# Patient Record
Sex: Male | Born: 1945 | Race: White | Hispanic: No | Marital: Married | Smoking: Never smoker
Health system: Southern US, Community
[De-identification: ages and names within clinical notes are randomized; demographics above are authoritative.]

## PROBLEM LIST (undated history)

## (undated) ENCOUNTER — Emergency Department (HOSPITAL_COMMUNITY): Admission: EM | Payer: No Typology Code available for payment source | Source: Home / Self Care

## (undated) DIAGNOSIS — M545 Low back pain, unspecified: Secondary | ICD-10-CM

## (undated) DIAGNOSIS — E7800 Pure hypercholesterolemia, unspecified: Secondary | ICD-10-CM

## (undated) DIAGNOSIS — R2689 Other abnormalities of gait and mobility: Secondary | ICD-10-CM

## (undated) DIAGNOSIS — F419 Anxiety disorder, unspecified: Secondary | ICD-10-CM

## (undated) DIAGNOSIS — G20A1 Parkinson's disease without dyskinesia, without mention of fluctuations: Secondary | ICD-10-CM

## (undated) DIAGNOSIS — L0232 Furuncle of buttock: Secondary | ICD-10-CM

## (undated) HISTORY — DX: Anxiety disorder, unspecified: F41.9

## (undated) HISTORY — DX: Low back pain, unspecified: M54.50

## (undated) HISTORY — DX: Other abnormalities of gait and mobility: R26.89

## (undated) HISTORY — DX: Furuncle of buttock: L02.32

---

## 2004-12-05 LAB — CONVERTED CEMR LAB: PSA: 0.99 ng/mL

## 2006-06-30 ENCOUNTER — Ambulatory Visit: Payer: Self-pay | Admitting: Family Medicine

## 2006-06-30 DIAGNOSIS — M545 Low back pain, unspecified: Secondary | ICD-10-CM | POA: Insufficient documentation

## 2006-06-30 DIAGNOSIS — K5730 Diverticulosis of large intestine without perforation or abscess without bleeding: Secondary | ICD-10-CM | POA: Insufficient documentation

## 2006-06-30 DIAGNOSIS — E785 Hyperlipidemia, unspecified: Secondary | ICD-10-CM | POA: Insufficient documentation

## 2006-06-30 DIAGNOSIS — L821 Other seborrheic keratosis: Secondary | ICD-10-CM | POA: Insufficient documentation

## 2006-06-30 LAB — CONVERTED CEMR LAB: Cholesterol, target level: 200 mg/dL

## 2006-09-11 ENCOUNTER — Ambulatory Visit: Payer: Self-pay | Admitting: Family Medicine

## 2006-09-11 ENCOUNTER — Ambulatory Visit (HOSPITAL_COMMUNITY): Admission: RE | Admit: 2006-09-11 | Discharge: 2006-09-11 | Payer: Self-pay | Admitting: Family Medicine

## 2006-09-12 ENCOUNTER — Encounter (INDEPENDENT_AMBULATORY_CARE_PROVIDER_SITE_OTHER): Payer: Self-pay | Admitting: Family Medicine

## 2006-09-12 DIAGNOSIS — D568 Other thalassemias: Secondary | ICD-10-CM | POA: Insufficient documentation

## 2006-09-12 LAB — CONVERTED CEMR LAB
ALT: 22 units/L (ref 0–53)
AST: 12 units/L (ref 0–37)
Alkaline Phosphatase: 77 units/L (ref 39–117)
Basophils Absolute: 0 10*3/uL (ref 0.0–0.1)
Basophils Relative: 1 % (ref 0–1)
Eosinophils Absolute: 0.1 10*3/uL (ref 0.0–0.7)
Eosinophils Relative: 2 % (ref 0–5)
HCT: 34.6 % — ABNORMAL LOW (ref 39.0–52.0)
LDL Cholesterol: 100 mg/dL — ABNORMAL HIGH (ref 0–99)
Lymphocytes Relative: 35 % (ref 12–46)
MCV: 62.8 fL — ABNORMAL LOW (ref 78.0–100.0)
PSA: 0.85 ng/mL (ref 0.10–4.00)
Platelets: 198 10*3/uL (ref 150–400)
RDW: 16 % — ABNORMAL HIGH (ref 11.5–14.0)
Sodium: 141 meq/L (ref 135–145)
Total Bilirubin: 0.9 mg/dL (ref 0.3–1.2)
Total Protein: 7.4 g/dL (ref 6.0–8.3)
VLDL: 19 mg/dL (ref 0–40)

## 2006-09-23 ENCOUNTER — Ambulatory Visit: Payer: Self-pay | Admitting: Family Medicine

## 2006-09-23 DIAGNOSIS — T61781A Other shellfish poisoning, accidental (unintentional), initial encounter: Secondary | ICD-10-CM | POA: Insufficient documentation

## 2006-09-23 DIAGNOSIS — T6191XA Toxic effect of unspecified seafood, accidental (unintentional), initial encounter: Secondary | ICD-10-CM | POA: Insufficient documentation

## 2006-09-29 ENCOUNTER — Telehealth (INDEPENDENT_AMBULATORY_CARE_PROVIDER_SITE_OTHER): Payer: Self-pay | Admitting: Family Medicine

## 2006-09-30 ENCOUNTER — Ambulatory Visit: Payer: Self-pay | Admitting: Family Medicine

## 2007-03-17 ENCOUNTER — Ambulatory Visit: Payer: Self-pay | Admitting: Family Medicine

## 2007-03-18 ENCOUNTER — Encounter (INDEPENDENT_AMBULATORY_CARE_PROVIDER_SITE_OTHER): Payer: Self-pay | Admitting: Family Medicine

## 2007-03-18 ENCOUNTER — Telehealth (INDEPENDENT_AMBULATORY_CARE_PROVIDER_SITE_OTHER): Payer: Self-pay | Admitting: *Deleted

## 2007-03-18 LAB — CONVERTED CEMR LAB
ALT: 19 units/L (ref 0–53)
Alkaline Phosphatase: 81 units/L (ref 39–117)
Creatinine, Ser: 1.03 mg/dL (ref 0.40–1.50)
Glucose, Bld: 91 mg/dL (ref 70–99)
LDL Cholesterol: 91 mg/dL (ref 0–99)
Sodium: 140 meq/L (ref 135–145)
Total Bilirubin: 0.5 mg/dL (ref 0.3–1.2)
Total CHOL/HDL Ratio: 4.7
Total Protein: 7.8 g/dL (ref 6.0–8.3)
Triglycerides: 204 mg/dL — ABNORMAL HIGH (ref <150)
VLDL: 41 mg/dL — ABNORMAL HIGH (ref 0–40)

## 2007-10-12 ENCOUNTER — Ambulatory Visit: Payer: Self-pay | Admitting: Family Medicine

## 2007-10-13 ENCOUNTER — Encounter (INDEPENDENT_AMBULATORY_CARE_PROVIDER_SITE_OTHER): Payer: Self-pay | Admitting: Family Medicine

## 2007-10-13 LAB — CONVERTED CEMR LAB
ALT: 13 units/L (ref 0–53)
AST: 8 units/L (ref 0–37)
Albumin: 4.4 g/dL (ref 3.5–5.2)
Basophils Absolute: 0 10*3/uL (ref 0.0–0.1)
CO2: 21 meq/L (ref 19–32)
Calcium: 9.2 mg/dL (ref 8.4–10.5)
Chloride: 106 meq/L (ref 96–112)
Cholesterol: 191 mg/dL (ref 0–200)
Creatinine, Ser: 1 mg/dL (ref 0.40–1.50)
Hemoglobin: 11.3 g/dL — ABNORMAL LOW (ref 13.0–17.0)
Lymphocytes Relative: 33 % (ref 12–46)
Monocytes Absolute: 0.4 10*3/uL (ref 0.1–1.0)
Neutro Abs: 3.1 10*3/uL (ref 1.7–7.7)
Neutrophils Relative %: 58 % (ref 43–77)
PSA: 1.42 ng/mL (ref 0.10–4.00)
Potassium: 4.5 meq/L (ref 3.5–5.3)
RDW: 15.8 % — ABNORMAL HIGH (ref 11.5–15.5)
Sodium: 140 meq/L (ref 135–145)
TSH: 2.665 microintl units/mL (ref 0.350–4.50)
Total Protein: 7.8 g/dL (ref 6.0–8.3)

## 2007-10-20 ENCOUNTER — Telehealth (INDEPENDENT_AMBULATORY_CARE_PROVIDER_SITE_OTHER): Payer: Self-pay | Admitting: Family Medicine

## 2008-02-01 ENCOUNTER — Ambulatory Visit: Payer: Self-pay | Admitting: Family Medicine

## 2008-02-01 DIAGNOSIS — L0201 Cutaneous abscess of face: Secondary | ICD-10-CM | POA: Insufficient documentation

## 2008-02-01 DIAGNOSIS — L03211 Cellulitis of face: Secondary | ICD-10-CM | POA: Insufficient documentation

## 2008-02-04 ENCOUNTER — Telehealth (INDEPENDENT_AMBULATORY_CARE_PROVIDER_SITE_OTHER): Payer: Self-pay | Admitting: *Deleted

## 2008-10-03 ENCOUNTER — Telehealth (INDEPENDENT_AMBULATORY_CARE_PROVIDER_SITE_OTHER): Payer: Self-pay | Admitting: *Deleted

## 2008-11-14 ENCOUNTER — Telehealth (INDEPENDENT_AMBULATORY_CARE_PROVIDER_SITE_OTHER): Payer: Self-pay | Admitting: Family Medicine

## 2008-11-14 ENCOUNTER — Encounter (INDEPENDENT_AMBULATORY_CARE_PROVIDER_SITE_OTHER): Payer: Self-pay | Admitting: *Deleted

## 2008-11-15 ENCOUNTER — Encounter (INDEPENDENT_AMBULATORY_CARE_PROVIDER_SITE_OTHER): Payer: Self-pay | Admitting: Family Medicine

## 2008-11-17 LAB — CONVERTED CEMR LAB
AST: 9 units/L (ref 0–37)
Albumin: 4.4 g/dL (ref 3.5–5.2)
Alkaline Phosphatase: 64 units/L (ref 39–117)
BUN: 19 mg/dL (ref 6–23)
Basophils Absolute: 0 10*3/uL (ref 0.0–0.1)
Calcium: 9.1 mg/dL (ref 8.4–10.5)
Chloride: 104 meq/L (ref 96–112)
Eosinophils Relative: 1 % (ref 0–5)
Glucose, Bld: 87 mg/dL (ref 70–99)
HCT: 35.2 % — ABNORMAL LOW (ref 39.0–52.0)
HDL: 43 mg/dL (ref >39)
Hemoglobin: 10.8 g/dL — ABNORMAL LOW (ref 13.0–17.0)
LDL Cholesterol: 118 mg/dL — ABNORMAL HIGH (ref 0–99)
Lymphocytes Relative: 37 % (ref 12–46)
Lymphs Abs: 2.2 10*3/uL (ref 0.7–4.0)
Monocytes Absolute: 0.5 10*3/uL (ref 0.1–1.0)
Neutro Abs: 3.2 10*3/uL (ref 1.7–7.7)
Potassium: 4.4 meq/L (ref 3.5–5.3)
RBC: 5.47 M/uL (ref 4.22–5.81)
Sodium: 140 meq/L (ref 135–145)
Total Protein: 7.4 g/dL (ref 6.0–8.3)
WBC: 5.9 10*3/uL (ref 4.0–10.5)

## 2012-02-26 HISTORY — PX: CYST REMOVAL LEG: SHX6280

## 2015-11-30 ENCOUNTER — Ambulatory Visit: Payer: Self-pay | Admitting: Surgery

## 2015-11-30 NOTE — H&P (Signed)
History of Present Illness Tony [name]. Tony Noyce MD; 11/30/2015 12:19 PM) Patient words: lipoma.  The patient is a 70 year old male who presents with a complaint of Mass. Referred by Dr. Berneta Sages for left thigh soft tissue mass Derm - Dr. Fontaine No  This is a 70 year old male who presents with a 20 year history of a small subcutaneous mass in the proximal left thigh. This never really bothered him so he left it alone. However over the last year or so, this has enlarged significantly. It causes some mild discomfort. He remains fairly soft to palpation. The patient however, is concerned about the enlargement of the mass. The patient is now referred to discuss excision of the mass for biopsy. No imaging has been performed.   Other Problems (Ammie Eversole, LPN; 624THL QA348G PM) Back Pain Hemorrhoids  Past Surgical History (Ammie Eversole, LPN; 624THL QA348G PM) Oral Surgery  Diagnostic Studies History (Ammie Eversole, LPN; 624THL QA348G PM) Colonoscopy 1-5 years ago  Allergies (Ammie Eversole, LPN; 624THL 579FGE PM) No Known Drug Allergies 11/29/2015  Medication History (Ammie Eversole, LPN; 624THL 579FGE PM) Atorvastatin Calcium (20MG  Tablet, Oral) Active. Medications Reconciled  Social History (Ammie Eversole, LPN; 624THL QA348G PM) Alcohol use Occasional alcohol use. Caffeine use Carbonated beverages. No drug use Tobacco use Never smoker.  Family History (Aleatha Borer, LPN; 624THL QA348G PM) Arthritis Mother. Bleeding disorder Mother. Depression Father. Diabetes Mellitus Family Members In General. Hypertension Father. Kidney Disease Father. Migraine Headache Family Members In General. Thyroid problems Sister.     Review of Systems (Ammie Eversole LPN; 624THL QA348G PM) General Not Present- Appetite Loss, Chills, Fatigue, Fever, Night Sweats, Weight Gain and Weight Loss. Skin Present- Change in Wart/Mole. Not Present- Dryness, Hives,  Jaundice, New Lesions, Non-Healing Wounds, Rash and Ulcer. HEENT Not Present- Earache, Hearing Loss, Hoarseness, Nose Bleed, Oral Ulcers, Ringing in the Ears, Seasonal Allergies, Sinus Pain, Sore Throat, Visual Disturbances, Wears glasses/contact lenses and Yellow Eyes. Respiratory Not Present- Bloody sputum, Chronic Cough, Difficulty Breathing, Snoring and Wheezing. Breast Not Present- Breast Mass, Breast Pain, Nipple Discharge and Skin Changes. Cardiovascular Not Present- Chest Pain, Difficulty Breathing Lying Down, Leg Cramps, Palpitations, Rapid Heart Rate, Shortness of Breath and Swelling of Extremities. Gastrointestinal Not Present- Abdominal Pain, Bloating, Bloody Stool, Change in Bowel Habits, Chronic diarrhea, Constipation, Difficulty Swallowing, Excessive gas, Gets full quickly at meals, Hemorrhoids, Indigestion, Nausea, Rectal Pain and Vomiting. Male Genitourinary Not Present- Blood in Urine, Change in Urinary Stream, Frequency, Impotence, Nocturia, Painful Urination, Urgency and Urine Leakage. Musculoskeletal Present- Back Pain. Not Present- Joint Pain, Joint Stiffness, Muscle Pain, Muscle Weakness and Swelling of Extremities. Neurological Present- Trouble walking. Not Present- Decreased Memory, Fainting, Headaches, Numbness, Seizures, Tingling, Tremor and Weakness. Psychiatric Not Present- Anxiety, Bipolar, Change in Sleep Pattern, Depression, Fearful and Frequent crying. Endocrine Not Present- Cold Intolerance, Excessive Hunger, Hair Changes, Heat Intolerance, Hot flashes and New Diabetes. Hematology Not Present- Blood Thinners, Easy Bruising, Excessive bleeding, Gland problems, HIV and Persistent Infections.  Vitals (Ammie Eversole LPN; 624THL 579FGE PM) 11/29/2015 2:11 PM Weight: 216.2 lb Height: 71.5in Body Surface Area: 2.19 m Body Mass Index: 29.73 kg/m  Temp.: 98.68F(Oral)  Pulse: 90 (Regular)  BP: 130/72 (Sitting, Left Arm, Standard)      Physical Exam  Rodman Key K. Kalisa Girtman MD; 11/30/2015 12:20 PM)  The physical exam findings are as follows: Note:WDWN in NAD Eyes: Pupils equal, round; sclera anicteric HENT: Oral mucosa moist; good dentition Neck: No masses palpated, no thyromegaly Lungs: CTA bilaterally; normal respiratory effort  CV: Regular rate and rhythm; no murmurs; extremities well-perfused with no edema Abd: +bowel sounds, soft, non-tender, no palpable organomegaly; no palpable hernias Skin: Warm, dry; no sign of jaundice Proximal medial left thigh just below grow - 8 x 4 cm protruding subcutaneous soft tissue mass; bilobed; no skin changes; no drainage; no inflammation; smooth, not mobile Psychiatric - alert and oriented x 4; calm mood and affect    Assessment & Plan Rodman Key K. Quartez Lagos MD; 11/29/2015 2:48 PM)  SOFT TISSUE MASS (M79.9) Impression: Proximal medial left thigh - 4 x 8 cm; subcutaneous  Current Plans Schedule for Surgery - Excision of subcutaneous soft tissue mass - proximal left thigh. The surgical procedure has been discussed with the patient. Potential risks, benefits, alternative treatments, and expected outcomes have been explained. All of the patient's questions at this time have been answered. The likelihood of reaching the patient's treatment goal is good. The patient understand the proposed surgical procedure and wishes to proceed.  Tony [name]. Georgette Dover, MD, [facility] Surgery  General/ Trauma Surgery  11/30/2015 12:21 PM

## 2019-12-14 ENCOUNTER — Emergency Department (HOSPITAL_BASED_OUTPATIENT_CLINIC_OR_DEPARTMENT_OTHER): Payer: Medicare Other

## 2019-12-14 ENCOUNTER — Emergency Department (HOSPITAL_BASED_OUTPATIENT_CLINIC_OR_DEPARTMENT_OTHER)
Admission: EM | Admit: 2019-12-14 | Discharge: 2019-12-14 | Disposition: A | Discharge status: Home / Self Care | Payer: Medicare Other | Attending: Emergency Medicine | Admitting: Emergency Medicine

## 2019-12-14 ENCOUNTER — Encounter (HOSPITAL_BASED_OUTPATIENT_CLINIC_OR_DEPARTMENT_OTHER): Payer: Self-pay | Admitting: *Deleted

## 2019-12-14 ENCOUNTER — Other Ambulatory Visit: Payer: Self-pay

## 2019-12-14 DIAGNOSIS — W19XXXA Unspecified fall, initial encounter: Secondary | ICD-10-CM

## 2019-12-14 DIAGNOSIS — E041 Nontoxic single thyroid nodule: Secondary | ICD-10-CM

## 2019-12-14 DIAGNOSIS — Z23 Encounter for immunization: Secondary | ICD-10-CM | POA: Insufficient documentation

## 2019-12-14 DIAGNOSIS — S0081XA Abrasion of other part of head, initial encounter: Secondary | ICD-10-CM | POA: Insufficient documentation

## 2019-12-14 DIAGNOSIS — S161XXA Strain of muscle, fascia and tendon at neck level, initial encounter: Secondary | ICD-10-CM

## 2019-12-14 DIAGNOSIS — W010XXA Fall on same level from slipping, tripping and stumbling without subsequent striking against object, initial encounter: Secondary | ICD-10-CM | POA: Diagnosis not present

## 2019-12-14 DIAGNOSIS — S0083XA Contusion of other part of head, initial encounter: Secondary | ICD-10-CM | POA: Diagnosis not present

## 2019-12-14 DIAGNOSIS — S0990XA Unspecified injury of head, initial encounter: Secondary | ICD-10-CM

## 2019-12-14 DIAGNOSIS — Y9301 Activity, walking, marching and hiking: Secondary | ICD-10-CM | POA: Insufficient documentation

## 2019-12-14 DIAGNOSIS — T148XXA Other injury of unspecified body region, initial encounter: Secondary | ICD-10-CM

## 2019-12-14 DIAGNOSIS — S0031XA Abrasion of nose, initial encounter: Secondary | ICD-10-CM | POA: Insufficient documentation

## 2019-12-14 HISTORY — DX: Pure hypercholesterolemia, unspecified: E78.00

## 2019-12-14 LAB — BASIC METABOLIC PANEL
Anion gap: 10 (ref 5–15)
BUN: 14 mg/dL (ref 8–23)
CO2: 23 mmol/L (ref 22–32)
Calcium: 9.2 mg/dL (ref 8.9–10.3)
Chloride: 102 mmol/L (ref 98–111)
Creatinine, Ser: 0.99 mg/dL (ref 0.61–1.24)
GFR, Estimated: >60 mL/min (ref >60)
Glucose, Bld: 96 mg/dL (ref 70–99)
Potassium: 4.3 mmol/L (ref 3.5–5.1)
Sodium: 135 mmol/L (ref 135–145)

## 2019-12-14 LAB — CBC WITH DIFFERENTIAL/PLATELET
Abs Immature Granulocytes: 0.02 10*3/uL (ref 0.00–0.07)
Basophils Absolute: 0 10*3/uL (ref 0.0–0.1)
Basophils Relative: 0 %
Eosinophils Absolute: 0 10*3/uL (ref 0.0–0.5)
Eosinophils Relative: 1 %
HCT: 35.9 % — ABNORMAL LOW (ref 39.0–52.0)
Hemoglobin: 11.3 g/dL — ABNORMAL LOW (ref 13.0–17.0)
Immature Granulocytes: 0 %
Lymphocytes Relative: 17 %
Lymphs Abs: 1.3 10*3/uL (ref 0.7–4.0)
MCH: 20.1 pg — ABNORMAL LOW (ref 26.0–34.0)
MCHC: 31.5 g/dL (ref 30.0–36.0)
MCV: 63.9 fL — ABNORMAL LOW (ref 80.0–100.0)
Monocytes Absolute: 0.6 10*3/uL (ref 0.1–1.0)
Monocytes Relative: 8 %
Neutro Abs: 5.5 10*3/uL (ref 1.7–7.7)
Neutrophils Relative %: 74 %
Platelets: 193 10*3/uL (ref 150–400)
RBC: 5.62 MIL/uL (ref 4.22–5.81)
RDW: 15.5 % (ref 11.5–15.5)
WBC: 7.4 10*3/uL (ref 4.0–10.5)
nRBC: 0 % (ref 0.0–0.2)

## 2019-12-14 MED ORDER — TETANUS-DIPHTH-ACELL PERTUSSIS 5-2.5-18.5 LF-MCG/0.5 IM SUSP
0.5000 mL | Freq: Once | INTRAMUSCULAR | Status: AC
  Administered 2019-12-14: 0.5 mL via INTRAMUSCULAR
  Filled 2019-12-14: qty 0.5

## 2019-12-14 NOTE — Discharge Instructions (Signed)
You were seen in the emergency department for evaluation of injuries from a fall.  You had a CAT scan of your head and face along with your cervical spine.  There was an incidental finding of a thyroid nodule.  This will need follow-up with your regular doctor.  Please discuss with your doctor stopping your Lexapro.  It potentially as a cause of your dizziness.  Return to the emergency department with any worsening or concerning symptoms

## 2019-12-14 NOTE — ED Provider Notes (Signed)
[facility] EMERGENCY DEPARTMENT Provider Note   CSN: [id number] Arrival date & time: 12/14/19  1052     History Chief Complaint  Patient presents with  . Fall    Tony [name] is a 74 y.o. male.  He is here for evaluation of injuries from a fall that happened yesterday.  He said since he started generic form of Lexapro he has had dizziness.  He felt dizzy lightheaded while he was walking yesterday stumbled and landed on the ground striking his face on the dark.  There was no loss of consciousness.  EMS evaluated him and did not think he needed to come to the hospital.  He talked to his doctor today and told him about his fall and facial injuries and right lateral neck pain and was recommended that he come to the emergency department to get a CT.  No headache blurry vision double vision numbness weakness.  No chest pain or shortness of breath.  The history is provided by the patient.  Fall This is a new problem. The current episode started yesterday. The problem has not changed since onset.Pertinent negatives include no chest pain, no abdominal pain, no headaches and no shortness of breath. Associated symptoms comments: Right neck pain. The symptoms are aggravated by bending and twisting. Nothing relieves the symptoms. He has tried acetaminophen for the symptoms. The treatment provided mild relief.       Past Medical History:  Diagnosis Date  . High cholesterol     Patient Active Problem List   Diagnosis Date Noted  . CELLULITIS AND ABSCESS OF FACE 02/01/2008  . [place] ALLERGY 09/23/2006  . 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 HYPERLIPIDEMIA 06/30/2006  . DIVERTICULOSIS, COLON 06/30/2006  . 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 BACK PAIN, LUMBAR 06/30/2006    History reviewed. No pertinent surgical history.     No family history on file.  Social History   Tobacco Use  . Smoking status: Never Smoker  . Smokeless tobacco: Current User  Substance Use Topics  .  Alcohol use: Yes  . Drug use: Never    Home Medications Prior to Admission medications   Medication Sig Start Date End Date Taking? Authorizing Provider  atorvastatin (LIPITOR) 10 MG tablet Take 10 mg by mouth daily.   Yes [provider]  escitalopram (LEXAPRO) 10 MG tablet Take 10 mg by mouth daily.   Yes [provider]    Allergies    Patient has no known allergies.  Review of Systems   Review of Systems  Constitutional: Negative for fever.  HENT: Positive for facial swelling. Negative for sore throat.   Eyes: Negative for visual disturbance.  Respiratory: Negative for shortness of breath.   Cardiovascular: Negative for chest pain.  Gastrointestinal: Negative for abdominal pain.  Genitourinary: Negative for dysuria.  Musculoskeletal: Positive for neck pain.  Skin: Positive for wound. Negative for rash.  Neurological: Negative for headaches.    Physical Exam Updated Vital Signs BP 133/84   Pulse 89   Temp 97.7 F (36.5 C) (Oral)   Resp 20   Ht 6' (1.829 m)   Wt 95.3 kg   SpO2 97%   BMI 28.48 kg/m   Physical Exam Vitals and nursing note reviewed.  Constitutional:      Appearance: Normal appearance. He is well-developed.  HENT:     Head: Normocephalic.     Comments: He has some abrasions and tenderness over his nose, left cheek and above his left eye.  No crepitus.  No nose bleed or septal hematoma.  Nose is swollen.  No malocclusion. Eyes:     Conjunctiva/sclera: Conjunctivae normal.  Neck:     Comments: He has no midline cervical spine tenderness.  He does have right paracervical tenderness Cardiovascular:     Rate and Rhythm: Normal rate and regular rhythm.     Heart sounds: No murmur heard.   Pulmonary:     Effort: Pulmonary effort is normal. No respiratory distress.     Breath sounds: Normal breath sounds.  Abdominal:     Palpations: Abdomen is soft.     Tenderness: There is no abdominal tenderness.  Musculoskeletal:         General: No deformity. Normal range of motion.     Cervical back: Neck supple.  Skin:    General: Skin is warm and dry.  Neurological:     General: No focal deficit present.     Mental Status: He is alert.     ED Results / Procedures / Treatments   Labs (all labs ordered are listed, but only abnormal results are displayed) Labs Reviewed  CBC WITH DIFFERENTIAL/PLATELET - Abnormal; Notable for the following components:      Result Value   Hemoglobin 11.3 (*)    HCT 35.9 (*)    MCV 63.9 (*)    MCH 20.1 (*)    All other components within normal limits  BASIC METABOLIC PANEL    EKG EKG Interpretation  Date/Time:  Tuesday December 14 2019 11:12:23 EDT Ventricular Rate:  72 PR Interval:  164 QRS Duration: 88 QT Interval:  400 QTC Calculation: 438 R Axis:   15 Text Interpretation: Normal sinus rhythm Normal ECG No old tracing to compare Confirmed by Aletta Edouard [phone]) on 12/14/2019 11:23:50 AM   Radiology CT Head Wo Contrast  Result Date: 12/14/2019 CLINICAL DATA:  Facial injury after fall yesterday.  Dizziness. EXAM: CT HEAD WITHOUT CONTRAST CT MAXILLOFACIAL WITHOUT CONTRAST CT CERVICAL SPINE WITHOUT CONTRAST TECHNIQUE: Multidetector CT imaging of the head, cervical spine, and maxillofacial structures were performed using the standard protocol without intravenous contrast. Multiplanar CT image reconstructions of the cervical spine and maxillofacial structures were also generated. COMPARISON:  None. FINDINGS: CT HEAD FINDINGS Brain: No evidence of acute infarction, hemorrhage, hydrocephalus, extra-axial collection or mass lesion/mass effect. Vascular: No hyperdense vessel or unexpected calcification. Skull: Normal. Negative for fracture or focal lesion. Other: None. CT MAXILLOFACIAL FINDINGS Osseous: No fracture or mandibular dislocation. No destructive process. Orbits: Negative. No traumatic or inflammatory finding. Sinuses: Minimal mucosal thickening is noted in the right  maxillary sinus. Remaining paranasal sinuses are unremarkable. Soft tissues: Negative. CT CERVICAL SPINE FINDINGS Alignment: Normal. Skull base and vertebrae: No acute fracture. No primary bone lesion or focal pathologic process. Soft tissues and spinal canal: No prevertebral fluid or swelling. No visible canal hematoma. 1.6 cm right thyroid nodule is noted. Disc levels: Moderate degenerative disc disease is noted at C5-6 and C6-7 with anterior osteophyte formation. Upper chest: Negative. Other: None. IMPRESSION: 1. Normal head CT. 2. No abnormality seen in maxillofacial region. 3. Moderate multilevel degenerative disc disease. No acute abnormality seen in the cervical spine. 4. 1.6 cm right thyroid nodule is noted. Recommend thyroid US. (Ref: J Am Coll Radiol. 2015 Feb;12(2): 143-50). Electronically Signed   By: Marijo Conception M.D.   On: 12/14/2019 12:23   CT Cervical Spine Wo Contrast  Result Date: 12/14/2019 CLINICAL DATA:  Facial injury after fall yesterday.  Dizziness. EXAM: CT HEAD WITHOUT CONTRAST CT  MAXILLOFACIAL WITHOUT CONTRAST CT CERVICAL SPINE WITHOUT CONTRAST TECHNIQUE: Multidetector CT imaging of the head, cervical spine, and maxillofacial structures were performed using the standard protocol without intravenous contrast. Multiplanar CT image reconstructions of the cervical spine and maxillofacial structures were also generated. COMPARISON:  None. FINDINGS: CT HEAD FINDINGS Brain: No evidence of acute infarction, hemorrhage, hydrocephalus, extra-axial collection or mass lesion/mass effect. Vascular: No hyperdense vessel or unexpected calcification. Skull: Normal. Negative for fracture or focal lesion. Other: None. CT MAXILLOFACIAL FINDINGS Osseous: No fracture or mandibular dislocation. No destructive process. Orbits: Negative. No traumatic or inflammatory finding. Sinuses: Minimal mucosal thickening is noted in the right maxillary sinus. Remaining paranasal sinuses are unremarkable. Soft  tissues: Negative. CT CERVICAL SPINE FINDINGS Alignment: Normal. Skull base and vertebrae: No acute fracture. No primary bone lesion or focal pathologic process. Soft tissues and spinal canal: No prevertebral fluid or swelling. No visible canal hematoma. 1.6 cm right thyroid nodule is noted. Disc levels: Moderate degenerative disc disease is noted at C5-6 and C6-7 with anterior osteophyte formation. Upper chest: Negative. Other: None. IMPRESSION: 1. Normal head CT. 2. No abnormality seen in maxillofacial region. 3. Moderate multilevel degenerative disc disease. No acute abnormality seen in the cervical spine. 4. 1.6 cm right thyroid nodule is noted. Recommend thyroid US. (Ref: J Am Coll Radiol. 2015 Feb;12(2): 143-50). Electronically Signed   By: Marijo Conception M.D.   On: 12/14/2019 12:23   CT Maxillofacial WO CM  Result Date: 12/14/2019 CLINICAL DATA:  Facial injury after fall yesterday.  Dizziness. EXAM: CT HEAD WITHOUT CONTRAST CT MAXILLOFACIAL WITHOUT CONTRAST CT CERVICAL SPINE WITHOUT CONTRAST TECHNIQUE: Multidetector CT imaging of the head, cervical spine, and maxillofacial structures were performed using the standard protocol without intravenous contrast. Multiplanar CT image reconstructions of the cervical spine and maxillofacial structures were also generated. COMPARISON:  None. FINDINGS: CT HEAD FINDINGS Brain: No evidence of acute infarction, hemorrhage, hydrocephalus, extra-axial collection or mass lesion/mass effect. Vascular: No hyperdense vessel or unexpected calcification. Skull: Normal. Negative for fracture or focal lesion. Other: None. CT MAXILLOFACIAL FINDINGS Osseous: No fracture or mandibular dislocation. No destructive process. Orbits: Negative. No traumatic or inflammatory finding. Sinuses: Minimal mucosal thickening is noted in the right maxillary sinus. Remaining paranasal sinuses are unremarkable. Soft tissues: Negative. CT CERVICAL SPINE FINDINGS Alignment: Normal. Skull base and  vertebrae: No acute fracture. No primary bone lesion or focal pathologic process. Soft tissues and spinal canal: No prevertebral fluid or swelling. No visible canal hematoma. 1.6 cm right thyroid nodule is noted. Disc levels: Moderate degenerative disc disease is noted at C5-6 and C6-7 with anterior osteophyte formation. Upper chest: Negative. Other: None. IMPRESSION: 1. Normal head CT. 2. No abnormality seen in maxillofacial region. 3. Moderate multilevel degenerative disc disease. No acute abnormality seen in the cervical spine. 4. 1.6 cm right thyroid nodule is noted. Recommend thyroid US. (Ref: J Am Coll Radiol. 2015 Feb;12(2): 143-50). Electronically Signed   By: Marijo Conception M.D.   On: 12/14/2019 12:23    Procedures Procedures (including critical care time)  Medications Ordered in ED Medications  Tdap (BOOSTRIX) injection 0.5 mL (has no administration in time range)    ED Course  I have reviewed the triage vital signs and the nursing notes.  Pertinent labs & imaging results that were available during my care of the patient were reviewed by me and considered in my medical decision making (see chart for details).  Clinical Course as of Dec 13 1745  Tue Dec 14, 2019  1230  IMPRESSION: 1. Normal head CT. 2. No abnormality seen in maxillofacial region. 3. Moderate multilevel degenerative disc disease. No acute abnormality seen in the cervical spine. 4. 1.6 cm right thyroid nodule is noted. Recommend thyroid US. (Ref: J Am Coll Radiol. 2015 Feb;12(2): 143-50).     [MB]  9563 Reviewed results with patient.  He is comfortable with plan for outpatient follow-up with his PCP regarding thyroid nodule.   [MB]    Clinical Course User Index [MB] Hayden Rasmussen, MD   MDM Rules/Calculators/A&P                         This patient complains of dizziness and fall, facial injuries, neck pain; this involves an extensive number of treatment Options and is a complaint that carries with it  a high risk of complications and Morbidity. The differential includes head bleed, skull fracture, cervical fracture, facial fractures, arrhythmia, anemia, metabolic derangement  I ordered, reviewed and interpreted labs, which included CBC with normal Lisa count, hemoglobin low but stable, chemistries normal electrolytes and renal function I ordered medication tetanus update I ordered imaging studies which included CT head max face and cervical spine and I independently    visualized and interpreted imaging which showed no acute fractures.  Radiology comments upon a incidental thyroid nodule which I reviewed with patient Additional history obtained from patient significant other Previous records obtained and reviewed in epic, no recent visits  After the interventions stated above, I reevaluated the patient and found patient to be currently asymptomatic.  Reviewed results of his work-up with him.  Recommended he contact his primary care doctor for follow-up on the thyroid nodule and discussion of stopping his Lexapro.  He is comfortable plan for discharge and return instructions discussed.   Final Clinical Impression(s) / ED Diagnoses Final diagnoses:  Fall, initial encounter  Injury of head, initial encounter  Contusion of face, initial encounter  Abrasion  Thyroid nodule  Strain of neck muscle, initial encounter    Rx / DC Orders ED Discharge Orders    None       Hayden Rasmussen, MD 12/14/19 1750

## 2019-12-14 NOTE — ED Triage Notes (Signed)
He was started on Lexapro 10 mg 30 days ago. He has been dizzy during his daily walks since started the medication. Yesterday during his walk he stumbled and fell landing on his face. No LOC.  Abrasion to the bridge of his nose. He is ambulatory.

## 2020-01-13 ENCOUNTER — Other Ambulatory Visit: Payer: Self-pay | Admitting: Internal Medicine

## 2020-01-13 DIAGNOSIS — E041 Nontoxic single thyroid nodule: Secondary | ICD-10-CM

## 2020-01-25 ENCOUNTER — Ambulatory Visit
Admission: RE | Admit: 2020-01-25 | Discharge: 2020-01-25 | Disposition: A | Discharge status: Home / Self Care | Payer: Medicare Other | Source: Ambulatory Visit | Attending: Internal Medicine | Admitting: Internal Medicine

## 2020-01-25 DIAGNOSIS — E041 Nontoxic single thyroid nodule: Secondary | ICD-10-CM

## 2020-03-30 ENCOUNTER — Other Ambulatory Visit: Payer: Self-pay

## 2020-03-30 ENCOUNTER — Encounter: Payer: Self-pay | Admitting: Neurology

## 2020-03-30 ENCOUNTER — Ambulatory Visit: Payer: Medicare Other | Admitting: Neurology

## 2020-03-30 ENCOUNTER — Telehealth: Payer: Self-pay | Admitting: Neurology

## 2020-03-30 VITALS — BP 127/84 | HR 85 | Ht 72.0 in | Wt 215.0 lb

## 2020-03-30 DIAGNOSIS — G20 Parkinson's disease: Secondary | ICD-10-CM | POA: Insufficient documentation

## 2020-03-30 DIAGNOSIS — R269 Unspecified abnormalities of gait and mobility: Secondary | ICD-10-CM | POA: Insufficient documentation

## 2020-03-30 DIAGNOSIS — G20C Parkinsonism, unspecified: Secondary | ICD-10-CM

## 2020-03-30 DIAGNOSIS — R413 Other amnesia: Secondary | ICD-10-CM | POA: Diagnosis not present

## 2020-03-30 MED ORDER — ALPRAZOLAM 1 MG PO TABS: ORAL_TABLET | ORAL | 0 refills | Status: DC

## 2020-03-30 MED ORDER — CARBIDOPA-LEVODOPA 25-100 MG PO TABS: 1.0000 | ORAL_TABLET | Freq: Three times a day (TID) | ORAL | 6 refills | Status: DC

## 2020-03-30 NOTE — Telephone Encounter (Signed)
[employer] medicare order sent to GI. No auth they will reach out to the patient to schedule.  

## 2020-03-30 NOTE — Progress Notes (Signed)
Chief Complaint  Patient presents with  . New Patient (Initial Visit)    Referred for evaluation of movement issues. ED visit from fall on 11/2018. He is here with his wife, Archie Patten. Reports having issues with his dexterity, anxiety, depth perception, imbalance. He is having a hard time with buttoning his shirt or driving for long distances. He only drives close to home now. He also has difficulty sleeping.    HISTORICAL  Tony [name] is a 75 year old male, seen in request by his primary care physician Dr. Dagmar Hait, Steva Ready for evaluation of memory loss, unsteady gait, initial evaluation was on March 30, 2020 with his wife,  I reviewed and summarized the referring note.  Past medical history Hyperlipidemia  He has been very active all his life, played basketball until age 89, since he moved to a different house in 2020, he become less active, he doesn't have to do his yard work anymore, had a gradual onset anxiety, especially noticeable since October 2021, he has difficulty sleeping, taking melatonin, also noticed difficulty driving, could not park properly, he was treated with Lexapro for a long time, dosage was increased to 5 to 10 mg, he complains of intolerable dizziness, has to back down to 5 mg again  He is still walking his neighborhood regularly, 1 to 2 miles each day, but he noticed mild difficulty walking, tripped easily, lost dexterity, was taken to emergency room October 19, he was doing his routine walk, without any clear triggers, he fell face down, he denied loss of consciousness, he does complain dizziness with sudden positional change, such as getting up from prolonged sitting position  Laboratory evaluation October 2021, hemoglobin of 11.9, normal BMP  I personally reviewed the head without contrast mild generalized atrophy, ventriculomegaly seems to be out of proportion to the generalized atrophy, CT of cervical spine, degenerative changes, no acute fracture  Wife also  noticed he has gradual onset of memory loss, Today's MoCA examination is only 20 out of 30, he has apparent visuospatial difficulties  REVIEW OF SYSTEMS: Full 14 system review of systems performed and notable only for as above All other review of systems were negative.  ALLERGIES: Allergies  Allergen Reactions  . Shellfish Allergy Hives and Swelling    HOME MEDICATIONS: Current Outpatient Medications  Medication Sig Dispense Refill  . atorvastatin (LIPITOR) 10 MG tablet Take 10 mg by mouth daily.    . Melatonin 10 MG TABS Take 1 tablet by mouth at bedtime.    . Multiple Vitamin (MULTI-VITAMIN DAILY PO) Take 1 tablet by mouth daily.     No current facility-administered medications for this visit.    PAST MEDICAL HISTORY: Past Medical History:  Diagnosis Date  . Anxiety   . High cholesterol   . Imbalance   . Low back pain     PAST SURGICAL HISTORY: Past Surgical History:  Procedure Laterality Date  . CYST REMOVAL LEG  2014    FAMILY HISTORY: Family History  Problem Relation Age of Onset  . Stroke Mother   . Heart disease Father   . Heart attack Paternal Grandfather     SOCIAL HISTORY: Social History   Socioeconomic History  . Marital status: Married    Spouse name: Not on file  . Number of children: 5  . Years of education: college  . Highest education level: Bachelor's degree (e.g., BA, AB, BS)  Occupational History  . Occupation: Retired  Tobacco Use  . Smoking status: Never Smoker  . Smokeless tobacco:  Current User  Substance and Sexual Activity  . Alcohol use: Yes    Comment: one beer weekly  . Drug use: Never  . Sexual activity: Not on file  Other Topics Concern  . Not on file  Social History Narrative   Lives at home with his wife.   Right-handed.   Caffeine use: recently discontinued daily use of [facility].   Social Determinants of Health   Financial Resource Strain: Not on file  Food Insecurity: Not on file  Transportation Needs: Not  on file  Physical Activity: Not on file  Stress: Not on file  Social Connections: Not on file  Intimate Partner Violence: Not on file     PHYSICAL EXAM   Vitals:   03/30/20 0748  BP: 127/84  Pulse: 85  Weight: 215 lb (97.5 kg)  Height: 6' (1.829 m)   Not recorded     Body mass index is 29.16 kg/m.  PHYSICAL EXAMNIATION:  Gen: NAD, conversant, well nourised, well groomed                     Cardiovascular: Regular rate rhythm, no peripheral edema, warm, nontender. Eyes: Conjunctivae clear without exudates or hemorrhage Neck: Supple, no carotid bruits. Pulmonary: Clear to auscultation bilaterally   NEUROLOGICAL EXAM:  MENTAL STATUS: Speech:    Speech is normal; fluent and spontaneous with normal comprehension.  Mildly decreased facial expression Cognition:     Montreal Cognitive Assessment  03/30/2020  Visuospatial/ Executive (0/5) 0  Naming (0/3) 3  Attention: Read list of digits (0/2) 2  Attention: Read list of letters (0/1) 1  Attention: Serial 7 subtraction starting at 100 (0/3) 2  Language: Repeat phrase (0/2) 1  Language : Fluency (0/1) 1  Abstraction (0/2) 2  Delayed Recall (0/5) 2  Orientation (0/6) 6  Total 20    CRANIAL NERVES: CN II: Visual fields are full to confrontation. Pupils are round equal and briskly reactive to light. CN III, IV, VI: Mildly decreased downward vertical movement, no ptosis. CN V: Facial sensation is intact to light touch CN VII: Face is symmetric with normal eye closure  CN VIII: Hearing is normal to causal conversation. CN IX, X: Phonation is normal. CN XI: Head turning and shoulder shrug are intact  MOTOR: Normal strengths, left more than right bradykinesia, rigidity, no resting tremor  REFLEXES: Reflexes are 2+ and symmetric at the biceps, triceps, knees, and ankles. Plantar responses are flexor.  SENSORY: Intact to light touch, pinprick and vibratory sensation are intact in fingers and  toes.  COORDINATION: There is no trunk or limb dysmetria noted.  GAIT/STANCE: Mildly decreased bilateral arm swing, left side is more obvious, moderate stride, smooth turning, mild retropulsion stability   DIAGNOSTIC DATA (LABS, IMAGING, TESTING) - I reviewed patient records, labs, notes, testing and imaging myself where available.   ASSESSMENT AND PLAN  Christhoper Busbee is a 75 y.o. male   Parkinsonism with memory loss,  Idiopathic Parkinson's disease with memory loss, versus other parkinsonism, such as Lewy body dementia  CT scan showed ventriculomegaly, out of proportion to generalized atrophy, proceed with MRI of the brain  Empirically treat with Sinemet 25/100 mg 3 times daily  Laboratory evaluation including B12  Return to clinic in 3 months  Marcial Pacas, M.D. Ph.D.  [facility] [address] [place], [place] [address] Ph: [phone] Fax: [phone]  CC:  Prince Solian, [place],  [place] [address]

## 2020-03-31 ENCOUNTER — Encounter: Payer: Self-pay | Admitting: Neurology

## 2020-03-31 LAB — RPR: RPR Ser Ql: NONREACTIVE

## 2020-03-31 LAB — VITAMIN B12: Vitamin B-12: 633 pg/mL (ref 232–1245)

## 2020-04-09 ENCOUNTER — Other Ambulatory Visit: Payer: Self-pay

## 2020-04-09 ENCOUNTER — Ambulatory Visit
Admission: RE | Admit: 2020-04-09 | Discharge: 2020-04-09 | Disposition: A | Discharge status: Home / Self Care | Payer: Medicare Other | Source: Ambulatory Visit | Attending: Neurology | Admitting: Neurology

## 2020-04-09 DIAGNOSIS — R269 Unspecified abnormalities of gait and mobility: Secondary | ICD-10-CM

## 2020-04-10 ENCOUNTER — Telehealth: Payer: Self-pay | Admitting: Neurology

## 2020-04-10 NOTE — Telephone Encounter (Signed)
  IMPRESSION: This MRI of the brain without contrast shows the following: 1.   Mild generalized cortical atrophy, probably normal for age.  Additionally there is mild cerebellar atrophy. 2.   Scattered T2/FLAIR hyperintense foci in the hemispheres consistent with mild chronic microvascular ischemic change. 3.   The right maxillary sinus is hypoplastic and shows mucoperiosteal thickening consistent with chronic sinusitis 4.   No acute findings.   Please call patient, MRI of the brain showed generalized atrophy, mild supratentorial and small vessel disease, there was no acute abnormalities.

## 2020-04-10 NOTE — Telephone Encounter (Signed)
Called and spoke with pt about results per Dr. Krista Blue note. He verbalized understanding. Reminded him that next appt is 07/06/20 at 10:30am.

## 2020-04-20 NOTE — Telephone Encounter (Signed)
Pt is asking for a call from Dr Rhea Belton RN to discuss his case.  Pt states he has not heard anything re: results to MRI other than mychart updates, please call.

## 2020-04-20 NOTE — Telephone Encounter (Signed)
He had MRI already, I also started him on Sinemet 25/100 mg 3 times daily  May give him an early follow-up visit to evaluate the response to treatment, review MRI, and have his questions answered  Encouraged him to continue titrating up carbidopa, if he has GI, side effect of dizziness, may start from half tablets 3 times a day, gradually titrating up to 1 tablet 3 times a day, might be easier on him if he take after each meal.

## 2020-04-20 NOTE — Telephone Encounter (Signed)
Called pt back. Went over MRI results that I spoke with him about on the phone 04/10/20. He verbalized understanding. He was wondering if Dr. Krista Blue has definitively diagnosed him with Parkinson's Disease? He is taking sinemet as prescribed and feels it has helped some with dizziness. Does not want to wait until May to know if he has PD or not. He is anxious about this.   He also asked for DMV parking placard application to be filled out. Advised we will complete this and call him once its ready to be picked up.

## 2020-04-20 NOTE — Telephone Encounter (Signed)
I spoke to the patient. We moved his follow up to Tuesday, March 1st. He is okay to wait and discuss his care plan and prognosis at that time with Dr. Krista Blue. He is taking the Sinemet 25/100mg , one tablet TID. He is not having any further issues with dizziness. He feels his movement is also some better since starting the medication.

## 2020-04-24 NOTE — Telephone Encounter (Signed)
He is aware of he can have both his wife and daughter at his appt.

## 2020-04-24 NOTE — Telephone Encounter (Signed)
Pt. is asking if his wife & daughter can come back in his appt. tomorrow. Please advise.

## 2020-04-25 ENCOUNTER — Encounter: Payer: Self-pay | Admitting: Neurology

## 2020-04-25 ENCOUNTER — Ambulatory Visit: Payer: Medicare Other | Admitting: Neurology

## 2020-04-25 VITALS — BP 132/74 | HR 80 | Ht 72.0 in | Wt 212.5 lb

## 2020-04-25 DIAGNOSIS — F419 Anxiety disorder, unspecified: Secondary | ICD-10-CM

## 2020-04-25 DIAGNOSIS — G20C Parkinsonism, unspecified: Secondary | ICD-10-CM

## 2020-04-25 DIAGNOSIS — G20 Parkinson's disease: Secondary | ICD-10-CM | POA: Diagnosis not present

## 2020-04-25 DIAGNOSIS — R413 Other amnesia: Secondary | ICD-10-CM

## 2020-04-25 MED ORDER — RASAGILINE MESYLATE 1 MG PO TABS: 1.0000 mg | ORAL_TABLET | Freq: Every day | ORAL | 11 refills | Status: DC

## 2020-04-25 MED ORDER — CITALOPRAM HYDROBROMIDE 10 MG PO TABS: 10.0000 mg | ORAL_TABLET | Freq: Every day | ORAL | 6 refills | Status: DC

## 2020-04-25 MED ORDER — CARBIDOPA-LEVODOPA 25-100 MG PO TABS
1.0000 | ORAL_TABLET | Freq: Three times a day (TID) | ORAL | 4 refills | Status: DC
Start: 2020-04-25 — End: 2021-07-04

## 2020-04-25 NOTE — Progress Notes (Signed)
Chief Complaint  Patient presents with  . Follow-up     Idiopathic Parkinson's disease with memory loss. Recent MMSE 20/30. He is here with his wife, Archie Patten and daughter. They would like to review his MRI brain and discuss his diagnosis.     HISTORICAL  Tony [name] is a 75 year old male, seen in request by his primary care physician Dr. Dagmar Hait, Steva Ready for evaluation of memory loss, unsteady gait, initial evaluation was on March 30, 2020 with his wife,  I reviewed and summarized the referring note.  Past medical history Hyperlipidemia  He has been very active all his life, played basketball until age 61, since he moved to a different house in 2020, he become less active, he doesn't have to do his yard work anymore, had a gradual onset anxiety, especially noticeable since October 2021, he has difficulty sleeping, taking melatonin, also noticed difficulty driving, could not park properly, he was treated with Lexapro for a long time, dosage was increased to 5 to 10 mg, he complains of intolerable dizziness, has to back down to 5 mg again   He is still walking his neighborhood regularly, 1 to 2 miles each day, but he noticed mild difficulty walking, tripped easily, lost dexterity, was taken to emergency room October 19, he was doing his routine walk, without any clear triggers, he fell face down, he denied loss of consciousness, he does complain dizziness with sudden positional change, such as getting up from prolonged sitting position  Laboratory evaluation October 2021, hemoglobin of 11.9, normal BMP  I personally reviewed the head without contrast mild generalized atrophy, ventriculomegaly seems to be out of proportion to the generalized atrophy, CT of cervical spine, degenerative changes, no acute fracture  Wife also noticed he has gradual onset of memory loss, Today's MoCA examination is only 20 out of 30, he has apparent visuospatial difficulties  UPDATE April 25 2020: He is  accompanied by his daughter and wife at today's clinical visit, tolerating Sinemet 25/100 mg, he is taking it at 7 AM, 12, 6 PM, with meal, tolerating medication well, did reported at least moderate improvement, he can walk better, no significant side effect  Personally reviewed MRI of the brain without contrast, moderate atrophy, mild supratentorial and small vessel disease, ventriculomegaly,  Laboratory evaluation February 2022, normal B12, RPR, BMP, CBC showed mildly decreased hemoglobin 11.3, is at his baseline, 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  ALLERGIES: Allergies  Allergen Reactions  . Shellfish Allergy Hives and Swelling    HOME MEDICATIONS: Current Outpatient Medications  Medication Sig Dispense Refill  . atorvastatin (LIPITOR) 10 MG tablet Take 10 mg by mouth daily.    . carbidopa-levodopa (SINEMET IR) 25-100 MG tablet Take 1 tablet by mouth 3 (three) times daily. 90 tablet 6  . Multiple Vitamin (MULTI-VITAMIN DAILY PO) Take 1 tablet by mouth daily.     No current facility-administered medications for this visit.    PAST MEDICAL HISTORY: Past Medical History:  Diagnosis Date  . Anxiety   . High cholesterol   . Imbalance   . Low back pain     PAST SURGICAL HISTORY: Past Surgical History:  Procedure Laterality Date  . CYST REMOVAL LEG  2014    FAMILY HISTORY: Family History  Problem Relation Age of Onset  .  Stroke Mother   . Heart disease Father   . Heart attack Paternal Grandfather     SOCIAL HISTORY: Social History   Socioeconomic History  . Marital status: Married    Spouse name: Not on file  . Number of children: 5  . Years of education: college  . Highest education level: Bachelor's degree (e.g., BA, AB, BS)  Occupational  History  . Occupation: Retired  Tobacco Use  . Smoking status: Never Smoker  . Smokeless tobacco: Current User  Substance and Sexual Activity  . Alcohol use: Yes    Comment: one beer weekly  . Drug use: Never  . Sexual activity: Not on file  Other Topics Concern  . Not on file  Social History Narrative   Lives at home with his wife.   Right-handed.   Caffeine use: recently discontinued daily use of [facility].   Social Determinants of Health   Financial Resource Strain: Not on file  Food Insecurity: Not on file  Transportation Needs: Not on file  Physical Activity: Not on file  Stress: Not on file  Social Connections: Not on file  Intimate Partner Violence: Not on file     PHYSICAL EXAM   Vitals:   04/25/20 1348  Weight: 212 lb 8 oz (96.4 kg)  Height: 6' (1.829 m)   Not recorded     Body mass index is 28.82 kg/m.  PHYSICAL EXAMNIATION:  Gen: NAD, conversant, well nourised, well groomed                     Cardiovascular: Regular rate rhythm, no peripheral edema, warm, nontender. Eyes: Conjunctivae clear without exudates or hemorrhage Neck: Supple, no carotid bruits. Pulmonary: Clear to auscultation bilaterally   NEUROLOGICAL EXAM:  MENTAL STATUS: Speech:    Speech is normal; fluent and spontaneous with normal comprehension.  Mildly decreased facial expression  Cognition:     Montreal Cognitive Assessment  03/30/2020  Visuospatial/ Executive (0/5) 0  Naming (0/3) 3  Attention: Read list of digits (0/2) 2  Attention: Read list of letters (0/1) 1  Attention: Serial 7 subtraction starting at 100 (0/3) 2  Language: Repeat phrase (0/2) 1  Language : Fluency (0/1) 1  Abstraction (0/2) 2  Delayed Recall (0/5) 2  Orientation (0/6) 6  Total 20    CRANIAL NERVES: CN II: Visual fields are full to confrontation. Pupils are round equal and briskly reactive to light. CN III, IV, VI: Mildly decreased downward vertical movement, no ptosis. CN V: Facial  sensation is intact to light touch CN VII: Face is symmetric with normal eye closure  CN VIII: Hearing is normal to causal conversation. CN IX, X: Phonation is normal. CN XI: Head turning and shoulder shrug are intact  MOTOR: Normal strengths, left more than right bradykinesia, rigidity, no resting tremor  REFLEXES: Reflexes are 2+ and symmetric at the biceps, triceps, knees, and ankles. Plantar responses are flexor.  SENSORY: Intact to light touch, pinprick and vibratory sensation are intact in fingers and toes.  COORDINATION: There is no trunk or limb dysmetria noted.  GAIT/STANCE: Can get up from seated position arm crossed, moderate stride, smooth turning, mildly decreased swing on the left side   DIAGNOSTIC DATA (LABS, IMAGING, TESTING) - I reviewed patient records, labs, notes, testing and imaging myself where available.   ASSESSMENT AND PLAN  Tony [name] is a 75 y.o. male   Parkinsonism with memory loss,  Idiopathic Parkinson's disease with memory loss, versus other Parkinson-like disease  MRI of the brain showed generalized atrophy, ventriculomegaly in the setting of significant brain atrophy, no clear evidence of normal pressure hydrocephalus mild supratentorium small vessel disease  He responded well to Sinemet 25/100 mg 3 times daily  Laboratory evaluation showed no treatable etiology  Continue moderate exercise  Will consider Azilect 1 mg daily at next visit in 4 months with Sarah anxiety anxiety Anxiety  Add on low-dose Celexa titrating to 10 mg daily  Marcial Pacas, M.D. Ph.D.  [facility] [address] [place], [place] [address] Ph: [phone] Fax: [phone]  CC:  Prince Solian, [place],  [place] [address]

## 2020-04-26 ENCOUNTER — Telehealth: Payer: Self-pay | Admitting: Neurology

## 2020-04-26 NOTE — Telephone Encounter (Signed)
Merry Proud from [employer] called & stated there is an interaction between citalopram (CELEXA) 10 MG tablet & rasagiline.   Best contact: [phone]

## 2020-04-26 NOTE — Telephone Encounter (Signed)
Dr. Krista Blue canceled the [place].. She sent prescriptions in for Celexa and Sinemet for the patient. He should only be taking these medications from her at this time.   I have returned the call to [facility] who will make sure the Azilect is voided completely off the patient's profile to avoid any confusion.

## 2020-06-15 ENCOUNTER — Telehealth: Payer: Self-pay

## 2020-06-15 NOTE — Telephone Encounter (Signed)
Pt called and left a vm at 1121 am asking for a call to confirm when his next appointment is. Pt reported in the message he is having difficulty getting on to mychart and cannot confirm.   Pt's best CB # [phone].

## 2020-06-15 NOTE — Telephone Encounter (Signed)
I called the patient and provided him with his next appt information. He will be seeing Butler Denmark, NP on 08/31/20 at 9:45am. He will arrive early for check-in.   He would also like help logging into his mychart acct. I provided him with the mychart help desk number [phone]).

## 2020-07-06 ENCOUNTER — Ambulatory Visit: Payer: Medicare Other | Admitting: Neurology

## 2020-08-22 ENCOUNTER — Telehealth: Payer: Self-pay | Admitting: Neurology

## 2020-08-22 NOTE — Telephone Encounter (Signed)
OV on 7/7 cancelled due to Judson Roch out-LVM & sent mychart message informing patient.

## 2020-08-24 ENCOUNTER — Other Ambulatory Visit: Payer: Self-pay

## 2020-08-24 ENCOUNTER — Encounter: Payer: Self-pay | Admitting: Neurology

## 2020-08-24 ENCOUNTER — Ambulatory Visit: Payer: Medicare Other | Admitting: Neurology

## 2020-08-24 VITALS — BP 125/82 | HR 84 | Ht 72.0 in | Wt 206.0 lb

## 2020-08-24 DIAGNOSIS — R269 Unspecified abnormalities of gait and mobility: Secondary | ICD-10-CM | POA: Diagnosis not present

## 2020-08-24 DIAGNOSIS — G20 Parkinson's disease: Secondary | ICD-10-CM | POA: Diagnosis not present

## 2020-08-24 DIAGNOSIS — R413 Other amnesia: Secondary | ICD-10-CM

## 2020-08-24 NOTE — Patient Instructions (Signed)
Order DAT scan for further evaluation of PD Continue Sinemet at current dosing Continue with your daily walking  See you back in 4 months

## 2020-08-24 NOTE — Progress Notes (Signed)
Chief Complaint  Patient presents with   Follow-up    Rm 15, alone, states he is better but c/o of dizziness in the morning, difficulty parking car, balance is off, difficult to button clothes, and speech problems      HISTORICAL  Tony [name] is a 75 year old male, seen in request by his primary care physician Dr. Dagmar Hait, Steva Ready for evaluation of memory loss, unsteady gait, initial evaluation was on March 30, 2020 with his wife,  I reviewed and summarized the referring note.  Past medical history Hyperlipidemia  He has been very active all his life, played basketball until age 23, since he moved to a different house in 2020, he become less active, he doesn't have to do his yard work anymore, had a gradual onset anxiety, especially noticeable since October 2021, he has difficulty sleeping, taking melatonin, also noticed difficulty driving, could not park properly, he was treated with Lexapro for a long time, dosage was increased to 5 to 10 mg, he complains of intolerable dizziness, has to back down to 5 mg again   He is still walking his neighborhood regularly, 1 to 2 miles each day, but he noticed mild difficulty walking, tripped easily, lost dexterity, was taken to emergency room October 19, he was doing his routine walk, without any clear triggers, he fell face down, he denied loss of consciousness, he does complain dizziness with sudden positional change, such as getting up from prolonged sitting position  Laboratory evaluation October 2021, hemoglobin of 11.9, normal BMP  I personally reviewed the head without contrast mild generalized atrophy, ventriculomegaly seems to be out of proportion to the generalized atrophy, CT of cervical spine, degenerative changes, no acute fracture  Wife also noticed he has gradual onset of memory loss, Today's MoCA examination is only 20 out of 30, he has apparent visuospatial difficulties  UPDATE April 25 2020: He is accompanied by his daughter and  wife at today's clinical visit, tolerating Sinemet 25/100 mg, he is taking it at 7 AM, 12, 6 PM, with meal, tolerating medication well, did reported at least moderate improvement, he can walk better, no significant side effect  Personally reviewed MRI of the brain without contrast, moderate atrophy, mild supratentorial and small vessel disease, ventriculomegaly,  Laboratory evaluation February 2022, normal B12, RPR, BMP, CBC showed mildly decreased hemoglobin 11.3, is at his baseline, 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 Really wants more testing for PD. Started Sinemet 25-100 mg 3 times daily, since then medication, helps sleep better, move quicker, do tasks around the house.  Feels stumble over words, can't find the right word. Anxiety is better with Celexa. Still trouble parking the car, but none driving. Feels his balance is off, no falls, still walks 1.5 miles a day. Takes longer to get dressed. Still dizziness first thing in morning, gets better, this since the fall, improving overtime.   REVIEW OF SYSTEMS: Full 14 system review of systems performed and notable only for as above  See HPI  ALLERGIES: Allergies  Allergen Reactions   Shellfish Allergy Hives and Swelling    HOME MEDICATIONS: Current Outpatient Medications  Medication Sig Dispense Refill   atorvastatin (LIPITOR) 10 MG tablet Take 10 mg by mouth daily.     carbidopa-levodopa (SINEMET IR) 25-100 MG tablet Take 1 tablet by mouth 3 (three) times daily. 270 tablet 4  citalopram (CELEXA) 10 MG tablet Take 1 tablet (10 mg total) by mouth daily. 30 tablet 6   Melatonin 10 MG TABS Take 10 mg by mouth at bedtime.     Multiple Vitamin (MULTI-VITAMIN DAILY PO) Take 1 tablet by mouth daily.     No current facility-administered medications for this visit.    PAST MEDICAL  HISTORY: Past Medical History:  Diagnosis Date   Anxiety    High cholesterol    Imbalance    Low back pain     PAST SURGICAL HISTORY: Past Surgical History:  Procedure Laterality Date   CYST REMOVAL LEG  2014    FAMILY HISTORY: Family History  Problem Relation Age of Onset   Stroke Mother    Heart disease Father    Heart attack Paternal Grandfather     SOCIAL HISTORY: Social History   Socioeconomic History   Marital status: Married    Spouse name: Not on file   Number of children: 5   Years of education: college   Highest education level: Bachelor's degree (e.g., BA, AB, BS)  Occupational History   Occupation: Retired  Tobacco Use   Smoking status: Never   Smokeless tobacco: Current  Substance and Sexual Activity   Alcohol use: Yes    Comment: one beer weekly   Drug use: Never   Sexual activity: Not on file  Other Topics Concern   Not on file  Social History Narrative   Lives at home with his wife.   Right-handed.   Caffeine use: recently discontinued daily use of [facility].   Social Determinants of Health   Financial Resource Strain: Not on file  Food Insecurity: Not on file  Transportation Needs: Not on file  Physical Activity: Not on file  Stress: Not on file  Social Connections: Not on file  Intimate Partner Violence: Not on file   PHYSICAL EXAM   Vitals:   08/24/20 1049  BP: 125/82  Pulse: 84  Weight: 206 lb (93.4 kg)  Height: 6' (1.829 m)    Not recorded    Body mass index is 27.94 kg/m. Physical Exam MMSE - Mini Mental State Exam 08/24/2020  Orientation to time 5  Orientation to Place 5  Registration 3  Attention/ Calculation 1  Recall 2  Language- name 2 objects 2  Language- repeat 1  Language- follow 3 step command 3  Language- read & follow direction 1  Write a sentence 1  Copy design 0  Total score 24    General: The patient is alert and cooperative at the time of the examination.  Skin: No significant peripheral  edema is noted.  Neurologic Exam  Mental status: The patient is alert and oriented x 3 at the time of the examination. The patient has apparent normal recent and remote memory, with an apparently normal attention span and concentration ability. Mild masking of face is seen.   Cranial nerves: Facial symmetry is present. Speech is normal, no aphasia or dysarthria is noted. Extraocular movements are full. Visual fields are full.  Motor: The patient has good strength in all 4 extremities. Mild-moderate bradykinesia to left upper and lower extremities with finger, toe taps. No resting tremor was noted. Handwriting sample is slow, he notes small writing.   Sensory examination: Soft touch sensation is symmetric on the face, arms, and legs.  Coordination: The patient has good finger-nose-finger and heel-to-shin bilaterally, but does not use the left hand as well   Gait and station: Able to stand  from seated position without pushoff, gait has good strides, pace, no difficulty initiating gait, decreased arm swing on the left, good turns  Reflexes: Deep tendon reflexes are symmetric and normal.  DIAGNOSTIC DATA (LABS, IMAGING, TESTING) - I reviewed patient records, labs, notes, testing and imaging myself where available.   ASSESSMENT AND PLAN  Maleke Feria is a 75 y.o. male   Parkinsonism with memory loss  -Order Datscan for further evaluation of symptoms (Idiopathic Parkinson's disease with memory loss, versus other Parkinson-like disease), helpful for clinical trajectory, symptoms present post-fall in October 2021 mostly but were developing before, does have PD signs on exam: masking of face, bradykinesia left upper/lower, decreased left arm swing with gait -Continue Sinemet 25/100 mg 3 times daily (benefit of faster movement with tasks, walking) -Hold off on Azilect until DAT scan  -MRI of the brain showed generalized atrophy, ventriculomegaly in the setting of significant brain atrophy, no  clear evidence of normal pressure hydrocephalus mild supratentorium small vessel disease -MMSE today was 24/30 today, visual spatial issues reported, as well with MMSE drawing sample, will need to follow driving -Follow-up in 4 months or sooner if needed with Dr. Krista Blue    2. Anxiety -Much better with Celexa 10 mg daily   I spent 35 minutes of face-to-face and non-face-to-face time with patient.  This included previsit chart review, lab review, study review, order entry, discussing medications, symptoms, DAT scan, management and follow-up.  Evangeline Dakin, DNP  [facility] [address] [place], [place] [address] [phone]

## 2020-08-29 ENCOUNTER — Telehealth: Payer: Self-pay | Admitting: Neurology

## 2020-08-29 NOTE — Telephone Encounter (Signed)
NP placed orders for DaTscan. I received signed patient consent. I have filled out all necessary DaTscan ppw for insurance authorization. I will give to NP to sign.

## 2020-08-30 NOTE — Telephone Encounter (Signed)
Faxed all signed ppw to [facility]. They will review & determine if a PA is needed.

## 2020-08-31 ENCOUNTER — Ambulatory Visit: Payer: Medicare Other | Admitting: Neurology

## 2020-08-31 NOTE — Telephone Encounter (Signed)
Received approval from [facility] [employer]. PA #[id number] (08/31/20- 10/15/20). Sent to [facility] to schedule.

## 2020-09-05 ENCOUNTER — Telehealth: Payer: Self-pay | Admitting: Neurology

## 2020-09-05 NOTE — Telephone Encounter (Signed)
Pt requesting a call back in regards to a very important x-ray he has coming up.

## 2020-09-05 NOTE — Telephone Encounter (Signed)
Pt states his Ins approved his x-ray, he would like to have this scheduled. Pt states he is unavailable on July 25-August 1st.

## 2020-09-06 NOTE — Telephone Encounter (Signed)
I returned patient's call. Advised that he will need to call over to [facility] to schedule the DaTscan. Provided him with the phone number, [phone].

## 2020-09-14 ENCOUNTER — Encounter (HOSPITAL_COMMUNITY)
Admission: RE | Admit: 2020-09-14 | Discharge: 2020-09-14 | Disposition: A | Discharge status: Home / Self Care | Payer: Medicare Other | Source: Ambulatory Visit | Attending: Neurology | Admitting: Neurology

## 2020-09-14 ENCOUNTER — Other Ambulatory Visit: Payer: Self-pay

## 2020-09-14 DIAGNOSIS — G20 Parkinson's disease: Secondary | ICD-10-CM | POA: Diagnosis not present

## 2020-09-14 MED ORDER — POTASSIUM IODIDE (ANTIDOTE) 130 MG PO TABS
ORAL_TABLET | ORAL | Status: AC
  Filled 2020-09-14: qty 1

## 2020-09-14 MED ORDER — POTASSIUM IODIDE (ANTIDOTE) 130 MG PO TABS: 130.0000 mg | ORAL_TABLET | Freq: Once | ORAL | Status: DC

## 2020-09-14 MED ORDER — IOFLUPANE I 123 185 MBQ/2.5ML IV SOLN
4.9000 | Freq: Once | INTRAVENOUS | Status: AC | PRN
  Administered 2020-09-14: 4.9 via INTRAVENOUS
  Filled 2020-09-14: qty 5

## 2020-09-18 ENCOUNTER — Telehealth: Payer: Self-pay | Admitting: *Deleted

## 2020-09-18 NOTE — Telephone Encounter (Signed)
-----   Message from Suzzanne Cloud, NP sent at 09/18/2020  6:02 AM EDT ----- Please call, Dat Scan findings are consistent with parkinson's syndrome pathology. This fits the clinical picture, more so got the scan to help plan for the future, he remains very high functioning. I saw in August 24, 2020, seeing Dr. Krista Blue in 4 months, okay to push follow-up slightly to review plan with Dr.Yan , but also okay to wait for 4 months, depends how his comfort is, if specific questions for him, let me know. Thanks. He should continue on Sinemet, important to continue exercise.   IMPRESSION: Bilateral decreased radiotracer activity in the putamen and asymmetric decreased radiotracer activity in the head of the RIGHT caudate nucleus. 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  Of note, DaTSCAN is not diagnostic of Parkinsonian syndromes, which remains a clinical diagnosis. DaTscan is an adjuvant test to aid in the clinical diagnosis of Parkinsonian syndromes

## 2020-09-18 NOTE — Telephone Encounter (Signed)
I spoke to the patient. He verbalized understanding of the findings. He is okay to keep his pending appt on 12/25/20. He will continue sinemet and exercise.

## 2020-11-07 ENCOUNTER — Telehealth: Payer: Self-pay | Admitting: Neurology

## 2020-11-07 NOTE — Telephone Encounter (Signed)
Pt called requesting to speak to the Provider or RN regarding possible exposure in the [place] War that may have caused his Parkinson's Disease. Please advise.

## 2020-11-07 NOTE — Telephone Encounter (Signed)
Pt returned call. Please call back.

## 2020-11-07 NOTE — Telephone Encounter (Signed)
I called the patient. He has an appt with the [place], on 11/08/20, for further evaluation of Agent Orange exposure. He needs to take his records with him. He is going to come by our office a 8am that morning to sign a release form.

## 2020-11-07 NOTE — Telephone Encounter (Signed)
Left message for a return call

## 2020-11-07 NOTE — Telephone Encounter (Signed)
Tried to reach patient again. Left another message requesting a return call.

## 2020-11-07 NOTE — Telephone Encounter (Signed)
Pt medical records ready @ front desk

## 2020-12-25 ENCOUNTER — Other Ambulatory Visit: Payer: Self-pay

## 2020-12-25 ENCOUNTER — Encounter: Payer: Self-pay | Admitting: Neurology

## 2020-12-25 ENCOUNTER — Ambulatory Visit: Payer: Medicare Other | Admitting: Neurology

## 2020-12-25 VITALS — BP 124/77 | HR 86 | Ht 71.0 in | Wt 211.0 lb

## 2020-12-25 DIAGNOSIS — R4189 Other symptoms and signs involving cognitive functions and awareness: Secondary | ICD-10-CM | POA: Diagnosis not present

## 2020-12-25 DIAGNOSIS — F419 Anxiety disorder, unspecified: Secondary | ICD-10-CM

## 2020-12-25 DIAGNOSIS — G20 Parkinson's disease: Secondary | ICD-10-CM | POA: Diagnosis not present

## 2020-12-25 MED ORDER — DONEPEZIL HCL 10 MG PO TABS
10.0000 mg | ORAL_TABLET | Freq: Every day | ORAL | 11 refills | Status: DC
Start: 2020-12-25 — End: 2021-07-04

## 2020-12-25 MED ORDER — CITALOPRAM HYDROBROMIDE 10 MG PO TABS: 20.0000 mg | ORAL_TABLET | Freq: Every day | ORAL | 11 refills | Status: DC

## 2020-12-25 NOTE — Progress Notes (Signed)
Chief Complaint  Patient presents with   Follow-up    Rm 21, with wife, PD fu, states he is getting worse and feels he is slowing down, difficulty getting dress    ASSESSMENT AND PLAN Tony [name] is a 75 y.o. male   Parkinsonism with cognitive impairment  DaTSCAN in July 2022 showed bilateral decreased radioactive tracing in the putamen and asymmetric decreased radiotracer activity in the head of the right caudate nucleus,  Improvement with Sinemet 25/100 mg, he can move faster walk better,  Add on Aricept 10 mg daily  Anxiety Encourage compliance with citalopram 10 mg daily    DIAGNOSTIC DATA (LABS, IMAGING, TESTING) - I reviewed patient records, labs, notes, testing and imaging myself where available.   HISTORICAL  Tony [name] is a 75 year old male, seen in request by his primary care physician Dr. Dagmar Hait, Steva Ready for evaluation of memory loss, unsteady gait, initial evaluation was on March 30, 2020 with his wife,  I reviewed and summarized the referring note.  Past medical history Hyperlipidemia  He has been very active all his life, played basketball until age 39, since he moved to a different house in 2020, he become less active, he doesn't have to do his yard work anymore, had a gradual onset anxiety, especially noticeable since October 2021, he has difficulty sleeping, taking melatonin, also noticed difficulty driving, could not park properly, he was treated with Lexapro for a long time, dosage was increased to 5 to 10 mg, he complains of intolerable dizziness, has to back down to 5 mg again   He is still walking his neighborhood regularly, 1 to 2 miles each day, but he noticed mild difficulty walking, tripped easily, lost dexterity, was taken to emergency room October 19, he was doing his routine walk, without any clear triggers, he fell face down, he denied loss of consciousness, he does complain dizziness with sudden positional change, such as getting up from  prolonged sitting position  Laboratory evaluation October 2021, hemoglobin of 11.9, normal BMP MRI of brain without contrast in Feb 2022 mild generalized atrophy, ventriculomegaly seems to be out of proportion to the generalized atrophy, CT of cervical spine, degenerative changes, no acute fracture  MoCA examination is only 20 out of 30, he has apparent visuospatial difficulties  With his memory loss, parkinsonian features, he was started on Sinemet 25/100 mg since initial visit in February 2022, reported moderate improvement,   Laboratory evaluation February 2022, normal B12, RPR, BMP, CBC showed mildly decreased hemoglobin 11.3, is at his baseline, 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  UPDATE December 25, 2020: He is accompanied by his wife at today's visit,   DaTSCAN in July 2022, bilateral decreased radiotracer activity in the putamen and asymmetric decreased radiotracer activity in the head of right caudate nucleus, this pattern of loss dopamine transporter activity is suggestive of parkinsonian syndrome pathology,  His walking has improved taking Sinemet 25/100 mg 3 times a day, he walks 1/2 mile each day, sometimes uses walking stick, sleeps well most of the time, wake up few times in the middle of the night using bathroom, moves a lot, talking during his dream,  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 Physical Exam   Montreal Cognitive Assessment  12/25/2020 03/30/2020  Visuospatial/ Executive (0/5) 3 0  Naming (0/3) 3 3  Attention: Read list of digits  (0/2) 2 2  Attention: Read list of letters (0/1) 0 1  Attention: Serial 7 subtraction starting at 100 (0/3) 3 2  Language: Repeat phrase (0/2) 2 1  Language : Fluency (0/1) 1 1  Abstraction (0/2) 2 2  Delayed Recall (0/5) 1 2  Orientation (0/6) 6 6  Total 23 20    General: The patient is alert and cooperative at the time of the examination.  Skin: No significant peripheral edema is noted.  Neurologic Exam  Mental status: The patient is alert and oriented x 3 at the time of the examination. The patient has apparent normal recent and remote memory, with an apparently normal attention span and concentration ability. Mild masking of face is seen.   Cranial nerves: Facial symmetry is present. Speech is normal, no aphasia or dysarthria is noted. Extraocular movements are full. Visual fields are full.  Motor: The patient has good strength in all 4 extremities. Mild-moderate bradykinesia to left upper and lower extremities with finger, toe taps. No resting tremor was noted. Handwriting sample is slow, he has small hand writing.   Sensory examination: Soft touch sensation is symmetric on the face, arms, and legs.  Coordination: No limb dysmetria, no truncal ataxia  Gait and station: Able to stand from seated position without pushoff, gait has good strides, pace, no difficulty initiating gait, decreased arm swing on the left, good turns  Reflexes: Deep tendon reflexes are symmetric and normal.  REVIEW OF SYSTEMS: Full 14 system review of systems performed and notable only for as above  See HPI  ALLERGIES: Allergies  Allergen Reactions   Shellfish Allergy Hives and Swelling    HOME MEDICATIONS: Current Outpatient Medications  Medication Sig Dispense Refill   atorvastatin (LIPITOR) 10 MG tablet Take 10 mg by mouth daily.     carbidopa-levodopa (SINEMET IR) 25-100 MG tablet Take 1 tablet by mouth 3 (three) times daily. 270 tablet 4   citalopram (CELEXA) 10 MG tablet Take 1 tablet (10  mg total) by mouth daily. 30 tablet 6   Melatonin 10 MG TABS Take 10 mg by mouth at bedtime.     Multiple Vitamin (MULTI-VITAMIN DAILY PO) Take 1 tablet by mouth daily.     No current facility-administered medications for this visit.    PAST MEDICAL HISTORY: Past Medical History:  Diagnosis Date   Anxiety    High cholesterol    Imbalance    Low back pain     PAST SURGICAL HISTORY: Past Surgical History:  Procedure Laterality Date   CYST REMOVAL LEG  2014    FAMILY HISTORY: Family History  Problem Relation Age of Onset   Stroke Mother    Heart disease Father    Heart attack Paternal Grandfather     SOCIAL HISTORY: Social History   Socioeconomic History   Marital status: Married    Spouse name: Not on file   Number of children: 5   Years of education: college   Highest education level: Bachelor's degree (e.g., BA, AB, BS)  Occupational History   Occupation: Retired  Tobacco Use   Smoking status: Never   Smokeless tobacco: Current  Substance  and Sexual Activity   Alcohol use: Yes    Comment: one beer weekly   Drug use: Never   Sexual activity: Not on file  Other Topics Concern   Not on file  Social History Narrative   Lives at home with his wife.   Right-handed.   Caffeine use: recently discontinued daily use of [facility].   Social Determinants of Health   Financial Resource Strain: Not on file  Food Insecurity: Not on file  Transportation Needs: Not on file  Physical Activity: Not on file  Stress: Not on file  Social Connections: Not on file  Intimate Partner Violence: Not on file   Total time spent reviewing the chart, obtaining history, examined patient, ordering tests, documentation, consultations and family, care coordination was 75 minutes  Marcial Pacas, M.D. Ph.D.  [facility] [place], [place] [address] Phone: [phone] Fax:      [phone]

## 2021-01-24 ENCOUNTER — Telehealth: Payer: Self-pay | Admitting: Neurology

## 2021-01-24 NOTE — Telephone Encounter (Signed)
I spoke to the patient who reports going out for his daily walk this past Saturday, 01/13/21. Says it was rainy and cold. He fell on the sidewalk while outside. Says he should not have went out in that type of weather. Also, he has limited vision due to cataracts (surgery pending). EMS came to evaluate him. No injuries and did not require treatment.

## 2021-01-24 NOTE — Telephone Encounter (Signed)
Pt called to report a bad fall he had this past Saturday. Pt states EMS came and determined he did not need to go to ED.  Please call pt to discuss.

## 2021-05-02 DIAGNOSIS — D171 Benign lipomatous neoplasm of skin and subcutaneous tissue of trunk: Secondary | ICD-10-CM | POA: Diagnosis not present

## 2021-05-02 DIAGNOSIS — C44529 Squamous cell carcinoma of skin of other part of trunk: Secondary | ICD-10-CM | POA: Diagnosis not present

## 2021-05-02 DIAGNOSIS — D485 Neoplasm of uncertain behavior of skin: Secondary | ICD-10-CM | POA: Diagnosis not present

## 2021-05-02 DIAGNOSIS — L821 Other seborrheic keratosis: Secondary | ICD-10-CM | POA: Diagnosis not present

## 2021-05-02 DIAGNOSIS — L820 Inflamed seborrheic keratosis: Secondary | ICD-10-CM | POA: Diagnosis not present

## 2021-05-02 DIAGNOSIS — L700 Acne vulgaris: Secondary | ICD-10-CM | POA: Diagnosis not present

## 2021-05-02 DIAGNOSIS — Z85828 Personal history of other malignant neoplasm of skin: Secondary | ICD-10-CM | POA: Diagnosis not present

## 2021-05-16 DIAGNOSIS — C44529 Squamous cell carcinoma of skin of other part of trunk: Secondary | ICD-10-CM | POA: Diagnosis not present

## 2021-06-27 DIAGNOSIS — Z85828 Personal history of other malignant neoplasm of skin: Secondary | ICD-10-CM | POA: Diagnosis not present

## 2021-06-27 DIAGNOSIS — L905 Scar conditions and fibrosis of skin: Secondary | ICD-10-CM | POA: Diagnosis not present

## 2021-06-27 DIAGNOSIS — L820 Inflamed seborrheic keratosis: Secondary | ICD-10-CM | POA: Diagnosis not present

## 2021-06-27 DIAGNOSIS — D485 Neoplasm of uncertain behavior of skin: Secondary | ICD-10-CM | POA: Diagnosis not present

## 2021-06-27 DIAGNOSIS — L821 Other seborrheic keratosis: Secondary | ICD-10-CM | POA: Diagnosis not present

## 2021-06-27 DIAGNOSIS — L700 Acne vulgaris: Secondary | ICD-10-CM | POA: Diagnosis not present

## 2021-07-03 DIAGNOSIS — J309 Allergic rhinitis, unspecified: Secondary | ICD-10-CM | POA: Diagnosis not present

## 2021-07-03 DIAGNOSIS — E785 Hyperlipidemia, unspecified: Secondary | ICD-10-CM | POA: Diagnosis not present

## 2021-07-03 DIAGNOSIS — G20 Parkinson's disease: Secondary | ICD-10-CM | POA: Diagnosis not present

## 2021-07-03 DIAGNOSIS — E041 Nontoxic single thyroid nodule: Secondary | ICD-10-CM | POA: Diagnosis not present

## 2021-07-03 DIAGNOSIS — G4700 Insomnia, unspecified: Secondary | ICD-10-CM | POA: Diagnosis not present

## 2021-07-03 DIAGNOSIS — K5730 Diverticulosis of large intestine without perforation or abscess without bleeding: Secondary | ICD-10-CM | POA: Diagnosis not present

## 2021-07-03 DIAGNOSIS — G629 Polyneuropathy, unspecified: Secondary | ICD-10-CM | POA: Diagnosis not present

## 2021-07-03 DIAGNOSIS — L989 Disorder of the skin and subcutaneous tissue, unspecified: Secondary | ICD-10-CM | POA: Diagnosis not present

## 2021-07-03 DIAGNOSIS — D568 Other thalassemias: Secondary | ICD-10-CM | POA: Diagnosis not present

## 2021-07-03 DIAGNOSIS — M48061 Spinal stenosis, lumbar region without neurogenic claudication: Secondary | ICD-10-CM | POA: Diagnosis not present

## 2021-07-03 NOTE — Progress Notes (Signed)
? ? ?Patient: Tony [name] ?Date of Birth: March 30, 1945 ? ?Reason for Visit: Follow up for PD ?History from: Patient, daughter  ?Primary Neurologist: Dr. Krista Blue  ? ?ASSESSMENT AND PLAN ?77 y.o. year old male  ? ?Parkinsonism with cognitive impairment  ?-Evidence of bradykinesia and rigidity on exam, Keep Sinemet 25/100 1 tablet 3 times daily, but try taking 30-60 minutes before or after food to see if any change in movement, if bradykinesia still issue, call us,we could try dose increase  ?-Continue exercise, activity, make sure to drink plenty of water ?-Move Aricept to AM, due to dreams ?-Starting Trazodone from PCP to help with sleep ?-Memory is declining, MOCA was 16/30 today ?-DaTSCAN in July 2022 showed bilateral decreased radioactive tracing in the putamen and asymmetric decreased radiotracer activity in the head of the right caudate nucleus ?-Return in 4 months or sooner if needed ? ?Anxiety  ?-Continue Celexa 20 mg daily  ? ?HISTORY  ?Tony [name] is a 76 year old male, seen in request by his primary care physician Dr. Dagmar Hait, Steva Ready for evaluation of memory loss, unsteady gait, initial evaluation was on March 30, 2020 with his wife, ?  ?I reviewed and summarized the referring note.  Past medical history ?Hyperlipidemia ?  ?He has been very active all his life, played basketball until age 55, since he moved to a different house in 2020, he become less active, he doesn't have to do his yard work anymore, had a gradual onset anxiety, especially noticeable since October 2021, he has difficulty sleeping, taking melatonin, also noticed difficulty driving, could not park properly, he was treated with Lexapro for a long time, dosage was increased to 5 to 10 mg, he complains of intolerable dizziness, has to back down to 5 mg again  ?  ?He is still walking his neighborhood regularly, 1 to 2 miles each day, but he noticed mild difficulty walking, tripped easily, lost dexterity, was taken to emergency room October  19, he was doing his routine walk, without any clear triggers, he fell face down, he denied loss of consciousness, he does complain dizziness with sudden positional change, such as getting up from prolonged sitting position ?  ?Laboratory evaluation October 2021, hemoglobin of 11.9, normal BMP ?MRI of brain without contrast in Feb 2022 mild generalized atrophy, ventriculomegaly seems to be out of proportion to the generalized atrophy, CT of cervical spine, degenerative changes, no acute fracture ?  ?MoCA examination is only 20 out of 30, he has apparent visuospatial difficulties ?  ?With his memory loss, parkinsonian features, he was started on Sinemet 25/100 mg since initial visit in February 2022, reported moderate improvement,  ?  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  ?UPDATE December 25, 2020: ?He is accompanied by his wife at today's visit,  ?  ?DaTSCAN in July 2022, bilateral decreased radiotracer activity in the putamen and asymmetric decreased radiotracer activity in the head of right caudate nucleus, this pattern of loss dopamine transporter activity is suggestive of parkinsonian syndrome pathology, ?  ?His walking has improved taking Sinemet 25/100 mg 3 times a day, he walks 1/2 mile each day, sometimes uses walking stick, sleeps well most of the time, wake up few times in the middle of the night using bathroom, moves a lot, talking during his dream, ? 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 ?Update Jul 04, 2021 SS: Here with daughter, rebecca. Had a fall in November had walked 1 mile a day on the way home, got dizzy, fell, couldn't get up, EMS came, has been  working with [place], doing cognitive exercises, hand exercises, chair exercises, stationary bike twice weekly. Takes Melatonin 10 mg at night, goes to sleep, but makes up during the night, he can't go back to sleep, during the day he feels drowsy. PCP started trazodone started 50 mg yesterday, stopped melatonin. Feels unsteady, doesn't like to use the cane. Got benefit from PT/OT, stopped 3 weeks ago. Still drives some. Feels memory has regressed, he has 4 daughters, 1 son. Feels slow often, but no more related to Sinemet. Takes him 30 minutes to take a shower. Taking a bus tour to [place].  ? ?REVIEW OF SYSTEMS: Out of a complete 14 system review of symptoms, the patient complains only of the following symptoms, and all other reviewed systems are negative. ? ?See HPI ? ?ALLERGIES: ?Allergies  ?Allergen Reactions  ? Shellfish Allergy Hives and Swelling  ? ? ?HOME MEDICATIONS: ?Outpatient Medications Prior to Visit  ?Medication Sig Dispense Refill  ? atorvastatin (LIPITOR) 10 MG tablet Take 10 mg by mouth daily.    ? Melatonin 10 MG TABS Take 10 mg by mouth at bedtime.    ? Multiple Vitamin (MULTI-VITAMIN DAILY PO) Take 1 tablet by mouth daily.    ? carbidopa-levodopa (SINEMET IR) 25-100 MG tablet Take 1 tablet by mouth 3 (three) times daily. 270 tablet 4  ? citalopram (CELEXA) 10 MG tablet Take 2 tablets (20 mg total) by mouth daily. 60 tablet 11  ? donepezil (ARICEPT) 10 MG tablet Take 1 tablet (10 mg total) by mouth at bedtime. 30 tablet 11  ? ?No facility-administered medications prior to visit.  ? ? ?PAST MEDICAL HISTORY: ?Past Medical History:  ?Diagnosis Date  ? Anxiety   ? High cholesterol   ? Imbalance   ? Low back pain   ? ? ?PAST SURGICAL HISTORY: ?Past Surgical History:  ?Procedure Laterality Date  ? CYST REMOVAL LEG  2014  ? ? ?FAMILY HISTORY: ?Family History  ?Problem Relation Age of Onset  ? Stroke Mother   ? Heart disease Father   ? Heart attack Paternal Grandfather   ? ? ?SOCIAL HISTORY: ?Social  History  ? ?Socioeconomic History  ? Marital status: Married  ?  Spouse name: Not on file  ? Number of children: 5  ? Years of education: college  ? Highest education level: Bachelor's degree (e.g., BA, AB, BS)  ?Occupational History  ? Occupation: Retired  ?Tobacco Use  ? Smoking status: Never  ? Smokeless tobacco: Current  ?Substance and Sexual Activity  ? Alcohol use: Yes  ?  Comment: one beer weekly  ? Drug use: Never  ? Sexual activity: Not on file  ?Other Topics Concern  ? Not on file  ?Social History Narrative  ? Lives at home with his wife.  ? Right-handed.  ? Caffeine use: recently discontinued daily use of [facility].  ? ?Social Determinants of Health  ? ?Financial Resource Strain: Not on file  ?Food Insecurity: Not on file  ?Transportation Needs: Not on file  ?Physical Activity: Not on file  ?Stress: Not on file  ?Social Connections: Not on file  ?Intimate Partner Violence: Not on file  ? ? ?  PHYSICAL EXAM ? ?Vitals:  ? 07/04/21 0946  ?BP: 139/71  ?Pulse: 73  ?Weight: 206 lb (93.4 kg)  ?Height: '5\' 11"'$  (1.803 m)  ? ?Body mass index is 28.73 kg/m?. ? ?  07/04/2021  ? 10:35 AM 12/25/2020  ? 11:00 AM 03/30/2020  ?  8:00 AM  ?Montreal Cognitive Assessment   ?Visuospatial/ Executive (0/5) 0 3 0  ?Naming (0/3) '2 3 3  '$ ?Attention: Read list of digits (0/2) '2 2 2  '$ ?Attention: Read list of letters (0/1) 0 0 1  ?Attention: Serial 7 subtraction starting at 100 (0/3) '1 3 2  '$ ?Language: Repeat phrase (0/2) '2 2 1  '$ ?Language : Fluency (0/1) '1 1 1  '$ ?Abstraction (0/2) '2 2 2  '$ ?Delayed Recall (0/5) 0 1 2  ?Orientation (0/6) '6 6 6  '$ ?Total '16 23 20  '$ ? ? ?Generalized: Well developed, in no acute distress, mild masking of the face  ?Neurological examination  ?Mentation: Alert oriented to time, place, history taking. Speech is clear, slight low voice, speech is slightly wet ?Cranial nerve II-XII: Pupils were equal round reactive to light. Extraocular movements were full, visual field were full on confrontational test. Facial  sensation and strength were normal.  Head turning and shoulder shrug  were normal and symmetric. ?Motor: Good strength overall, increased rigidity to left upper and lower, moderate bradykinesia apparently more on the

## 2021-07-04 ENCOUNTER — Ambulatory Visit: Payer: PPO | Admitting: Neurology

## 2021-07-04 VITALS — BP 139/71 | HR 73 | Ht 71.0 in | Wt 206.0 lb

## 2021-07-04 DIAGNOSIS — F419 Anxiety disorder, unspecified: Secondary | ICD-10-CM | POA: Diagnosis not present

## 2021-07-04 DIAGNOSIS — R4189 Other symptoms and signs involving cognitive functions and awareness: Secondary | ICD-10-CM

## 2021-07-04 DIAGNOSIS — G20 Parkinson's disease: Secondary | ICD-10-CM | POA: Diagnosis not present

## 2021-07-04 MED ORDER — CARBIDOPA-LEVODOPA 25-100 MG PO TABS: 1.0000 | ORAL_TABLET | Freq: Three times a day (TID) | ORAL | 4 refills | Status: DC

## 2021-07-04 MED ORDER — DONEPEZIL HCL 10 MG PO TABS: 10.0000 mg | ORAL_TABLET | Freq: Every morning | ORAL | 3 refills | Status: DC

## 2021-07-04 MED ORDER — CITALOPRAM HYDROBROMIDE 10 MG PO TABS: 20.0000 mg | ORAL_TABLET | Freq: Every day | ORAL | 3 refills | Status: DC

## 2021-07-04 NOTE — Patient Instructions (Signed)
Switch the Donepezil to morning time ?Try taking Sinemet 3 times daily but 30-60 minutes before or after food  ?Make sure drinking enough water  ?Continue to do your home exercises ?See you back in 4 months  ?

## 2021-07-04 NOTE — Progress Notes (Signed)
Chart reviewed, agree above plan ?

## 2021-07-10 ENCOUNTER — Encounter: Payer: Self-pay | Admitting: Neurology

## 2021-09-04 DIAGNOSIS — Z125 Encounter for screening for malignant neoplasm of prostate: Secondary | ICD-10-CM | POA: Diagnosis not present

## 2021-09-04 DIAGNOSIS — E785 Hyperlipidemia, unspecified: Secondary | ICD-10-CM | POA: Diagnosis not present

## 2021-09-04 DIAGNOSIS — E041 Nontoxic single thyroid nodule: Secondary | ICD-10-CM | POA: Diagnosis not present

## 2021-09-04 DIAGNOSIS — R7989 Other specified abnormal findings of blood chemistry: Secondary | ICD-10-CM | POA: Diagnosis not present

## 2021-09-11 DIAGNOSIS — Z23 Encounter for immunization: Secondary | ICD-10-CM | POA: Diagnosis not present

## 2021-09-11 DIAGNOSIS — G4700 Insomnia, unspecified: Secondary | ICD-10-CM | POA: Diagnosis not present

## 2021-09-11 DIAGNOSIS — L989 Disorder of the skin and subcutaneous tissue, unspecified: Secondary | ICD-10-CM | POA: Diagnosis not present

## 2021-09-11 DIAGNOSIS — G629 Polyneuropathy, unspecified: Secondary | ICD-10-CM | POA: Diagnosis not present

## 2021-09-11 DIAGNOSIS — J309 Allergic rhinitis, unspecified: Secondary | ICD-10-CM | POA: Diagnosis not present

## 2021-09-11 DIAGNOSIS — M48061 Spinal stenosis, lumbar region without neurogenic claudication: Secondary | ICD-10-CM | POA: Diagnosis not present

## 2021-09-11 DIAGNOSIS — E785 Hyperlipidemia, unspecified: Secondary | ICD-10-CM | POA: Diagnosis not present

## 2021-09-11 DIAGNOSIS — K5730 Diverticulosis of large intestine without perforation or abscess without bleeding: Secondary | ICD-10-CM | POA: Diagnosis not present

## 2021-09-11 DIAGNOSIS — D568 Other thalassemias: Secondary | ICD-10-CM | POA: Diagnosis not present

## 2021-09-11 DIAGNOSIS — E041 Nontoxic single thyroid nodule: Secondary | ICD-10-CM | POA: Diagnosis not present

## 2021-09-11 DIAGNOSIS — G20 Parkinson's disease: Secondary | ICD-10-CM | POA: Diagnosis not present

## 2021-09-11 DIAGNOSIS — Z0000 Encounter for general adult medical examination without abnormal findings: Secondary | ICD-10-CM | POA: Diagnosis not present

## 2021-10-16 DIAGNOSIS — L0291 Cutaneous abscess, unspecified: Secondary | ICD-10-CM | POA: Diagnosis not present

## 2021-10-16 DIAGNOSIS — L089 Local infection of the skin and subcutaneous tissue, unspecified: Secondary | ICD-10-CM | POA: Diagnosis not present

## 2021-10-18 DIAGNOSIS — L0231 Cutaneous abscess of buttock: Secondary | ICD-10-CM | POA: Diagnosis not present

## 2021-11-07 DIAGNOSIS — B958 Unspecified staphylococcus as the cause of diseases classified elsewhere: Secondary | ICD-10-CM | POA: Diagnosis not present

## 2021-11-07 DIAGNOSIS — L720 Epidermal cyst: Secondary | ICD-10-CM | POA: Diagnosis not present

## 2021-11-07 DIAGNOSIS — L700 Acne vulgaris: Secondary | ICD-10-CM | POA: Diagnosis not present

## 2021-11-07 DIAGNOSIS — D2239 Melanocytic nevi of other parts of face: Secondary | ICD-10-CM | POA: Diagnosis not present

## 2021-11-07 DIAGNOSIS — L821 Other seborrheic keratosis: Secondary | ICD-10-CM | POA: Diagnosis not present

## 2021-11-07 DIAGNOSIS — D2261 Melanocytic nevi of right upper limb, including shoulder: Secondary | ICD-10-CM | POA: Diagnosis not present

## 2021-11-07 DIAGNOSIS — D171 Benign lipomatous neoplasm of skin and subcutaneous tissue of trunk: Secondary | ICD-10-CM | POA: Diagnosis not present

## 2021-11-07 DIAGNOSIS — L820 Inflamed seborrheic keratosis: Secondary | ICD-10-CM | POA: Diagnosis not present

## 2021-11-07 DIAGNOSIS — Z85828 Personal history of other malignant neoplasm of skin: Secondary | ICD-10-CM | POA: Diagnosis not present

## 2021-11-07 DIAGNOSIS — L03317 Cellulitis of buttock: Secondary | ICD-10-CM | POA: Diagnosis not present

## 2021-11-08 ENCOUNTER — Ambulatory Visit: Payer: PPO | Admitting: Neurology

## 2021-11-08 ENCOUNTER — Encounter: Payer: Self-pay | Admitting: Neurology

## 2021-11-08 VITALS — BP 120/75 | HR 71 | Ht 71.0 in | Wt 202.0 lb

## 2021-11-08 DIAGNOSIS — G4710 Hypersomnia, unspecified: Secondary | ICD-10-CM | POA: Diagnosis not present

## 2021-11-08 DIAGNOSIS — F419 Anxiety disorder, unspecified: Secondary | ICD-10-CM | POA: Diagnosis not present

## 2021-11-08 DIAGNOSIS — R269 Unspecified abnormalities of gait and mobility: Secondary | ICD-10-CM | POA: Diagnosis not present

## 2021-11-08 DIAGNOSIS — R4189 Other symptoms and signs involving cognitive functions and awareness: Secondary | ICD-10-CM | POA: Diagnosis not present

## 2021-11-08 DIAGNOSIS — G20 Parkinson's disease: Secondary | ICD-10-CM

## 2021-11-08 MED ORDER — CARBIDOPA-LEVODOPA 25-100 MG PO TABS: 2.0000 | ORAL_TABLET | Freq: Three times a day (TID) | ORAL | 11 refills | Status: DC

## 2021-11-08 NOTE — Progress Notes (Signed)
ASSESSMENT AND PLAN 76 y.o. year old male   Parkinsonism with cognitive impairment  DaTSCAN in July 2022 showed bilateral decreased radioactive tracing in the putamen and asymmetric decreased radiotracer activity in the head of the right caudate nucleus His cognitive impairment progress quickly compared to the idiopathic typical Parkinson's patient, he seems to have less optimal response to Sinemet, differentiation diagnosis including idiopathic Parkinson's disease with cognitive impairment versus Lewy body dementia Titrating Sinemet to 25/100 mg 2 tablets 3 times a day, it is okay to take medicine before meal if he can tolerate it Referral to physical therapy At risk for obstructive sleep apnea Complains of excessive drowsiness, sleepiness,  Narrow oropharyngeal space  Refer to sleep study    DIAGNOSTIC DATA (LABS, IMAGING, TESTING) - I reviewed patient records, labs, notes, testing and imaging myself where available.     DaTSCAN in July 2022, bilateral decreased radiotracer activity in the putamen and asymmetric decreased radiotracer activity in the head of right caudate nucleus, this pattern of loss dopamine transporter activity is suggestive of parkinsonian syndrome pathology,    HISTORY  Tony [name] is a 76 year old male, seen in request by his primary care physician Dr. Dagmar Hait, Steva Ready for evaluation of memory loss, unsteady gait, initial evaluation was on March 30, 2020 with his wife,   I reviewed and summarized the referring note.  Past medical history Hyperlipidemia   He has been very active all his life, played basketball until age 76, since he moved to a different house in 2020, he become less active, he doesn't have to do his yard work anymore, had a gradual onset anxiety, especially noticeable since October 2021, he has difficulty sleeping, taking melatonin, also noticed difficulty driving, could not park properly, he was treated with Lexapro for a long time,  dosage was increased to 5 to 10 mg, he complains of intolerable dizziness, has to back down to 5 mg again    He is still walking his neighborhood regularly, 1 to 2 miles each day, but he noticed mild difficulty walking, tripped easily, lost dexterity, was taken to emergency room October 19, he was doing his routine walk, without any clear triggers, he fell face down, he denied loss of consciousness, he does complain dizziness with sudden positional change, such as getting up from prolonged sitting position   Laboratory evaluation October 2021, hemoglobin of 11.9, normal BMP MRI of brain without contrast in Feb 2022 mild generalized atrophy, ventriculomegaly seems to be out of proportion to the generalized atrophy, CT of cervical spine, degenerative changes, no acute fracture   MoCA examination is only 20 out of 30, he has apparent visuospatial difficulties   With his memory loss, parkinsonian features, he was started on Sinemet 25/100 mg since initial visit in February 2022, reported moderate improvement,    Laboratory evaluation February 2022, normal B12, RPR, BMP, CBC showed mildly decreased hemoglobin 11.3, is at his baseline, 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    UPDATE December 25, 2020: He is accompanied by his wife at today's visit,    DaTSCAN in July 2022, bilateral decreased radiotracer activity in the putamen and asymmetric decreased radiotracer activity in the head of right caudate nucleus, this pattern of loss dopamine transporter activity is suggestive of parkinsonian syndrome pathology,   His walking has improved taking Sinemet 25/100 mg 3 times a day, he walks 1/2 mile each day, sometimes uses walking stick, sleeps well most of the time, wake up few times in the middle of  the night using bathroom, moves a lot, talking during his dream,  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, 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[employer], was noted to have mild memory loss,  tends to [employer], drinks a lot of water, driving without difficulty, seems to be more depressed anxious as well  UPDATE Sept 14 2023: He is accompanied by his wife and daughter at today's clinical visit, reported decline, slower motion, increased difficulty walking, difficulty buttoning his shirt  MoCA examination 26/30, mostly visual-spatial disorientation, slow processing time,   He is taking Sinemet 25/100 mg 30 minutes after meal, prior he was taking empty stomach, complains of dizziness, nausea is better, today's examination is performed 90 minutes after last dose of Sinemet, significant parkinsonian features, left worse than right  Physical therapy was helpful, is motivated to do cycling regularly every week  He complains of excessive drowsiness, sleepiness, taking frequent naps, loud snoring, narrow oropharyngeal space, at risk for obstructive sleep apnea   REVIEW OF SYSTEMS: Out of a complete 14 system review of symptoms, the patient complains only of the following symptoms, and all other reviewed systems are negative.  See HPI  PHYSICAL EXAM  Vitals:   11/08/21 0932  BP: 120/75  Pulse: 71  Weight: 202 lb (91.6 kg)  Height: '5\' 11"'$  (1.803 m)   Body mass index is 28.17 kg/m.  Sitting down 114/75, 74;  Standing up 128/78, 78,  Standing up 3 minutes 126/77, 76,      11/08/2021    9:00 AM 07/04/2021   10:35 AM 12/25/2020   11:00 AM 03/30/2020    8:00 AM  Montreal Cognitive Assessment   Visuospatial/ Executive (0/5) 1 0 3 0  Naming (0/3) '3 2 3 3  '$ Attention: Read list of digits (0/2) '2 2 2 2  '$ Attention: Read list of letters (0/1) 1 0 0 1  Attention: Serial 7 subtraction starting at 100 (0/3) '3 1 3 2  '$ Language: Repeat phrase (0/2) '2 2 2 1  '$ Language : Fluency (0/1) '1 1 1 1  '$ Abstraction (0/2) '2 2 2 2  '$ Delayed Recall (0/5) 2 0 1 2  Orientation (0/6) '6 6 6 6  '$ Total '23 16 23 20      '$ Generalized:  masked face, slow processing time, soft voice,follow neuro  commands, Neurological examination  Cranial nerve II-XII: Pupils were small equal round reactive to light. Extraocular movements were full, smooth pursuit was broken to small Catch up saccade, Difficulty with Vertical Gaze,.  Facial symmetric, facial sensation and strength were normal.  Head turning and shoulder shrug  were normal and symmetric. Narrow oral pharyngeal space Motor: Left more than right severe rigidity, bradykinesia, Sensory: intact to light touch Coordination: No truncal ataxia or limb dysmetria, Gait and station: Need push-up to get up from seated position, absent left side swing, en bloc turning, severe retropulse instability Reflexes: Deep tendon reflexes are hypo active and symmetric bilaterally  ALLERGIES: Allergies  Allergen Reactions   Shellfish Allergy Hives and Swelling    HOME MEDICATIONS: Outpatient Medications Prior to Visit  Medication Sig Dispense Refill   atorvastatin (LIPITOR) 10 MG tablet Take 10 mg by mouth daily.     carbidopa-levodopa (SINEMET IR) 25-100 MG tablet Take 1 tablet by mouth 3 (three) times daily. 270 tablet 4   citalopram (CELEXA) 10 MG tablet Take 2 tablets (20 mg total) by mouth daily. 180 tablet 3   donepezil (ARICEPT) 10 MG tablet Take 1 tablet (10 mg total)  by mouth in the morning. 90 tablet 3   fexofenadine (ALLEGRA) 60 MG tablet Take 60 mg by mouth 2 (two) times daily.     Multiple Vitamin (MULTI-VITAMIN DAILY PO) Take 1 tablet by mouth daily.     traZODone (DESYREL) 50 MG tablet      Melatonin 10 MG TABS Take 10 mg by mouth at bedtime.     No facility-administered medications prior to visit.    PAST MEDICAL HISTORY: Past Medical History:  Diagnosis Date   Anxiety    High cholesterol    Imbalance    Low back pain     PAST SURGICAL HISTORY: Past Surgical History:  Procedure Laterality Date   CYST REMOVAL LEG  2014    FAMILY HISTORY: Family History  Problem Relation Age of Onset   Stroke Mother    Heart disease  Father    Heart attack Paternal Grandfather     SOCIAL HISTORY: Social History   Socioeconomic History   Marital status: Married    Spouse name: Not on file   Number of children: 5   Years of education: college   Highest education level: Bachelor's degree (e.g., BA, AB, BS)  Occupational History   Occupation: Retired  Tobacco Use   Smoking status: Never   Smokeless tobacco: Current  Substance and Sexual Activity   Alcohol use: Yes    Comment: one beer weekly   Drug use: Never   Sexual activity: Not on file  Other Topics Concern   Not on file  Social History Narrative   Lives at home with his wife.   Right-handed.   Caffeine use: recently discontinued daily use of [facility].   Social Determinants of Health   Financial Resource Strain: Not on file  Food Insecurity: Not on file  Transportation Needs: Not on file  Physical Activity: Not on file  Stress: Not on file  Social Connections: Not on file  Intimate Partner Violence: Not on file    Marcial Pacas, M.D. Ph.D.  [facility] [place], [place] [address] Phone: [phone] Fax:      [phone]   Total time spent reviewing the chart, obtaining history, examined patient, ordering tests, documentation, consultations and family, care coordination was 63

## 2021-11-20 ENCOUNTER — Other Ambulatory Visit: Payer: Self-pay | Admitting: Neurology

## 2021-11-20 ENCOUNTER — Ambulatory Visit: Payer: PPO | Admitting: Neurology

## 2021-11-28 ENCOUNTER — Ambulatory Visit: Payer: PPO | Attending: Neurology | Admitting: Physical Therapy

## 2021-11-28 ENCOUNTER — Telehealth: Payer: Self-pay | Admitting: Physical Therapy

## 2021-11-28 DIAGNOSIS — R471 Dysarthria and anarthria: Secondary | ICD-10-CM | POA: Insufficient documentation

## 2021-11-28 DIAGNOSIS — R2681 Unsteadiness on feet: Secondary | ICD-10-CM | POA: Insufficient documentation

## 2021-11-28 DIAGNOSIS — F419 Anxiety disorder, unspecified: Secondary | ICD-10-CM | POA: Insufficient documentation

## 2021-11-28 DIAGNOSIS — Z9181 History of falling: Secondary | ICD-10-CM | POA: Diagnosis not present

## 2021-11-28 DIAGNOSIS — R2689 Other abnormalities of gait and mobility: Secondary | ICD-10-CM | POA: Insufficient documentation

## 2021-11-28 DIAGNOSIS — R1310 Dysphagia, unspecified: Secondary | ICD-10-CM | POA: Diagnosis not present

## 2021-11-28 DIAGNOSIS — G20C Parkinsonism, unspecified: Secondary | ICD-10-CM

## 2021-11-28 DIAGNOSIS — R269 Unspecified abnormalities of gait and mobility: Secondary | ICD-10-CM | POA: Diagnosis not present

## 2021-11-28 DIAGNOSIS — R41841 Cognitive communication deficit: Secondary | ICD-10-CM | POA: Insufficient documentation

## 2021-11-28 DIAGNOSIS — G4710 Hypersomnia, unspecified: Secondary | ICD-10-CM | POA: Insufficient documentation

## 2021-11-28 DIAGNOSIS — R293 Abnormal posture: Secondary | ICD-10-CM | POA: Diagnosis not present

## 2021-11-28 DIAGNOSIS — R4189 Other symptoms and signs involving cognitive functions and awareness: Secondary | ICD-10-CM | POA: Diagnosis not present

## 2021-11-28 DIAGNOSIS — G20A1 Parkinson's disease without dyskinesia, without mention of fluctuations: Secondary | ICD-10-CM | POA: Insufficient documentation

## 2021-11-28 NOTE — Telephone Encounter (Signed)
Dr. Krista Blue,  Charlene Brooke was evaluated by PT on 11/28/21.  The patient would benefit from an OT and SLP evaluation for deficits 2/2 PD.    If you agree, please place an order in [facility]que in [employer] or fax the order to [phone].  Thank you, Francena Hanly, PT, [place] [address] [place],   [address] Phone:  [phone] Fax:  [phone]

## 2021-11-28 NOTE — Therapy (Signed)
OUTPATIENT PHYSICAL THERAPY NEURO EVALUATION   Patient Name: Tony [name] MRN: [id number] DOB:[birth year], 76 y.o., male Today's Date: 11/28/2021   PCP: Prince Solian, MD  REFERRING PROVIDER: Marcial Pacas, MD    PT End of Session - 11/28/21 1240     Visit Number 1    Number of Visits 17   Plus eval   Date for PT Re-Evaluation 01/30/22    Authorization Type [employer] Part B    PT Start Time 1233    PT Stop Time 1317    PT Time Calculation (min) 44 min    Activity Tolerance Patient tolerated treatment well    Behavior During Therapy WFL for tasks assessed/performed             Past Medical History:  Diagnosis Date   Anxiety    High cholesterol    Imbalance    Low back pain    Past Surgical History:  Procedure Laterality Date   CYST REMOVAL LEG  2014   Patient Active Problem List   Diagnosis Date Noted   Excessive sleepiness 11/08/2021   Cognitive impairment 12/25/2020   Anxiety 04/25/2020   Gait abnormality 03/30/2020   Parkinson's disease 03/30/2020   Memory loss 03/30/2020   Parkinsonism 03/30/2020   CELLULITIS AND ABSCESS OF FACE 02/01/2008   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/2006   HYPERLIPIDEMIA 06/30/2006   DIVERTICULOSIS, COLON 06/30/2006   SEBORRHEIC KERATOSIS 06/30/2006   BACK PAIN, LUMBAR 06/30/2006    ONSET DATE: 11/08/2021   REFERRING DIAG: G20 (ICD-10-CM) - Parkinson's disease R41.89 (ICD-10-CM) - Cognitive impairment F41.9 (ICD-10-CM) - Anxiety R26.9 (ICD-10-CM) - Gait abnormality G47.10 (ICD-10-CM) - Excessive sleepiness   THERAPY DIAG:  Unsteadiness on feet  History of falling  Abnormal posture  Other abnormalities of gait and mobility  Rationale for Evaluation and Treatment Rehabilitation  SUBJECTIVE:                                                                                                                                                                                               SUBJECTIVE STATEMENT: Pt reports he was receiving HHPT/HHOT in March and then again in August. Pt reports his main difficulty is getting dressed (buttons, pulling pants on) and he though HHPT/HHOT was helpful. Is going to [facility] and performing "intensive cycling" for 45 minutes, 2x/week. Having more difficulty w/L side, "my hand just goes awry". Pt's daughter reports pt falls about 1x/week, mostly in forward direction. Pt has a lot of difficulty getting into/out of tub due to high height. All pt's symptoms started about 2 years ago. Pt's wife reports  pt has a lot if difficulty standing up from a chair and has fallen at least 3x at a restaurant due to falling backwards while trying to stand. Daughter reports pt has difficulty sequencing his feet w/turns as well. Wife reports pt gets fatigued very quickly and he goes to take a nap right after breakfast. Is on a good schedule w/Sinemet.    Pt accompanied by: significant other and Daughter  PERTINENT HISTORY: DaTSCAN in July 2022 showed bilateral decreased radioactive tracing in the putamen and asymmetric decreased radiotracer activity in the head of the right caudate nucleus His cognitive impairment progress quickly compared to the idiopathic typical Parkinson's patient, he seems to have less optimal response to Sinemet, differentiation diagnosis including idiopathic Parkinson's disease with cognitive impairment versus Lewy body dementia  PAIN:  Are you having pain? No  PRECAUTIONS: Fall  WEIGHT BEARING RESTRICTIONS No  FALLS: Has patient fallen in last 6 months? Yes. Number of falls ~6 or more   LIVING ENVIRONMENT: Lives with: lives with their spouse Lives in: House/apartment Stairs: No Has following equipment at home: Programmer, multimedia, Environmental consultant - 2 wheeled, Environmental consultant - 4 wheeled, Grab bars, and [employer]der System  PLOF: Independent  PATIENT GOALS "Work on my dexterity"   OBJECTIVE:   DIAGNOSTIC FINDINGS: DaTSCAN in July 2022 showed bilateral  decreased radioactive tracing in the putamen and asymmetric decreased radiotracer activity in the head of the right caudate nucleus   COGNITION: Overall cognitive status: Impaired Noted saccadic intrusions of L eye during eval   COORDINATION: NT   POSTURE: rounded shoulders, forward head, and flexed trunk    LOWER EXTREMITY MMT:  To be assessed next session   MMT Right Eval Left Eval  Hip flexion    Hip extension    Hip abduction    Hip adduction    Hip internal rotation    Hip external rotation    Knee flexion    Knee extension    Ankle dorsiflexion    Ankle plantarflexion    Ankle inversion    Ankle eversion    (Blank rows = not tested)  BED MOBILITY:  Independent per pt   TRANSFERS: Assistive device utilized:  Hurrycane   Sit to stand: SBA Pt requires BUE support initially but is able to stand without UE support w/practice. Noted retropulsion and heavy bracing against back of chair throughout  Stand to sit: SBA   GAIT: Gait pattern: step through pattern, decreased arm swing- Left, decreased step length- Left, decreased stride length, decreased hip/knee flexion- Right, decreased hip/knee flexion- Left, decreased ankle dorsiflexion- Right, decreased ankle dorsiflexion- Left, shuffling, decreased trunk rotation, trunk flexed, poor foot clearance- Right, and poor foot clearance- Left Distance walked: Various clinic distances  Assistive device utilized:  hurrycane Level of assistance: CGA Comments: Will further assess gait next session.   FUNCTIONAL TESTs:   [facility] PT Assessment - 11/28/21 1509       Transfers   Five time sit to stand comments  18.5s w/BUE support on reps 1-3 and no UE support on 4-5. Noted significant bracing against back of chair to manage retropulsion              TODAY'S TREATMENT:  Next Session    PATIENT EDUCATION: Education details: POC, eval findings, brief PD education (med schedule, atypical vs idiopathic, signs/symptoms)   Person educated: Patient, Spouse, and Child(ren) Education method: Explanation and Demonstration Education comprehension: verbalized understanding and needs further education   HOME EXERCISE PROGRAM: To be established  GOALS: Goals reviewed with patient? Yes  SHORT TERM GOALS: Target date: 12/26/2021  Pt will perform initial HEP w/min A from family  for improved strength, balance, transfers and gait.  Baseline: not established on eval  Goal status: INITIAL  2.  MiniBest to be performed and STG/LTG written  Baseline:  Goal status: INITIAL  3.  10MWT to be performed and STG/LTG written  Baseline:  Goal status: INITIAL  4.  Pt will improve 5 x STS to less than or equal to 15 seconds without UE support to demonstrate improved functional strength and transfer efficiency.   Baseline: 18.5s w/BUE support on reps 1-3 and heavy posterior bracing  Goal status: INITIAL  5.  TUG to be performed and STG/LTG written  Baseline:  Goal status: INITIAL  6.  Pt and family will be able to teach back and demonstrate fall prevention techniques, including freezing strategies, for reduced fall risk and improved safety at home  Baseline:  Goal status: INITIAL  LONG TERM GOALS: Target date: 01/23/2022  Pt will perform final HEP w/min A from family for improved strength, balance, transfers and gait.  Baseline:  Goal status: INITIAL  2.  10MWT goal  Baseline:  Goal status: INITIAL  3.  TUG goal  Baseline:  Goal status: INITIAL  4.  MiniBest goal  Baseline:  Goal status: INITIAL  5.  Pt and family will verbalize understanding of local PD community resources, including fitness post DC.   Baseline:  Goal status: INITIAL   ASSESSMENT:  CLINICAL IMPRESSION: Patient is a 76 year old male referred to Neuro OPPT for PD.  Pt's PMH is significant for: Hyperlipidemia. The following deficits were present during the exam: decreased safety awareness, impaired working memory, decreased  balance, decreased strength and visual deficits. Based on fall history, 5x STS and impaired cognition, pt is an incr risk for falls. Will further assess balance next session. Pt would benefit from skilled PT to address these impairments and functional limitations to maximize functional mobility independence.     OBJECTIVE IMPAIRMENTS Abnormal gait, decreased balance, decreased cognition, decreased coordination, decreased knowledge of condition, decreased knowledge of use of DME, decreased mobility, difficulty walking, decreased safety awareness, impaired perceived functional ability, impaired vision/preception, and improper body mechanics.   ACTIVITY LIMITATIONS carrying, lifting, bending, standing, squatting, stairs, transfers, dressing, reach over head, hygiene/grooming, locomotion level, and caring for others  PARTICIPATION LIMITATIONS: meal prep, cleaning, laundry, medication management, personal finances, interpersonal relationship, driving, shopping, community activity, occupation, and yard work  PERSONAL FACTORS Age, Education, Fitness, Past/current experiences, Time since onset of injury/illness/exacerbation, and 1 comorbidity: Lewy Body Dementia  are also affecting patient's functional outcome.   REHAB POTENTIAL: Fair due to poor prognosis of diagnosis   CLINICAL DECISION MAKING: Evolving/moderate complexity  EVALUATION COMPLEXITY: Moderate  PLAN: PT FREQUENCY: 2x/week  PT DURATION: 8 weeks  PLANNED INTERVENTIONS: Therapeutic exercises, Therapeutic activity, Neuromuscular re-education, Balance training, Gait training, Patient/Family education, Self Care, Joint mobilization, Stair training, Vestibular training, Canalith repositioning, DME instructions, Aquatic Therapy, Dry Needling, Electrical stimulation, Cryotherapy, Moist heat, Manual therapy, and Re-evaluation  PLAN FOR NEXT SESSION: MMT, 10MWT, TUG, MiniBest. Initial HEP for deficits highlighted by outcome measures. Pt  requesting to walk on treadmill and do boxing - very active. Perform oculomotor screen    Lizzeth Meder E Champayne Kocian, PT, DPT 11/28/2021, 1:20 PM

## 2021-11-29 DIAGNOSIS — Z9181 History of falling: Secondary | ICD-10-CM | POA: Insufficient documentation

## 2021-11-29 DIAGNOSIS — R2681 Unsteadiness on feet: Secondary | ICD-10-CM | POA: Insufficient documentation

## 2021-11-29 NOTE — Telephone Encounter (Signed)
Orders Placed This Encounter  Procedures   Ambulatory referral to Occupational Therapy   Ambulatory referral to Speech Therapy

## 2021-12-03 ENCOUNTER — Ambulatory Visit: Payer: PPO | Admitting: Physical Therapy

## 2021-12-03 DIAGNOSIS — R2681 Unsteadiness on feet: Secondary | ICD-10-CM | POA: Diagnosis not present

## 2021-12-03 DIAGNOSIS — R293 Abnormal posture: Secondary | ICD-10-CM

## 2021-12-03 DIAGNOSIS — Z9181 History of falling: Secondary | ICD-10-CM

## 2021-12-03 NOTE — Therapy (Signed)
OUTPATIENT PHYSICAL THERAPY NEURO TREATMENT   Patient Name: Tony [name] MRN: [id number] DOB:1945-08-16, 76 y.o., male Today's Date: 12/03/2021   PCP: Prince Solian, MD  REFERRING PROVIDER: Marcial Pacas, MD    PT End of Session - 12/03/21 0849     Visit Number 2    Number of Visits 17   Plus eval   Date for PT Re-Evaluation 01/30/22    Authorization Type [employer] Part B    PT Start Time 0847    PT Stop Time 0932    PT Time Calculation (min) 45 min    Equipment Utilized During Treatment Gait belt    Activity Tolerance Patient tolerated treatment well    Behavior During Therapy WFL for tasks assessed/performed              Past Medical History:  Diagnosis Date   Anxiety    High cholesterol    Imbalance    Low back pain    Past Surgical History:  Procedure Laterality Date   CYST REMOVAL LEG  2014   Patient Active Problem List   Diagnosis Date Noted   History of falling 11/29/2021   Unsteadiness on feet 11/29/2021   Excessive sleepiness 11/08/2021   Cognitive impairment 12/25/2020   Anxiety 04/25/2020   Gait abnormality 03/30/2020   Parkinson's disease 03/30/2020   Memory loss 03/30/2020   Parkinsonism 03/30/2020   CELLULITIS AND ABSCESS OF FACE 02/01/2008   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/2006   HYPERLIPIDEMIA 06/30/2006   DIVERTICULOSIS, COLON 06/30/2006   SEBORRHEIC KERATOSIS 06/30/2006   BACK PAIN, LUMBAR 06/30/2006    ONSET DATE: 11/08/2021   REFERRING DIAG: G20 (ICD-10-CM) - Parkinson's disease R41.89 (ICD-10-CM) - Cognitive impairment F41.9 (ICD-10-CM) - Anxiety R26.9 (ICD-10-CM) - Gait abnormality G47.10 (ICD-10-CM) - Excessive sleepiness   THERAPY DIAG:  Unsteadiness on feet  Abnormal posture  History of falling  Rationale for Evaluation and Treatment Rehabilitation  SUBJECTIVE:                                                                                                                                                                                               SUBJECTIVE STATEMENT: Pt reports he has been going to the gym more recently, lifting free weights, biking and working with a trainer. No new falls. Pt's wife stayed in waiting room for session.    Pt accompanied by: self and wife in waiting room   PERTINENT HISTORY: DaTSCAN in July 2022 showed bilateral decreased radioactive tracing in the putamen and asymmetric decreased radiotracer activity in the head of the right caudate nucleus His cognitive  impairment progress quickly compared to the idiopathic typical Parkinson's patient, he seems to have less optimal response to Sinemet, differentiation diagnosis including idiopathic Parkinson's disease with cognitive impairment versus Lewy body dementia  PAIN:  Are you having pain? No  PRECAUTIONS: Fall  WEIGHT BEARING RESTRICTIONS No  FALLS: Has patient fallen in last 6 months? Yes. Number of falls ~6 or more   PLOF: Independent  PATIENT GOALS "Work on my dexterity"   OBJECTIVE:   DIAGNOSTIC FINDINGS: DaTSCAN in July 2022 showed bilateral decreased radioactive tracing in the putamen and asymmetric decreased radiotracer activity in the head of the right caudate nucleus   COGNITION: Overall cognitive status: Impaired Noted saccadic intrusions of L eye during eval   POSTURE: rounded shoulders, forward head, and flexed trunk    TODAY'S TREATMENT:  Therapeutic Activity   LOWER EXTREMITY MMT:  Tested in seated position   MMT Right Eval Left Eval  Hip flexion 4- 4-  Hip extension    Hip abduction 4 4  Hip adduction 4 4-  Hip internal rotation    Hip external rotation    Knee flexion 4+ 4-  Knee extension 4+ 4-  Ankle dorsiflexion 4 4  Ankle plantarflexion    Ankle inversion    Ankle eversion    (Blank rows = not tested)    [facility] PT Assessment - 12/03/21 0857       Ambulation/Gait   Gait velocity 32.8' over 13.07s = 2.5 ft/s w/hurrycane      Balance    Balance Assessed Yes      Standardized Balance Assessment   Standardized Balance Assessment Timed Up and Go Test;Mini-BESTest      Mini-BESTest   Compensatory Stepping Correction - Forward Moderate: More than one step is required to recover equilibrium   2 small steps   Compensatory Stepping Correction - Backward Moderate: More than one step is required to recover equilibrium   2 small steps   Compensatory Stepping Correction - Left Lateral Severe: Falls, or cannot step    Compensatory Stepping Correction - Right Lateral Moderate: Several steps to recover equilibrium   2 small steps   Stepping Corredtion Lateral - lowest score 0    Timed UP & GO with Dual Task Moderate: Dual Task affects either counting OR walking (>10%) when compared to the TUG without Dual Task.      Timed Up and Go Test   Normal TUG (seconds) 13.72   No AD   Cognitive TUG (seconds) 19.04   no AD   TUG Comments >10% difference indicates high fall risk              VESTIBULAR ASSESSMENT   OCULOMOTOR EXAM:   Ocular Alignment: normal   Ocular ROM:  limited superiorly    Spontaneous Nystagmus: left beating Gaze-Induced Nystagmus: left beating with left gaze and left beating with right gaze   Smooth Pursuits: saccades Saccades: dysmetria, hypometric/undershoots, and slow  Ther Ex   Practiced proper sit <>stand technique without UE support, x5 reps from mat and x5 from standard chair. Pt required min multimodal cues initially for proper body mechanics and sequencing. Added to HEP (se bolded below)    GAIT: Gait pattern: step through pattern, decreased arm swing- Left, decreased step length- Left, decreased stride length, decreased hip/knee flexion- Right, decreased hip/knee flexion- Left, decreased ankle dorsiflexion- Right, decreased ankle dorsiflexion- Left, shuffling, decreased trunk rotation, trunk flexed, poor foot clearance- Right, and poor foot clearance- Left Distance walked: Various clinic distances  Assistive device utilized:  hurrycane and None Level of assistance: CGA Comments: Noted maintained IR and extension of LUE, L hand maintained in a fist. No instability noted w/turns and pt able to ambulate short distances in clinic without AD without LOB.     PATIENT EDUCATION: Education details: Initial HEP, encouraged pt to bring cane with him to [facility] to walk around track, outcome measure results  Person educated: Patient, Spouse, and Child(ren) Education method: Explanation and Demonstration Education comprehension: verbalized understanding and needs further education   HOME EXERCISE PROGRAM: Access Code: [email or web address] URL: [email or web address] Date: 12/03/2021 Prepared by: Mickie Bail Owin Vignola  Exercises - Sit to Stand Without Arm Support  - 1 x daily - 7 x weekly - 3 sets - 10 reps    GOALS: Goals reviewed with patient? Yes  SHORT TERM GOALS: Target date: 12/26/2021  Pt will perform initial HEP w/min A from family  for improved strength, balance, transfers and gait.  Baseline: not established on eval  Goal status: INITIAL  2.  MiniBest to be performed and LTG written  Baseline:  Goal status: REVISED  3.  Pt will improve gait velocity to at least 2.9 ft/s with LRAD for improved gait efficiency and safety  Baseline: 2.5 ft/s w/hurrycane  Goal status: REVISED  4.  Pt will improve 5 x STS to less than or equal to 15 seconds without UE support to demonstrate improved functional strength and transfer efficiency.   Baseline: 18.5s w/BUE support on reps 1-3 and heavy posterior bracing  Goal status: INITIAL  5.  Pt will improve normal TUG to less than or equal to 11 seconds w/LRAD for improved functional mobility and decreased fall risk.  Baseline: 13.72s without AD Goal status: REVISED  6.  Pt and family will be able to teach back and demonstrate fall prevention techniques, including freezing strategies, for reduced fall risk and improved safety at home   Baseline:  Goal status: INITIAL  LONG TERM GOALS: Target date: 01/23/2022  Pt will perform final HEP w/min A from family for improved strength, balance, transfers and gait.  Baseline:  Goal status: INITIAL  2.  Pt will improve gait velocity to at least 3.2 ft/s w/LRAD for improved gait efficiency and safety  Baseline: 2.5 ft/s w/hurrycane  Goal status: REVISED  3.  Pt will improve cog TUG to less than or equal to 15 seconds for improved functional mobility and decreased fall risk.  Baseline: 19.04s without AD  Goal status: REVISED  4.  Mini Best goal  Baseline:  Goal status: REVISED  5.  Pt and family will verbalize understanding of local PD community resources, including fitness post DC.   Baseline:  Goal status: INITIAL   ASSESSMENT:  CLINICAL IMPRESSION: Emphasis of skilled PT session on assessing gait and balance and establishing initial HEP. Pt demonstrates >10% difference on normal and Cog Tug, indicative of high fall risk. Pt able to elicit stepping strategies in 2 steps or less in all directions except to the L side. Will finish minibest next session. Pt inquiring about walking without AD at the [facility], encouraged pt to bring his cane with him for safety. Continue POC.     OBJECTIVE IMPAIRMENTS Abnormal gait, decreased balance, decreased cognition, decreased coordination, decreased knowledge of condition, decreased knowledge of use of DME, decreased mobility, difficulty walking, decreased safety awareness, impaired perceived functional ability, impaired vision/preception, and improper body mechanics.   ACTIVITY LIMITATIONS carrying, lifting, bending, standing, squatting, stairs, transfers, dressing, reach over head, hygiene/grooming, locomotion level, and  caring for others  PARTICIPATION LIMITATIONS: meal prep, cleaning, laundry, medication management, personal finances, interpersonal relationship, driving, shopping, community activity, occupation, and yard  work  PERSONAL FACTORS Age, Education, Fitness, Past/current experiences, Time since onset of injury/illness/exacerbation, and 1 comorbidity: Lewy Body Dementia  are also affecting patient's functional outcome.   REHAB POTENTIAL: Fair due to poor prognosis of diagnosis   CLINICAL DECISION MAKING: Evolving/moderate complexity  EVALUATION COMPLEXITY: Moderate  PLAN: PT FREQUENCY: 2x/week  PT DURATION: 8 weeks  PLANNED INTERVENTIONS: Therapeutic exercises, Therapeutic activity, Neuromuscular re-education, Balance training, Gait training, Patient/Family education, Self Care, Joint mobilization, Stair training, Vestibular training, Canalith repositioning, DME instructions, Aquatic Therapy, Dry Needling, Electrical stimulation, Cryotherapy, Moist heat, Manual therapy, and Re-evaluation  PLAN FOR NEXT SESSION: finish MiniBest. Add to HEP for deficits highlighted by outcome measures. Pt requesting to walk on treadmill and do boxing - very active.    Cruzita Lederer Dariah Mcsorley, PT, DPT 12/03/2021, 12:07 PM

## 2021-12-05 ENCOUNTER — Ambulatory Visit: Payer: PPO | Admitting: Physical Therapy

## 2021-12-05 DIAGNOSIS — R2681 Unsteadiness on feet: Secondary | ICD-10-CM | POA: Diagnosis not present

## 2021-12-05 DIAGNOSIS — Z9181 History of falling: Secondary | ICD-10-CM

## 2021-12-05 DIAGNOSIS — R293 Abnormal posture: Secondary | ICD-10-CM

## 2021-12-05 NOTE — Therapy (Signed)
OUTPATIENT PHYSICAL THERAPY NEURO TREATMENT   Patient Name: Tony [name] MRN: [id number] DOB:10/17/1945, 76 y.o., male Today's Date: 12/05/2021   PCP: Prince Solian, MD  REFERRING PROVIDER: Marcial Pacas, MD    PT End of Session - 12/05/21 0931     Visit Number 3    Number of Visits 17   Plus eval   Date for PT Re-Evaluation 01/30/22    Authorization Type [employer] Part B    PT Start Time 0930    PT Stop Time 1015    PT Time Calculation (min) 45 min    Equipment Utilized During Treatment Gait belt    Activity Tolerance Patient tolerated treatment well    Behavior During Therapy WFL for tasks assessed/performed               Past Medical History:  Diagnosis Date   Anxiety    High cholesterol    Imbalance    Low back pain    Past Surgical History:  Procedure Laterality Date   CYST REMOVAL LEG  2014   Patient Active Problem List   Diagnosis Date Noted   History of falling 11/29/2021   Unsteadiness on feet 11/29/2021   Excessive sleepiness 11/08/2021   Cognitive impairment 12/25/2020   Anxiety 04/25/2020   Gait abnormality 03/30/2020   Parkinson's disease 03/30/2020   Memory loss 03/30/2020   Parkinsonism 03/30/2020   CELLULITIS AND ABSCESS OF FACE 02/01/2008   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/2006   HYPERLIPIDEMIA 06/30/2006   DIVERTICULOSIS, COLON 06/30/2006   SEBORRHEIC KERATOSIS 06/30/2006   BACK PAIN, LUMBAR 06/30/2006    ONSET DATE: 11/08/2021   REFERRING DIAG: G20 (ICD-10-CM) - Parkinson's disease R41.89 (ICD-10-CM) - Cognitive impairment F41.9 (ICD-10-CM) - Anxiety R26.9 (ICD-10-CM) - Gait abnormality G47.10 (ICD-10-CM) - Excessive sleepiness   THERAPY DIAG:  Unsteadiness on feet  Abnormal posture  History of falling  Rationale for Evaluation and Treatment Rehabilitation  SUBJECTIVE:                                                                                                                                                                                               SUBJECTIVE STATEMENT: Pt reports he did cycling yesterday and hit a PR for calories burned. Stumbled the other night while going to the bathroom in the dark, but was able to catch himself before he fell. Sit <>stands are going well.    Pt accompanied by: self and wife in waiting room   PERTINENT HISTORY: DaTSCAN in July 2022 showed bilateral decreased radioactive tracing in the putamen and asymmetric decreased radiotracer activity in  the head of the right caudate nucleus His cognitive impairment progress quickly compared to the idiopathic typical Parkinson's patient, he seems to have less optimal response to Sinemet, differentiation diagnosis including idiopathic Parkinson's disease with cognitive impairment versus Lewy body dementia  PAIN:  Are you having pain? No  PRECAUTIONS: Fall  WEIGHT BEARING RESTRICTIONS No  FALLS: Has patient fallen in last 6 months? Yes. Number of falls ~6 or more   PLOF: Independent  PATIENT GOALS "Work on my dexterity"   OBJECTIVE:   DIAGNOSTIC FINDINGS: DaTSCAN in July 2022 showed bilateral decreased radioactive tracing in the putamen and asymmetric decreased radiotracer activity in the head of the right caudate nucleus   COGNITION: Overall cognitive status: Impaired Noted saccadic intrusions of L eye during eval   POSTURE: rounded shoulders, forward head, and flexed trunk    TODAY'S TREATMENT:  Therapeutic Activity   [facility] PT Assessment - 12/05/21 0934       Mini-BESTest   Sit To Stand Normal: Comes to stand without use of hands and stabilizes independently.    Rise to Toes Moderate: Heels up, but not full range (smaller than when holding hands), OR noticeable instability for 3 s.    Stand on one leg (left) Moderate: < 20 s   <1s   Stand on one leg (right) Moderate: < 20 s   1s   Stand on one leg - lowest score 1    Compensatory Stepping Correction - Forward Moderate:  More than one step is required to recover equilibrium   2 small steps   Compensatory Stepping Correction - Backward Moderate: More than one step is required to recover equilibrium   2 small steps   Compensatory Stepping Correction - Left Lateral Severe: Falls, or cannot step    Compensatory Stepping Correction - Right Lateral Moderate: Several steps to recover equilibrium   2 small steps   Stepping Corredtion Lateral - lowest score 0    Stance - Feet together, eyes open, firm surface  Normal: 30s    Stance - Feet together, eyes closed, foam surface  Moderate: < 30s   11.65s   Incline - Eyes Closed Normal: Stands independently 30s and aligns with gravity    Change in Gait Speed Moderate: Unable to change walking speed or signs of imbalance    Walk with head turns - Horizontal Moderate: performs head turns with reduction in gait speed.    Walk with pivot turns Normal: Turns with feet close FAST (< 3 steps) with good balance.    Step over obstacles Moderate: Steps over box but touches box OR displays cautious behavior by slowing gait.    Timed UP & GO with Dual Task Moderate: Dual Task affects either counting OR walking (>10%) when compared to the TUG without Dual Task.    Mini-BEST total score 17            NMR  Added to HEP (see bolded below) for improved stepping strategies, lateral reaching and truncal rotation:  -Lateral step w/contralateral UE reach to post-it target, x15 per side, w/mod cues to maintain reciprocal pattern. Pt initially reaching prior to stepping, but with practice was able to step first. Noted pt reaching w/BUEs when stepping to left, but only LUE when stepping to the R.   -Alt retro step w/BUE support on counter, x10 per side. Min cues to shift weight onto back leg rather than bend forward and rely on BUEs. CGA throughout.   Ther Ex  SciFit multi-peaks level 6  for 8 minutes using BUE/BLEs for neural priming for reciprocal movement, dynamic cardiovascular conditioning  and increased amplitude of stepping. Pt requested handle on L side be significantly shorter than R, but noted L truncal lean throughout and min A to reposition pt in chair intermittently.    GAIT: Gait pattern: step through pattern, decreased arm swing- Left, decreased step length- Left, decreased stride length, decreased hip/knee flexion- Right, decreased hip/knee flexion- Left, decreased ankle dorsiflexion- Right, decreased ankle dorsiflexion- Left, shuffling, decreased trunk rotation, trunk flexed, poor foot clearance- Right, and poor foot clearance- Left Distance walked: Various clinic distances  Assistive device utilized:  hurrycane and None Level of assistance: CGA Comments: Noted maintained IR and extension of LUE, L hand maintained in a fist. Min cues for open L hand and to facilitate arm swing. No instability noted w/turns and pt able to ambulate short distances in clinic without AD without LOB.     PATIENT EDUCATION: Education details: Additions to HEP Person educated: Patient, Spouse, and Child(ren) Education method: Explanation, Demonstration, and Handouts Education comprehension: verbalized understanding and needs further education   HOME EXERCISE PROGRAM: Access Code: [email or web address] URL: [email or web address] Date: 12/03/2021 Prepared by: Mickie Bail Galileo Colello  Exercises - Sit to Stand Without Arm Support  - 1 x daily - 7 x weekly - 3 sets - 10 reps - Step sideways with arms reaching at counter   - 1 x daily - 7 x weekly - 3 sets - 10 reps - Alternating Step Backward with Support  - 1 x daily - 7 x weekly - 3 sets - 10 reps    GOALS: Goals reviewed with patient? Yes  SHORT TERM GOALS: Target date: 12/26/2021  Pt will perform initial HEP w/min A from family  for improved strength, balance, transfers and gait.  Baseline: not established on eval  Goal status: INITIAL  2.  Pt will improve MiniBest to 19/28 for decreased fall risk and improvement with compensatory  stepping strategies.   Baseline: 17/28 Goal status: REVISED  3.  Pt will improve gait velocity to at least 2.9 ft/s with LRAD for improved gait efficiency and safety  Baseline: 2.5 ft/s w/hurrycane  Goal status: REVISED  4.  Pt will improve 5 x STS to less than or equal to 15 seconds without UE support to demonstrate improved functional strength and transfer efficiency.   Baseline: 18.5s w/BUE support on reps 1-3 and heavy posterior bracing  Goal status: INITIAL  5.  Pt will improve normal TUG to less than or equal to 11 seconds w/LRAD for improved functional mobility and decreased fall risk.  Baseline: 13.72s without AD Goal status: REVISED  6.  Pt and family will be able to teach back and demonstrate fall prevention techniques, including freezing strategies, for reduced fall risk and improved safety at home  Baseline:  Goal status: INITIAL  LONG TERM GOALS: Target date: 01/23/2022  Pt will perform final HEP w/min A from family for improved strength, balance, transfers and gait.  Baseline:  Goal status: INITIAL  2.  Pt will improve gait velocity to at least 3.2 ft/s w/LRAD for improved gait efficiency and safety  Baseline: 2.5 ft/s w/hurrycane  Goal status: REVISED  3.  Pt will improve cog TUG to less than or equal to 15 seconds for improved functional mobility and decreased fall risk.  Baseline: 19.04s without AD  Goal status: REVISED  4.  Pt will improve MiniBest to 22/28 for decreased fall risk and improvement with compensatory stepping strategies.   Baseline:  17/28 Goal status: REVISED  5.  Pt and family will verbalize understanding of local PD community resources, including fitness post DC.   Baseline:  Goal status: INITIAL   ASSESSMENT:  CLINICAL IMPRESSION: Emphasis of skilled PT session on assessing balance, endurance and improving stepping strategies. Pt scored a 17/28 on MiniBEst, indicative of high fall risk. Pt demonstrated greatest challenge  w/stepping strategies and balance on unlevel surfaces. Pt also demonstrates visual acuity impairments that impact his depth perception, noted by his tripping over obstacle on MiniBest. Encouraged pt to install night lights in his bedroom to ensure safety at night when getting up to use restroom. Pt verbalized understanding. Continue POC.      OBJECTIVE IMPAIRMENTS Abnormal gait, decreased balance, decreased cognition, decreased coordination, decreased knowledge of condition, decreased knowledge of use of DME, decreased mobility, difficulty walking, decreased safety awareness, impaired perceived functional ability, impaired vision/preception, and improper body mechanics.   ACTIVITY LIMITATIONS carrying, lifting, bending, standing, squatting, stairs, transfers, dressing, reach over head, hygiene/grooming, locomotion level, and caring for others  PARTICIPATION LIMITATIONS: meal prep, cleaning, laundry, medication management, personal finances, interpersonal relationship, driving, shopping, community activity, occupation, and yard work  PERSONAL FACTORS Age, Education, Fitness, Past/current experiences, Time since onset of injury/illness/exacerbation, and 1 comorbidity: Lewy Body Dementia  are also affecting patient's functional outcome.   REHAB POTENTIAL: Fair due to poor prognosis of diagnosis   CLINICAL DECISION MAKING: Evolving/moderate complexity  EVALUATION COMPLEXITY: Moderate  PLAN: PT FREQUENCY: 2x/week  PT DURATION: 8 weeks  PLANNED INTERVENTIONS: Therapeutic exercises, Therapeutic activity, Neuromuscular re-education, Balance training, Gait training, Patient/Family education, Self Care, Joint mobilization, Stair training, Vestibular training, Canalith repositioning, DME instructions, Aquatic Therapy, Dry Needling, Electrical stimulation, Cryotherapy, Moist heat, Manual therapy, and Re-evaluation  PLAN FOR NEXT SESSION:  Add to HEP for deficits highlighted by outcome measures. Pt  requesting to walk on treadmill and do boxing - very active. Slam balls, cone taps    Lashun Ramseyer E Antoine Fiallos, PT, DPT 12/05/2021, 10:18 AM

## 2021-12-10 ENCOUNTER — Ambulatory Visit: Payer: PPO | Admitting: Physical Therapy

## 2021-12-10 DIAGNOSIS — R2681 Unsteadiness on feet: Secondary | ICD-10-CM | POA: Diagnosis not present

## 2021-12-10 DIAGNOSIS — R2689 Other abnormalities of gait and mobility: Secondary | ICD-10-CM

## 2021-12-10 DIAGNOSIS — R293 Abnormal posture: Secondary | ICD-10-CM

## 2021-12-10 NOTE — Therapy (Signed)
OUTPATIENT PHYSICAL THERAPY NEURO TREATMENT   Patient Name: Tony [name] MRN: [id number] DOB:07/27/1945, 76 y.o., male Today's Date: 12/10/2021   PCP: Prince Solian, MD  REFERRING PROVIDER: Marcial Pacas, MD    PT End of Session - 12/10/21 1452     Visit Number 4    Number of Visits 17   Plus eval   Date for PT Re-Evaluation 01/30/22    Authorization Type [employer] Part B    PT Start Time 2683    PT Stop Time 1529    PT Time Calculation (min) 40 min    Equipment Utilized During Treatment Gait belt    Activity Tolerance Patient tolerated treatment well    Behavior During Therapy WFL for tasks assessed/performed                Past Medical History:  Diagnosis Date   Anxiety    High cholesterol    Imbalance    Low back pain    Past Surgical History:  Procedure Laterality Date   CYST REMOVAL LEG  2014   Patient Active Problem List   Diagnosis Date Noted   History of falling 11/29/2021   Unsteadiness on feet 11/29/2021   Excessive sleepiness 11/08/2021   Cognitive impairment 12/25/2020   Anxiety 04/25/2020   Gait abnormality 03/30/2020   Parkinson's disease 03/30/2020   Memory loss 03/30/2020   Parkinsonism 03/30/2020   CELLULITIS AND ABSCESS OF FACE 02/01/2008   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/2006   HYPERLIPIDEMIA 06/30/2006   DIVERTICULOSIS, COLON 06/30/2006   SEBORRHEIC KERATOSIS 06/30/2006   BACK PAIN, LUMBAR 06/30/2006    ONSET DATE: 11/08/2021   REFERRING DIAG: G20 (ICD-10-CM) - Parkinson's disease R41.89 (ICD-10-CM) - Cognitive impairment F41.9 (ICD-10-CM) - Anxiety R26.9 (ICD-10-CM) - Gait abnormality G47.10 (ICD-10-CM) - Excessive sleepiness   THERAPY DIAG:  Unsteadiness on feet  Abnormal posture  Other abnormalities of gait and mobility  Rationale for Evaluation and Treatment Rehabilitation  SUBJECTIVE:                                                                                                                                                                                               SUBJECTIVE STATEMENT: Pt reports his HEP wipes him out, especially the retro stepping. No new falls or changes to report.    Pt accompanied by: self and wife in waiting room   PERTINENT HISTORY: DaTSCAN in July 2022 showed bilateral decreased radioactive tracing in the putamen and asymmetric decreased radiotracer activity in the head of the right caudate nucleus His cognitive impairment progress quickly compared to the idiopathic typical  Parkinson's patient, he seems to have less optimal response to Sinemet, differentiation diagnosis including idiopathic Parkinson's disease with cognitive impairment versus Lewy body dementia  PAIN:  Are you having pain? No  PRECAUTIONS: Fall  WEIGHT BEARING RESTRICTIONS No  FALLS: Has patient fallen in last 6 months? Yes. Number of falls ~6 or more   PLOF: Independent  PATIENT GOALS "Work on my dexterity"   OBJECTIVE:   DIAGNOSTIC FINDINGS: DaTSCAN in July 2022 showed bilateral decreased radioactive tracing in the putamen and asymmetric decreased radiotracer activity in the head of the right caudate nucleus   COGNITION: Overall cognitive status: Impaired Noted saccadic intrusions of L eye during eval   POSTURE: rounded shoulders, forward head, and flexed trunk    TODAY'S TREATMENT:  Ther Ex  SciFit multi-peaks level 4 for 8 minutes using BUE/BLEs for neural priming for reciprocal movement, dynamic cardiovascular conditioning and increased amplitude of stepping. Noted L truncal lean throughout and provided min A to reposition pt in chair intermittently. Pt reported feeling as though the L handle is too long, even though placed at the same length as the R handle. RPE of 7/10 following exercise    NMR  In // bars for improved LE coordination, step clearance, foot placement and single leg stability: Alt step over 4" foam beam to colored dot target  w/BUE support, x10 per side. Noted decreased step clearance w/LLE > RLE. Progressed to single UE support only and pt required min A for lateral weight shift to R side w/each step w/LLE.  Progressed to adding cog dual-task (naming veggies when stepping w/LLE and fruits with RLE) and pt unable to recall veggies but was able to name fruits. Continued to provide min A for lateral weight shift to R side  On airex, alt cone taps w/BUE support, progressing to single UE support. Noted pt had significant difficulty locating cone, indicative of poor depth perception and visual scanning. Provided min A at pelvis for lateral weight shift to R side when attempting to step w/LLE, as pt unable to weight shift well and exhibits left truncal lean. Pt verbalized frustration with inability to unweight LLE and visualize cone.   GAIT: Gait pattern: step through pattern, decreased arm swing- Left, decreased step length- Left, decreased stride length, decreased hip/knee flexion- Right, decreased hip/knee flexion- Left, decreased ankle dorsiflexion- Right, decreased ankle dorsiflexion- Left, shuffling, decreased trunk rotation, trunk flexed, poor foot clearance- Right, and poor foot clearance- Left Distance walked: Various clinic distances  Assistive device utilized:  hurrycane and None Level of assistance: CGA Comments: Noted maintained IR and extension of LUE, L hand maintained in a fist. Min cues for open L hand and to facilitate arm swing. No instability noted w/turns and pt able to ambulate short distances in clinic without AD without LOB.     PATIENT EDUCATION: Education details: Continue HEP, reducing number of reps on HEP  Person educated: Patient, Spouse, and Child(ren) Education method: Explanation, Demonstration, and Handouts Education comprehension: verbalized understanding and needs further education   HOME EXERCISE PROGRAM: Access Code: [email or web address] URL: [email or web address] Date:  12/03/2021 Prepared by: Mickie Bail Lyndell Allaire  Exercises - Sit to Stand Without Arm Support  - 1 x daily - 7 x weekly - 3 sets - 10 reps - Step sideways with arms reaching at counter   - 1 x daily - 7 x weekly - 3 sets - 10 reps - Alternating Step Backward with Support  - 1 x daily - 7 x weekly - 3 sets - 10  reps    GOALS: Goals reviewed with patient? Yes  SHORT TERM GOALS: Target date: 12/26/2021  Pt will perform initial HEP w/min A from family  for improved strength, balance, transfers and gait.  Baseline: not established on eval  Goal status: INITIAL  2.  Pt will improve MiniBest to 19/28 for decreased fall risk and improvement with compensatory stepping strategies.   Baseline: 17/28 Goal status: REVISED  3.  Pt will improve gait velocity to at least 2.9 ft/s with LRAD for improved gait efficiency and safety  Baseline: 2.5 ft/s w/hurrycane  Goal status: REVISED  4.  Pt will improve 5 x STS to less than or equal to 15 seconds without UE support to demonstrate improved functional strength and transfer efficiency.   Baseline: 18.5s w/BUE support on reps 1-3 and heavy posterior bracing  Goal status: INITIAL  5.  Pt will improve normal TUG to less than or equal to 11 seconds w/LRAD for improved functional mobility and decreased fall risk.  Baseline: 13.72s without AD Goal status: REVISED  6.  Pt and family will be able to teach back and demonstrate fall prevention techniques, including freezing strategies, for reduced fall risk and improved safety at home  Baseline:  Goal status: INITIAL  LONG TERM GOALS: Target date: 01/23/2022  Pt will perform final HEP w/min A from family for improved strength, balance, transfers and gait.  Baseline:  Goal status: INITIAL  2.  Pt will improve gait velocity to at least 3.2 ft/s w/LRAD for improved gait efficiency and safety  Baseline: 2.5 ft/s w/hurrycane  Goal status: REVISED  3.  Pt will improve cog TUG to less than or equal to 15  seconds for improved functional mobility and decreased fall risk.  Baseline: 19.04s without AD  Goal status: REVISED  4.  Pt will improve MiniBest to 22/28 for decreased fall risk and improvement with compensatory stepping strategies.   Baseline: 17/28 Goal status: REVISED  5.  Pt and family will verbalize understanding of local PD community resources, including fitness post DC.   Baseline:  Goal status: INITIAL   ASSESSMENT:  CLINICAL IMPRESSION: Emphasis of skilled PT session on lateral weight shifting, single leg stability and increased step clearance. Pt continues to demonstrate difficulty weight shifting to R side and demonstrates significant lateral lean to L side that is not corrected by visual or tactile cues. Pt verbalized frustration with difficulty unweighting LLE and seems unaware of his truncal lean to L side. Continue POC.       OBJECTIVE IMPAIRMENTS Abnormal gait, decreased balance, decreased cognition, decreased coordination, decreased knowledge of condition, decreased knowledge of use of DME, decreased mobility, difficulty walking, decreased safety awareness, impaired perceived functional ability, impaired vision/preception, and improper body mechanics.   ACTIVITY LIMITATIONS carrying, lifting, bending, standing, squatting, stairs, transfers, dressing, reach over head, hygiene/grooming, locomotion level, and caring for others  PARTICIPATION LIMITATIONS: meal prep, cleaning, laundry, medication management, personal finances, interpersonal relationship, driving, shopping, community activity, occupation, and yard work  PERSONAL FACTORS Age, Education, Fitness, Past/current experiences, Time since onset of injury/illness/exacerbation, and 1 comorbidity: Lewy Body Dementia  are also affecting patient's functional outcome.   REHAB POTENTIAL: Fair due to poor prognosis of diagnosis   CLINICAL DECISION MAKING: Evolving/moderate complexity  EVALUATION COMPLEXITY:  Moderate  PLAN: PT FREQUENCY: 2x/week  PT DURATION: 8 weeks  PLANNED INTERVENTIONS: Therapeutic exercises, Therapeutic activity, Neuromuscular re-education, Balance training, Gait training, Patient/Family education, Self Care, Joint mobilization, Stair training, Vestibular training, Canalith repositioning, DME instructions, Aquatic Therapy,  Dry Needling, Electrical stimulation, Cryotherapy, Moist heat, Manual therapy, and Re-evaluation  PLAN FOR NEXT SESSION:  Add to HEP for deficits highlighted by outcome measures. Pt requesting to walk on treadmill and do boxing - very active. Slam balls, cone taps, lateral weight shifting to L side    Montrelle Eddings E Shyvonne Chastang, PT, DPT 12/10/2021, 3:30 PM

## 2021-12-12 ENCOUNTER — Ambulatory Visit: Payer: PPO | Admitting: Neurology

## 2021-12-12 ENCOUNTER — Ambulatory Visit: Payer: PPO | Admitting: Physical Therapy

## 2021-12-12 ENCOUNTER — Encounter: Payer: Self-pay | Admitting: Neurology

## 2021-12-12 VITALS — BP 120/71 | HR 71 | Ht 71.0 in | Wt 203.0 lb

## 2021-12-12 DIAGNOSIS — R0683 Snoring: Secondary | ICD-10-CM

## 2021-12-12 DIAGNOSIS — G20C Parkinsonism, unspecified: Secondary | ICD-10-CM | POA: Diagnosis not present

## 2021-12-12 DIAGNOSIS — R413 Other amnesia: Secondary | ICD-10-CM

## 2021-12-12 DIAGNOSIS — E663 Overweight: Secondary | ICD-10-CM

## 2021-12-12 DIAGNOSIS — R2681 Unsteadiness on feet: Secondary | ICD-10-CM | POA: Diagnosis not present

## 2021-12-12 DIAGNOSIS — R351 Nocturia: Secondary | ICD-10-CM | POA: Diagnosis not present

## 2021-12-12 DIAGNOSIS — Z820 Family history of epilepsy and other diseases of the nervous system: Secondary | ICD-10-CM | POA: Diagnosis not present

## 2021-12-12 DIAGNOSIS — R293 Abnormal posture: Secondary | ICD-10-CM

## 2021-12-12 DIAGNOSIS — G4719 Other hypersomnia: Secondary | ICD-10-CM | POA: Diagnosis not present

## 2021-12-12 DIAGNOSIS — R2689 Other abnormalities of gait and mobility: Secondary | ICD-10-CM

## 2021-12-12 NOTE — Progress Notes (Signed)
Subjective:    Patient ID: Tony [name] is a 76 y.o. male.  HPI    Star Age, MD, PhD [facility] [address], [address]O. [place], [place] [address]  Dear Aliene Beams,  I saw your patient, Tony [name], upon your kind request in my sleep clinic today for initial consultation of his sleep disorder, in particular, concern for underlying obstructive sleep apnea.  The patient is accompanied by his wife today.  As you know, Tony [name] is a 76 year old male with an underlying medical history of parkinsonism, memory loss, hyperlipidemia, low back pain, anxiety, and overweight state, who reports snoring and excessive daytime somnolence.  I reviewed your office note from 11/08/2021.  His Epworth sleepiness score is 14 out of 24, fatigue severity score is 37 out of 63. He has had trouble falling asleep at night and has been on trazodone 50 mg each night, he takes it almost every night.  He goes to bed around 10 PM and rise time varies, is up by 730 usually and eats breakfast and goes back to bed to sleep until 9:30 AM or 10 AM.  He also takes a nap in the afternoon typically.  He lives with his wife, they have 5 grown children, 4 daughters and 1 son.  He denies recurrent nocturnal or morning headaches, he has nocturia about once per average night.  His sister has sleep apnea.  He has fallen.  He has no history of smoking.  He does not drink alcohol regularly, but used to drink regularly.  He used to work in [employer].  He had a tonsillectomy as a child.  They have no pets in the household.  He drinks caffeine in the form of [facility], 112 ounce bottle per day on average.  His Past Medical History Is Significant For: Past Medical History:  Diagnosis Date   Anxiety    Boil of buttock    High cholesterol    Imbalance    Low back pain     His Past Surgical History Is Significant For: Past Surgical History:  Procedure Laterality Date   CYST REMOVAL LEG  2014    His  Family History Is Significant For: Family History  Problem Relation Age of Onset   Stroke Mother    Heart disease Father    Heart attack Paternal Grandfather    Sleep apnea Neg Hx     His Social History Is Significant For: Social History   Socioeconomic History   Marital status: Married    Spouse name: Not on file   Number of children: 5   Years of education: college   Highest education level: Bachelor's degree (e.g., BA, AB, BS)  Occupational History   Occupation: Retired  Tobacco Use   Smoking status: Never   Smokeless tobacco: Current  Substance and Sexual Activity   Alcohol use: Yes    Comment: one beer weekly   Drug use: Never   Sexual activity: Not on file  Other Topics Concern   Not on file  Social History Narrative   Lives at home with his wife.   Right-handed.   Caffeine use: recently discontinued daily use of [facility].   Social Determinants of Health   Financial Resource Strain: Not on file  Food Insecurity: Not on file  Transportation Needs: Not on file  Physical Activity: Not on file  Stress: Not on file  Social Connections: Not on file    His Allergies Are:  Allergies  Allergen  Reactions   Shellfish Allergy Hives and Swelling  :   His Current Medications Are:  Outpatient Encounter Medications as of 12/12/2021  Medication Sig   atorvastatin (LIPITOR) 10 MG tablet Take 10 mg by mouth daily.   carbidopa-levodopa (SINEMET IR) 25-100 MG tablet Take 2 tablets by mouth 3 (three) times daily. (Patient taking differently: Take 1.5 tablets by mouth 3 (three) times daily.)   citalopram (CELEXA) 10 MG tablet Take 2 tablets (20 mg total) by mouth daily.   donepezil (ARICEPT) 10 MG tablet Take 1 tablet (10 mg total) by mouth in the morning.   fexofenadine (ALLEGRA) 60 MG tablet Take 60 mg by mouth 2 (two) times daily.   Multiple Vitamin (MULTI-VITAMIN DAILY PO) Take 1 tablet by mouth daily.   traZODone (DESYREL) 50 MG tablet    No facility-administered  encounter medications on file as of 12/12/2021.  :   Review of Systems:  Out of a complete 14 point review of systems, all are reviewed and negative with the exception of these symptoms as listed below:  Review of Systems  Neurological:        Pt here for sleep consult Pt snores,fatigue Pt denies headaches,hypertension,Sleep study,CPAP machine      ESS:14 FSS; 37    Objective:  Neurological Exam  Physical Exam Physical Examination:   Vitals:   12/12/21 1106  BP: 120/71  Pulse: 71    General Examination: The patient is a very pleasant 76 y.o. male in no acute distress. He appears well-developed and well-nourished and well groomed.   HEENT: Normocephalic, atraumatic, pupils are equal, round and reactive to light, status post cataract repairs, extraocular tracking is impaired with limitation to downgaze, intermittent nystagmus noted.  Face is symmetric with moderate facial masking.  No carotid bruits.  Speech is moderately hypophonic, no dysarthria noted.  Airway examination reveals mild mouth dryness, small airway entry, Mallampati class I, tonsils absent.  Tongue protrudes centrally and palate elevates symmetrically.  Neck circumference of 16 3/8 inches.  He does not have much in the way of overbite, skewed teeth alignment.    Chest: Clear to auscultation without wheezing, rhonchi or crackles noted.  Heart: S1+S2+0, regular and normal without murmurs, rubs or gallops noted.   Abdomen: Soft, non-tender and non-distended.  Extremities: There is trace pitting edema in the distal lower extremities bilaterally.   Skin: Warm and dry without trophic changes noted.   Musculoskeletal: exam reveals no obvious joint deformities.   Neurologically:  Mental status: The patient is awake, alert and oriented in all 4 spheres. His immediate and remote memory, attention, language skills and fund of knowledge are appropriate. There is no evidence of aphasia, agnosia, apraxia or anomia.  Speech is clear with normal prosody and enunciation. Thought process is linear. Mood is normal and affect is normal.  Cranial nerves II - XII are as described above under HEENT exam.  Motor exam: Normal bulk, moves all 4 extremities, moderate impairment of fine motor skills, no obvious resting tremor.  Moderate slowness noted. Cerebellar testing: No dysmetria or intention tremor. There is no truncal or gait ataxia.  Sensory exam: intact to light touch in the upper and lower extremities.  Gait, station and balance: He stands with mild difficulty, moderately stooped posture noted, walks with a single-point cane.   Assessment and Plan:  In summary, Tony [name] is a very pleasant 76 y.o.-year old male with an underlying medical history of parkinsonism, memory loss, hyperlipidemia, low back pain, anxiety, and overweight state,  whose history and physical exam are concerning for sleep disordered breathing, supporting a current working diagnosis of unspecified sleep apnea, with the main differential diagnoses of obstructive sleep apnea (OSA) versus upper airway resistance syndrome (UARS) versus central sleep apnea (CSA), or mixed sleep apnea. A laboratory attended sleep study is considered gold standard for evaluation of sleep disordered breathing and is recommended at this time and clinically justified.   I had a long chat with the patient and his wife about my findings and the diagnosis of sleep apnea, particularly OSA, its prognosis and treatment options. We talked about medical/conservative treatments, surgical interventions and non-pharmacological approaches for symptom control. I explained, in particular, the risks and ramifications of untreated moderate to severe OSA, especially with respect to developing cardiovascular disease down the road, including congestive heart failure (CHF), difficult to treat hypertension, cardiac arrhythmias (particularly A-fib), neurovascular complications including TIA, stroke  and dementia. Even type 2 diabetes has, in part, been linked to untreated OSA. Symptoms of untreated OSA may include (but may not be limited to) daytime sleepiness, nocturia (i.e. frequent nighttime urination), memory problems, mood irritability and suboptimally controlled or worsening mood disorder such as depression and/or anxiety, lack of energy, lack of motivation, physical discomfort, as well as recurrent headaches, especially morning or nocturnal headaches.  I recommended the following at this time: sleep study.  I outlined the differences between a laboratory attended sleep study which is considered more comprehensive and accurate over the option of a home sleep test (HST); the latter may lead to underestimation of sleep disordered breathing in some instances and does not help with diagnosing upper airway resistance syndrome and is not accurate enough to diagnose primary central sleep apnea typically. I explained the different sleep test procedures to the patient in detail and also outlined possible surgical and non-surgical treatment options of OSA.  We mutually agreed to not pursue any surgical options and he may not be a good candidate for dental device either.  We will pursue positive airway pressure treatment if he qualifies. I explained the PAP treatment option to the patient in detail, as this is generally considered first-line treatment.  The patient indicated that he would be willing to try PAP therapy, if the need arises.  He does indicate that he is claustrophobic.  We will pick up our discussion about the next steps and treatment options after testing.  We will keep them posted as to the test results by phone call and/or MyChart messaging where possible.  We will plan to follow-up in sleep clinic accordingly as well.  I answered all their questions today and the patient and his wife were in agreement.   I encouraged them to call with any interim questions, concerns, problems or updates or  email [place] through [employer].  Generally speaking, sleep test authorizations may take up to 2 weeks, sometimes less, sometimes longer, the patient is encouraged to get in touch with [place] if they do not hear back from the sleep lab staff directly within the next 2 weeks.  Thank you very much for allowing me to participate in the care of this nice patient. If I can be of any further assistance to you please do not hesitate to call me at [phone].  Sincerely,   Star Age, MD, PhD

## 2021-12-12 NOTE — Therapy (Signed)
OUTPATIENT PHYSICAL THERAPY NEURO TREATMENT   Patient Name: Tony [name] MRN: [id number] DOB:05-25-1945, 76 y.o., male Today's Date: 12/12/2021   PCP: Prince Solian, MD  REFERRING PROVIDER: Marcial Pacas, MD    PT End of Session - 12/12/21 1407     Visit Number 5    Number of Visits 17   Plus eval   Date for PT Re-Evaluation 01/30/22    Authorization Type [employer] Part B    PT Start Time 1404    PT Stop Time 1445    PT Time Calculation (min) 41 min    Equipment Utilized During Treatment Gait belt    Activity Tolerance Patient tolerated treatment well    Behavior During Therapy WFL for tasks assessed/performed                 Past Medical History:  Diagnosis Date   Anxiety    Boil of buttock    High cholesterol    Imbalance    Low back pain    Past Surgical History:  Procedure Laterality Date   CYST REMOVAL LEG  2014   Patient Active Problem List   Diagnosis Date Noted   History of falling 11/29/2021   Unsteadiness on feet 11/29/2021   Excessive sleepiness 11/08/2021   Cognitive impairment 12/25/2020   Anxiety 04/25/2020   Gait abnormality 03/30/2020   Parkinson's disease 03/30/2020   Memory loss 03/30/2020   Parkinsonism 03/30/2020   CELLULITIS AND ABSCESS OF FACE 02/01/2008   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/2006   HYPERLIPIDEMIA 06/30/2006   DIVERTICULOSIS, COLON 06/30/2006   SEBORRHEIC KERATOSIS 06/30/2006   BACK PAIN, LUMBAR 06/30/2006    ONSET DATE: 11/08/2021   REFERRING DIAG: G20 (ICD-10-CM) - Parkinson's disease R41.89 (ICD-10-CM) - Cognitive impairment F41.9 (ICD-10-CM) - Anxiety R26.9 (ICD-10-CM) - Gait abnormality G47.10 (ICD-10-CM) - Excessive sleepiness   THERAPY DIAG:  Unsteadiness on feet  Abnormal posture  Other abnormalities of gait and mobility  Rationale for Evaluation and Treatment Rehabilitation  SUBJECTIVE:                                                                                                                                                                                               SUBJECTIVE STATEMENT: Pt presents very fatigued, had sleep study earlier in the day and has been running errands ever since. No new changes.    Pt accompanied by: self and wife in waiting room   PERTINENT HISTORY: DaTSCAN in July 2022 showed bilateral decreased radioactive tracing in the putamen and asymmetric decreased radiotracer activity in the head of the right caudate nucleus  His cognitive impairment progress quickly compared to the idiopathic typical Parkinson's patient, he seems to have less optimal response to Sinemet, differentiation diagnosis including idiopathic Parkinson's disease with cognitive impairment versus Lewy body dementia  PAIN:  Are you having pain? No  PRECAUTIONS: Fall  WEIGHT BEARING RESTRICTIONS No  FALLS: Has patient fallen in last 6 months? Yes. Number of falls ~6 or more   PLOF: Independent  PATIENT GOALS "Work on my dexterity"   OBJECTIVE:   DIAGNOSTIC FINDINGS: DaTSCAN in July 2022 showed bilateral decreased radioactive tracing in the putamen and asymmetric decreased radiotracer activity in the head of the right caudate nucleus   COGNITION: Overall cognitive status: Impaired Noted saccadic intrusions of L eye during eval   POSTURE: rounded shoulders, forward head, and flexed trunk    TODAY'S TREATMENT:   Self-care/home management  Pt requested printouts of the notes from his sessions thus far due to not being able to access MyChart, so provided for pt at beginning of session    NMR  In // bars for improved LE coordination, step clearance, foot placement and lateral weight shifting Per pt request, repeated standing on airex, alt cone taps w/BUE support, using mirror for visual biofeedback. Used red cone rather than yellow cone today, and pt reported he was able to visualize this cone better. Pt able to accurately tap cone w/BLEs  when holding onto rail w/BUEs, but when progressing to single UE support, pt unable to tap cone w/LLE and noted significant midline crossing w/leg. Pt reported he has new onset double vision and is unable to tell where cone is located w/LLE.  On small rockerboard in L/R direction, lateral weight shifts w/BUE support and progressing to no UE support. Without UE support, pt required min A to facilitate shift to L side, but with practice was able to facilitate on his own. Pt continues to demonstrate truncal lean to R side even w/visual biofeedback on body position.   Ther Ex  SciFit multi-peaks level 5 for 8 minutes using BUE/BLEs for neural priming for reciprocal movement, dynamic cardiovascular conditioning and increased amplitude of stepping. Min cues to sit fully in chair, as pt tends to sit too far on L side and requires min A to bring hips fully into seat. Pt continues to report feeling as though the L handle is too long, even though placed at the same length as the R handle. RPE of 8/10 following exercise   GAIT: Gait pattern: step through pattern, decreased arm swing- Left, decreased step length- Left, decreased stride length, decreased hip/knee flexion- Right, decreased hip/knee flexion- Left, decreased ankle dorsiflexion- Right, decreased ankle dorsiflexion- Left, shuffling, decreased trunk rotation, trunk flexed, poor foot clearance- Right, and poor foot clearance- Left Distance walked: Various clinic distances  Assistive device utilized:  hurrycane and None Level of assistance: CGA Comments: Noted maintained IR and extension of LUE, L hand maintained in a fist. Min cues for open L hand and to facilitate arm swing. No instability noted w/turns and pt able to ambulate short distances in clinic without AD without LOB.   Escorted pt out of clinic w/CGA as pt insistent on not using cane following session. Min A required when pt turned too quickly out of clinic door, losing his balance to R side. Pt  instructed to use cane remainder of distance and provided handoff w/wife     PATIENT EDUCATION: Education details: Continue HEP Person educated: Patient, Spouse, and Child(ren) Education method: Explanation, Demonstration, and Handouts Education comprehension: verbalized understanding  and needs further education   HOME EXERCISE PROGRAM: Access Code: [email or web address] URL: [email or web address] Date: 12/03/2021 Prepared by: Mickie Bail Munirah Doerner  Exercises - Sit to Stand Without Arm Support  - 1 x daily - 7 x weekly - 3 sets - 10 reps - Step sideways with arms reaching at counter   - 1 x daily - 7 x weekly - 3 sets - 10 reps - Alternating Step Backward with Support  - 1 x daily - 7 x weekly - 3 sets - 10 reps    GOALS: Goals reviewed with patient? Yes  SHORT TERM GOALS: Target date: 12/26/2021  Pt will perform initial HEP w/min A from family  for improved strength, balance, transfers and gait.  Baseline: not established on eval  Goal status: INITIAL  2.  Pt will improve MiniBest to 19/28 for decreased fall risk and improvement with compensatory stepping strategies.   Baseline: 17/28 Goal status: REVISED  3.  Pt will improve gait velocity to at least 2.9 ft/s with LRAD for improved gait efficiency and safety  Baseline: 2.5 ft/s w/hurrycane  Goal status: REVISED  4.  Pt will improve 5 x STS to less than or equal to 15 seconds without UE support to demonstrate improved functional strength and transfer efficiency.   Baseline: 18.5s w/BUE support on reps 1-3 and heavy posterior bracing  Goal status: INITIAL  5.  Pt will improve normal TUG to less than or equal to 11 seconds w/LRAD for improved functional mobility and decreased fall risk.  Baseline: 13.72s without AD Goal status: REVISED  6.  Pt and family will be able to teach back and demonstrate fall prevention techniques, including freezing strategies, for reduced fall risk and improved safety at home  Baseline:   Goal status: INITIAL  LONG TERM GOALS: Target date: 01/23/2022  Pt will perform final HEP w/min A from family for improved strength, balance, transfers and gait.  Baseline:  Goal status: INITIAL  2.  Pt will improve gait velocity to at least 3.2 ft/s w/LRAD for improved gait efficiency and safety  Baseline: 2.5 ft/s w/hurrycane  Goal status: REVISED  3.  Pt will improve cog TUG to less than or equal to 15 seconds for improved functional mobility and decreased fall risk.  Baseline: 19.04s without AD  Goal status: REVISED  4.  Pt will improve MiniBest to 22/28 for decreased fall risk and improvement with compensatory stepping strategies.   Baseline: 17/28 Goal status: REVISED  5.  Pt and family will verbalize understanding of local PD community resources, including fitness post DC.   Baseline:  Goal status: INITIAL   ASSESSMENT:  CLINICAL IMPRESSION: Emphasis of skilled PT session on lateral weight shifting, LE coordination and endurance. Pt reported new onset of double vision that began about 3 weeks ago and plan to make appointment w/eye doctor. Noted scissoring of LLE throughout session w/impaired depth perception when tapping w/LLE that is absent when tapping w/RLE. Continue POC.   OBJECTIVE IMPAIRMENTS Abnormal gait, decreased balance, decreased cognition, decreased coordination, decreased knowledge of condition, decreased knowledge of use of DME, decreased mobility, difficulty walking, decreased safety awareness, impaired perceived functional ability, impaired vision/preception, and improper body mechanics.   ACTIVITY LIMITATIONS carrying, lifting, bending, standing, squatting, stairs, transfers, dressing, reach over head, hygiene/grooming, locomotion level, and caring for others  PARTICIPATION LIMITATIONS: meal prep, cleaning, laundry, medication management, personal finances, interpersonal relationship, driving, shopping, community activity, occupation, and yard  work  PERSONAL FACTORS Age, Education, Fitness, Past/current experiences, Time since onset of  injury/illness/exacerbation, and 1 comorbidity: Lewy Body Dementia  are also affecting patient's functional outcome.   REHAB POTENTIAL: Fair due to poor prognosis of diagnosis   CLINICAL DECISION MAKING: Evolving/moderate complexity  EVALUATION COMPLEXITY: Moderate  PLAN: PT FREQUENCY: 2x/week  PT DURATION: 8 weeks  PLANNED INTERVENTIONS: Therapeutic exercises, Therapeutic activity, Neuromuscular re-education, Balance training, Gait training, Patient/Family education, Self Care, Joint mobilization, Stair training, Vestibular training, Canalith repositioning, DME instructions, Aquatic Therapy, Dry Needling, Electrical stimulation, Cryotherapy, Moist heat, Manual therapy, and Re-evaluation  PLAN FOR NEXT SESSION:  Add to HEP for deficits highlighted by outcome measures. Pt requesting to walk on treadmill and do boxing - very active. Slam balls, cone taps, lateral weight shifting to L side, LE coordination, depth perception, VOR?    Cruzita Lederer Eddie Koc, PT, DPT 12/12/2021, 2:51 PM

## 2021-12-12 NOTE — Patient Instructions (Signed)

## 2021-12-13 ENCOUNTER — Telehealth: Payer: Self-pay | Admitting: Neurology

## 2021-12-13 NOTE — Telephone Encounter (Signed)
HTA Pending faxed notes EE

## 2021-12-14 NOTE — Therapy (Unsigned)
OUTPATIENT SPEECH LANGUAGE PATHOLOGY PARKINSON'S EVALUATION   Patient Name: Tony [name] MRN: [id number] DOB:[birth year], 76 y.o., male Today's Date: 12/17/2021  PCP: Prince Solian, MD REFERRING PROVIDER: Marcial Pacas, MD    End of Session - 12/17/21 1018     Visit Number 1    Number of Visits 17    Date for SLP Re-Evaluation 02/11/22    Authorization Type [employer]    SLP Start Time 0845    SLP Stop Time  0931    SLP Time Calculation (min) 46 min    Activity Tolerance Patient tolerated treatment well             Past Medical History:  Diagnosis Date   Anxiety    Boil of buttock    High cholesterol    Imbalance    Low back pain    Past Surgical History:  Procedure Laterality Date   CYST REMOVAL LEG  2014   Patient Active Problem List   Diagnosis Date Noted   History of falling 11/29/2021   Unsteadiness on feet 11/29/2021   Excessive sleepiness 11/08/2021   Cognitive impairment 12/25/2020   Anxiety 04/25/2020   Gait abnormality 03/30/2020   Parkinson's disease 03/30/2020   Memory loss 03/30/2020   Parkinsonism 03/30/2020   CELLULITIS AND ABSCESS OF FACE 02/01/2008   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/2006   HYPERLIPIDEMIA 06/30/2006   DIVERTICULOSIS, COLON 06/30/2006   SEBORRHEIC KERATOSIS 06/30/2006   BACK PAIN, LUMBAR 06/30/2006    ONSET DATE: 11/29/2021 referral  REFERRING DIAG: R26.81 (ICD-10-CM) - Unsteadiness on feet Z91.81 (ICD-10-CM) - History of falling G20.C (ICD-10-CM) - Parkinsonism, unspecified Parkinsonism type   THERAPY DIAG:  Cognitive communication deficit  Dysarthria and anarthria  Dysphagia, unspecified type  Rationale for Evaluation and Treatment Rehabilitation  SUBJECTIVE:   SUBJECTIVE STATEMENT: "This all has hit me pretty hard" re: changes in cognition, physical abilities, and communication Pt accompanied by: significant other  PERTINENT HISTORY: Per Dr. Krista Blue PN "DaTSCAN in July 2022  showed bilateral decreased radioactive tracing in the putamen and asymmetric decreased radiotracer activity in the head of the right caudate nucleu. His cognitive impairment progress quickly compared to the idiopathic typical Parkinson's patient, he seems to have less optimal response to Sinemet, differentiation diagnosis including idiopathic Parkinson's disease with cognitive impairment versus Lewy body dementia"  PAIN:  Are you having pain? No  FALLS: Has patient fallen in last 6 months?  Yes, See PT evaluation for details  LIVING ENVIRONMENT: Lives with: lives with their spouse Lives in: House/apartment  PLOF:  Level of assistance: Independent with IADLs Employment: Retired  PATIENT GOALS: "get better what I can"   OBJECTIVE:   DIAGNOSTIC FINDINGS: 09-14-21 DaTscan IMPRESSION: Bilateral decreased radiotracer activity in the putamen and asymmetric decreased radiotracer activity in the head of the RIGHT caudate nucleus. 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  COGNITION: Overall cognitive status: Impaired Areas of impairment: Attention, Memory, and Problem solving Comments: Reporting decreased focus, delayed processing, impaired memory. Is no longer able to manage finances, medications, schedule and is no longer driving. Requires A from spouse and daughter for iADLs. Continues to have active social life: weekly dinner and lunch with friends, church, 1x month game night, weekly biking and visiting gym. At this time, is still able to participate in these listed social activities. Pt reporting difficulty following/processing conversations, especially those with more than 1 other person. Pt reports to stuttering, which upon questioning appears to  be 2/2 either language or cognitive deficits, presenting as difficulty organizing thoughts and/or word finding episodes. Tells SLP he will avoid conversations when family is visiting  unless 1 on 1. Communication overall within gross functional limits for today's conversation, will monitor over therapy course.   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 Speech is characterized by low vocal intensity, decreased amplitude of articulator movements, flat affect, monopitch and monotone. Pt endroses decreased volume impacting communication efficacy. Pt reports to "stuttering," not observed today.   ORAL MOTOR EXAMINATION: Overall status: Did not assess Comments: will assess 1st session when completing clinical swallow evaluation  OBJECTIVE VOICE ASSESSMENT: Sustained "ah" maximum phonation time: 7 seconds Sustained "ah" loudness average: 76 dB Conversational loudness average: 64 dB Conversational loudness range: 62-65 dB Voice quality: low vocal intensity and vocal fatigue Stimulability trials: Given SLP modeling and usual mod cues, loudness average increased to 71 dB in multi-word responses to personal questions    Completed audio recording of patients baseline voice without cueing from SLP: No  Pt does report difficulty with swallowing which does warrant further evaluation. Comments: Pt reporting food/liquid "goes down wrong pipe" about 1x per day. Spouse endorses increased coughing. Additional c/o impaired saliva management resulting in more drooling.  PATIENT REPORTED OUTCOME MEASURES (PROM): Deferred d/t time constraints  TODAY'S TREATMENT:                                                                                                         12-17-21: Education provided on evaluation results and SLP's recommendations. SLP educated on use of intent to maximize communication and swallow efficacy. Initiated training re: compensations and strategies to maximize communication efficacy across communication partners. Collaborated with pt and spouse to formulate plan of care and goals. Pt verbalizes agreement with POC, all questions answered to satisfaction.    PATIENT EDUCATION: Education details: see above Person educated: Patient and Spouse Education method: Customer service manager Education comprehension: verbalized understanding, returned demonstration, and needs further education  HOME EXERCISE PROGRAM: To be established    GOALS: Goals reviewed with patient? Yes  SHORT TERM GOALS: Target date: 01/14/2022  Pt will complete clinical swallow evaluation (CSE) to assess need for objective swallow evaluation  Baseline: Goal status: INITIAL  2.  Pt will complete instrumental swallow evaluation, if indicated per CSE to objectively assess swallow function and efficacy Baseline:  Goal status: INITIAL  3.  Pt will demonstrate communication strategies to aid in attention, processing, and coherent discourse in 15 minute conversation with occasional min-A Baseline:  Goal status: INITIAL  4.  Pt will meet dB targets for warm up and counting Speak Out exercises with rare min-A over 1 week period Baseline:  Goal status: INITIAL  5.  Pt's  care partner will teach back strategies and compensations to aid in pt's improved safety whilst eating meals at home over 1 week period Baseline:  Goal status: INITIAL   LONG TERM GOALS: Target date: 02/11/22  Pt will report daily HEP adherence for dysphagia and dysarthria with min-A from care partner over 1 week period Baseline:  Goal status: INITIAL  2.  Pt's average volume will exceed 68 dB in answering simple, 10 minute conversation, such as answering  personally relevant questions with occasional min-A over 2 sessions Baseline:  Goal status: INITIAL  3.  Pt will demonstrate saliva management techniques with compensations PRN and rare verbal cues over 30 minutes Baseline:  Goal status: INITIAL  4.  Pt's care partner will report carryover of strategies and compensations to aid in improved safety whilst eating meals at home and subjective report of reduced instances of coughing during meal times over 1 week period Baseline:  Goal status: INITIAL  5.  Pt's volume will average greater than 70 dB during Speak Out reading and cognitive exercises with occasional min-A over 2 sessions  Baseline:  Goal status: INITIAL   ASSESSMENT:  CLINICAL IMPRESSION: Patient is a 76 y.o. 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 Pt presents with mild dysarthria, cognitive impairment, and dysphagia. 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 Will plan to complete clinical swallow evaluation during first therapy session to assess swallow and potential need for objective instrumental study. Cognition impairments in areas of attention, memory, executive functioning resulting in needing A for finance, schedule, and medication management. Difficulties processing or speaking in conversations with more than 1 partner. Skilled ST is indicated.   OBJECTIVE IMPAIRMENTS: Objective impairments include attention, memory, executive functioning, expressive language, dysarthria, and dysphagia. These impairments are limiting patient from managing medications, managing appointments, managing finances, household responsibilities, ADLs/IADLs, effectively communicating at home and in community, and safety when swallowing.Factors affecting potential to achieve goals and functional outcome are  ability to learn/carryover information and medical prognosis.. Patient will benefit from skilled SLP services to address above impairments and improve overall function.  REHAB POTENTIAL: Fair (degenerative dx)  PLAN:  SLP FREQUENCY: 2x/week  SLP DURATION: 8 weeks  PLANNED INTERVENTIONS: Aspiration precaution training, Pharyngeal strengthening exercises, Diet toleration management , Language facilitation, Environmental controls, Trials of upgraded texture/liquids, Cueing hierachy, Cognitive reorganization, Internal/external aids, Functional tasks, SLP instruction and feedback, Compensatory strategies, Patient/family education, and Re-evaluation    Su Monks, CCC-SLP 12/17/2021, 10:19 AM

## 2021-12-17 ENCOUNTER — Ambulatory Visit: Payer: PPO | Admitting: Speech Pathology

## 2021-12-17 ENCOUNTER — Encounter: Payer: Self-pay | Admitting: Speech Pathology

## 2021-12-17 ENCOUNTER — Ambulatory Visit: Payer: PPO | Admitting: Physical Therapy

## 2021-12-17 DIAGNOSIS — R471 Dysarthria and anarthria: Secondary | ICD-10-CM

## 2021-12-17 DIAGNOSIS — R1310 Dysphagia, unspecified: Secondary | ICD-10-CM

## 2021-12-17 DIAGNOSIS — R2681 Unsteadiness on feet: Secondary | ICD-10-CM | POA: Diagnosis not present

## 2021-12-17 DIAGNOSIS — R293 Abnormal posture: Secondary | ICD-10-CM

## 2021-12-17 DIAGNOSIS — R2689 Other abnormalities of gait and mobility: Secondary | ICD-10-CM

## 2021-12-17 DIAGNOSIS — R41841 Cognitive communication deficit: Secondary | ICD-10-CM

## 2021-12-17 NOTE — Therapy (Signed)
OUTPATIENT PHYSICAL THERAPY NEURO TREATMENT   Patient Name: Tony [name] MRN: [id number] DOB:14-Dec-1945, 76 y.o., male Today's Date: 12/17/2021   PCP: Prince Solian, MD  REFERRING PROVIDER: Marcial Pacas, MD    PT End of Session - 12/17/21 0933     Visit Number 6    Number of Visits 17   Plus eval   Date for PT Re-Evaluation 01/30/22    Authorization Type [employer]    PT Start Time 0931   Handoff w/SLP   PT Stop Time 1013    PT Time Calculation (min) 42 min    Equipment Utilized During Treatment Gait belt    Activity Tolerance Patient tolerated treatment well    Behavior During Therapy WFL for tasks assessed/performed                  Past Medical History:  Diagnosis Date   Anxiety    Boil of buttock    High cholesterol    Imbalance    Low back pain    Past Surgical History:  Procedure Laterality Date   CYST REMOVAL LEG  2014   Patient Active Problem List   Diagnosis Date Noted   History of falling 11/29/2021   Unsteadiness on feet 11/29/2021   Excessive sleepiness 11/08/2021   Cognitive impairment 12/25/2020   Anxiety 04/25/2020   Gait abnormality 03/30/2020   Parkinson's disease 03/30/2020   Memory loss 03/30/2020   Parkinsonism 03/30/2020   CELLULITIS AND ABSCESS OF FACE 02/01/2008   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/2006   HYPERLIPIDEMIA 06/30/2006   DIVERTICULOSIS, COLON 06/30/2006   SEBORRHEIC KERATOSIS 06/30/2006   BACK PAIN, LUMBAR 06/30/2006    ONSET DATE: 11/08/2021   REFERRING DIAG: G20 (ICD-10-CM) - Parkinson's disease R41.89 (ICD-10-CM) - Cognitive impairment F41.9 (ICD-10-CM) - Anxiety R26.9 (ICD-10-CM) - Gait abnormality G47.10 (ICD-10-CM) - Excessive sleepiness   THERAPY DIAG:  Unsteadiness on feet  Abnormal posture  Other abnormalities of gait and mobility  Rationale for Evaluation and Treatment Rehabilitation  SUBJECTIVE:                                                                                                                                                                                               SUBJECTIVE STATEMENT: Pt presents very fatigued following busy weekend. Had one fall off the edge of his bed, was trying to put his pants on and slipped off the edge. Hit his face against the bed frame and has minor bruise on cheek but otherwise no injuries. Was able to get himself up on his own. HEP is going well,  went to the gym on Friday, Saturday and Sunday this past week in which he does the seated bike and free weights.    Pt accompanied by: self and wife in waiting room   PERTINENT HISTORY: DaTSCAN in July 2022 showed bilateral decreased radioactive tracing in the putamen and asymmetric decreased radiotracer activity in the head of the right caudate nucleus His cognitive impairment progress quickly compared to the idiopathic typical Parkinson's patient, he seems to have less optimal response to Sinemet, differentiation diagnosis including idiopathic Parkinson's disease with cognitive impairment versus Lewy body dementia  PAIN:  Are you having pain? No  PRECAUTIONS: Fall  WEIGHT BEARING RESTRICTIONS No  FALLS: Has patient fallen in last 6 months? Yes. Number of falls ~6 or more   PLOF: Independent  PATIENT GOALS "Work on my dexterity"   OBJECTIVE:   DIAGNOSTIC FINDINGS: DaTSCAN in July 2022 showed bilateral decreased radioactive tracing in the putamen and asymmetric decreased radiotracer activity in the head of the right caudate nucleus   COGNITION: Overall cognitive status: Impaired Noted saccadic intrusions of L eye during eval   POSTURE: rounded shoulders, forward head, and flexed trunk    TODAY'S TREATMENT:   Ther Ex  NuStep on gear 3 for 8 minutes using BUE/BLEs for neural priming for reciprocal movement, dynamic cardiovascular warmup and increased amplitude of stepping. Min cues for increased L knee extension ROM throughout.    NMR   On red floor mat for improved safety w/fall recovery, postural control and lateral weight shifting:  Practiced proper floor transfer x3 throughout session. Pt required min cues to remember how to get down onto floor but was able to perform floor > mat transfer well without cues. "This is how I do it at home". Instructed pt he should not attempt to stand from a fall if he has hit his head or thinks he is injured in any way. Pt verbalized understanding.  Alt tall to half kneels, x8 per side, for increased step clearance and lateral weight shifting. Pt able to step well w/LLE but had increased difficulty w/RLE due to maintained R truncal lean. CGA throughout.  Modified mini squats in tall kneel position, x15 reps without UE support, for postural control and anterior weight shift. Noted maintained truncal lean to R side despite verbal and tactile cues for midline orientation.  Pt requested to practice fall recovery if lying on L side, as pt states he frequently lands on his side and has difficulty getting into quadruped. Pt able to go from standing > tall kneel easily but could not sequence tall kneel to L sidelying without max tactile and verbal cues. Once in sidelying, noted pt is rotated to L side w/upper trunk but not pelvis, so he is unable to transition to quadruped. Provided mod multimodal cues to bring RLE over LLE to facilitate rotation at pelvis and pt able to perform remainder of transfer to standing w/CGA.  Pt able to teach back proper transfer to and from floor following session but could benefit from added practice if lying on R side instead of L side    GAIT: Gait pattern: step through pattern, decreased arm swing- Left, decreased step length- Left, decreased stride length, decreased hip/knee flexion- Right, decreased hip/knee flexion- Left, decreased ankle dorsiflexion- Right, decreased ankle dorsiflexion- Left, shuffling, decreased trunk rotation, trunk flexed, poor foot clearance- Right, and  poor foot clearance- Left Distance walked: Various clinic distances  Assistive device utilized:  hurrycane and None Level of assistance: CGA Comments: Noted maintained  IR and extension of LUE, L hand maintained in a fist. Min cues for open L hand and to facilitate arm swing. No instability noted w/turns and pt able to ambulate short distances in clinic without AD without LOB.   Escorted pt out of clinic w/CGA as pt insistent on not using cane following session. Handoff w/wife in lobby    PATIENT EDUCATION: Education details: Continue HEP, plan to review floor transfer next session  Person educated: Patient, Spouse, and Child(ren) Education method: Explanation and Demonstration Education comprehension: verbalized understanding and needs further education   HOME EXERCISE PROGRAM: Access Code: [email or web address] URL: [email or web address] Date: 12/03/2021 Prepared by: Mickie Bail Makalia Bare  Exercises - Sit to Stand Without Arm Support  - 1 x daily - 7 x weekly - 3 sets - 10 reps - Step sideways with arms reaching at counter   - 1 x daily - 7 x weekly - 3 sets - 10 reps - Alternating Step Backward with Support  - 1 x daily - 7 x weekly - 3 sets - 10 reps    GOALS: Goals reviewed with patient? Yes  SHORT TERM GOALS: Target date: 12/26/2021  Pt will perform initial HEP w/min A from family  for improved strength, balance, transfers and gait.  Baseline: not established on eval  Goal status: INITIAL  2.  Pt will improve MiniBest to 19/28 for decreased fall risk and improvement with compensatory stepping strategies.   Baseline: 17/28 Goal status: REVISED  3.  Pt will improve gait velocity to at least 2.9 ft/s with LRAD for improved gait efficiency and safety  Baseline: 2.5 ft/s w/hurrycane  Goal status: REVISED  4.  Pt will improve 5 x STS to less than or equal to 15 seconds without UE support to demonstrate improved functional strength and transfer efficiency.   Baseline: 18.5s  w/BUE support on reps 1-3 and heavy posterior bracing  Goal status: INITIAL  5.  Pt will improve normal TUG to less than or equal to 11 seconds w/LRAD for improved functional mobility and decreased fall risk.  Baseline: 13.72s without AD Goal status: REVISED  6.  Pt and family will be able to teach back and demonstrate fall prevention techniques, including freezing strategies, for reduced fall risk and improved safety at home  Baseline:  Goal status: INITIAL  LONG TERM GOALS: Target date: 01/23/2022  Pt will perform final HEP w/min A from family for improved strength, balance, transfers and gait.  Baseline:  Goal status: INITIAL  2.  Pt will improve gait velocity to at least 3.2 ft/s w/LRAD for improved gait efficiency and safety  Baseline: 2.5 ft/s w/hurrycane  Goal status: REVISED  3.  Pt will improve cog TUG to less than or equal to 15 seconds for improved functional mobility and decreased fall risk.  Baseline: 19.04s without AD  Goal status: REVISED  4.  Pt will improve MiniBest to 22/28 for decreased fall risk and improvement with compensatory stepping strategies.   Baseline: 17/28 Goal status: REVISED  5.  Pt and family will verbalize understanding of local PD community resources, including fitness post DC.   Baseline:  Goal status: INITIAL   ASSESSMENT:  CLINICAL IMPRESSION: Emphasis of skilled PT session on practicing safe floor transfers and lateral weight shifting to promote midline orientation. Pt had increased difficulty sequencing how to get down to floor and how to get on/off L side once in floor. Noted pt maintains R truncal lean despite multimodal cues for midline orientation, making it increasingly difficult to  off-weight RLE. Continue POC.   OBJECTIVE IMPAIRMENTS Abnormal gait, decreased balance, decreased cognition, decreased coordination, decreased knowledge of condition, decreased knowledge of use of DME, decreased mobility, difficulty walking,  decreased safety awareness, impaired perceived functional ability, impaired vision/preception, and improper body mechanics.   ACTIVITY LIMITATIONS carrying, lifting, bending, standing, squatting, stairs, transfers, dressing, reach over head, hygiene/grooming, locomotion level, and caring for others  PARTICIPATION LIMITATIONS: meal prep, cleaning, laundry, medication management, personal finances, interpersonal relationship, driving, shopping, community activity, occupation, and yard work  PERSONAL FACTORS Age, Education, Fitness, Past/current experiences, Time since onset of injury/illness/exacerbation, and 1 comorbidity: Lewy Body Dementia  are also affecting patient's functional outcome.   REHAB POTENTIAL: Fair due to poor prognosis of diagnosis   CLINICAL DECISION MAKING: Evolving/moderate complexity  EVALUATION COMPLEXITY: Moderate  PLAN: PT FREQUENCY: 2x/week  PT DURATION: 8 weeks  PLANNED INTERVENTIONS: Therapeutic exercises, Therapeutic activity, Neuromuscular re-education, Balance training, Gait training, Patient/Family education, Self Care, Joint mobilization, Stair training, Vestibular training, Canalith repositioning, DME instructions, Aquatic Therapy, Dry Needling, Electrical stimulation, Cryotherapy, Moist heat, Manual therapy, and Re-evaluation  PLAN FOR NEXT SESSION:  Add to HEP for deficits highlighted by outcome measures. Pt requesting to walk on treadmill and do boxing - very active. Slam balls, cone taps, lateral weight shifting to L side, LE coordination, depth perception, VOR? Floor transfers    [place], PT, DPT 12/17/2021, 10:13 AM

## 2021-12-19 ENCOUNTER — Ambulatory Visit: Payer: PPO | Admitting: Physical Therapy

## 2021-12-19 DIAGNOSIS — R2689 Other abnormalities of gait and mobility: Secondary | ICD-10-CM

## 2021-12-19 DIAGNOSIS — R2681 Unsteadiness on feet: Secondary | ICD-10-CM

## 2021-12-19 DIAGNOSIS — R293 Abnormal posture: Secondary | ICD-10-CM

## 2021-12-19 NOTE — Therapy (Signed)
OUTPATIENT PHYSICAL THERAPY NEURO TREATMENT   Patient Name: Tony [name] MRN: [id number] DOB:20-Mar-1945, 76 y.o., male Today's Date: 12/19/2021   PCP: Prince Solian, MD  REFERRING PROVIDER: Marcial Pacas, MD    PT End of Session - 12/19/21 1323     Visit Number 7    Number of Visits 17   Plus eval   Date for PT Re-Evaluation 01/30/22    Authorization Type [employer] P[employer] Part B    PT Start Time 9371   PT running late   PT Stop Time 1402    PT Time Calculation (min) 40 min    Equipment Utilized During Treatment Gait belt    Activity Tolerance Patient tolerated treatment well    Behavior During Therapy WFL for tasks assessed/performed                  Past Medical History:  Diagnosis Date   Anxiety    Boil of buttock    High cholesterol    Imbalance    Low back pain    Past Surgical History:  Procedure Laterality Date   CYST REMOVAL LEG  2014   Patient Active Problem List   Diagnosis Date Noted   History of falling 11/29/2021   Unsteadiness on feet 11/29/2021   Excessive sleepiness 11/08/2021   Cognitive impairment 12/25/2020   Anxiety 04/25/2020   Gait abnormality 03/30/2020   Parkinson's disease 03/30/2020   Memory loss 03/30/2020   Parkinsonism 03/30/2020   CELLULITIS AND ABSCESS OF FACE 02/01/2008   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/2006   HYPERLIPIDEMIA 06/30/2006   DIVERTICULOSIS, COLON 06/30/2006   SEBORRHEIC KERATOSIS 06/30/2006   BACK PAIN, LUMBAR 06/30/2006    ONSET DATE: 11/08/2021   REFERRING DIAG: G20 (ICD-10-CM) - Parkinson's disease R41.89 (ICD-10-CM) - Cognitive impairment F41.9 (ICD-10-CM) - Anxiety R26.9 (ICD-10-CM) - Gait abnormality G47.10 (ICD-10-CM) - Excessive sleepiness   THERAPY DIAG:  Unsteadiness on feet  Abnormal posture  Other abnormalities of gait and mobility  Rationale for Evaluation and Treatment Rehabilitation  SUBJECTIVE:                                                                                                                                                                                               SUBJECTIVE STATEMENT: Pt reports doing well, rode the bike and burned 170 calories yesterday, in which he is very proud. No falls.    Pt accompanied by: self and Daughter, Santiago Glad   PERTINENT HISTORY: DaTSCAN in July 2022 showed bilateral decreased radioactive tracing in the putamen and asymmetric decreased radiotracer activity in the head of the right  caudate nucleus His cognitive impairment progress quickly compared to the idiopathic typical Parkinson's patient, he seems to have less optimal response to Sinemet, differentiation diagnosis including idiopathic Parkinson's disease with cognitive impairment versus Lewy body dementia  PAIN:  Are you having pain? No  PRECAUTIONS: Fall  WEIGHT BEARING RESTRICTIONS No  FALLS: Has patient fallen in last 6 months? Yes. Number of falls ~6 or more   PLOF: Independent  PATIENT GOALS "Work on my dexterity"   OBJECTIVE:   DIAGNOSTIC FINDINGS: DaTSCAN in July 2022 showed bilateral decreased radioactive tracing in the putamen and asymmetric decreased radiotracer activity in the head of the right caudate nucleus   COGNITION: Overall cognitive status: Impaired Noted saccadic intrusions of L eye during eval   POSTURE: rounded shoulders, forward head, and flexed trunk    TODAY'S TREATMENT:   NMR  In // bars for improved anterior weight shifting, visual tracking, reaching out of BOS and reactive balance strategies:  On blue airex, ipsilateral cone reach and cross-body stack, x9 cones per side. Min cues to slow down to maintain balance as he reaches to each side. Pt w/increased difficulty reaching w/LUE and required min A to prevent knocking all cones over. No instability noted throughout.  On rockerboard in A/P direction, ball tosses w/pt's daughter using purple ball x15 minutes. Pt w/increased difficulty catching  ball w/BUEs and could not throw ball (attempting chest pass). Mod multimodal cues for underhand throw and pt able to perform. Noted pt had increased difficulty catching ball when thrown midline vs thrown laterally. Progressed to adding cog dual-task (naming animals) w/each throw and pt unable to remember animals and noted increased posterior LOB w/added dual task. Min-mod A throughout for retropulsion correction, which pt seemingly unaware of.   Ther Ex  SciFit multi-peaks level 6 for 8 minutes using BUE/BLEs for neural priming for reciprocal movement, dynamic cardiovascular conditioning and increased amplitude of stepping. Min cues for increased L knee extension ROM throughout. RPE of 9/10 following activity    GAIT: Gait pattern: step through pattern, decreased arm swing- Left, decreased step length- Left, decreased stride length, decreased hip/knee flexion- Right, decreased hip/knee flexion- Left, decreased ankle dorsiflexion- Right, decreased ankle dorsiflexion- Left, shuffling, decreased trunk rotation, trunk flexed, poor foot clearance- Right, and poor foot clearance- Left Distance walked: Various clinic distances  Assistive device utilized:  hurrycane and None Level of assistance: CGA Comments: Noted maintained IR and extension of LUE, L hand maintained in a fist but pt able to extend hand intermittently without cues. Min cues to facilitate arm swing. No instability noted w/turns and pt able to ambulate short distances in clinic without AD without LOB.    PATIENT EDUCATION: Education details: Continue HEP Person educated: Patient, Spouse, and Child(ren) Education method: Customer service manager Education comprehension: verbalized understanding and needs further education   HOME EXERCISE PROGRAM: Access Code: [email or web address] URL: [email or web address] Date: 12/03/2021 Prepared by: Mickie Bail Lucyle Alumbaugh  Exercises - Sit to Stand Without Arm Support  - 1 x daily - 7 x weekly - 3  sets - 10 reps - Step sideways with arms reaching at counter   - 1 x daily - 7 x weekly - 3 sets - 10 reps - Alternating Step Backward with Support  - 1 x daily - 7 x weekly - 3 sets - 10 reps    GOALS: Goals reviewed with patient? Yes  SHORT TERM GOALS: Target date: 12/26/2021  Pt will perform initial HEP w/min A from family  for improved strength, balance, transfers  and gait.  Baseline: not established on eval  Goal status: INITIAL  2.  Pt will improve MiniBest to 19/28 for decreased fall risk and improvement with compensatory stepping strategies.   Baseline: 17/28 Goal status: REVISED  3.  Pt will improve gait velocity to at least 2.9 ft/s with LRAD for improved gait efficiency and safety  Baseline: 2.5 ft/s w/hurrycane  Goal status: REVISED  4.  Pt will improve 5 x STS to less than or equal to 15 seconds without UE support to demonstrate improved functional strength and transfer efficiency.   Baseline: 18.5s w/BUE support on reps 1-3 and heavy posterior bracing  Goal status: INITIAL  5.  Pt will improve normal TUG to less than or equal to 11 seconds w/LRAD for improved functional mobility and decreased fall risk.  Baseline: 13.72s without AD Goal status: REVISED  6.  Pt and family will be able to teach back and demonstrate fall prevention techniques, including freezing strategies, for reduced fall risk and improved safety at home  Baseline:  Goal status: INITIAL  LONG TERM GOALS: Target date: 01/23/2022  Pt will perform final HEP w/min A from family for improved strength, balance, transfers and gait.  Baseline:  Goal status: INITIAL  2.  Pt will improve gait velocity to at least 3.2 ft/s w/LRAD for improved gait efficiency and safety  Baseline: 2.5 ft/s w/hurrycane  Goal status: REVISED  3.  Pt will improve cog TUG to less than or equal to 15 seconds for improved functional mobility and decreased fall risk.  Baseline: 19.04s without AD  Goal status:  REVISED  4.  Pt will improve MiniBest to 22/28 for decreased fall risk and improvement with compensatory stepping strategies.   Baseline: 17/28 Goal status: REVISED  5.  Pt and family will verbalize understanding of local PD community resources, including fitness post DC.   Baseline:  Goal status: INITIAL   ASSESSMENT:  CLINICAL IMPRESSION: Emphasis of skilled PT session on reactive balance strategies, reaching out of BOS and visual tracking. Pt able to maintain balance on unlevel surfaces w/reaching tasks if moving slowly. Pt with significant difficulty w/cog dual task and maintaining balance, as pt unaware of his retropulsion when given dual task. Continue POC.    OBJECTIVE IMPAIRMENTS Abnormal gait, decreased balance, decreased cognition, decreased coordination, decreased knowledge of condition, decreased knowledge of use of DME, decreased mobility, difficulty walking, decreased safety awareness, impaired perceived functional ability, impaired vision/preception, and improper body mechanics.   ACTIVITY LIMITATIONS carrying, lifting, bending, standing, squatting, stairs, transfers, dressing, reach over head, hygiene/grooming, locomotion level, and caring for others  PARTICIPATION LIMITATIONS: meal prep, cleaning, laundry, medication management, personal finances, interpersonal relationship, driving, shopping, community activity, occupation, and yard work  PERSONAL FACTORS Age, Education, Fitness, Past/current experiences, Time since onset of injury/illness/exacerbation, and 1 comorbidity: Lewy Body Dementia  are also affecting patient's functional outcome.   REHAB POTENTIAL: Fair due to poor prognosis of diagnosis   CLINICAL DECISION MAKING: Evolving/moderate complexity  EVALUATION COMPLEXITY: Moderate  PLAN: PT FREQUENCY: 2x/week  PT DURATION: 8 weeks  PLANNED INTERVENTIONS: Therapeutic exercises, Therapeutic activity, Neuromuscular re-education, Balance training, Gait  training, Patient/Family education, Self Care, Joint mobilization, Stair training, Vestibular training, Canalith repositioning, DME instructions, Aquatic Therapy, Dry Needling, Electrical stimulation, Cryotherapy, Moist heat, Manual therapy, and Re-evaluation  PLAN FOR NEXT SESSION:  Add to HEP for deficits highlighted by outcome measures. Pt requesting to walk on treadmill and do boxing - very active. Slam balls, cone taps, lateral weight shifting to L  side, LE coordination, depth perception, VOR? Floor transfers    [place], PT, DPT 12/19/2021, 2:15 PM

## 2021-12-24 ENCOUNTER — Encounter: Payer: Self-pay | Admitting: Physical Therapy

## 2021-12-24 ENCOUNTER — Ambulatory Visit: Payer: PPO | Admitting: Physical Therapy

## 2021-12-24 ENCOUNTER — Ambulatory Visit: Payer: PPO | Admitting: Speech Pathology

## 2021-12-24 DIAGNOSIS — R2681 Unsteadiness on feet: Secondary | ICD-10-CM

## 2021-12-24 DIAGNOSIS — R471 Dysarthria and anarthria: Secondary | ICD-10-CM

## 2021-12-24 DIAGNOSIS — R41841 Cognitive communication deficit: Secondary | ICD-10-CM

## 2021-12-24 DIAGNOSIS — R293 Abnormal posture: Secondary | ICD-10-CM

## 2021-12-24 DIAGNOSIS — R2689 Other abnormalities of gait and mobility: Secondary | ICD-10-CM

## 2021-12-24 DIAGNOSIS — R1310 Dysphagia, unspecified: Secondary | ICD-10-CM

## 2021-12-24 NOTE — Patient Instructions (Signed)
Saliva management:  Parkinson's affects automatic movements. Swallowing is an automatic movement and you are doing this less often than you were before. This is why you are experiencing drooling.   To lessen the occurrence of drooling, you need to practice intentionally swallowing  When you feel saliva near your lips, gather your spit and swallow  Before speaking, gather saliva and swallow  You need to REMIND yourself to swallow!!    SPEAK OUT! is a structured program targeting voice in patient's with Parkinson's. This program was developed by [employer]. It is evidence based and based on principles of motor learning. The nonprofit provides free materials to patients being treated by a Speak Out! certified SLP.   www.parkinsonvoiceproject.org for more information  www.parkinsonvoiceproject.org halfway down, SLP led lessons, go ahead and watch one and practice so you'll know what we're going to do in therapy  Learn about Parkinson's webinar: [employer]okerProtocol[employer]cz -- highly recommend watching this webinar!!  Order your stimulus booklet: [phone] Your provider: Myra Gianotti SLP, [facility]  This book is free!! They do offer a "pay if forward" option where you can donate to the non-profit organization so they can continue serving the Parkinson's population and providing these valuable resources to patients.

## 2021-12-24 NOTE — Therapy (Signed)
OUTPATIENT PHYSICAL THERAPY NEURO TREATMENT   Patient Name: Aubra Pappalardo MRN: [id number] DOB:July 30, 1945, 76 y.o., male Today's Date: 12/24/2021   PCP: Prince Solian, MD  REFERRING PROVIDER: Marcial Pacas, MD    PT End of Session - 12/24/21 0934     Visit Number 8    Number of Visits 17   Plus eval   Date for PT Re-Evaluation 01/30/22    Authorization Type [employer] P[employer] Part B    PT Start Time 0932    PT Stop Time 1013    PT Time Calculation (min) 41 min    Equipment Utilized During Treatment Gait belt    Activity Tolerance Patient tolerated treatment well    Behavior During Therapy WFL for tasks assessed/performed                  Past Medical History:  Diagnosis Date   Anxiety    Boil of buttock    High cholesterol    Imbalance    Low back pain    Past Surgical History:  Procedure Laterality Date   CYST REMOVAL LEG  2014   Patient Active Problem List   Diagnosis Date Noted   History of falling 11/29/2021   Unsteadiness on feet 11/29/2021   Excessive sleepiness 11/08/2021   Cognitive impairment 12/25/2020   Anxiety 04/25/2020   Gait abnormality 03/30/2020   Parkinson's disease 03/30/2020   Memory loss 03/30/2020   Parkinsonism 03/30/2020   CELLULITIS AND ABSCESS OF FACE 02/01/2008   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   THALASSEMIA [place] 09/12/2006   HYPERLIPIDEMIA 06/30/2006   DIVERTICULOSIS, COLON 06/30/2006   SEBORRHEIC KERATOSIS 06/30/2006   BACK PAIN, LUMBAR 06/30/2006    ONSET DATE: 11/08/2021   REFERRING DIAG: G20 (ICD-10-CM) - Parkinson's disease R41.89 (ICD-10-CM) - Cognitive impairment F41.9 (ICD-10-CM) - Anxiety R26.9 (ICD-10-CM) - Gait abnormality G47.10 (ICD-10-CM) - Excessive sleepiness   THERAPY DIAG:  Unsteadiness on feet  Abnormal posture  Other abnormalities of gait and mobility  Rationale for Evaluation and Treatment Rehabilitation  SUBJECTIVE:                                                                                                                                                                                               SUBJECTIVE STATEMENT: Goes the [employer], 2x a week for cycling classes. No falls. Reports balance is his biggest problem. Notices he has to go really slow and finds that he has to catch himself. Notices that if he moves too fast, then he will fall.    Pt accompanied by: self and    PERTINENT HISTORY: DaTSCAN in July 2022  showed bilateral decreased radioactive tracing in the putamen and asymmetric decreased radiotracer activity in the head of the right caudate nucleus His cognitive impairment progress quickly compared to the idiopathic typical Parkinson's patient, he seems to have less optimal response to Sinemet, differentiation diagnosis including idiopathic Parkinson's disease with cognitive impairment versus Lewy body dementia  PAIN:  Are you having pain? No  PRECAUTIONS: Fall  WEIGHT BEARING RESTRICTIONS No  FALLS: Has patient fallen in last 6 months? Yes. Number of falls ~6 or more   PLOF: Independent  PATIENT GOALS "Work on my dexterity"   OBJECTIVE:   DIAGNOSTIC FINDINGS: DaTSCAN in July 2022 showed bilateral decreased radioactive tracing in the putamen and asymmetric decreased radiotracer activity in the head of the right caudate nucleus   COGNITION: Overall cognitive status: Impaired Noted saccadic intrusions of L eye during eval   POSTURE: rounded shoulders, forward head, and flexed trunk    TODAY'S TREATMENT:  Goal Assessment: 5x sit <> stand: 15.1 seconds with no UE support and no posterior bracing against chair  Gait speed with no AD: 13.9 seconds = 2.35 ft/sec, 15.5 seconds with hurry cane = 2.12 ft/sec TUG: 15 seconds with hurry cane, 12.4 seconds with no AD    NMR  Pt performs PWR! Moves in Standing position at edge of mat table with chair in front just in case for balance    PWR! Up for improved posture x12 reps - cues for proper  mini squat position, opening up hands and tactile cues for scapular retraction. Pt took incr time to figure out how to perform retraction. Cues for trying to open up hands and slow down pace by holding for ~5 seconds   PWR! Rock for improved weighshifting x12 reps bilat - cued for wide BOS, and reaching and looking up at hands. Pt challenged by having to track his own hands when reaching up, had tendency to look at therapist's hand throughout with PT having to cue pt to look at his own hands.   PWR! Twist for improved trunk rotation 2 sets of 3 reps - cues to keep feet still  and to open arms big, esp with LUE. Pt very easily fatigued with this and could not do more than 3 reps on each side. Continued cues to track hands with eyes when twisting.   PWR! Step for improved step initiation x10 reps, with focus on big step, reaching out and opening up hands and look at hands. When pt would go to step and reach towards R, his LUE would also reach to the R.   Pt unaware of where his LUE/L hand is in space. Used mirror for visual feedback for pt to tell where LUE was and to work on proper positioning. Pt did better with visual cues with mirror.   Educated on purpose of each exercise. Pt stating that he can work on these at home. Discussed with pt, will hold off on that for now until pt can practice more during session and having a family member with him.     GAIT: Gait pattern: step through pattern, decreased arm swing- Left, decreased step length- Left, decreased stride length, decreased hip/knee flexion- Right, decreased hip/knee flexion- Left, decreased ankle dorsiflexion- Right, decreased ankle dorsiflexion- Left, shuffling, decreased trunk rotation, trunk flexed, poor foot clearance- Right, and poor foot clearance- Left Distance walked: Various clinic distances  Assistive device utilized:  hurrycane and None Level of assistance: SBA and CGA Comments: Noted maintained IR and extension of LUE, L  hand  maintained in a fist but pt able to extend hand intermittently without cues.    PATIENT EDUCATION: Education details: Results of STGs.  Person educated: Patient Education method: Explanation Education comprehension: verbalized understanding   HOME EXERCISE PROGRAM: Access Code: [email or web address] URL: [email or web address] Date: 12/03/2021 Prepared by: Mickie Bail Plaster  Exercises - Sit to Stand Without Arm Support  - 1 x daily - 7 x weekly - 3 sets - 10 reps - Step sideways with arms reaching at counter   - 1 x daily - 7 x weekly - 3 sets - 10 reps - Alternating Step Backward with Support  - 1 x daily - 7 x weekly - 3 sets - 10 reps    GOALS: Goals reviewed with patient? Yes  SHORT TERM GOALS: Target date: 12/26/2021  Pt will perform initial HEP w/min A from family  for improved strength, balance, transfers and gait.  Baseline: not established on eval  Goal status: INITIAL  2.  Pt will improve MiniBest to 19/28 for decreased fall risk and improvement with compensatory stepping strategies.   Baseline: 17/28 Goal status: REVISED  3.  Pt will improve gait velocity to at least 2.9 ft/s with LRAD for improved gait efficiency and safety  Baseline: 2.5 ft/s w/hurrycane;  13.9 seconds = 2.35 ft/sec, with no AD on 12/24/21 Goal status: NOT MET  4.  Pt will improve 5 x STS to less than or equal to 15 seconds without UE support to demonstrate improved functional strength and transfer efficiency.   Baseline: 18.5s w/BUE support on reps 1-3 and heavy posterior bracing; 15.1 seconds on 12/24/21 with no UE support on 12/24/21 Goal status: MET  5.  Pt will improve normal TUG to less than or equal to 11 seconds w/LRAD for improved functional mobility and decreased fall risk.  Baseline: 13.72s without AD; 12.4 without AD on 12/24/21 Goal status: NOT MET  6.  Pt and family will be able to teach back and demonstrate fall prevention techniques, including freezing strategies, for  reduced fall risk and improved safety at home  Baseline:  Goal status: INITIAL  LONG TERM GOALS: Target date: 01/23/2022  Pt will perform final HEP w/min A from family for improved strength, balance, transfers and gait.  Baseline:  Goal status: INITIAL  2.  Pt will improve gait velocity to at least 3.2 ft/s w/LRAD for improved gait efficiency and safety  Baseline: 2.5 ft/s w/hurrycane  Goal status: REVISED  3.  Pt will improve cog TUG to less than or equal to 15 seconds for improved functional mobility and decreased fall risk.  Baseline: 19.04s without AD  Goal status: REVISED  4.  Pt will improve MiniBest to 22/28 for decreased fall risk and improvement with compensatory stepping strategies.   Baseline: 17/28 Goal status: REVISED  5.  Pt and family will verbalize understanding of local PD community resources, including fitness post DC.   Baseline:  Goal status: INITIAL   ASSESSMENT:  CLINICAL IMPRESSION: Today's skilled session focused on beginning to assess pt's STGs. Pt met STG #4 in regards to 5x sit <> stand. Pt improved time to 15.1 seconds with no UE support and no episodes of BLE bracing against chair. Pt did not meet STGs #3 and #5 in regards to gait speed and TUG. Pt with improvement in TUG time without AD to 12.4 seconds, but not quite to goal level, but still decr pt's risk of falls. Remainder of session focused on trying standing PWR moves for balance, larger  amplitude movements, visual tracking. Pt challenged by having to track his hands throughout. Pt with decr awareness of where his LUE is in space, needing to mirror as visual cue for improved alignment and positioning. Pt reporting feeling very fatigued after performing standing PWR moves. Will continue per POC.   OBJECTIVE IMPAIRMENTS Abnormal gait, decreased balance, decreased cognition, decreased coordination, decreased knowledge of condition, decreased knowledge of use of DME, decreased mobility, difficulty  walking, decreased safety awareness, impaired perceived functional ability, impaired vision/preception, and improper body mechanics.   ACTIVITY LIMITATIONS carrying, lifting, bending, standing, squatting, stairs, transfers, dressing, reach over head, hygiene/grooming, locomotion level, and caring for others  PARTICIPATION LIMITATIONS: meal prep, cleaning, laundry, medication management, personal finances, interpersonal relationship, driving, shopping, community activity, occupation, and yard work  PERSONAL FACTORS Age, Education, Fitness, Past/current experiences, Time since onset of injury/illness/exacerbation, and 1 comorbidity: Lewy Body Dementia  are also affecting patient's functional outcome.   REHAB POTENTIAL: Fair due to poor prognosis of diagnosis   CLINICAL DECISION MAKING: Evolving/moderate complexity  EVALUATION COMPLEXITY: Moderate  PLAN: PT FREQUENCY: 2x/week  PT DURATION: 8 weeks  PLANNED INTERVENTIONS: Therapeutic exercises, Therapeutic activity, Neuromuscular re-education, Balance training, Gait training, Patient/Family education, Self Care, Joint mobilization, Stair training, Vestibular training, Canalith repositioning, DME instructions, Aquatic Therapy, Dry Needling, Electrical stimulation, Cryotherapy, Moist heat, Manual therapy, and Re-evaluation  PLAN FOR NEXT SESSION:  Check remainder of STGs. Add to HEP for deficits highlighted by outcome measures. Pt requesting to walk on treadmill and do boxing - very active. Slam balls, cone taps, lateral weight shifting to L side, LE coordination, depth perception, VOR? Floor transfers. Tried standing PWR moves, he was pretty challenged by these, not sure if appropriate for home?    Arliss Journey, PT, DPT 12/24/2021, 11:54 AM

## 2021-12-24 NOTE — Therapy (Signed)
OUTPATIENT SPEECH LANGUAGE PATHOLOGY TREATMENT NOTE   Patient Name: Tony [name] MRN: [id number] DOB:18-Nov-1945, 76 y.o., male Today's Date: 12/24/2021  PCP: Prince Solian, MD REFERRING PROVIDER: Marcial Pacas, MD    End of Session - 12/24/21 0853     Visit Number 2    Number of Visits 17    Date for SLP Re-Evaluation 02/11/22    Authorization Type [employer]    SLP Start Time 0851    SLP Stop Time  0930    SLP Time Calculation (min) 39 min    Activity Tolerance Patient tolerated treatment well              Past Medical History:  Diagnosis Date   Anxiety    Boil of buttock    High cholesterol    Imbalance    Low back pain    Past Surgical History:  Procedure Laterality Date   CYST REMOVAL LEG  2014   Patient Active Problem List   Diagnosis Date Noted   History of falling 11/29/2021   Unsteadiness on feet 11/29/2021   Excessive sleepiness 11/08/2021   Cognitive impairment 12/25/2020   Anxiety 04/25/2020   Gait abnormality 03/30/2020   Parkinson's disease 03/30/2020   Memory loss 03/30/2020   Parkinsonism 03/30/2020   CELLULITIS AND ABSCESS OF FACE 02/01/2008   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   THALASSEMIA [place] 09/12/2006   HYPERLIPIDEMIA 06/30/2006   DIVERTICULOSIS, COLON 06/30/2006   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 No   OBJECTIVE:   PATIENT REPORTED OUTCOME MEASURES (PROM): Provided to pt, he will return next therapy session  TODAY'S  TREATMENT:                                                                                                        12/24/21: SLP completed clinical swallow evaluation. Pt is currently implementing strategy of not mixing liquids and solids. Based on results, plan to initiate swallow and mealtime compensations and modifications. Will monitor and recommend instrumental swallow study if indicated based on ongoing progress: implementing of strategies/compensations. SLP provides education on PD and swallow and use of intentional swallow to aid in pharyngeal clearance. Pt verbalizes agreement. Generated saliva management strategies with pt able to demonstrate with rare-min-A. Teach back at conclusion of session with rare min-A. Provided pt with speak out order information, introduced program to pt.   CLINICAL SWALLOW ASSESSMENT:   Current diet: regular and thin liquids Dentition: adequate natural dentition Patient directly observed with POs: Yes: thin liquids, sequential sips Feeding: able to feed self Liquids provided by: cup Oral phase signs and symptoms: none Pharyngeal phase signs and symptoms: delayed throat  clear  ORAL MOTOR EXAMINATION: Overall status: Impaired:   Labial: Bilateral (ROM and Strength) Lingual: Bilateral (ROM, Strength, and Coordination) Mandible: ROM  Comments: overt difficulties with motor planning impacting lingual ROM and coordination  12-17-21: Education provided on evaluation results and SLP's recommendations. SLP educated on use of intent to maximize communication and swallow efficacy. Initiated training re: compensations and strategies to maximize communication efficacy across communication partners. Collaborated with pt and spouse to formulate plan of care and goals. Pt verbalizes agreement with POC, all questions answered to satisfaction.    PATIENT EDUCATION: Education details: see above Person educated: Patient and Spouse Education method: Holiday representative Education comprehension: verbalized understanding, returned demonstration, and needs further education  HOME EXERCISE PROGRAM: To be established    GOALS: Goals reviewed with patient? Yes  SHORT TERM GOALS: Target date: 01/14/2022  Pt will complete clinical swallow evaluation (CSE) to assess need for objective swallow evaluation  Baseline: 12/24/2021 Goal status: MET  2.  Pt will complete instrumental swallow evaluation, if indicated per CSE to objectively assess swallow function and efficacy Baseline:  Goal status: INITIAL  3.  Pt will demonstrate communication strategies to aid in attention, processing, and coherent discourse in 15 minute conversation with occasional min-A Baseline:  Goal status: INITIAL  4.  Pt will meet dB targets for warm up and counting Speak Out exercises with rare min-A over 1 week period Baseline:  Goal status: INITIAL  5.  Pt's care partner will teach back strategies and compensations to aid in pt's improved safety whilst eating meals at home over 1 week period Baseline:  Goal status: INITIAL   LONG TERM GOALS: Target date: 02/11/22  Pt will report daily HEP adherence for dysphagia and dysarthria with min-A from care partner over 1 week period Baseline:  Goal status: INITIAL  2.  Pt's average volume will exceed 68 dB in answering simple, 10 minute conversation, such as answering personally relevant questions with occasional min-A over 2 sessions Baseline:  Goal status: INITIAL  3.  Pt will demonstrate saliva management techniques with compensations PRN and rare verbal cues over 30 minutes Baseline:  Goal status: INITIAL  4.  Pt's care partner will report carryover of strategies and compensations to aid in improved safety whilst eating meals at home and subjective report of reduced instances of coughing during meal times over 1 week period Baseline:  Goal status: INITIAL  5.  Pt's volume will average greater than 70 dB during  Speak Out reading and cognitive exercises with occasional min-A over 2 sessions  Baseline:  Goal status: INITIAL   ASSESSMENT:  CLINICAL IMPRESSION: Patient is a 76 y.o. 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 Pt presents with mild dysarthria, cognitive impairment, and dysphagia. 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 Will plan to complete clinical swallow evaluation during first therapy session to assess swallow and potential need for objective instrumental study. Cognition impairments in areas of attention, memory, executive functioning resulting in needing A for finance, schedule, and medication management. Difficulties processing or speaking in conversations with more than 1 partner. Skilled ST is indicated.   OBJECTIVE IMPAIRMENTS: Objective impairments include attention, memory, executive functioning, expressive language, dysarthria, and dysphagia. These impairments are limiting patient from managing medications, managing appointments, managing finances, household responsibilities, ADLs/IADLs, effectively communicating at home and in community, and safety when swallowing.Factors affecting potential to  achieve goals and functional outcome are ability to learn/carryover information and medical prognosis.. Patient will benefit from skilled SLP services to address above impairments and improve overall function.  REHAB POTENTIAL: Fair (degenerative dx)  PLAN:  SLP FREQUENCY: 2x/week  SLP DURATION: 8 weeks  PLANNED INTERVENTIONS: Aspiration precaution training, Pharyngeal strengthening exercises, Diet toleration management , Language facilitation, Environmental controls, Trials of upgraded texture/liquids, Cueing hierachy, Cognitive reorganization, Internal/external aids, Functional tasks, SLP  instruction and feedback, Compensatory strategies, Patient/family education, and Re-evaluation    Su Monks, CCC-SLP 12/24/2021, 9:28 AM

## 2021-12-26 ENCOUNTER — Ambulatory Visit: Payer: PPO | Admitting: Speech Pathology

## 2021-12-26 ENCOUNTER — Encounter: Payer: Self-pay | Admitting: Speech Pathology

## 2021-12-26 ENCOUNTER — Ambulatory Visit: Payer: PPO | Attending: Neurology | Admitting: Physical Therapy

## 2021-12-26 DIAGNOSIS — R2681 Unsteadiness on feet: Secondary | ICD-10-CM | POA: Insufficient documentation

## 2021-12-26 DIAGNOSIS — R1310 Dysphagia, unspecified: Secondary | ICD-10-CM | POA: Insufficient documentation

## 2021-12-26 DIAGNOSIS — R293 Abnormal posture: Secondary | ICD-10-CM | POA: Diagnosis not present

## 2021-12-26 DIAGNOSIS — R471 Dysarthria and anarthria: Secondary | ICD-10-CM | POA: Insufficient documentation

## 2021-12-26 DIAGNOSIS — R278 Other lack of coordination: Secondary | ICD-10-CM | POA: Insufficient documentation

## 2021-12-26 DIAGNOSIS — R2689 Other abnormalities of gait and mobility: Secondary | ICD-10-CM | POA: Diagnosis not present

## 2021-12-26 DIAGNOSIS — R41841 Cognitive communication deficit: Secondary | ICD-10-CM | POA: Insufficient documentation

## 2021-12-26 DIAGNOSIS — R41840 Attention and concentration deficit: Secondary | ICD-10-CM | POA: Insufficient documentation

## 2021-12-26 DIAGNOSIS — M6281 Muscle weakness (generalized): Secondary | ICD-10-CM | POA: Diagnosis not present

## 2021-12-26 DIAGNOSIS — R29818 Other symptoms and signs involving the nervous system: Secondary | ICD-10-CM | POA: Diagnosis not present

## 2021-12-26 DIAGNOSIS — R41844 Frontal lobe and executive function deficit: Secondary | ICD-10-CM | POA: Insufficient documentation

## 2021-12-26 NOTE — Therapy (Signed)
OUTPATIENT SPEECH LANGUAGE PATHOLOGY TREATMENT NOTE   Patient Name: Tony [name] MRN: [id number] DOB:Sep 12, 1945, 76 y.o., male Today's Date: 12/26/2021  PCP: Prince Solian, MD REFERRING PROVIDER: Marcial Pacas, MD    End of Session - 12/26/21 0846     Visit Number 3    Number of Visits 17    Date for SLP Re-Evaluation 02/11/22    Authorization Type [employer]    SLP Start Time 0845    SLP Stop Time  0930    SLP Time Calculation (min) 45 min    Activity Tolerance Patient tolerated treatment well              Past Medical History:  Diagnosis Date   Anxiety    Boil of buttock    High cholesterol    Imbalance    Low back pain    Past Surgical History:  Procedure Laterality Date   CYST REMOVAL LEG  2014   Patient Active Problem List   Diagnosis Date Noted   History of falling 11/29/2021   Unsteadiness on feet 11/29/2021   Excessive sleepiness 11/08/2021   Cognitive impairment 12/25/2020   Anxiety 04/25/2020   Gait abnormality 03/30/2020   Parkinson's disease 03/30/2020   Memory loss 03/30/2020   Parkinsonism 03/30/2020   CELLULITIS AND ABSCESS OF FACE 02/01/2008   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   THALASSEMIA [place] 09/12/2006   HYPERLIPIDEMIA 06/30/2006   DIVERTICULOSIS, COLON 06/30/2006   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 No   OBJECTIVE:   PATIENT REPORTED OUTCOME MEASURES (PROM): Provided to pt, he will return next therapy session  TODAY'S  TREATMENT:          12/26/21: Reviewed general swallow precautions and strategies for drool management. He has noticed reduced drool since prior session by being mindful to swallow frequently and before speaking. Today he required verbal cues 2x to swallow when speaking due to saliva observed in corners of his mouth. They go out to eat every Wednesday evening with a large group. We generated a strategy of mentally preparing on the way to dinner to swallow with intent to aid carryover of swallow precautions in a distracting environment. Also generated strategy of his wife providing discrete hand signals to remind him to swallow or get louder when out with friends. Initiated HEP for dysarthria. He ordered Speak Out workbook yesterday.  Targeted volume and intelligibility using Speak Out! Lesson 1. Pt required frequent mod  verbal cues, modeling, for volume and breath support.   Pt averages the following volume levels:  Sustained AH: 85 dB  Counting:85  Reading (phrases): 85  Cognitive Exercise: 72 Required frequent max  verbal  & visual cues, modeling for carryover of intent /volume answering simple questions following structured practice.  12/24/21: SLP completed clinical swallow evaluation. Pt is currently implementing strategy of not mixing liquids and solids. Based on results, plan to initiate swallow and mealtime compensations and modifications. Will monitor and recommend instrumental swallow study if indicated based on ongoing progress: implementing of strategies/compensations. SLP provides education on PD and swallow and use of intentional swallow to aid in pharyngeal clearance. Pt verbalizes agreement. Generated saliva management strategies with pt able to demonstrate with rare-min-A. Teach back at conclusion of session with rare min-A. Provided pt with speak out order information, introduced program  to pt.   CLINICAL SWALLOW ASSESSMENT:   Current diet: regular and thin liquids Dentition: adequate natural dentition Patient directly observed with POs: Yes: thin liquids, sequential sips Feeding: able to feed self Liquids provided by: cup Oral phase signs and symptoms: none Pharyngeal phase signs and symptoms: delayed throat clear  ORAL MOTOR EXAMINATION: Overall status: Impaired:   Labial: Bilateral (ROM and Strength) Lingual: Bilateral (ROM, Strength, and Coordination) Mandible: ROM  Comments: overt difficulties with motor planning impacting lingual ROM and coordination  12-17-21: Education provided on evaluation results and SLP's recommendations. SLP educated on use of intent to maximize communication and swallow efficacy. Initiated training re: compensations and strategies to maximize communication efficacy across communication partners. Collaborated with pt and spouse to formulate plan of care and goals. Pt verbalizes agreement with POC, all questions answered to satisfaction.    PATIENT EDUCATION: Education details: see above Person educated: Patient and Spouse Education method: Customer service manager Education comprehension: verbalized understanding, returned demonstration, and needs further education  HOME EXERCISE PROGRAM: To be established    GOALS: Goals reviewed with patient? Yes  SHORT TERM GOALS: Target date: 01/14/2022  Pt will complete clinical swallow evaluation (CSE) to assess need for objective swallow evaluation  Baseline: 12/24/2021 Goal status: MET  2.  Pt will complete instrumental swallow evaluation, if indicated per CSE to objectively assess swallow function and efficacy Baseline:  Goal status: INITIAL  3.  Pt will demonstrate communication strategies to aid in attention, processing, and coherent discourse in 15 minute conversation with occasional min-A Baseline:  Goal status: INITIAL  4.  Pt will meet dB targets for warm up and  counting Speak Out exercises with rare min-A over 1 week period Baseline:  Goal status: INITIAL  5.  Pt's care partner will teach back strategies and compensations to aid in pt's improved safety whilst eating meals at home over 1 week period Baseline:  Goal status: INITIAL   LONG TERM GOALS: Target date: 02/11/22  Pt will report daily HEP adherence for dysphagia and dysarthria with min-A from care partner over 1 week period Baseline:  Goal status: INITIAL  2.  Pt's average volume will exceed 68 dB in answering simple, 10 minute conversation, such as answering personally relevant questions with occasional min-A over 2 sessions Baseline:  Goal status: INITIAL  3.  Pt will demonstrate saliva management techniques with compensations PRN and rare verbal cues over 30 minutes Baseline:  Goal status: INITIAL  4.  Pt's care partner will report carryover of strategies and compensations to aid in improved safety whilst eating meals at home and subjective report of reduced instances of coughing during meal times over 1 week period Baseline:  Goal status: INITIAL  5.  Pt's volume will average greater than 70 dB during Speak Out reading and cognitive exercises with occasional min-A over 2 sessions  Baseline:  Goal status: INITIAL   ASSESSMENT:  CLINICAL IMPRESSION: Patient is a 76 y.o. M  who was seen today for cognitive linguistic evaluation in presence of idiopathic Parkinson's disease with cognitive impairment versus Lewy body dementia. Pt presents with mild dysarthria, cognitive impairment, and dysphagia. 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 Will plan to complete clinical swallow evaluation during first therapy session to assess swallow and potential need for objective instrumental study. Cognition impairments in areas of attention, memory, executive functioning resulting in needing A for finance,  schedule, and medication management. Difficulties processing or speaking in conversations with more than 1 partner. Skilled ST is indicated.   OBJECTIVE IMPAIRMENTS: Objective impairments include attention, memory, executive functioning, expressive language, dysarthria, and dysphagia. These impairments are limiting patient from managing medications, managing appointments, managing finances, household responsibilities, ADLs/IADLs, effectively communicating at home and in community, and safety when swallowing.Factors affecting potential to achieve goals and functional outcome are ability to learn/carryover information and medical prognosis.. Patient will benefit from skilled SLP services to address above impairments and improve overall function.  REHAB POTENTIAL: Fair (degenerative dx)  PLAN:  SLP FREQUENCY: 2x/week  SLP DURATION: 8 weeks  PLANNED INTERVENTIONS: Aspiration precaution training, Pharyngeal strengthening exercises, Diet toleration management , Language facilitation, Environmental controls, Trials of upgraded texture/liquids, Cueing hierachy, Cognitive reorganization, Internal/external aids, Functional tasks, SLP instruction and feedback, Compensatory strategies, Patient/family education, and Re-evaluation    Koren Sermersheim, Annye Rusk, CCC-SLP 12/26/2021, 12:13 PM

## 2021-12-26 NOTE — Patient Instructions (Addendum)
   Consider a loose rubber band or Clayborn Bigness bracelet to help you remember to swallow  Use a discrete sign to help [name] remember to swallow or get his volume up  When you start talking at a more normal volume, you may feel like you are shouting - this is normal. We use the sound level meter to ensure you are at a normal dB level (70dB in conversation)   Take your time and focus on volume and breath rather than rushing through  Think effort of 8/10 to get the volume you need  Have your girls get a bigger font and keyboard on your Iphone     Get the persons attention before you speak  Use eye contact and face the person you are speaking to  Be in close proximity to the person you are speaking to  Turn down any noise in the environment such as the TV, walk away from loud appliances, air conditioners, fans, dish washers etc  In large gatherings, sit or stay on the side not the center of the room  Try to sit with a wall behind you or in a corner so noise isn't coming at you from all directions when dining out or attending gatherings  With PD, processing speed can slow - remember speech is very fast and topics change rapidly - [name], remind family to give him some time to respond rather than filling in for him. People don't like pauses in conversation, but [name] needs some extra time to get his thoughts together

## 2021-12-26 NOTE — Therapy (Signed)
OUTPATIENT PHYSICAL THERAPY NEURO TREATMENT   Patient Name: Tony [name] MRN: [id number] DOB:Jul 11, 1945, 76 y.o., male Today's Date: 12/26/2021   PCP: Prince Solian, MD  REFERRING PROVIDER: Marcial Pacas, MD    PT End of Session - 12/26/21 [phone]     Visit Number 9    Number of Visits 17   Plus eval   Date for PT Re-Evaluation 01/30/22    Authorization Type [employer] Part B    PT Start Time 0936   Handoff w/SLP   PT Stop Time 1019    PT Time Calculation (min) 43 min    Equipment Utilized During Treatment Gait belt    Activity Tolerance Patient tolerated treatment well    Behavior During Therapy WFL for tasks assessed/performed               Past Medical History:  Diagnosis Date   Anxiety    Boil of buttock    High cholesterol    Imbalance    Low back pain    Past Surgical History:  Procedure Laterality Date   CYST REMOVAL LEG  2014   Patient Active Problem List   Diagnosis Date Noted   History of falling 11/29/2021   Unsteadiness on feet 11/29/2021   Excessive sleepiness 11/08/2021   Cognitive impairment 12/25/2020   Anxiety 04/25/2020   Gait abnormality 03/30/2020   Parkinson's disease 03/30/2020   Memory loss 03/30/2020   Parkinsonism 03/30/2020   CELLULITIS AND ABSCESS OF FACE 02/01/2008   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/2006   HYPERLIPIDEMIA 06/30/2006   DIVERTICULOSIS, COLON 06/30/2006   SEBORRHEIC KERATOSIS 06/30/2006   BACK PAIN, LUMBAR 06/30/2006    ONSET DATE: 11/08/2021   REFERRING DIAG: G20 (ICD-10-CM) - Parkinson's disease R41.89 (ICD-10-CM) - Cognitive impairment F41.9 (ICD-10-CM) - Anxiety R26.9 (ICD-10-CM) - Gait abnormality G47.10 (ICD-10-CM) - Excessive sleepiness   THERAPY DIAG:  Unsteadiness on feet  Other abnormalities of gait and mobility  Abnormal posture  Rationale for Evaluation and Treatment Rehabilitation  SUBJECTIVE:                                                                                                                                                                                               SUBJECTIVE STATEMENT: Reports his LUE is sore following the PWR moves. No falls, "if I take it slow I do okay".    Pt accompanied by: self  PERTINENT HISTORY: DaTSCAN in July 2022 showed bilateral decreased radioactive tracing in the putamen and asymmetric decreased radiotracer activity in the head of the right caudate nucleus His cognitive impairment progress quickly compared to  the idiopathic typical Parkinson's patient, he seems to have less optimal response to Sinemet, differentiation diagnosis including idiopathic Parkinson's disease with cognitive impairment versus Lewy body dementia  PAIN:  Are you having pain? No Reports 3/10 soreness   PRECAUTIONS: Fall  WEIGHT BEARING RESTRICTIONS No  FALLS: Has patient fallen in last 6 months? Yes. Number of falls ~6 or more   PLOF: Independent  PATIENT GOALS "Work on my dexterity"   OBJECTIVE:   DIAGNOSTIC FINDINGS: DaTSCAN in July 2022 showed bilateral decreased radioactive tracing in the putamen and asymmetric decreased radiotracer activity in the head of the right caudate nucleus   COGNITION: Overall cognitive status: Impaired Noted saccadic intrusions of L eye during eval   POSTURE: rounded shoulders, forward head, and flexed trunk    TODAY'S TREATMENT:  Ther Act  STG Assessment  Reviewed fall prevention techniques w/pt, as he can not fully recall on his own. "I have bad memory". Family did not come to session. Reviewed importance of using AD at all times, having well-lit areas at night and reducing trip hazards (throw rugs)    [facility] PT Assessment - 12/26/21 0946       Mini-BESTest   Sit To Stand Normal: Comes to stand without use of hands and stabilizes independently.    Rise to Toes Moderate: Heels up, but not full range (smaller than when holding hands), OR noticeable instability for 3 s.     Stand on one leg (left) Moderate: < 20 s   <1s   Stand on one leg (right) Moderate: < 20 s   3s   Stand on one leg - lowest score 1    Compensatory Stepping Correction - Forward Moderate: More than one step is required to recover equilibrium   3 small steps   Compensatory Stepping Correction - Backward Moderate: More than one step is required to recover equilibrium   Multiple small steps   Compensatory Stepping Correction - Left Lateral Severe: Falls, or cannot step   Unable to lean to L   Compensatory Stepping Correction - Right Lateral Moderate: Several steps to recover equilibrium   Multiple small steps   Stepping Corredtion Lateral - lowest score 0    Stance - Feet together, eyes open, firm surface  Normal: 30s    Stance - Feet together, eyes closed, foam surface  Normal: 30s    Incline - Eyes Closed Normal: Stands independently 30s and aligns with gravity    Change in Gait Speed Moderate: Unable to change walking speed or signs of imbalance    Walk with head turns - Horizontal Moderate: performs head turns with reduction in gait speed.    Walk with pivot turns Moderate:Turns with feet close SLOW (>4 steps) with good balance.    Step over obstacles Moderate: Steps over box but touches box OR displays cautious behavior by slowing gait.    Timed UP & GO with Dual Task Moderate: Dual Task affects either counting OR walking (>10%) when compared to the TUG without Dual Task.    Mini-BEST total score 17      Timed Up and Go Test   Normal TUG (seconds) 12.66   no AD   Cognitive TUG (seconds) 17.18   no AD, retro counting by 1s   TUG Comments >10% difference indicates high fall risk             Ther Ex  SciFit multi-peaks level 8 for 4 minutes and level 4 for 4 minutes (due to fatigue) using  BUE/BLEs for neural priming for reciprocal movement, dynamic cardiovascular conditioning and increased amplitude of stepping. RPE of 9/10 following activity    GAIT: Gait pattern: step through  pattern, decreased arm swing- Left, decreased step length- Left, decreased stride length, decreased hip/knee flexion- Right, decreased hip/knee flexion- Left, decreased ankle dorsiflexion- Right, decreased ankle dorsiflexion- Left, shuffling, decreased trunk rotation, trunk flexed, poor foot clearance- Right, and poor foot clearance- Left Distance walked: Various clinic distances  Assistive device utilized:  hurrycane and None Level of assistance: SBA and CGA Comments: Noted maintained IR and extension of LUE, L hand maintained in a fist but pt able to extend hand intermittently without cues.    PATIENT EDUCATION: Education details: Results of STGs, continue HEP Person educated: Patient Education method: Explanation Education comprehension: verbalized understanding   HOME EXERCISE PROGRAM: Access Code: [email or web address] URL: [email or web address] Date: 12/03/2021 Prepared by: Mickie Bail Chia Rock  Exercises - Sit to Stand Without Arm Support  - 1 x daily - 7 x weekly - 3 sets - 10 reps - Step sideways with arms reaching at counter   - 1 x daily - 7 x weekly - 3 sets - 10 reps - Alternating Step Backward with Support  - 1 x daily - 7 x weekly - 3 sets - 10 reps    GOALS: Goals reviewed with patient? Yes  SHORT TERM GOALS: Target date: 12/26/2021  Pt will perform initial HEP w/min A from family  for improved strength, balance, transfers and gait.  Baseline: not established on eval  Goal status: MET  2.  Pt will improve MiniBest to 19/28 for decreased fall risk and improvement with compensatory stepping strategies.   Baseline: 17/28; 17/28 on 11/1 Goal status: NOT MET  3.  Pt will improve gait velocity to at least 2.9 ft/s with LRAD for improved gait efficiency and safety  Baseline: 2.5 ft/s w/hurrycane;  13.9 seconds = 2.35 ft/sec, with no AD on 12/24/21 Goal status: NOT MET  4.  Pt will improve 5 x STS to less than or equal to 15 seconds without UE support to demonstrate  improved functional strength and transfer efficiency.   Baseline: 18.5s w/BUE support on reps 1-3 and heavy posterior bracing; 15.1 seconds on 12/24/21 with no UE support on 12/24/21 Goal status: MET  5.  Pt will improve normal TUG to less than or equal to 11 seconds w/LRAD for improved functional mobility and decreased fall risk.  Baseline: 13.72s without AD; 12.4 without AD on 12/24/21 Goal status: NOT MET  6.  Pt and family will be able to teach back and demonstrate fall prevention techniques, including freezing strategies, for reduced fall risk and improved safety at home  Baseline: Pt able to verbalize w/min cues for recall, family not present for session  Goal status: IN PROGRESS  LONG TERM GOALS: Target date: 01/23/2022  Pt will perform final HEP w/min A from family for improved strength, balance, transfers and gait.  Baseline:  Goal status: INITIAL  2.  Pt will improve gait velocity to at least 3.2 ft/s w/LRAD for improved gait efficiency and safety  Baseline: 2.5 ft/s w/hurrycane  Goal status: REVISED  3.  Pt will improve cog TUG to less than or equal to 15 seconds for improved functional mobility and decreased fall risk.  Baseline: 19.04s without AD  Goal status: REVISED  4.  Pt will improve MiniBest to 19/28 for decreased fall risk and improvement with compensatory stepping strategies.   Baseline: 17/28 on 11/1 Goal status: REVISED  5.  Pt and family will verbalize understanding of local PD community resources, including fitness post DC.   Baseline:  Goal status: INITIAL   ASSESSMENT:  CLINICAL IMPRESSION: Emphasis of skilled PT session on completing STG assessment. Pt met goal #1, performing his HEP regularly as well as being active at the gym w/cycling and weight training. Pt did not meet goal #2, achieving the same score on MiniBest that he scored on eval. Noted continued difficulty w/facilitating stepping strategies, especially in retro direction and to L  side. Pt has partially met goal #6, but requires cues to remember fall prevention techniques and did not have family present during session. Remainder of goals checked in previous session. Pt continues to be limited by poor attention/depth perception on L side and impaired working memory, making him a high fall risk. Continue POC.   OBJECTIVE IMPAIRMENTS Abnormal gait, decreased balance, decreased cognition, decreased coordination, decreased knowledge of condition, decreased knowledge of use of DME, decreased mobility, difficulty walking, decreased safety awareness, impaired perceived functional ability, impaired vision/preception, and improper body mechanics.   ACTIVITY LIMITATIONS carrying, lifting, bending, standing, squatting, stairs, transfers, dressing, reach over head, hygiene/grooming, locomotion level, and caring for others  PARTICIPATION LIMITATIONS: meal prep, cleaning, laundry, medication management, personal finances, interpersonal relationship, driving, shopping, community activity, occupation, and yard work  PERSONAL FACTORS Age, Education, Fitness, Past/current experiences, Time since onset of injury/illness/exacerbation, and 1 comorbidity: Lewy Body Dementia  are also affecting patient's functional outcome.   REHAB POTENTIAL: Fair due to poor prognosis of diagnosis   CLINICAL DECISION MAKING: Evolving/moderate complexity  EVALUATION COMPLEXITY: Moderate  PLAN: PT FREQUENCY: 2x/week  PT DURATION: 8 weeks  PLANNED INTERVENTIONS: Therapeutic exercises, Therapeutic activity, Neuromuscular re-education, Balance training, Gait training, Patient/Family education, Self Care, Joint mobilization, Stair training, Vestibular training, Canalith repositioning, DME instructions, Aquatic Therapy, Dry Needling, Electrical stimulation, Cryotherapy, Moist heat, Manual therapy, and Re-evaluation  PLAN FOR NEXT SESSION:  Stepping strategies. Add to HEP for deficits highlighted by outcome measures.  Pt requesting to walk on treadmill and do boxing - very active. Slam balls, cone taps, lateral weight shifting to L side, LE coordination, depth perception, VOR? Floor transfers. Tried standing PWR moves, he was pretty challenged by these, not sure if appropriate for home?    Cruzita Lederer Kendrik Mcshan, PT, DPT 12/26/2021, 10:20 AM

## 2021-12-28 ENCOUNTER — Emergency Department (HOSPITAL_BASED_OUTPATIENT_CLINIC_OR_DEPARTMENT_OTHER)
Admission: EM | Admit: 2021-12-28 | Discharge: 2021-12-28 | Disposition: A | Discharge status: Home / Self Care | Payer: PPO | Attending: Emergency Medicine | Admitting: Emergency Medicine

## 2021-12-28 ENCOUNTER — Encounter (HOSPITAL_BASED_OUTPATIENT_CLINIC_OR_DEPARTMENT_OTHER): Payer: Self-pay | Admitting: Urology

## 2021-12-28 ENCOUNTER — Emergency Department (HOSPITAL_BASED_OUTPATIENT_CLINIC_OR_DEPARTMENT_OTHER): Payer: PPO

## 2021-12-28 DIAGNOSIS — M47812 Spondylosis without myelopathy or radiculopathy, cervical region: Secondary | ICD-10-CM | POA: Diagnosis not present

## 2021-12-28 DIAGNOSIS — S0083XA Contusion of other part of head, initial encounter: Secondary | ICD-10-CM | POA: Diagnosis not present

## 2021-12-28 DIAGNOSIS — S0081XA Abrasion of other part of head, initial encounter: Secondary | ICD-10-CM | POA: Insufficient documentation

## 2021-12-28 DIAGNOSIS — S0990XA Unspecified injury of head, initial encounter: Secondary | ICD-10-CM | POA: Diagnosis present

## 2021-12-28 DIAGNOSIS — G4489 Other headache syndrome: Secondary | ICD-10-CM | POA: Diagnosis not present

## 2021-12-28 DIAGNOSIS — G20C Parkinsonism, unspecified: Secondary | ICD-10-CM | POA: Diagnosis not present

## 2021-12-28 DIAGNOSIS — S6992XA Unspecified injury of left wrist, hand and finger(s), initial encounter: Secondary | ICD-10-CM | POA: Diagnosis not present

## 2021-12-28 DIAGNOSIS — W19XXXA Unspecified fall, initial encounter: Secondary | ICD-10-CM | POA: Diagnosis not present

## 2021-12-28 DIAGNOSIS — S60222A Contusion of left hand, initial encounter: Secondary | ICD-10-CM | POA: Insufficient documentation

## 2021-12-28 DIAGNOSIS — W010XXA Fall on same level from slipping, tripping and stumbling without subsequent striking against object, initial encounter: Secondary | ICD-10-CM | POA: Diagnosis not present

## 2021-12-28 DIAGNOSIS — J329 Chronic sinusitis, unspecified: Secondary | ICD-10-CM | POA: Diagnosis not present

## 2021-12-28 DIAGNOSIS — G319 Degenerative disease of nervous system, unspecified: Secondary | ICD-10-CM | POA: Diagnosis not present

## 2021-12-28 DIAGNOSIS — M19042 Primary osteoarthritis, left hand: Secondary | ICD-10-CM | POA: Diagnosis not present

## 2021-12-28 HISTORY — DX: Parkinson's disease without dyskinesia, without mention of fluctuations: G20.A1

## 2021-12-28 NOTE — ED Provider Notes (Signed)
[facility] DEPART[facility] Provider Note   CSN: [id number] Arrival date & time: 12/28/21  1510     History {Add pertinent medical, surgical, social history, OB history to HPI:1} Chief Complaint  Patient presents with   Tony [name] is a 76 y.o. male.  He has a history of Parkinson disease and has unsteady gait.  He was out getting the mail when he lost his balance and fell onto the grass.  Denies loss of consciousness.  He has some abrasions to his left hand and his face and he has some pain in his neck.  No numbness or weakness.  He is not on any blood thinners.  Denies any chest pain or shortness of breath.  He has been able to ambulate since the fall.  He does not think there is any fractures.  The history is provided by the patient and the spouse.  Fall This is a recurrent problem. The current episode started 1 to 2 hours ago. The problem has not changed since onset.Associated symptoms include headaches. Pertinent negatives include no chest pain, no abdominal pain and no shortness of breath. Nothing aggravates the symptoms. Nothing relieves the symptoms. He has tried nothing for the symptoms. The treatment provided no relief.       Home Medications Prior to Admission medications   Medication Sig Start Date End Date Taking? Authorizing Provider  atorvastatin (LIPITOR) 10 MG tablet Take 10 mg by mouth daily.    [provider]  carbidopa-levodopa (SINEMET IR) 25-100 MG tablet Take 2 tablets by mouth 3 (three) times daily. Patient taking differently: Take 1.5 tablets by mouth 3 (three) times daily. 11/08/21   Marcial Pacas, MD  citalopram (CELEXA) 10 MG tablet Take 2 tablets (20 mg total) by mouth daily. 07/04/21   Suzzanne Cloud, NP  donepezil (ARICEPT) 10 MG tablet Take 1 tablet (10 mg total) by mouth in the morning. 07/04/21   Suzzanne Cloud, NP  fexofenadine (ALLEGRA) 60 MG tablet Take 60 mg by mouth 2 (two) times daily.    [provider]   Multiple Vitamin (MULTI-VITAMIN DAILY PO) Take 1 tablet by mouth daily.    [provider]  traZODone (DESYREL) 50 MG tablet  09/24/21   [provider]      Allergies    Shellfish allergy    Review of Systems   Review of Systems  Constitutional:  Negative for fever.  Eyes:  Negative for visual disturbance.  Respiratory:  Negative for shortness of breath.   Cardiovascular:  Negative for chest pain.  Gastrointestinal:  Negative for abdominal pain.  Musculoskeletal:  Positive for neck pain.  Skin:  Positive for wound.  Neurological:  Positive for headaches.    Physical Exam Updated Vital Signs BP 118/66 (BP Location: Right Arm)   Pulse 84   Temp 98.6 F (37 C) (Oral)   Resp 17   Ht '5\' 11"'$  (1.803 m)   Wt 90.7 kg   SpO2 100%   BMI 27.89 kg/m  Physical Exam Vitals and nursing note reviewed.  Constitutional:      General: He is not in acute distress.    Appearance: Normal appearance. He is well-developed.  HENT:     Head: Normocephalic.     Comments: He has abrasions over the left side of his forehead and cheek and nose.  No active bleeding.  No nosebleed or septal hematoma.  No crepitus. Eyes:     Conjunctiva/sclera: Conjunctivae normal.  Neck:     Comments: Is some tenderness on the left side of his neck.  There is no midline step-offs. Cardiovascular:     Rate and Rhythm: Normal rate and regular rhythm.     Heart sounds: No murmur heard. Pulmonary:     Effort: Pulmonary effort is normal. No respiratory distress.     Breath sounds: Normal breath sounds.  Abdominal:     Palpations: Abdomen is soft.     Tenderness: There is no abdominal tenderness. There is no guarding or rebound.  Musculoskeletal:        General: Tenderness present. No deformity. Normal range of motion.     Cervical back: Neck supple. Tenderness present.     Comments: There are some tenderness over the dorsum of his left hand and some abrasions  Skin:    General: Skin is warm and  dry.     Capillary Refill: Capillary refill takes less than 2 seconds.  Neurological:     General: No focal deficit present.     Mental Status: He is alert.     Sensory: No sensory deficit.     Motor: No weakness.     ED Results / Procedures / Treatments   Labs (all labs ordered are listed, but only abnormal results are displayed) Labs Reviewed - No data to display  EKG None  Radiology No results found.  Procedures Procedures  {Document cardiac monitor, telemetry assessment procedure when appropriate:1}  Medications Ordered in ED Medications - No data to display  ED Course/ Medical Decision Making/ A&P                           Medical Decision Making Amount and/or Complexity of Data Reviewed Radiology: ordered.   This patient complains of ***; this involves an extensive number of treatment Options and is a complaint that carries with it a high risk of complications and morbidity. The differential includes ***  I ordered, reviewed and interpreted labs, which included *** I ordered medication *** and reviewed PMP when indicated. I ordered imaging studies which included *** and I independently    visualized and interpreted imaging which showed *** Additional history obtained from *** Previous records obtained and reviewed *** I consulted *** and discussed lab and imaging findings and discussed disposition.  Cardiac monitoring reviewed, *** Social determinants considered, *** Critical Interventions: ***  After the interventions stated above, I reevaluated the patient and found *** Admission and further testing considered, ***   {Document critical care time when appropriate:1} {Document review of labs and clinical decision tools ie heart score, Chads2Vasc2 etc:1}  {Document your independent review of radiology images, and any outside records:1} {Document your discussion with family members, caretakers, and with consultants:1} {Document social determinants of  health affecting pt's care:1} {Document your decision making why or why not admission, treatments were needed:1} Final Clinical Impression(s) / ED Diagnoses Final diagnoses:  None    Rx / DC Orders ED Discharge Orders     None

## 2021-12-28 NOTE — Discharge Instructions (Signed)
You were seen in the emergency department for evaluation of injuries from a fall.  You had a CAT scan of your head face and neck that did not show any fractures.  X-ray of your left hand also did not show any fracture.  Please use soap and water to your wounds and can apply as needed topical antibiotic such as bacitracin.  Follow-up with your regular doctor.  Return to the emergency department if any worsening or concerning symptoms.

## 2021-12-28 NOTE — ED Triage Notes (Signed)
Per EMS Witness fall by wife, mechanical fall into the grass  Facial abrasions, skin tear to left hand  NO LOC, not on thinners C/o headache, took '1000mg'$  tylenol PTA  Family on way as well  H/o parkinsons with loss of balance

## 2021-12-31 ENCOUNTER — Encounter: Payer: Self-pay | Admitting: Physical Therapy

## 2021-12-31 ENCOUNTER — Ambulatory Visit: Payer: PPO | Admitting: Physical Therapy

## 2021-12-31 DIAGNOSIS — R293 Abnormal posture: Secondary | ICD-10-CM

## 2021-12-31 DIAGNOSIS — R2681 Unsteadiness on feet: Secondary | ICD-10-CM

## 2021-12-31 DIAGNOSIS — R2689 Other abnormalities of gait and mobility: Secondary | ICD-10-CM

## 2021-12-31 NOTE — Therapy (Signed)
OUTPATIENT PHYSICAL THERAPY NEURO TREATMENT   Patient Name: Tony [name] MRN: [id number] DOB:11-10-1945, 76 y.o., male Today's Date: 12/31/2021   PCP: Prince Solian, MD  REFERRING PROVIDER: Marcial Pacas, MD    PT End of Session - 12/31/21 1018     Visit Number 10    Number of Visits 17   Plus eval   Date for PT Re-Evaluation 01/30/22    Authorization Type [employer] Part B    PT Start Time 1017    PT Stop Time 1058    PT Time Calculation (min) 41 min    Equipment Utilized During Treatment Gait belt    Activity Tolerance Patient tolerated treatment well    Behavior During Therapy WFL for tasks assessed/performed               Past Medical History:  Diagnosis Date   Anxiety    Boil of buttock    High cholesterol    Imbalance    Low back pain    Parkinsons    Past Surgical History:  Procedure Laterality Date   CYST REMOVAL LEG  2014   Patient Active Problem List   Diagnosis Date Noted   History of falling 11/29/2021   Unsteadiness on feet 11/29/2021   Excessive sleepiness 11/08/2021   Cognitive impairment 12/25/2020   Anxiety 04/25/2020   Gait abnormality 03/30/2020   Parkinson's disease 03/30/2020   Memory loss 03/30/2020   Parkinsonism 03/30/2020   CELLULITIS AND ABSCESS OF FACE 02/01/2008   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/2006   HYPERLIPIDEMIA 06/30/2006   DIVERTICULOSIS, COLON 06/30/2006   SEBORRHEIC KERATOSIS 06/30/2006   BACK PAIN, LUMBAR 06/30/2006    ONSET DATE: 11/08/2021   REFERRING DIAG: G20 (ICD-10-CM) - Parkinson's disease R41.89 (ICD-10-CM) - Cognitive impairment F41.9 (ICD-10-CM) - Anxiety R26.9 (ICD-10-CM) - Gait abnormality G47.10 (ICD-10-CM) - Excessive sleepiness   THERAPY DIAG:  Unsteadiness on feet  Abnormal posture  Other abnormalities of gait and mobility  Rationale for Evaluation and Treatment Rehabilitation  SUBJECTIVE:                                                                                                                                                                                               SUBJECTIVE STATEMENT: Had a fall a few days ago.  Was using his cane and went to get the mail, then was walking down a gradual incline trying to hold/balance mail and use his cane and then went down the incline too fast. Fell into the grass. A couple of his neighbors had to help get him up. Went to the ER  and nothing was broken and imaging showed no acute traumatic findings. Has a contusion on his face and L hand. Didn't get to go to cycling on Thursday.    Pt accompanied by: Kristian Covey   PERTINENT HISTORY: DaTSCAN in July 2022 showed bilateral decreased radioactive tracing in the putamen and asymmetric decreased radiotracer activity in the head of the right caudate nucleus His cognitive impairment progress quickly compared to the idiopathic typical Parkinson's patient, he seems to have less optimal response to Sinemet, differentiation diagnosis including idiopathic Parkinson's disease with cognitive impairment versus Lewy body dementia  PAIN:  Are you having pain? No  PRECAUTIONS: Fall  WEIGHT BEARING RESTRICTIONS No  FALLS: Has patient fallen in last 6 months? Yes. Number of falls ~6 or more   PLOF: Independent  PATIENT GOALS "Work on my dexterity"    OBJECTIVE:   DIAGNOSTIC FINDINGS: DaTSCAN in July 2022 showed bilateral decreased radioactive tracing in the putamen and asymmetric decreased radiotracer activity in the head of the right caudate nucleus   COGNITION: Overall cognitive status: Impaired Noted saccadic intrusions of L eye during eval   POSTURE: rounded shoulders, forward head, and flexed trunk    TODAY'S TREATMENT:  Ther Act  Reviewed fall prevention and provided handout (now that pt's sister is present today as pt had not had family present in previous sessions). Reviewed importance of using AD at all times, having well-lit areas at  night, reducing trip hazards (throw rugs), having grab bars (pt has grab bars and is also getting a walk in shower) etc.     NMR: Alternating forward stepping strategy to and back to colorful floor dot target 15 reps each side, pt needing demo/visual cues for where to step foot to and step back to midline (pt initially stepping too far backwards without use of visual cue), beginning with UE support > none.   Alternating forward into immediate backwards step 15 reps each side, pt more challenged with SLS tasks on LLE, UE support > none, cues for posture as pt tends to be more forward flexed. Pt needing one episode of min A for balance when having to balance on LLE.  Alternating lateral stepping strategy over 2" obstacle, without UE support x12 reps each side, pt more challenged with lifting LLE up over obstacle.  On air ex next to countertop with wide BOS:  -Lateral weight shifting with reaching towards target superior/laterally x12 reps each side, and then trunk rotation with reaching to target across body x12 reps each side, pt more challenged with this esp with weight shifting to L and had tendency to lose balance forwards into the countertop  -Lateral weight shifting with lifting contralateral leg for dynamic SLS x12 reps each side, min guard for balance    GAIT: Gait pattern: step through pattern, decreased arm swing- Left, decreased step length- Left, decreased stride length, decreased hip/knee flexion- Right, decreased hip/knee flexion- Left, decreased ankle dorsiflexion- Right, decreased ankle dorsiflexion- Left, shuffling, decreased trunk rotation, trunk flexed, poor foot clearance- Right, and poor foot clearance- Left Distance walked: Various clinic distances  Assistive device utilized: Hurrycane Level of assistance: SBA Comments: Pt with no LUE arm swing and tends to hold hand in a fist. Cued for posture with gait and slowing down pace as pt has a tendency to move too fast.   PATIENT  EDUCATION: Education details: Fall prevention handout and went over with pt's sister and pt  Person educated: Patient Education method: Theatre stage manager Education comprehension: verbalized understanding  HOME EXERCISE PROGRAM: Access Code: [email or web address] URL: [email or web address] Date: 12/03/2021 Prepared by: Mickie Bail Plaster  Exercises - Sit to Stand Without Arm Support  - 1 x daily - 7 x weekly - 3 sets - 10 reps - Step sideways with arms reaching at counter   - 1 x daily - 7 x weekly - 3 sets - 10 reps - Alternating Step Backward with Support  - 1 x daily - 7 x weekly - 3 sets - 10 reps Pt reports he is also performing with wide BOS and reaching up to target     GOALS: Goals reviewed with patient? Yes  SHORT TERM GOALS: Target date: 12/26/2021  Pt will perform initial HEP w/min A from family  for improved strength, balance, transfers and gait.  Baseline: not established on eval  Goal status: MET  2.  Pt will improve MiniBest to 19/28 for decreased fall risk and improvement with compensatory stepping strategies.   Baseline: 17/28; 17/28 on 11/1 Goal status: NOT MET  3.  Pt will improve gait velocity to at least 2.9 ft/s with LRAD for improved gait efficiency and safety  Baseline: 2.5 ft/s w/hurrycane;  13.9 seconds = 2.35 ft/sec, with no AD on 12/24/21 Goal status: NOT MET  4.  Pt will improve 5 x STS to less than or equal to 15 seconds without UE support to demonstrate improved functional strength and transfer efficiency.   Baseline: 18.5s w/BUE support on reps 1-3 and heavy posterior bracing; 15.1 seconds on 12/24/21 with no UE support on 12/24/21 Goal status: MET  5.  Pt will improve normal TUG to less than or equal to 11 seconds w/LRAD for improved functional mobility and decreased fall risk.  Baseline: 13.72s without AD; 12.4 without AD on 12/24/21 Goal status: NOT MET  6.  Pt and family will be able to teach back and demonstrate fall  prevention techniques, including freezing strategies, for reduced fall risk and improved safety at home  Baseline: Pt able to verbalize w/min cues for recall, family not present for session  Goal status: IN PROGRESS   LONG TERM GOALS: Target date: 01/23/2022  Pt will perform final HEP w/min A from family for improved strength, balance, transfers and gait.  Baseline:  Goal status: INITIAL  2.  Pt will improve gait velocity to at least 3.2 ft/s w/LRAD for improved gait efficiency and safety  Baseline: 2.5 ft/s w/hurrycane  Goal status: REVISED  3.  Pt will improve cog TUG to less than or equal to 15 seconds for improved functional mobility and decreased fall risk.  Baseline: 19.04s without AD  Goal status: REVISED  4.  Pt will improve MiniBest to 19/28 for decreased fall risk and improvement with compensatory stepping strategies.   Baseline: 17/28 on 11/1 Goal status: REVISED  5.  Pt and family will verbalize understanding of local PD community resources, including fitness post DC.   Baseline:  Goal status: INITIAL   ASSESSMENT:  CLINICAL IMPRESSION: Pt's sister present during session today so reviewed fall prevention techniques and provided handout for home. Today's skilled PT session focused heavily on stepping strategies for balance and weight shifting. Pt with difficulty coordinating/planning movement with stepping initially, but does better with use of visual cues as a target to step to. Pt with more difficulty with SLS tasks on LLE, needing min guard and one episode of min A for balance. Will continue to progress towards LTGs.   OBJECTIVE IMPAIRMENTS Abnormal gait, decreased balance, decreased cognition, decreased coordination, decreased knowledge  of condition, decreased knowledge of use of DME, decreased mobility, difficulty walking, decreased safety awareness, impaired perceived functional ability, impaired vision/preception, and improper body mechanics.   ACTIVITY  LIMITATIONS carrying, lifting, bending, standing, squatting, stairs, transfers, dressing, reach over head, hygiene/grooming, locomotion level, and caring for others  PARTICIPATION LIMITATIONS: meal prep, cleaning, laundry, medication management, personal finances, interpersonal relationship, driving, shopping, community activity, occupation, and yard work  PERSONAL FACTORS Age, Education, Fitness, Past/current experiences, Time since onset of injury/illness/exacerbation, and 1 comorbidity: Lewy Body Dementia  are also affecting patient's functional outcome.   REHAB POTENTIAL: Fair due to poor prognosis of diagnosis   CLINICAL DECISION MAKING: Evolving/moderate complexity  EVALUATION COMPLEXITY: Moderate  PLAN: PT FREQUENCY: 2x/week  PT DURATION: 8 weeks  PLANNED INTERVENTIONS: Therapeutic exercises, Therapeutic activity, Neuromuscular re-education, Balance training, Gait training, Patient/Family education, Self Care, Joint mobilization, Stair training, Vestibular training, Canalith repositioning, DME instructions, Aquatic Therapy, Dry Needling, Electrical stimulation, Cryotherapy, Moist heat, Manual therapy, and Re-evaluation  PLAN FOR NEXT SESSION:  Stepping strategies. Add to HEP for deficits highlighted by outcome measures (add something possibly for forward stepping or SLS). Pt requesting to walk on treadmill and do boxing - very active. Slam balls, cone taps, lateral weight shifting to L side, LE coordination, depth perception, VOR? Floor transfers.   Arliss Journey, PT, DPT 12/31/2021, 11:02 AM

## 2021-12-31 NOTE — Patient Instructions (Signed)

## 2022-01-01 NOTE — Telephone Encounter (Signed)
LVM for pt to call back to schedule sleep study.  

## 2022-01-02 ENCOUNTER — Ambulatory Visit: Payer: PPO | Admitting: Physical Therapy

## 2022-01-02 VITALS — BP 115/69 | HR 68

## 2022-01-02 DIAGNOSIS — R2689 Other abnormalities of gait and mobility: Secondary | ICD-10-CM

## 2022-01-02 DIAGNOSIS — R2681 Unsteadiness on feet: Secondary | ICD-10-CM | POA: Diagnosis not present

## 2022-01-02 DIAGNOSIS — R293 Abnormal posture: Secondary | ICD-10-CM

## 2022-01-02 NOTE — Therapy (Signed)
OUTPATIENT PHYSICAL THERAPY NEURO TREATMENT   Patient Name: Tony [name] MRN: [id number] DOB:[birth year], 76 y.o., male Today's Date: 01/02/2022   PCP: Prince Solian, MD  REFERRING PROVIDER: Marcial Pacas, MD    PT End of Session - 01/02/22 1107     Visit Number 11    Number of Visits 17   Plus eval   Date for PT Re-Evaluation 01/30/22    Authorization Type [employer] Part B    PT Start Time 1104    PT Stop Time 1147    PT Time Calculation (min) 43 min    Equipment Utilized During Treatment Gait belt    Activity Tolerance Patient tolerated treatment well    Behavior During Therapy WFL for tasks assessed/performed                Past Medical History:  Diagnosis Date   Anxiety    Boil of buttock    High cholesterol    Imbalance    Low back pain    Parkinsons    Past Surgical History:  Procedure Laterality Date   CYST REMOVAL LEG  2014   Patient Active Problem List   Diagnosis Date Noted   History of falling 11/29/2021   Unsteadiness on feet 11/29/2021   Excessive sleepiness 11/08/2021   Cognitive impairment 12/25/2020   Anxiety 04/25/2020   Gait abnormality 03/30/2020   Parkinson's disease 03/30/2020   Memory loss 03/30/2020   Parkinsonism 03/30/2020   CELLULITIS AND ABSCESS OF FACE 02/01/2008   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/2006   HYPERLIPIDEMIA 06/30/2006   DIVERTICULOSIS, COLON 06/30/2006   SEBORRHEIC KERATOSIS 06/30/2006   BACK PAIN, LUMBAR 06/30/2006    ONSET DATE: 11/08/2021   REFERRING DIAG: G20 (ICD-10-CM) - Parkinson's disease R41.89 (ICD-10-CM) - Cognitive impairment F41.9 (ICD-10-CM) - Anxiety R26.9 (ICD-10-CM) - Gait abnormality G47.10 (ICD-10-CM) - Excessive sleepiness   THERAPY DIAG:  Unsteadiness on feet  Other abnormalities of gait and mobility  Abnormal posture  Rationale for Evaluation and Treatment Rehabilitation  SUBJECTIVE:                                                                                                                                                                                               SUBJECTIVE STATEMENT: Pt reported his R hemibody has been swollen. Noted significant edema in R hand. Going to see PCP next week. Inquiring about use of rollator.    Pt accompanied by: Daughter Heather   PERTINENT HISTORY: DaTSCAN in July 2022 showed bilateral decreased radioactive tracing in the putamen and asymmetric decreased radiotracer activity in the head of the  right caudate nucleus His cognitive impairment progress quickly compared to the idiopathic typical Parkinson's patient, he seems to have less optimal response to Sinemet, differentiation diagnosis including idiopathic Parkinson's disease with cognitive impairment versus Lewy body dementia  PAIN:  Are you having pain? No  PRECAUTIONS: Fall  WEIGHT BEARING RESTRICTIONS No  FALLS: Has patient fallen in last 6 months? Yes. Number of falls ~6 or more   PLOF: Independent  PATIENT GOALS "Work on my dexterity"    OBJECTIVE:   DIAGNOSTIC FINDINGS: DaTSCAN in July 2022 showed bilateral decreased radioactive tracing in the putamen and asymmetric decreased radiotracer activity in the head of the right caudate nucleus   COGNITION: Overall cognitive status: Impaired Noted saccadic intrusions of L eye during eval   POSTURE: rounded shoulders, forward head, and flexed trunk    TODAY'S TREATMENT:  Ther Act  Lengthy discussion regarding potential for R shoulder injury, as pt had [place] occur last week. Pt unable to move R shoulder into abduction or flexion without significant compensation strategies and reports pain at end range. Pt TTP on lateral aspect of ankle and ATFL. Pt unsure if he sprained ankle with his fall, but encouraged pt to ask PCP about the swelling in hand and ankle. Pt and daughter verbalized understanding.   Ther Ex  SciFit multi-peaks level 9 for 8 minutes using BUE/BLEs for neural  priming for reciprocal movement, dynamic cardiovascular warmup and increased amplitude of stepping. RPE of 9/10 following activity    Gait Training Gait pattern: step through pattern, decreased stride length, trunk flexed, and narrow BOS Distance walked: 230' Assistive device utilized: Environmental consultant - 4 wheeled Level of assistance: SBA Comments: Provided education on proper management of rollator (brakes, safety w/transfers) and pt able to teach back without error. Noted increased cadence w/use of rollator, min cues to maintain close distance to AD to reduce forward lean. Pt reported he has a 4-wheeled walker at home that is a little different than the rollator. Will bring it next session for therapist to look at.    GAIT: Gait pattern: step through pattern, decreased arm swing- Left, decreased step length- Left, decreased stride length, decreased hip/knee flexion- Right, decreased hip/knee flexion- Left, decreased ankle dorsiflexion- Right, decreased ankle dorsiflexion- Left, shuffling, decreased trunk rotation, trunk flexed, poor foot clearance- Right, and poor foot clearance- Left Distance walked: Various clinic distances  Assistive device utilized: Hurrycane Level of assistance: SBA Comments: Pt with no LUE arm swing and tends to hold hand in a fist. Cued for posture with gait and slowing down pace as pt has a tendency to move too fast.    *At end of session, pt reported feeling nauseous but denied ginger ale or crackers. Removed pt's watch off L wrist and noted significant indentation on wrist from watch band. Pt suddenly reported pain in LUE, was not able to provide rating. Educated pt on keeping arm propped up at all times when seated and encouraged pt to go to ED if swelling gets worse. Pt and daughter verbalized understanding.   PATIENT EDUCATION: Education details: See above Person educated: Patient Education method: Theatre stage manager Education comprehension: verbalized  understanding   HOME EXERCISE PROGRAM: Access Code: [email or web address] URL: [email or web address] Date: 12/03/2021 Prepared by: Mickie Bail Tyeler Goedken  Exercises - Sit to Stand Without Arm Support  - 1 x daily - 7 x weekly - 3 sets - 10 reps - Step sideways with arms reaching at counter   - 1 x daily - 7 x weekly - 3 sets -  10 reps - Alternating Step Backward with Support  - 1 x daily - 7 x weekly - 3 sets - 10 reps Pt reports he is also performing with wide BOS and reaching up to target     GOALS: Goals reviewed with patient? Yes  SHORT TERM GOALS: Target date: 12/26/2021  Pt will perform initial HEP w/min A from family  for improved strength, balance, transfers and gait.  Baseline: not established on eval  Goal status: MET  2.  Pt will improve MiniBest to 19/28 for decreased fall risk and improvement with compensatory stepping strategies.   Baseline: 17/28; 17/28 on 11/1 Goal status: NOT MET  3.  Pt will improve gait velocity to at least 2.9 ft/s with LRAD for improved gait efficiency and safety  Baseline: 2.5 ft/s w/hurrycane;  13.9 seconds = 2.35 ft/sec, with no AD on 12/24/21 Goal status: NOT MET  4.  Pt will improve 5 x STS to less than or equal to 15 seconds without UE support to demonstrate improved functional strength and transfer efficiency.   Baseline: 18.5s w/BUE support on reps 1-3 and heavy posterior bracing; 15.1 seconds on 12/24/21 with no UE support on 12/24/21 Goal status: MET  5.  Pt will improve normal TUG to less than or equal to 11 seconds w/LRAD for improved functional mobility and decreased fall risk.  Baseline: 13.72s without AD; 12.4 without AD on 12/24/21 Goal status: NOT MET  6.  Pt and family will be able to teach back and demonstrate fall prevention techniques, including freezing strategies, for reduced fall risk and improved safety at home  Baseline: Pt able to verbalize w/min cues for recall, family not present for session  Goal status: IN  PROGRESS   LONG TERM GOALS: Target date: 01/23/2022  Pt will perform final HEP w/min A from family for improved strength, balance, transfers and gait.  Baseline:  Goal status: INITIAL  2.  Pt will improve gait velocity to at least 3.2 ft/s w/LRAD for improved gait efficiency and safety  Baseline: 2.5 ft/s w/hurrycane  Goal status: REVISED  3.  Pt will improve cog TUG to less than or equal to 15 seconds for improved functional mobility and decreased fall risk.  Baseline: 19.04s without AD  Goal status: REVISED  4.  Pt will improve MiniBest to 19/28 for decreased fall risk and improvement with compensatory stepping strategies.   Baseline: 17/28 on 11/1 Goal status: REVISED  5.  Pt and family will verbalize understanding of local PD community resources, including fitness post DC.   Baseline:  Goal status: INITIAL   ASSESSMENT:  CLINICAL IMPRESSION: Emphasis of skilled PT session on gait training w/rollator and pt education. Noted significant swelling in pt's L hand and minor swelling in L ankle, which therapist from previous session did not note earlier in week. Removed pt's watch from L wrist as it was cutting off circulation and instructed pt to go to ED if swelling progresses. Pt initially denied pain but by end of session, reported pain in LUE and L ankle. Unsure if from the fall or other sources. Pt reported he would bring his 4-wheeled walker to next session for therapist to look at, as it is different from the rollator. Continue POC.   OBJECTIVE IMPAIRMENTS Abnormal gait, decreased balance, decreased cognition, decreased coordination, decreased knowledge of condition, decreased knowledge of use of DME, decreased mobility, difficulty walking, decreased safety awareness, impaired perceived functional ability, impaired vision/preception, and improper body mechanics.   ACTIVITY LIMITATIONS carrying, lifting,  bending, standing, squatting, stairs, transfers, dressing, reach over  head, hygiene/grooming, locomotion level, and caring for others  PARTICIPATION LIMITATIONS: meal prep, cleaning, laundry, medication management, personal finances, interpersonal relationship, driving, shopping, community activity, occupation, and yard work  PERSONAL FACTORS Age, Education, Fitness, Past/current experiences, Time since onset of injury/illness/exacerbation, and 1 comorbidity: Lewy Body Dementia  are also affecting patient's functional outcome.   REHAB POTENTIAL: Fair due to poor prognosis of diagnosis   CLINICAL DECISION MAKING: Evolving/moderate complexity  EVALUATION COMPLEXITY: Moderate  PLAN: PT FREQUENCY: 2x/week  PT DURATION: 8 weeks  PLANNED INTERVENTIONS: Therapeutic exercises, Therapeutic activity, Neuromuscular re-education, Balance training, Gait training, Patient/Family education, Self Care, Joint mobilization, Stair training, Vestibular training, Canalith repositioning, DME instructions, Aquatic Therapy, Dry Needling, Electrical stimulation, Cryotherapy, Moist heat, Manual therapy, and Re-evaluation  PLAN FOR NEXT SESSION:  How is the swelling? Did pt bring walker? Rollator outdoors.  Stepping strategies. Add to HEP for deficits highlighted by outcome measures (add something possibly for forward stepping or SLS). Pt requesting to walk on treadmill and do boxing - very active. Slam balls, cone taps, lateral weight shifting to L side, LE coordination, depth perception, VOR? Floor transfers.   Cruzita Lederer Penny Arrambide, PT, DPT 01/02/2022, 11:53 AM

## 2022-01-07 ENCOUNTER — Ambulatory Visit: Payer: PPO | Admitting: Physical Therapy

## 2022-01-07 DIAGNOSIS — R2681 Unsteadiness on feet: Secondary | ICD-10-CM

## 2022-01-07 DIAGNOSIS — R2689 Other abnormalities of gait and mobility: Secondary | ICD-10-CM

## 2022-01-07 DIAGNOSIS — R293 Abnormal posture: Secondary | ICD-10-CM

## 2022-01-07 NOTE — Therapy (Signed)
OUTPATIENT PHYSICAL THERAPY NEURO TREATMENT   Patient Name: Tony [name] MRN: [id number] DOB:1945/08/18, 76 y.o., male Today's Date: 01/07/2022   PCP: Prince Solian, MD  REFERRING PROVIDER: Marcial Pacas, MD    PT End of Session - 01/07/22 1058     Visit Number 12    Number of Visits 17   Plus eval   Date for PT Re-Evaluation 01/30/22    Authorization Type [employer] Part B    PT Start Time 1017    PT Stop Time 1058    PT Time Calculation (min) 41 min    Equipment Utilized During Treatment Gait belt    Activity Tolerance Patient tolerated treatment well    Behavior During Therapy WFL for tasks assessed/performed                 Past Medical History:  Diagnosis Date   Anxiety    Boil of buttock    High cholesterol    Imbalance    Low back pain    Parkinsons    Past Surgical History:  Procedure Laterality Date   CYST REMOVAL LEG  2014   Patient Active Problem List   Diagnosis Date Noted   History of falling 11/29/2021   Unsteadiness on feet 11/29/2021   Excessive sleepiness 11/08/2021   Cognitive impairment 12/25/2020   Anxiety 04/25/2020   Gait abnormality 03/30/2020   Parkinson's disease 03/30/2020   Memory loss 03/30/2020   Parkinsonism 03/30/2020   CELLULITIS AND ABSCESS OF FACE 02/01/2008   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/2006   HYPERLIPIDEMIA 06/30/2006   DIVERTICULOSIS, COLON 06/30/2006   SEBORRHEIC KERATOSIS 06/30/2006   BACK PAIN, LUMBAR 06/30/2006    ONSET DATE: 11/08/2021   REFERRING DIAG: G20 (ICD-10-CM) - Parkinson's disease R41.89 (ICD-10-CM) - Cognitive impairment F41.9 (ICD-10-CM) - Anxiety R26.9 (ICD-10-CM) - Gait abnormality G47.10 (ICD-10-CM) - Excessive sleepiness   THERAPY DIAG:  Unsteadiness on feet  Other abnormalities of gait and mobility  Abnormal posture  Rationale for Evaluation and Treatment Rehabilitation  SUBJECTIVE:                                                                                                                                                                                               SUBJECTIVE STATEMENT: Will being seeing his PCP this Thursday. Swelling in L hand is getting better. Brought in his rollator from home (rollator is an older one that was donated by a friend). No falls. L arm is not as sore, and is able to move it better.    Pt accompanied by: Daughter Heather   PERTINENT HISTORY:  DaTSCAN in July 2022 showed bilateral decreased radioactive tracing in the putamen and asymmetric decreased radiotracer activity in the head of the right caudate nucleus His cognitive impairment progress quickly compared to the idiopathic typical Parkinson's patient, he seems to have less optimal response to Sinemet, differentiation diagnosis including idiopathic Parkinson's disease with cognitive impairment versus Lewy body dementia  PAIN:  Are you having pain? No  PRECAUTIONS: Fall  WEIGHT BEARING RESTRICTIONS No  FALLS: Has patient fallen in last 6 months? Yes. Number of falls ~6 or more   PLOF: Independent  PATIENT GOALS "Work on my dexterity"    OBJECTIVE:   DIAGNOSTIC FINDINGS: DaTSCAN in July 2022 showed bilateral decreased radioactive tracing in the putamen and asymmetric decreased radiotracer activity in the head of the right caudate nucleus   COGNITION: Overall cognitive status: Impaired Noted saccadic intrusions of L eye during eval   POSTURE: rounded shoulders, forward head, and flexed trunk    TODAY'S TREATMENT:  Ther Act  Pt with improved swelling in L hand cimpared to last session. Pt has been elevating it and trying to move it has much as he can. Pt also with less swelling in L ankle and reports no pain in ankle. Pt has been working on elevating it at home. Pt to see PCP on Thursday and dicussed mentioning these just to be safe and continue to elevate ankle/hand and keep them moving.  Pt with an older rollator that was  donated to him that does not have proper working brakes. Discussed ways to obtain rollator: purchasing one out of pocket on [employer]/Medical supply store or getting it through insurance. Pt and wife ultimately decided they wanted to buy the one that pt has been using in the clinic from [facility]. Showed purchase information for this with pt's spouse planning on getting it today.  Educated on safety with rollator - brake management during gait and for transfers and how to lock/unlock brakes, proper height and how to adjust handles on rollator.   Gait Training Gait pattern: step through pattern, decreased stride length, trunk flexed, and narrow BOS Distance walked: 230' Assistive device utilized: Environmental consultant - 4 wheeled Level of assistance: SBA Comments: Performed 2 laps with rollator with focus on tall posture, keeping feet under the seat to maintain a closer distance to rollator and focus on stepping more inside in the middle (vs. On to the L). Practiced a few times going to sit down with rollator and turning all the way, backing up to chair, locking brakes and then sitting down. Pt takes incr time figuring out with LUE how to lock brakes, but pt able to figure it out with minimal to no cues.   Showed how to sit down in rollator if pt fatigue, but making sure that pt brings rollator to a sturdy surface/wall so that there is something behind him. Practiced pt bringing rollator to mat, locking brakes, and turning (all the way around) to sit down and then standing up and turning all the way around. Performed 3 reps total.   With 6 cones, performed weaving in and out for obstacle negotation over 20', down and back x2 reps with focused on staying closer to rollator. Pt able to negotiate obstacles well.      PATIENT EDUCATION: Education details: See above Self Care and Therapeutic Activity sections  Person educated: Patient Education method: Explanation and Handouts Education comprehension: verbalized  understanding   HOME EXERCISE PROGRAM: Access Code: [email or web address] URL: [email or web address] Date: 12/03/2021 Prepared by: Mickie Bail Plaster  Exercises - Sit to Stand Without Arm Support  - 1 x daily - 7 x weekly - 3 sets - 10 reps - Step sideways with arms reaching at counter   - 1 x daily - 7 x weekly - 3 sets - 10 reps - Alternating Step Backward with Support  - 1 x daily - 7 x weekly - 3 sets - 10 reps Pt reports he is also performing with wide BOS and reaching up to target     GOALS: Goals reviewed with patient? Yes  SHORT TERM GOALS: Target date: 12/26/2021  Pt will perform initial HEP w/min A from family  for improved strength, balance, transfers and gait.  Baseline: not established on eval  Goal status: MET  2.  Pt will improve MiniBest to 19/28 for decreased fall risk and improvement with compensatory stepping strategies.   Baseline: 17/28; 17/28 on 11/1 Goal status: NOT MET  3.  Pt will improve gait velocity to at least 2.9 ft/s with LRAD for improved gait efficiency and safety  Baseline: 2.5 ft/s w/hurrycane;  13.9 seconds = 2.35 ft/sec, with no AD on 12/24/21 Goal status: NOT MET  4.  Pt will improve 5 x STS to less than or equal to 15 seconds without UE support to demonstrate improved functional strength and transfer efficiency.   Baseline: 18.5s w/BUE support on reps 1-3 and heavy posterior bracing; 15.1 seconds on 12/24/21 with no UE support on 12/24/21 Goal status: MET  5.  Pt will improve normal TUG to less than or equal to 11 seconds w/LRAD for improved functional mobility and decreased fall risk.  Baseline: 13.72s without AD; 12.4 without AD on 12/24/21 Goal status: NOT MET  6.  Pt and family will be able to teach back and demonstrate fall prevention techniques, including freezing strategies, for reduced fall risk and improved safety at home  Baseline: Pt able to verbalize w/min cues for recall, family not present for session  Goal status: IN  PROGRESS   LONG TERM GOALS: Target date: 01/23/2022  Pt will perform final HEP w/min A from family for improved strength, balance, transfers and gait.  Baseline:  Goal status: INITIAL  2.  Pt will improve gait velocity to at least 3.2 ft/s w/LRAD for improved gait efficiency and safety  Baseline: 2.5 ft/s w/hurrycane  Goal status: REVISED  3.  Pt will improve cog TUG to less than or equal to 15 seconds for improved functional mobility and decreased fall risk.  Baseline: 19.04s without AD  Goal status: REVISED  4.  Pt will improve MiniBest to 19/28 for decreased fall risk and improvement with compensatory stepping strategies.   Baseline: 17/28 on 11/1 Goal status: REVISED  5.  Pt and family will verbalize understanding of local PD community resources, including fitness post DC.   Baseline:  Goal status: INITIAL   ASSESSMENT:  CLINICAL IMPRESSION: Pt brought in his rollator from home that is older and was donated to him. The brakes on this rollator did not work. Discussed safety concerns with this and how to go about obtaining a new one. Pt and pt's spouse prefer to purchase one from [facility] and will be doing so this week. Continued to focus on gait training with rollator with safety with transfers, brake management, and gait training. Pt taking incr time to lock brakes with LUE, but did improve with incr reps. Will continue to practice. Will continue per POC.  OBJECTIVE IMPAIRMENTS Abnormal gait, decreased balance, decreased cognition, decreased coordination, decreased knowledge  of condition, decreased knowledge of use of DME, decreased mobility, difficulty walking, decreased safety awareness, impaired perceived functional ability, impaired vision/preception, and improper body mechanics.   ACTIVITY LIMITATIONS carrying, lifting, bending, standing, squatting, stairs, transfers, dressing, reach over head, hygiene/grooming, locomotion level, and caring for others  PARTICIPATION  LIMITATIONS: meal prep, cleaning, laundry, medication management, personal finances, interpersonal relationship, driving, shopping, community activity, occupation, and yard work  PERSONAL FACTORS Age, Education, Fitness, Past/current experiences, Time since onset of injury/illness/exacerbation, and 1 comorbidity: Lewy Body Dementia  are also affecting patient's functional outcome.   REHAB POTENTIAL: Fair due to poor prognosis of diagnosis   CLINICAL DECISION MAKING: Evolving/moderate complexity  EVALUATION COMPLEXITY: Moderate  PLAN: PT FREQUENCY: 2x/week  PT DURATION: 8 weeks  PLANNED INTERVENTIONS: Therapeutic exercises, Therapeutic activity, Neuromuscular re-education, Balance training, Gait training, Patient/Family education, Self Care, Joint mobilization, Stair training, Vestibular training, Canalith repositioning, DME instructions, Aquatic Therapy, Dry Needling, Electrical stimulation, Cryotherapy, Moist heat, Manual therapy, and Re-evaluation  PLAN FOR NEXT SESSION:  did pt get new rollator? Practice outdoors. Stepping strategies. Add to HEP for deficits highlighted by outcome measures (add something possibly for forward stepping or SLS). Pt requesting to walk on treadmill and do boxing - very active. Slam balls, cone taps, lateral weight shifting to L side, LE coordination, depth perception, VOR? Floor transfers.   Arliss Journey, PT, DPT 01/07/2022, 10:59 AM

## 2022-01-08 NOTE — Therapy (Signed)
OUTPATIENT OCCUPATIONAL THERAPY PARKINSON'S EVALUATION  Patient Name: Tony [name] MRN: [id number] DOB:30-May-1945, 76 y.o., male Today's Date: 01/09/2022  PCP: Dr. Steva Ready REFERRING PROVIDER: Dr. Krista Blue   OT End of Session - 01/09/22 1142     Visit Number 1    Number of Visits 25    Date for OT Re-Evaluation 04/03/22    Authorization Type [employer]    Authorization - Visit Number 1    Authorization - Number of Visits 10    Progress Note Due on Visit 10    OT Start Time 1025    OT Stop Time 1100    OT Time Calculation (min) 35 min    Behavior During Therapy [facility] for tasks assessed/performed             Past Medical History:  Diagnosis Date   Anxiety    Boil of buttock    High cholesterol    Imbalance    Low back pain    Parkinsons    Past Surgical History:  Procedure Laterality Date   CYST REMOVAL LEG  2014   Patient Active Problem List   Diagnosis Date Noted   History of falling 11/29/2021   Unsteadiness on feet 11/29/2021   Excessive sleepiness 11/08/2021   Cognitive impairment 12/25/2020   Anxiety 04/25/2020   Gait abnormality 03/30/2020   Parkinson's disease 03/30/2020   Memory loss 03/30/2020   Parkinsonism 03/30/2020   CELLULITIS AND ABSCESS OF FACE 02/01/2008   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/2006   HYPERLIPIDEMIA 06/30/2006   DIVERTICULOSIS, COLON 06/30/2006   SEBORRHEIC KERATOSIS 06/30/2006   BACK PAIN, LUMBAR 06/30/2006    ONSET DATE: 11/29/21  REFERRING DIAG:  Diagnosis  R26.81 (ICD-10-CM) - Unsteadiness on feet  Z91.81 (ICD-10-CM) - History of falling  G20.C (ICD-10-CM) - Parkinsonism, unspecified Parkinsonism type    THERAPY DIAG:  Other lack of coordination - Plan: Ot plan of care cert/re-cert  Muscle weakness (generalized) - Plan: Ot plan of care cert/re-cert  Unsteadiness on feet - Plan: Ot plan of care cert/re-cert  Abnormal posture - Plan: Ot plan of care cert/re-cert  Other abnormalities  of gait and mobility - Plan: Ot plan of care cert/re-cert  Frontal lobe and executive function deficit - Plan: Ot plan of care cert/re-cert  Attention and concentration deficit - Plan: Ot plan of care cert/re-cert  Other symptoms and signs involving the nervous system - Plan: Ot plan of care cert/re-cert  Rationale for Evaluation and Treatment: Rehabilitation  SUBJECTIVE:   SUBJECTIVE STATEMENT: Pt reports difficulty with buttons [name] Pt accompanied by: self  PERTINENT HISTORY:  per Dr. Krista Blue DaTSCAN in July 2022 showed bilateral decreased radioactive tracing in the putamen and asymmetric decreased radiotracer activity in the head of the right caudate nucleus His cognitive impairment progress quickly compared to the idiopathic typical Parkinson's patient, he seems to have less optimal response to Sinemet, differentiation diagnosis including idiopathic Parkinson's disease with cognitive impairment versus Lewy body dementia. PMH: anxiety, hyperlipidemia, back pain  PRECAUTIONS: Fall  WEIGHT BEARING RESTRICTIONS: No  PAIN:  Are you having pain? No  FALLS: Has patient fallen in last 6 months? Yes. Number of falls 5  LIVING ENVIRONMENT: Lives with: lives with their spouse, dtr's assist Lives in: [place] Stairs: No Has following equipment at home:  rollator  PLOF: Independent  PATIENT GOALS: increased ease with dressing and fine motor coordination  OBJECTIVE:   HAND DOMINANCE: Right  ADLs: Overall ADLs: Pt 's dtr's assist Transfers/ambulation related  to ADLs: Eating: increased spills needs assist with cutting food Grooming: mod I UB Dressing:  able to donn shirt with setup, difficulty donning/ doffing jacket LB Dressing: min A pants/ socks , uses slip on shoes,  Toileting: mod I with rollator  Bathing: distant supervision Tub Shower transfers:stepping into shower /tub needs supervision, has grab bar   IADLs: Goes to PD cycle on Tues/ Thurs Handwriting: 100%  legible  MOBILITY STATUS: Hx of falls, start hesitation, and uses rollator  POSTURE COMMENTS:  rounded shoulders and forward head   FUNCTIONAL OUTCOME MEASURES: Fastening/unfastening 3 buttons: unable Physical performance test: PPT#2 (simulated eating) 28.65 & PPT#4 (donning/doffing jacket): NT  COORDINATION: 9 Hole Peg test: Right: 69.57 sec; Left: 2 mins 20  sec Box and Blocks:  Right 21 blocks, Left 11blocks Resting tremor LUE UE ROM:   shoulder flexion: RUE 130. LUE 125 elbow flexion/ extension WFLS   UE MMT:   Not tested  SENSATION: Not tested  Vision:not formally tested, TBA in functional context  COGNITION: Overall cognitive status: Impaired  OBSERVATIONS: Bradykinesia   TODAY'S TREATMENT:                                                                                                                              DATE: n/a eval only    PATIENT EDUCATION: Education details: role of OT, potential goals Person educated: Patient, Spouse, and Child(ren) Education method: Explanation Education comprehension: verbalized understanding  HOME EXERCISE PROGRAM: N/A  GOALS:  Goals reviewed with patient? Yes  SHORT TERM GOALS: Target date: 02/06/22  I with PD specific HEP Baseline: Goal status: INITIAL  2.  Pt will verbalize understanding of adapted strategies to maximize safety and I with ADLs/ [place] to minimize fall risk . Baseline:  Goal status: INITIAL  3.  Pt will demonstrate improved ease with feeding as evidenced by decreasing PPT#2 (self feeding) by 3 secs Baseline:  Goal status: INITIAL  4.  Pt will demonstrate improved UE functional use as evidenced by increasing box and blocks score by 4 blocks bilaterally. Baseline:  Right 21 blocks, Left 11 blocks Goal status: INITIAL  5.    Check PPT#4 and set goal prn Baseline:  Goal status: INITIAL    LONG TERM GOALS: Target date: 04/03/22  Pt will verbalize understanding of ways to prevent future  PD  related complications and PD community resources. Baseline:  Goal status: INITIAL  2. Pt will demonstrate improved fine motor coordination for ADLs as evidenced by decreasing 9 hole peg test score for RUE by 4 secs Baseline: 9 Hole Peg test: Right: 69.57 sec; Left: 2 mins 20  sec Goal status: INITIAL  3.  Pt will demonstrate improved fine motor coordination for ADLs as evidenced by decreasing 9 hole peg test score for LUE  to 2 mins or less Baseline: 9 Hole Peg test: Right: 69.57 sec; Left: 2 mins 20  sec Goal status: INITIAL  4.Pt will demonstrate understanding of  memory compensations and ways to keep thinking skills sharp Baseline:  Goal status: INITIAL   ASSESSMENT:  CLINICAL IMPRESSION:  Patient is a 76 y.o. M who was seen today for occupational therapy eval in presence of idiopathic Parkinson's disease with cognitive impairment versus Lewy body dementia. PERFORMANCE DEFICITS: in functional skills including ADLs, IADLs, coordination, dexterity, strength, flexibility, Fine motor control, Gross motor control, mobility, balance, endurance, decreased knowledge of precautions, decreased knowledge of use of DME, and UE functional use, cognitive skills including memory, problem solving, safety awareness, and thought, and psychosocial skills including coping strategies, environmental adaptation, habits, interpersonal interactions, and routines and behaviors.   IMPAIRMENTS: are limiting patient from ADLs, IADLs, play, leisure, and social participation.   COMORBIDITIES:  may have co-morbidities  that affects occupational performance. Patient will benefit from skilled OT to address above impairments and improve overall function.  MODIFICATION OR ASSISTANCE TO COMPLETE EVALUATION: No modification of tasks or assist necessary to complete an evaluation.  OT OCCUPATIONAL PROFILE AND HISTORY: Detailed assessment: Review of records and additional review of physical, cognitive, psychosocial history  related to current functional performance.  CLINICAL DECISION MAKING: LOW - limited treatment options, no task modification necessary  REHAB POTENTIAL: Good  EVALUATION COMPLEXITY: Low    PLAN:  OT FREQUENCY: 2x/week  OT DURATION: 12 weeks plus eval to account for scheduling  PLANNED INTERVENTIONS: self care/ADL training, therapeutic exercise, therapeutic activity, neuromuscular re-education, manual therapy, passive range of motion, gait training, balance training, functional mobility training, aquatic therapy, splinting, ultrasound, paraffin, fluidotherapy, moist heat, cryotherapy, patient/family education, cognitive remediation/compensation, visual/perceptual remediation/compensation, energy conservation, coping strategies training, DME and/or AE instructions, and Re-evaluation  RECOMMENDED OTHER SERVICES: PT, ST  CONSULTED AND AGREED WITH PLAN OF CARE: Patient and family member/caregiver  PLAN FOR NEXT SESSION: check PPT#4 and set goal prn, coordination HEP, safety for ADLS(bathing seated, strategies for dressing)-pt with hx of falls   Adryen Cookson, OT 01/09/2022, 12:26 PM

## 2022-01-08 NOTE — Therapy (Unsigned)
OUTPATIENT SPEECH LANGUAGE PATHOLOGY TREATMENT NOTE   Patient Name: Marc Leichter MRN: [id number] DOB:11/05/1945, 76 y.o., male Today's Date: 01/09/2022  PCP: Prince Solian, MD REFERRING PROVIDER: Marcial Pacas, MD    End of Session - 01/09/22 0927     Visit Number 4    Number of Visits 17    Date for SLP Re-Evaluation 02/11/22    Authorization Type [employer]    SLP Start Time 0930    SLP Stop Time  1015    SLP Time Calculation (min) 45 min    Activity Tolerance Patient tolerated treatment well               Past Medical History:  Diagnosis Date   Anxiety    Boil of buttock    High cholesterol    Imbalance    Low back pain    Parkinsons    Past Surgical History:  Procedure Laterality Date   CYST REMOVAL LEG  2014   Patient Active Problem List   Diagnosis Date Noted   History of falling 11/29/2021   Unsteadiness on feet 11/29/2021   Excessive sleepiness 11/08/2021   Cognitive impairment 12/25/2020   Anxiety 04/25/2020   Gait abnormality 03/30/2020   Parkinson's disease 03/30/2020   Memory loss 03/30/2020   Parkinsonism 03/30/2020   CELLULITIS AND ABSCESS OF FACE 02/01/2008   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   THALASSEMIA [place] 09/12/2006   HYPERLIPIDEMIA 06/30/2006   DIVERTICULOSIS, COLON 06/30/2006   SEBORRHEIC KERATOSIS 06/30/2006   BACK PAIN, LUMBAR 06/30/2006    ONSET DATE: 11/29/2021 referral  REFERRING DIAG: R26.81 (ICD-10-CM) - Unsteadiness on feet Z91.81 (ICD-10-CM) - History of falling G20.C (ICD-10-CM) - Parkinsonism, unspecified Parkinsonism type   THERAPY DIAG:  Dysarthria and anarthria  Cognitive communication deficit  Dysphagia, unspecified type  Rationale for Evaluation and Treatment Rehabilitation  SUBJECTIVE:   SUBJECTIVE STATEMENT: "I got book and did the first lesson"   PAIN:  Are you having pain? No   OBJECTIVE:   PATIENT REPORTED OUTCOME MEASURES (PROM): Communication Participation Item Bank: 13 Quite a  bit: communicating quickly, talking to people do not know, asking questions, having long or detailed conversations, getting turn in fast moving conversation, persuasion   TODAY'S TREATMENT:          01-09-22: SLP assisted pt in determining best routine to facilitate daily HEP practice, see handout. Education on reasoning for daily practice. PROM never returned so addressed this date, see above. During administration generated strategies to A pt in overcoming primary concerns: asking for more time, faciliating one on one conversations, sitting next to preferred communication partner during group dinners. Demonstrated all speak out exercises to aid in accurate completion during HEP. Pt averages as follows during brief practice with rare min-A: 83 dB sustained AH, 78 dB reading, 72 dB cognitive exercise.   12/26/21: Reviewed general swallow precautions and strategies for drool management. He has noticed reduced drool since prior session by being mindful to swallow frequently and before speaking. Today he required verbal cues 2x to swallow when speaking due to saliva observed in corners of his mouth. They go out to eat every Wednesday evening with a large group. We generated a strategy of mentally preparing on the way to dinner to swallow with intent to aid carryover of swallow precautions in a distracting environment. Also generated strategy of his wife providing discrete hand signals to remind him to swallow or get louder when out with friends. Initiated HEP for dysarthria. He ordered Speak Out workbook yesterday.  Targeted volume and intelligibility using Speak Out! Lesson 1. Pt required frequent mod  verbal cues, modeling, for volume and breath support.   Pt averages the following volume levels:  Sustained AH: 85 dB  Counting:85  Reading (phrases): 85  Cognitive Exercise: 72 Required frequent max  verbal  & visual cues, modeling for carryover of intent /volume answering simple questions following  structured practice.   PATIENT EDUCATION: Education details: see above Person educated: Patient and Spouse Education method: Customer service manager Education comprehension: verbalized understanding, returned demonstration, and needs further education  HOME EXERCISE PROGRAM: Speak Out   GOALS: Goals reviewed with patient? Yes  SHORT TERM GOALS: Target date: 01/14/2022  Pt will complete clinical swallow evaluation (CSE) to assess need for objective swallow evaluation  Baseline: 12/24/2021 Goal status: MET  2.  Pt will complete instrumental swallow evaluation, if indicated per CSE to objectively assess swallow function and efficacy Baseline:  Goal status: INITIAL  3.  Pt will demonstrate communication strategies to aid in attention, processing, and coherent discourse in 15 minute conversation with occasional min-A Baseline:  Goal status: INITIAL  4.  Pt will meet dB targets for warm up and counting Speak Out exercises with rare min-A over 1 week period Baseline:  Goal status: INITIAL  5.  Pt's care partner will teach back strategies and compensations to aid in pt's improved safety whilst eating meals at home over 1 week period Baseline:  Goal status: INITIAL   LONG TERM GOALS: Target date: 02/11/22  Pt will report daily HEP adherence for dysphagia and dysarthria with min-A from care partner over 1 week period Baseline:  Goal status: INITIAL  2.  Pt's average volume will exceed 68 dB in answering simple, 10 minute conversation, such as answering personally relevant questions with occasional min-A over 2 sessions Baseline:  Goal status: INITIAL  3.  Pt will demonstrate saliva management techniques with compensations PRN and rare verbal cues over 30 minutes Baseline:  Goal status: INITIAL  4.  Pt's care partner will report carryover of strategies and compensations to aid in improved safety whilst eating meals at home and subjective report of reduced instances of  coughing during meal times over 1 week period Baseline:  Goal status: INITIAL  5.  Pt's volume will average greater than 70 dB during Speak Out reading and cognitive exercises with occasional min-A over 2 sessions  Baseline:  Goal status: INITIAL   ASSESSMENT:  CLINICAL IMPRESSION: Patient is a 76 y.o. 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 Pt presents with mild dysarthria, cognitive impairment, and dysphagia. 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 Will plan to complete clinical swallow evaluation during first therapy session to assess swallow and potential need for objective instrumental study. Cognition impairments in areas of attention, memory, executive functioning resulting in needing A for finance, schedule, and medication management. Difficulties processing or speaking in conversations with more than 1 partner. Skilled ST is indicated.   OBJECTIVE IMPAIRMENTS: Objective impairments include attention, memory, executive functioning, expressive language, dysarthria, and dysphagia. These impairments are limiting patient from managing medications, managing appointments, managing finances, household responsibilities, ADLs/IADLs, effectively communicating at home and in community, and safety when swallowing.Factors affecting potential to achieve goals and functional outcome are ability to learn/carryover information and medical prognosis.. Patient will benefit from skilled SLP services to address above impairments and improve overall function.  REHAB POTENTIAL: Fair (degenerative dx)  PLAN:  SLP FREQUENCY: 2x/week  SLP DURATION: 8 weeks  PLANNED INTERVENTIONS: Aspiration precaution training, Pharyngeal strengthening exercises, Diet toleration management , Language facilitation,  Environmental controls, Trials of upgraded texture/liquids, Cueing hierachy, Cognitive reorganization, Internal/external aids, Functional tasks, SLP instruction and feedback, Compensatory strategies, Patient/family education, and Re-evaluation    Su Monks, CCC-SLP 01/09/2022, 9:28 AM

## 2022-01-09 ENCOUNTER — Ambulatory Visit: Payer: PPO | Admitting: Speech Pathology

## 2022-01-09 ENCOUNTER — Ambulatory Visit: Payer: PPO | Admitting: Physical Therapy

## 2022-01-09 ENCOUNTER — Ambulatory Visit: Payer: PPO | Admitting: Occupational Therapy

## 2022-01-09 ENCOUNTER — Encounter: Payer: Self-pay | Admitting: Occupational Therapy

## 2022-01-09 DIAGNOSIS — R41841 Cognitive communication deficit: Secondary | ICD-10-CM

## 2022-01-09 DIAGNOSIS — R278 Other lack of coordination: Secondary | ICD-10-CM

## 2022-01-09 DIAGNOSIS — R471 Dysarthria and anarthria: Secondary | ICD-10-CM

## 2022-01-09 DIAGNOSIS — R2689 Other abnormalities of gait and mobility: Secondary | ICD-10-CM

## 2022-01-09 DIAGNOSIS — M6281 Muscle weakness (generalized): Secondary | ICD-10-CM

## 2022-01-09 DIAGNOSIS — R293 Abnormal posture: Secondary | ICD-10-CM

## 2022-01-09 DIAGNOSIS — R41840 Attention and concentration deficit: Secondary | ICD-10-CM

## 2022-01-09 DIAGNOSIS — R29818 Other symptoms and signs involving the nervous system: Secondary | ICD-10-CM

## 2022-01-09 DIAGNOSIS — R2681 Unsteadiness on feet: Secondary | ICD-10-CM | POA: Diagnosis not present

## 2022-01-09 DIAGNOSIS — R1310 Dysphagia, unspecified: Secondary | ICD-10-CM

## 2022-01-09 DIAGNOSIS — R41844 Frontal lobe and executive function deficit: Secondary | ICD-10-CM

## 2022-01-09 NOTE — Therapy (Signed)
OUTPATIENT PHYSICAL THERAPY NEURO TREATMENT   Patient Name: Tony [name] MRN: [id number] DOB:1945-04-17, 76 y.o., male Today's Date: 01/09/2022   PCP: Prince Solian, MD  REFERRING PROVIDER: Marcial Pacas, MD    PT End of Session - 01/09/22 1108     Visit Number 13    Number of Visits 17   Plus eval   Date for PT Re-Evaluation 01/30/22    Authorization Type [employer]    PT Start Time 1105   pt received from OT eval   PT Stop Time 1144    PT Time Calculation (min) 39 min    Equipment Utilized During Treatment Gait belt    Activity Tolerance Patient tolerated treatment well    Behavior During Therapy WFL for tasks assessed/performed                 Past Medical History:  Diagnosis Date   Anxiety    Boil of buttock    High cholesterol    Imbalance    Low back pain    Parkinsons    Past Surgical History:  Procedure Laterality Date   CYST REMOVAL LEG  2014   Patient Active Problem List   Diagnosis Date Noted   History of falling 11/29/2021   Unsteadiness on feet 11/29/2021   Excessive sleepiness 11/08/2021   Cognitive impairment 12/25/2020   Anxiety 04/25/2020   Gait abnormality 03/30/2020   Parkinson's disease 03/30/2020   Memory loss 03/30/2020   Parkinsonism 03/30/2020   CELLULITIS AND ABSCESS OF FACE 02/01/2008   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/2006   HYPERLIPIDEMIA 06/30/2006   DIVERTICULOSIS, COLON 06/30/2006   SEBORRHEIC KERATOSIS 06/30/2006   BACK PAIN, LUMBAR 06/30/2006    ONSET DATE: 11/08/2021   REFERRING DIAG: G20 (ICD-10-CM) - Parkinson's disease R41.89 (ICD-10-CM) - Cognitive impairment F41.9 (ICD-10-CM) - Anxiety R26.9 (ICD-10-CM) - Gait abnormality G47.10 (ICD-10-CM) - Excessive sleepiness   THERAPY DIAG:  Unsteadiness on feet  Abnormal posture  Other abnormalities of gait and mobility  Rationale for Evaluation and Treatment Rehabilitation  SUBJECTIVE:                                                                                                                                                                                               SUBJECTIVE STATEMENT: Got his new rollator from home. Really has been liking the walker. No falls or almost falls.    Pt accompanied by: Daughter Santiago Glad and Wife [name]   PERTINENT HISTORY: DaTSCAN in July 2022 showed bilateral decreased radioactive tracing in the putamen and asymmetric decreased radiotracer activity in the head  of the right caudate nucleus His cognitive impairment progress quickly compared to the idiopathic typical Parkinson's patient, he seems to have less optimal response to Sinemet, differentiation diagnosis including idiopathic Parkinson's disease with cognitive impairment versus Lewy body dementia  PAIN:  Are you having pain? No  PRECAUTIONS: Fall  WEIGHT BEARING RESTRICTIONS No  FALLS: Has patient fallen in last 6 months? Yes. Number of falls ~6 or more   PLOF: Independent  PATIENT GOALS "Work on my dexterity"    OBJECTIVE:   DIAGNOSTIC FINDINGS: DaTSCAN in July 2022 showed bilateral decreased radioactive tracing in the putamen and asymmetric decreased radiotracer activity in the head of the right caudate nucleus   COGNITION: Overall cognitive status: Impaired Noted saccadic intrusions of L eye during eval   POSTURE: rounded shoulders, forward head, and flexed trunk    TODAY'S TREATMENT:  NMR Ball toss on air ex with 2nd PT, performed with smaller ball and then bigger ball, pt with difficulty catching and throwing ball, esp with LUE (unable to fully open up hands and grasp), tends to catch it with his body vs. Just his hands, performed x20 reps. Even when cued to try to grab it just with his hands, pt still unable to  On air ex: trunk rotations across body to touch stick notes horizontally x10 reps each side with tracking hands with eyes, then performed superior/laterally and tapping sticky note,  with this direction pt would lose balance anteriorly at times when going to L, needing min A or pt needing to grab onto chair in corner.  15 reps sit <> stands on air ex for immediate standing balance, initially with UE support > none, cues for incr forward lean and tall posture in standing.   Gait Training Gait pattern: step through pattern, decreased stride length, trunk flexed, and narrow BOS Distance walked: 500' outdoors on paved surface  Assistive device utilized: Environmental consultant - 4 wheeled Level of assistance: SBA Comments: Pt brought in his rollator from home that was recently purchased. Reviewed brake management with sit <> stands and making sure brakes are locked before sitting down. Cues to use whole L hand to lock brake rather than just L thumb. Ambulated outdoors with continued focus on staying close to rollator for posture and incr step length under rollator. Practiced going down mild inclines outdoors with practicing holding onto the brakes to slow rollator down.      PATIENT EDUCATION: Education details: Scheduling an additional 2x week for 4 weeks for PT to continue to work on balance, trying to get back to back with OT  Person educated: Patient Education method: Explanation and Handouts Education comprehension: verbalized understanding   HOME EXERCISE PROGRAM: Access Code: [email or web address] URL: [email or web address] Date: 12/03/2021 Prepared by: Mickie Bail Plaster  Exercises - Sit to Stand Without Arm Support  - 1 x daily - 7 x weekly - 3 sets - 10 reps - Step sideways with arms reaching at counter   - 1 x daily - 7 x weekly - 3 sets - 10 reps - Alternating Step Backward with Support  - 1 x daily - 7 x weekly - 3 sets - 10 reps Pt reports he is also performing with wide BOS and reaching up to target     GOALS: Goals reviewed with patient? Yes  SHORT TERM GOALS: Target date: 12/26/2021  Pt will perform initial HEP w/min A from family  for improved strength, balance,  transfers and gait.  Baseline: not established on eval  Goal status: MET  2.  Pt will improve MiniBest to 19/28 for decreased fall risk and improvement with compensatory stepping strategies.   Baseline: 17/28; 17/28 on 11/1 Goal status: NOT MET  3.  Pt will improve gait velocity to at least 2.9 ft/s with LRAD for improved gait efficiency and safety  Baseline: 2.5 ft/s w/hurrycane;  13.9 seconds = 2.35 ft/sec, with no AD on 12/24/21 Goal status: NOT MET  4.  Pt will improve 5 x STS to less than or equal to 15 seconds without UE support to demonstrate improved functional strength and transfer efficiency.   Baseline: 18.5s w/BUE support on reps 1-3 and heavy posterior bracing; 15.1 seconds on 12/24/21 with no UE support on 12/24/21 Goal status: MET  5.  Pt will improve normal TUG to less than or equal to 11 seconds w/LRAD for improved functional mobility and decreased fall risk.  Baseline: 13.72s without AD; 12.4 without AD on 12/24/21 Goal status: NOT MET  6.  Pt and family will be able to teach back and demonstrate fall prevention techniques, including freezing strategies, for reduced fall risk and improved safety at home  Baseline: Pt able to verbalize w/min cues for recall, family not present for session  Goal status: IN PROGRESS   LONG TERM GOALS: Target date: 01/23/2022  Pt will perform final HEP w/min A from family for improved strength, balance, transfers and gait.  Baseline:  Goal status: INITIAL  2.  Pt will improve gait velocity to at least 3.2 ft/s w/LRAD for improved gait efficiency and safety  Baseline: 2.5 ft/s w/hurrycane  Goal status: REVISED  3.  Pt will improve cog TUG to less than or equal to 15 seconds for improved functional mobility and decreased fall risk.  Baseline: 19.04s without AD  Goal status: REVISED  4.  Pt will improve MiniBest to 19/28 for decreased fall risk and improvement with compensatory stepping strategies.   Baseline: 17/28 on  11/1 Goal status: REVISED  5.  Pt and family will verbalize understanding of local PD community resources, including fitness post DC.   Baseline:  Goal status: INITIAL   ASSESSMENT:  CLINICAL IMPRESSION: Pt brought in his rollator from home that was recently ordered. Pt reports feeling good with this and much more stable. Worked on gait outdoors on paved surfaces with focus on staying close to rollator and using brakes to slow down speed when going down inclines. Pt still needing reminder cues for proper brake management for sit <> stand transfers. Continued to work on balance strategies on unlevel surfaces. Pt has difficulty catching ball with LUE (tends to only catch with body and RUE) and then tossing back to therapist. Will continue per POC.  OBJECTIVE IMPAIRMENTS Abnormal gait, decreased balance, decreased cognition, decreased coordination, decreased knowledge of condition, decreased knowledge of use of DME, decreased mobility, difficulty walking, decreased safety awareness, impaired perceived functional ability, impaired vision/preception, and improper body mechanics.   ACTIVITY LIMITATIONS carrying, lifting, bending, standing, squatting, stairs, transfers, dressing, reach over head, hygiene/grooming, locomotion level, and caring for others  PARTICIPATION LIMITATIONS: meal prep, cleaning, laundry, medication management, personal finances, interpersonal relationship, driving, shopping, community activity, occupation, and yard work  PERSONAL FACTORS Age, Education, Fitness, Past/current experiences, Time since onset of injury/illness/exacerbation, and 1 comorbidity: Lewy Body Dementia  are also affecting patient's functional outcome.   REHAB POTENTIAL: Fair due to poor prognosis of diagnosis   CLINICAL DECISION MAKING: Evolving/moderate complexity  EVALUATION COMPLEXITY: Moderate  PLAN: PT FREQUENCY: 2x/week  PT DURATION: 8 weeks  PLANNED  INTERVENTIONS: Therapeutic exercises,  Therapeutic activity, Neuromuscular re-education, Balance training, Gait training, Patient/Family education, Self Care, Joint mobilization, Stair training, Vestibular training, Canalith repositioning, DME instructions, Aquatic Therapy, Dry Needling, Electrical stimulation, Cryotherapy, Moist heat, Manual therapy, and Re-evaluation  PLAN FOR NEXT SESSION:  Stepping strategies. Add to HEP for deficits highlighted by outcome measures (add something possibly for forward stepping or SLS). Pt requesting to walk on treadmill and do boxing - very active. Slam balls, cone taps, lateral weight shifting to L side, LE coordination, depth perception, VOR? Floor transfers.   Arliss Journey, PT, DPT 01/09/2022, 12:12 PM

## 2022-01-09 NOTE — Patient Instructions (Addendum)
You decided that the best time to do your speech practice is mid-morning, after you get ready for the day  around 10:30 on days you don't have something scheduled, or earlier before cycling class If it doesn't fit into that morning routine for whatever reason, make a conscious effort to consider when you can schedule it in later in the day  The more you practice, the easier it'll be!!   Your goal is to do a new lesson every day  Thursday  Lesson 3   Wednesday  Lesson 4   Thursday  Lesson 5   Friday  Lesson 6   Saturday  Lesson 7   Monday Lesson 8   Tuesday Lesson 9

## 2022-01-10 DIAGNOSIS — H547 Unspecified visual loss: Secondary | ICD-10-CM | POA: Diagnosis not present

## 2022-01-10 DIAGNOSIS — G20B2 Parkinson's disease with dyskinesia, with fluctuations: Secondary | ICD-10-CM | POA: Diagnosis not present

## 2022-01-10 DIAGNOSIS — M7989 Other specified soft tissue disorders: Secondary | ICD-10-CM | POA: Diagnosis not present

## 2022-01-10 DIAGNOSIS — M25472 Effusion, left ankle: Secondary | ICD-10-CM | POA: Diagnosis not present

## 2022-01-10 DIAGNOSIS — R296 Repeated falls: Secondary | ICD-10-CM | POA: Diagnosis not present

## 2022-01-14 ENCOUNTER — Ambulatory Visit: Payer: PPO | Admitting: Physical Therapy

## 2022-01-14 DIAGNOSIS — R2681 Unsteadiness on feet: Secondary | ICD-10-CM | POA: Diagnosis not present

## 2022-01-14 DIAGNOSIS — M6281 Muscle weakness (generalized): Secondary | ICD-10-CM

## 2022-01-14 DIAGNOSIS — R278 Other lack of coordination: Secondary | ICD-10-CM

## 2022-01-14 NOTE — Therapy (Signed)
OUTPATIENT PHYSICAL THERAPY NEURO TREATMENT   Patient Name: Huriel Matt MRN: [id number] DOB:10-24-1945, 76 y.o., male Today's Date: 01/14/2022   PCP: Prince Solian, MD  REFERRING PROVIDER: Marcial Pacas, MD    PT End of Session - 01/14/22 1105     Visit Number 14    Number of Visits 17   Plus eval   Date for PT Re-Evaluation 01/30/22    Authorization Type [employer]    PT Start Time 1102    PT Stop Time 1145    PT Time Calculation (min) 43 min    Equipment Utilized During Treatment Gait belt    Activity Tolerance Patient tolerated treatment well    Behavior During Therapy WFL for tasks assessed/performed                  Past Medical History:  Diagnosis Date   Anxiety    Boil of buttock    High cholesterol    Imbalance    Low back pain    Parkinsons    Past Surgical History:  Procedure Laterality Date   CYST REMOVAL LEG  2014   Patient Active Problem List   Diagnosis Date Noted   History of falling 11/29/2021   Unsteadiness on feet 11/29/2021   Excessive sleepiness 11/08/2021   Cognitive impairment 12/25/2020   Anxiety 04/25/2020   Gait abnormality 03/30/2020   Parkinson's disease 03/30/2020   Memory loss 03/30/2020   Parkinsonism 03/30/2020   CELLULITIS AND ABSCESS OF FACE 02/01/2008   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/2006   HYPERLIPIDEMIA 06/30/2006   DIVERTICULOSIS, COLON 06/30/2006   SEBORRHEIC KERATOSIS 06/30/2006   BACK PAIN, LUMBAR 06/30/2006    ONSET DATE: 11/08/2021   REFERRING DIAG: G20 (ICD-10-CM) - Parkinson's disease R41.89 (ICD-10-CM) - Cognitive impairment F41.9 (ICD-10-CM) - Anxiety R26.9 (ICD-10-CM) - Gait abnormality G47.10 (ICD-10-CM) - Excessive sleepiness   THERAPY DIAG:  Other lack of coordination  Unsteadiness on feet  Muscle weakness (generalized)  Rationale for Evaluation and Treatment Rehabilitation  SUBJECTIVE:                                                                                                                                                                                               SUBJECTIVE STATEMENT: Pt ambulated into clinic w/rollator, thinks it is too short for him. No new falls or near misses. Using the rollator at all times now. L hand is still swollen but is getting better.    Pt accompanied by: Daughter Santiago Glad and Wife [name]   PERTINENT HISTORY: DaTSCAN in July 2022 showed bilateral decreased radioactive tracing  in the putamen and asymmetric decreased radiotracer activity in the head of the right caudate nucleus His cognitive impairment progress quickly compared to the idiopathic typical Parkinson's patient, he seems to have less optimal response to Sinemet, differentiation diagnosis including idiopathic Parkinson's disease with cognitive impairment versus Lewy body dementia  PAIN:  Are you having pain? No  PRECAUTIONS: Fall  WEIGHT BEARING RESTRICTIONS No  FALLS: Has patient fallen in last 6 months? Yes. Number of falls ~6 or more   PLOF: Independent  PATIENT GOALS "Work on my dexterity"    OBJECTIVE:   DIAGNOSTIC FINDINGS: DaTSCAN in July 2022 showed bilateral decreased radioactive tracing in the putamen and asymmetric decreased radiotracer activity in the head of the right caudate nucleus   COGNITION: Overall cognitive status: Impaired Noted saccadic intrusions of L eye during eval   POSTURE: rounded shoulders, forward head, and flexed trunk    TODAY'S TREATMENT:  Ther Ex  SciFit multi-peaks level 6 for 3.5 minutes using BUE/BLEs for neural priming for reciprocal movement, dynamic cardiovascular warmup and increased amplitude of stepping. 3.5 minutes in, pt reported 5/10 bilateral knee pain and was unable to continue due to pain. Pt reports he rode the recumbent bike for 20 minutes yesterday and went shopping with wife, unsure if that is why he is having pain.   NMR [facility] weaving w/rollator for proper turn and  AD management, x4 reps weaving between 5 cones. Min cues to maintain close distance to rollator and keep feet in middle, as pt tends to step outside of rollator and trip over the back L wheel, requiring CGA to recover. Pt reports weaving was "easy" when keeping feet in middle of walker.  Cornhole using LUE only to grab/throw beanbag for improved motor planning, dynamic balance and functional use of LUE. Pt very challenged by task and required constraint therapy to RUE to prevent him from throwing w/R hand despite max verbal cues. Pt unable to pronate L hand to grab beanbag despite cues. Pt also unable to release bean bag during throw, frequently releasing at top of throw in which it would go backwards. Progressed to adding 2# ankle weight to LUE for improved proprioception and standing on rockerboard in A/P direction to facilitate use of rocking mechanism to throw beanbag. Pt demonstrated no improvement w/amplitude of arm swing or ability to throw beanbag w/added ankle weight, so removed after 5 throws. Min A required throughout as pt leaning posteriorly throughout despite cues.    PATIENT EDUCATION: Education details: Provided updated calendar of PT/OT/SLP appointments, continue HEP and prioritize use of LUE at home Person educated: Patient Education method: Explanation and Handouts Education comprehension: verbalized understanding   HOME EXERCISE PROGRAM: Access Code: [email or web address] URL: [email or web address] Date: 12/03/2021 Prepared by: Mickie Bail Quran Vasco  Exercises - Sit to Stand Without Arm Support  - 1 x daily - 7 x weekly - 3 sets - 10 reps - Step sideways with arms reaching at counter   - 1 x daily - 7 x weekly - 3 sets - 10 reps - Alternating Step Backward with Support  - 1 x daily - 7 x weekly - 3 sets - 10 reps Pt reports he is also performing with wide BOS and reaching up to target     GOALS: Goals reviewed with patient? Yes  SHORT TERM GOALS: Target date: 12/26/2021  Pt  will perform initial HEP w/min A from family  for improved strength, balance, transfers and gait.  Baseline: not established on eval  Goal status: MET  2.  Pt will improve MiniBest to 19/28 for decreased fall risk and improvement with compensatory stepping strategies.   Baseline: 17/28; 17/28 on 11/1 Goal status: NOT MET  3.  Pt will improve gait velocity to at least 2.9 ft/s with LRAD for improved gait efficiency and safety  Baseline: 2.5 ft/s w/hurrycane;  13.9 seconds = 2.35 ft/sec, with no AD on 12/24/21 Goal status: NOT MET  4.  Pt will improve 5 x STS to less than or equal to 15 seconds without UE support to demonstrate improved functional strength and transfer efficiency.   Baseline: 18.5s w/BUE support on reps 1-3 and heavy posterior bracing; 15.1 seconds on 12/24/21 with no UE support on 12/24/21 Goal status: MET  5.  Pt will improve normal TUG to less than or equal to 11 seconds w/LRAD for improved functional mobility and decreased fall risk.  Baseline: 13.72s without AD; 12.4 without AD on 12/24/21 Goal status: NOT MET  6.  Pt and family will be able to teach back and demonstrate fall prevention techniques, including freezing strategies, for reduced fall risk and improved safety at home  Baseline: Pt able to verbalize w/min cues for recall, family not present for session  Goal status: IN PROGRESS   LONG TERM GOALS: Target date: 01/23/2022  Pt will perform final HEP w/min A from family for improved strength, balance, transfers and gait.  Baseline:  Goal status: INITIAL  2.  Pt will improve gait velocity to at least 3.2 ft/s w/LRAD for improved gait efficiency and safety  Baseline: 2.5 ft/s w/hurrycane  Goal status: REVISED  3.  Pt will improve cog TUG to less than or equal to 15 seconds for improved functional mobility and decreased fall risk.  Baseline: 19.04s without AD  Goal status: REVISED  4.  Pt will improve MiniBest to 19/28 for decreased fall risk and  improvement with compensatory stepping strategies.   Baseline: 17/28 on 11/1 Goal status: REVISED  5.  Pt and family will verbalize understanding of local PD community resources, including fitness post DC.   Baseline:  Goal status: INITIAL   ASSESSMENT:  CLINICAL IMPRESSION: Emphasis of skilled PT session on motor planning and functional use of LUE. Pt unable to tolerate scifit today due to new onset knee pain. Pt has significant difficulty w/lateral reach and grasp w/LUE, requiring constraint of RUE for pt to use L hand. Pt unable to motor plan w/LUE despite max multimodal cues and had poor righting reactions when on rocker board in posterior direction. Continue POC.   OBJECTIVE IMPAIRMENTS Abnormal gait, decreased balance, decreased cognition, decreased coordination, decreased knowledge of condition, decreased knowledge of use of DME, decreased mobility, difficulty walking, decreased safety awareness, impaired perceived functional ability, impaired vision/preception, and improper body mechanics.   ACTIVITY LIMITATIONS carrying, lifting, bending, standing, squatting, stairs, transfers, dressing, reach over head, hygiene/grooming, locomotion level, and caring for others  PARTICIPATION LIMITATIONS: meal prep, cleaning, laundry, medication management, personal finances, interpersonal relationship, driving, shopping, community activity, occupation, and yard work  PERSONAL FACTORS Age, Education, Fitness, Past/current experiences, Time since onset of injury/illness/exacerbation, and 1 comorbidity: Lewy Body Dementia  are also affecting patient's functional outcome.   REHAB POTENTIAL: Fair due to poor prognosis of diagnosis   CLINICAL DECISION MAKING: Evolving/moderate complexity  EVALUATION COMPLEXITY: Moderate  PLAN: PT FREQUENCY: 2x/week  PT DURATION: 8 weeks  PLANNED INTERVENTIONS: Therapeutic exercises, Therapeutic activity, Neuromuscular re-education, Balance training, Gait  training, Patient/Family education, Self Care, Joint mobilization, Stair training, Vestibular training, Canalith repositioning, DME  instructions, Aquatic Therapy, Dry Needling, Electrical stimulation, Cryotherapy, Moist heat, Manual therapy, and Re-evaluation  PLAN FOR NEXT SESSION:  Stepping strategies. Add to HEP for deficits highlighted by outcome measures (add something possibly for forward stepping or SLS). Pt requesting to walk on treadmill and do boxing - very active. Slam balls, cone taps, lateral weight shifting to L side, LE coordination, depth perception, VOR? Floor transfers.   Cruzita Lederer Phillis Thackeray, PT, DPT 01/14/2022, 11:48 AM

## 2022-01-16 ENCOUNTER — Ambulatory Visit: Payer: PPO | Admitting: Physical Therapy

## 2022-01-16 ENCOUNTER — Ambulatory Visit: Payer: PPO | Admitting: Speech Pathology

## 2022-01-16 DIAGNOSIS — R1310 Dysphagia, unspecified: Secondary | ICD-10-CM

## 2022-01-16 DIAGNOSIS — M6281 Muscle weakness (generalized): Secondary | ICD-10-CM

## 2022-01-16 DIAGNOSIS — R471 Dysarthria and anarthria: Secondary | ICD-10-CM

## 2022-01-16 DIAGNOSIS — R2689 Other abnormalities of gait and mobility: Secondary | ICD-10-CM

## 2022-01-16 DIAGNOSIS — R2681 Unsteadiness on feet: Secondary | ICD-10-CM | POA: Diagnosis not present

## 2022-01-16 DIAGNOSIS — R41841 Cognitive communication deficit: Secondary | ICD-10-CM

## 2022-01-16 NOTE — Therapy (Signed)
OUTPATIENT SPEECH LANGUAGE PATHOLOGY TREATMENT NOTE   Patient Name: Tony [name] MRN: [id number] DOB:1945-12-19, 76 y.o., male Today's Date: 01/16/2022  PCP: Prince Solian, MD REFERRING PROVIDER: Marcial Pacas, MD    End of Session - 01/16/22 1008     Visit Number 5    Number of Visits 17    Date for SLP Re-Evaluation 02/11/22    Authorization Type [employer]    SLP Start Time 1015    SLP Stop Time  1100    SLP Time Calculation (min) 45 min    Activity Tolerance Patient tolerated treatment well                Past Medical History:  Diagnosis Date   Anxiety    Boil of buttock    High cholesterol    Imbalance    Low back pain    Parkinsons    Past Surgical History:  Procedure Laterality Date   CYST REMOVAL LEG  2014   Patient Active Problem List   Diagnosis Date Noted   History of falling 11/29/2021   Unsteadiness on feet 11/29/2021   Excessive sleepiness 11/08/2021   Cognitive impairment 12/25/2020   Anxiety 04/25/2020   Gait abnormality 03/30/2020   Parkinson's disease 03/30/2020   Memory loss 03/30/2020   Parkinsonism 03/30/2020   CELLULITIS AND ABSCESS OF FACE 02/01/2008   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/2006   HYPERLIPIDEMIA 06/30/2006   DIVERTICULOSIS, COLON 06/30/2006   SEBORRHEIC KERATOSIS 06/30/2006   BACK PAIN, LUMBAR 06/30/2006    ONSET DATE: 11/29/2021 referral  REFERRING DIAG: R26.81 (ICD-10-CM) - Unsteadiness on feet Z91.81 (ICD-10-CM) - History of falling G20.C (ICD-10-CM) - Parkinsonism, unspecified Parkinsonism type   THERAPY DIAG:  Dysphagia, unspecified type  Cognitive communication deficit  Dysarthria and anarthria  Rationale for Evaluation and Treatment Rehabilitation  SUBJECTIVE:   SUBJECTIVE STATEMENT: "Pretty good, I have to be careful when I'm at the table"   PAIN:  Are you having pain? No   OBJECTIVE:   TODAY'S TREATMENT:          01-16-22: Pt reports improved saliva  management with use of previously targeted strategies and knowledge of mechanism impacting need for intentional swallowing. Target improving vocal quality and increasing intensity through progressively difficulty speech tasks using Speak Out! program, lesson 7. ST leads pt through exercises providing occasional model prior to pt execution. occasional mod-A required to achieve target dB this date. Averages this date: loud "ah" 90 dB; counting 76 dB; reading 75 dB; cognitive speech task 72 dB. Conversational sample of approx 5 minutes, pt averages 71 dB with occasional min-A. Pt rreports to having hard time reading, tells SLP "I ave trouble with the words" tells SLP everything seems jumbled. Wants to be able to read his mail. SLP asks pt to bring piece of mail to next session to trial comprehension/attention strategies.    01-09-22: SLP assisted pt in determining best routine to facilitate daily HEP practice, see handout. Education on reasoning for daily practice. PROM never returned so addressed this date, see above. During administration generated strategies to A pt in overcoming primary concerns: asking for more time, faciliating one on one conversations, sitting next to preferred communication partner during group dinners. Demonstrated all speak out exercises to aid in accurate completion during HEP. Pt averages as follows during brief practice with rare min-A: 83 dB sustained AH, 78 dB reading, 72 dB cognitive exercise.   12/26/21: Reviewed general swallow precautions and strategies for drool management. He  has noticed reduced drool since prior session by being mindful to swallow frequently and before speaking. Today he required verbal cues 2x to swallow when speaking due to saliva observed in corners of his mouth. They go out to eat every Wednesday evening with a large group. We generated a strategy of mentally preparing on the way to dinner to swallow with intent to aid carryover of swallow precautions in a  distracting environment. Also generated strategy of his wife providing discrete hand signals to remind him to swallow or get louder when out with friends. Initiated HEP for dysarthria. He ordered Speak Out workbook yesterday.  Targeted volume and intelligibility using Speak Out! Lesson 1. Pt required frequent mod  verbal cues, modeling, for volume and breath support.   Pt averages the following volume levels:  Sustained AH: 85 dB  Counting:85  Reading (phrases): 85  Cognitive Exercise: 72 Required frequent max  verbal  & visual cues, modeling for carryover of intent /volume answering simple questions following structured practice.   PATIENT EDUCATION: Education details: see above Person educated: Patient and Spouse Education method: Customer service manager Education comprehension: verbalized understanding, returned demonstration, and needs further education  HOME EXERCISE PROGRAM: Speak Out   GOALS: Goals reviewed with patient? Yes  SHORT TERM GOALS: Target date: 01/14/2022  Pt will complete clinical swallow evaluation (CSE) to assess need for objective swallow evaluation  Baseline: 12/24/2021 Goal status: MET  2.  Pt will complete instrumental swallow evaluation, if indicated per CSE to objectively assess swallow function and efficacy Baseline:  Goal status: deferred d/t CSE results  3.  Pt will demonstrate communication strategies to aid in attention, processing, and coherent discourse in 15 minute conversation with occasional min-A Baseline:  Goal status: MET  4.  Pt will meet dB targets for warm up and counting Speak Out exercises with rare min-A over 1 week period Baseline:  Goal status: INITIAL  5.  Pt's care partner will teach back strategies and compensations to aid in pt's improved safety whilst eating meals at home over 1 week period Baseline:  Goal status: deferred   LONG TERM GOALS: Target date: 02/11/22  Pt will report daily HEP adherence for  dysphagia and dysarthria with min-A from care partner over 1 week period Baseline:  Goal status: INITIAL  2.  Pt's average volume will exceed 68 dB in answering simple, 10 minute conversation, such as answering personally relevant questions with occasional min-A over 2 sessions Baseline:  Goal status: INITIAL  3.  Pt will demonstrate saliva management techniques with compensations PRN and rare verbal cues over 30 minutes Baseline:  Goal status: INITIAL  4.  Pt's care partner will report carryover of strategies and compensations to aid in improved safety whilst eating meals at home and subjective report of reduced instances of coughing during meal times over 1 week period Baseline:  Goal status: INITIAL  5.  Pt's volume will average greater than 70 dB during Speak Out reading and cognitive exercises with occasional min-A over 2 sessions  Baseline:  Goal status: INITIAL   ASSESSMENT:  CLINICAL IMPRESSION: Patient is a 76 y.o. 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 Pt presents with mild dysarthria, cognitive impairment, and dysphagia. 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 Will plan to complete clinical swallow evaluation during first therapy session to assess swallow and potential need for objective instrumental  study. Cognition impairments in areas of attention, memory, executive functioning resulting in needing A for finance, schedule, and medication management. Difficulties processing or speaking in conversations with more than 1 partner. Skilled ST is indicated.   OBJECTIVE IMPAIRMENTS: Objective impairments include attention, memory, executive functioning, expressive language, dysarthria, and dysphagia. These impairments are limiting patient from managing medications, managing  appointments, managing finances, household responsibilities, ADLs/IADLs, effectively communicating at home and in community, and safety when swallowing.Factors affecting potential to achieve goals and functional outcome are ability to learn/carryover information and medical prognosis.. Patient will benefit from skilled SLP services to address above impairments and improve overall function.  REHAB POTENTIAL: Fair (degenerative dx)  PLAN:  SLP FREQUENCY: 2x/week  SLP DURATION: 8 weeks  PLANNED INTERVENTIONS: Aspiration precaution training, Pharyngeal strengthening exercises, Diet toleration management , Language facilitation, Environmental controls, Trials of upgraded texture/liquids, Cueing hierachy, Cognitive reorganization, Internal/external aids, Functional tasks, SLP instruction and feedback, Compensatory strategies, Patient/family education, and Re-evaluation    Su Monks, CCC-SLP 01/16/2022, 10:15 AM

## 2022-01-16 NOTE — Therapy (Addendum)
OUTPATIENT PHYSICAL THERAPY NEURO TREATMENT   Patient Name: Tony [name] MRN: [id number] DOB:01/28/1946, 76 y.o., male Today's Date: 01/16/2022   PCP: Prince Solian, MD  REFERRING PROVIDER: Marcial Pacas, MD    PT End of Session - 01/16/22 1110     Visit Number 15    Number of Visits 17   Plus eval   Date for PT Re-Evaluation 01/30/22    Authorization Type [employer]    PT Start Time 1101    PT Stop Time 1147    PT Time Calculation (min) 46 min    Equipment Utilized During Treatment Gait belt    Activity Tolerance Patient tolerated treatment well    Behavior During Therapy WFL for tasks assessed/performed                   Past Medical History:  Diagnosis Date   Anxiety    Boil of buttock    High cholesterol    Imbalance    Low back pain    Parkinsons    Past Surgical History:  Procedure Laterality Date   CYST REMOVAL LEG  2014   Patient Active Problem List   Diagnosis Date Noted   History of falling 11/29/2021   Unsteadiness on feet 11/29/2021   Excessive sleepiness 11/08/2021   Cognitive impairment 12/25/2020   Anxiety 04/25/2020   Gait abnormality 03/30/2020   Parkinson's disease 03/30/2020   Memory loss 03/30/2020   Parkinsonism 03/30/2020   CELLULITIS AND ABSCESS OF FACE 02/01/2008   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/2006   HYPERLIPIDEMIA 06/30/2006   DIVERTICULOSIS, COLON 06/30/2006   SEBORRHEIC KERATOSIS 06/30/2006   BACK PAIN, LUMBAR 06/30/2006    ONSET DATE: 11/08/2021   REFERRING DIAG: G20 (ICD-10-CM) - Parkinson's disease R41.89 (ICD-10-CM) - Cognitive impairment F41.9 (ICD-10-CM) - Anxiety R26.9 (ICD-10-CM) - Gait abnormality G47.10 (ICD-10-CM) - Excessive sleepiness   THERAPY DIAG:  Unsteadiness on feet  Muscle weakness (generalized)  Other abnormalities of gait and mobility  Rationale for Evaluation and Treatment Rehabilitation  SUBJECTIVE:                                                                                                                                                                                               SUBJECTIVE STATEMENT: Pt reports he is doing okay. Continues to have swelling in L hand and LLE, not properly elevating them at home. No falls.    Pt accompanied by: Daughter [name]   PERTINENT HISTORY: DaTSCAN in July 2022 showed bilateral decreased radioactive tracing in the putamen and asymmetric decreased radiotracer activity in the head  of the right caudate nucleus His cognitive impairment progress quickly compared to the idiopathic typical Parkinson's patient, he seems to have less optimal response to Sinemet, differentiation diagnosis including idiopathic Parkinson's disease with cognitive impairment versus Lewy body dementia  PAIN:  Are you having pain? No  PRECAUTIONS: Fall  WEIGHT BEARING RESTRICTIONS No  FALLS: Has patient fallen in last 6 months? Yes. Number of falls ~6 or more   PLOF: Independent  PATIENT GOALS "Work on my dexterity"    OBJECTIVE:   DIAGNOSTIC FINDINGS: DaTSCAN in July 2022 showed bilateral decreased radioactive tracing in the putamen and asymmetric decreased radiotracer activity in the head of the right caudate nucleus   COGNITION: Overall cognitive status: Impaired Noted saccadic intrusions of L eye during eval   POSTURE: rounded shoulders, forward head, and flexed trunk    TODAY'S TREATMENT:  Ther Act   Discussed how to obtain new rollator and return the current one, as it is not able to adjust to pt's height. Provided handout of correct rollator to purchase, pt and daughter verbalized understanding.  Pt reports he hurt his L shoulder in college and is fearful he tore it again with his fall a few weeks prior. Assessed pt's left shoulder but no subluxation noted. Pt does have painful arc, but he reports this is baseline for him.   NMR Sit <>stands while throwing 5.5lb ball to mat target, x10  reps, for improved LUE coordination, anterior weight shifting and dual-tasking. Pt required min cues for proper positioning of ball in L hand to assist w/throw, as he relies heavily on RUE only. Progressed to adding cog-dual task (naming animals), which slowed pt down but did not cause LOB. Pt unable to name animals on final 4 throws, requiring max cues to think of a word. CGA throughout.  Resisted gait w/random posterior perturbations, x345' in fwd direction for improved stepping strategies, reactive balance and set-switching. Pt required max cues to maintain amplitude and velocity of gait, as he frequently slowed down or became distracted. Noted downward gaze throughout and LUE maintained in extension and IR. No LOB noted, CGA throughout.  Retro gait w/fast switch to fwd gait when pt feels posterior perturbation, x15 reps. Pt unable to shift direction of gait quickly and frequently did not feel posterior perturbation, requiring max cues to sustain attention to task and take large, forceful step forward when perturbation is felt. CGA throughout, no LOB noted.    PATIENT EDUCATION: Education details: Information on swapping out rollators, continue HEP  Person educated: Patient Education method: Theatre stage manager Education comprehension: verbalized understanding   HOME EXERCISE PROGRAM: Access Code: [email or web address] URL: [email or web address] Date: 12/03/2021 Prepared by: Mickie Bail Dakiyah Heinke  Exercises - Sit to Stand Without Arm Support  - 1 x daily - 7 x weekly - 3 sets - 10 reps - Step sideways with arms reaching at counter   - 1 x daily - 7 x weekly - 3 sets - 10 reps - Alternating Step Backward with Support  - 1 x daily - 7 x weekly - 3 sets - 10 reps Pt reports he is also performing with wide BOS and reaching up to target     GOALS: Goals reviewed with patient? Yes  SHORT TERM GOALS: Target date: 12/26/2021  Pt will perform initial HEP w/min A from family  for improved  strength, balance, transfers and gait.  Baseline: not established on eval  Goal status: MET  2.  Pt will improve MiniBest to 19/28 for decreased fall risk  and improvement with compensatory stepping strategies.   Baseline: 17/28; 17/28 on 11/1 Goal status: NOT MET  3.  Pt will improve gait velocity to at least 2.9 ft/s with LRAD for improved gait efficiency and safety  Baseline: 2.5 ft/s w/hurrycane;  13.9 seconds = 2.35 ft/sec, with no AD on 12/24/21 Goal status: NOT MET  4.  Pt will improve 5 x STS to less than or equal to 15 seconds without UE support to demonstrate improved functional strength and transfer efficiency.   Baseline: 18.5s w/BUE support on reps 1-3 and heavy posterior bracing; 15.1 seconds on 12/24/21 with no UE support on 12/24/21 Goal status: MET  5.  Pt will improve normal TUG to less than or equal to 11 seconds w/LRAD for improved functional mobility and decreased fall risk.  Baseline: 13.72s without AD; 12.4 without AD on 12/24/21 Goal status: NOT MET  6.  Pt and family will be able to teach back and demonstrate fall prevention techniques, including freezing strategies, for reduced fall risk and improved safety at home  Baseline: Pt able to verbalize w/min cues for recall, family not present for session  Goal status: IN PROGRESS   LONG TERM GOALS: Target date: 01/23/2022  Pt will perform final HEP w/min A from family for improved strength, balance, transfers and gait.  Baseline:  Goal status: INITIAL  2.  Pt will improve gait velocity to at least 3.2 ft/s w/LRAD for improved gait efficiency and safety  Baseline: 2.5 ft/s w/hurrycane  Goal status: REVISED  3.  Pt will improve cog TUG to less than or equal to 15 seconds for improved functional mobility and decreased fall risk.  Baseline: 19.04s without AD  Goal status: REVISED  4.  Pt will improve MiniBest to 19/28 for decreased fall risk and improvement with compensatory stepping strategies.    Baseline: 17/28 on 11/1 Goal status: REVISED  5.  Pt and family will verbalize understanding of local PD community resources, including fitness post DC.   Baseline:  Goal status: INITIAL   ASSESSMENT:  CLINICAL IMPRESSION: Emphasis of skilled PT session on reactive balance strategies and set-switching. Provided pt and daughter with information on how to return current rollator to obtain new one that will adjust to his height. 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 Continue POC.   OBJECTIVE IMPAIRMENTS Abnormal gait, decreased balance, decreased cognition, decreased coordination, decreased knowledge of condition, decreased knowledge of use of DME, decreased mobility, difficulty walking, decreased safety awareness, impaired perceived functional ability, impaired vision/preception, and improper body mechanics.   ACTIVITY LIMITATIONS carrying, lifting, bending, standing, squatting, stairs, transfers, dressing, reach over head, hygiene/grooming, locomotion level, and caring for others  PARTICIPATION LIMITATIONS: meal prep, cleaning, laundry, medication management, personal finances, interpersonal relationship, driving, shopping, community activity, occupation, and yard work  PERSONAL FACTORS Age, Education, Fitness, Past/current experiences, Time since onset of injury/illness/exacerbation, and 1 comorbidity: Lewy Body Dementia  are also affecting patient's functional outcome.   REHAB POTENTIAL: Fair due to poor prognosis of diagnosis   CLINICAL DECISION MAKING: Evolving/moderate complexity  EVALUATION COMPLEXITY: Moderate  PLAN: PT FREQUENCY: 2x/week  PT DURATION: 8 weeks  PLANNED INTERVENTIONS: Therapeutic exercises, Therapeutic activity, Neuromuscular re-education, Balance training, Gait training, Patient/Family education, Self Care, Joint mobilization,  Stair training, Vestibular training, Canalith repositioning, DME instructions, Aquatic Therapy, Dry Needling, Electrical stimulation, Cryotherapy, Moist heat, Manual therapy, and Re-evaluation  PLAN FOR NEXT SESSION:  Goal assessment, recert? Stepping strategies. Add to HEP for deficits highlighted by outcome measures (add something possibly for forward stepping or SLS). Pt requesting to walk on treadmill and do boxing - very active. Slam balls, cone taps, lateral weight shifting to L side, LE coordination, depth perception, VOR? Floor transfers.   Cruzita Lederer Kensly Bowmer, PT, DPT 01/16/2022, 12:59 PM

## 2022-01-23 ENCOUNTER — Encounter: Payer: Self-pay | Admitting: Speech Pathology

## 2022-01-23 ENCOUNTER — Ambulatory Visit: Payer: PPO | Admitting: Speech Pathology

## 2022-01-23 DIAGNOSIS — R41841 Cognitive communication deficit: Secondary | ICD-10-CM

## 2022-01-23 DIAGNOSIS — R2681 Unsteadiness on feet: Secondary | ICD-10-CM | POA: Diagnosis not present

## 2022-01-23 DIAGNOSIS — R471 Dysarthria and anarthria: Secondary | ICD-10-CM

## 2022-01-23 DIAGNOSIS — R1310 Dysphagia, unspecified: Secondary | ICD-10-CM

## 2022-01-23 NOTE — Patient Instructions (Addendum)
   Let family know to be mindful to include you in conversations and wait give you time to respond  Remember with Parkinson's, speech can become flat with less expression and your face can loose expression as well which others may interpret as lack of interest or that you don't want to talk  You may find you (or Tony [name]) it is helpful to remind others that you do want to visit even though your face or speech may be flat  It is important to participate in conversation to keep your speech and word finding going - use it or loose it

## 2022-01-23 NOTE — Therapy (Signed)
OUTPATIENT SPEECH LANGUAGE PATHOLOGY TREATMENT NOTE   Patient Name: Tony [name] MRN: [id number] DOB:[birth year], 76 y.o., male Today's Date: 01/23/2022  PCP: Tony Solian, MD REFERRING PROVIDER: Marcial Pacas, MD    End of Session - 01/23/22 1018     Visit Number 6    Number of Visits 17    Date for SLP Re-Evaluation 02/11/22    Authorization Type [employer]    SLP Start Time 4166    SLP Stop Time  1100    SLP Time Calculation (min) 45 min    Activity Tolerance Patient tolerated treatment well                Past Medical History:  Diagnosis Date   Anxiety    Boil of buttock    High cholesterol    Imbalance    Low back pain    Parkinsons    Past Surgical History:  Procedure Laterality Date   CYST REMOVAL LEG  2014   Patient Active Problem List   Diagnosis Date Noted   History of falling 11/29/2021   Unsteadiness on feet 11/29/2021   Excessive sleepiness 11/08/2021   Cognitive impairment 12/25/2020   Anxiety 04/25/2020   Gait abnormality 03/30/2020   Parkinson's disease 03/30/2020   Memory loss 03/30/2020   Parkinsonism 03/30/2020   CELLULITIS AND ABSCESS OF FACE 02/01/2008   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/2006   HYPERLIPIDEMIA 06/30/2006   DIVERTICULOSIS, COLON 06/30/2006   SEBORRHEIC KERATOSIS 06/30/2006   BACK PAIN, LUMBAR 06/30/2006    ONSET DATE: 11/29/2021 referral  REFERRING DIAG: R26.81 (ICD-10-CM) - Unsteadiness on feet Z91.81 (ICD-10-CM) - History of falling G20.C (ICD-10-CM) - Parkinsonism, unspecified Parkinsonism type   THERAPY DIAG:  Dysarthria and anarthria  Cognitive communication deficit  Dysphagia, unspecified type  Rationale for Evaluation and Treatment Rehabilitation  SUBJECTIVE:   SUBJECTIVE STATEMENT: "I have a question about Lesson 9"   PAIN:  Are you having pain? No   OBJECTIVE:   TODAY'S TREATMENT:          01-23-22: Tony [name] did not understand cognitive exercise Lesson 9 -  name 3-5 items in a category - After this was explained, he named 5 items in 4 categories with extended time and occasional semantic cues. He stated "I really struggle with this" Targeted volume using Speak Out! Loud Ah 90dB, Reading 75dB, cognitive exercise (resumed categorization after voice warm ups) 70dB with usual verbal and visual cues for volume. Trained in strategies to maximize his participation in family conversations including family education and awareness of flat affect and monotone speech.  See patient instructions.   01-16-22: Pt reports improved saliva management with use of previously targeted strategies and knowledge of mechanism impacting need for intentional swallowing. Target improving vocal quality and increasing intensity through progressively difficulty speech tasks using Speak Out! program, lesson 7. ST leads pt through exercises providing occasional model prior to pt execution. occasional mod-A required to achieve target dB this date. Averages this date: loud "ah" 90 dB; counting 76 dB; reading 75 dB; cognitive speech task 72 dB. Conversational sample of approx 5 minutes, pt averages 71 dB with occasional min-A. Pt rreports to having hard time reading, tells SLP "I ave trouble with the words" tells SLP everything seems jumbled. Wants to be able to read his mail. SLP asks pt to bring piece of mail to next session to trial comprehension/attention strategies.    01-09-22: SLP assisted pt in determining best routine to facilitate daily HEP practice, see  handout. Education on reasoning for daily practice. PROM never returned so addressed this date, see above. During administration generated strategies to A pt in overcoming primary concerns: asking for more time, faciliating one on one conversations, sitting next to preferred communication partner during group dinners. Demonstrated all speak out exercises to aid in accurate completion during HEP. Pt averages as follows during brief practice  with rare min-A: 83 dB sustained AH, 78 dB reading, 72 dB cognitive exercise.   12/26/21: Reviewed general swallow precautions and strategies for drool management. He has noticed reduced drool since prior session by being mindful to swallow frequently and before speaking. Today he required verbal cues 2x to swallow when speaking due to saliva observed in corners of his mouth. They go out to eat every Wednesday evening with a large group. We generated a strategy of mentally preparing on the way to dinner to swallow with intent to aid carryover of swallow precautions in a distracting environment. Also generated strategy of his wife providing discrete hand signals to remind him to swallow or get louder when out with friends. Initiated HEP for dysarthria. He ordered Speak Out workbook yesterday.  Targeted volume and intelligibility using Speak Out! Lesson 1. Pt required frequent mod  verbal cues, modeling, for volume and breath support.   Pt averages the following volume levels:  Sustained AH: 85 dB  Counting:85  Reading (phrases): 85  Cognitive Exercise: 72 Required frequent max  verbal  & visual cues, modeling for carryover of intent /volume answering simple questions following structured practice.   PATIENT EDUCATION: Education details: see above Person educated: Patient and Spouse Education method: Customer service manager Education comprehension: verbalized understanding, returned demonstration, and needs further education  HOME EXERCISE PROGRAM: Speak Out   GOALS: Goals reviewed with patient? Yes  SHORT TERM GOALS: Target date: 01/14/2022  Pt will complete clinical swallow evaluation (CSE) to assess need for objective swallow evaluation  Baseline: 12/24/2021 Goal status: MET  2.  Pt will complete instrumental swallow evaluation, if indicated per CSE to objectively assess swallow function and efficacy Baseline:  Goal status: deferred d/t CSE results  3.  Pt will demonstrate  communication strategies to aid in attention, processing, and coherent discourse in 15 minute conversation with occasional min-A Baseline:  Goal status: MET  4.  Pt will meet dB targets for warm up and counting Speak Out exercises with rare min-A over 1 week period Baseline:  Goal status: MET  5.  Pt's care partner will teach back strategies and compensations to aid in pt's improved safety whilst eating meals at home over 1 week period Baseline:  Goal status: deferred   LONG TERM GOALS: Target date: 02/11/22  Pt will report daily HEP adherence for dysphagia and dysarthria with min-A from care partner over 1 week period Baseline:  Goal status: INITIAL  2.  Pt's average volume will exceed 68 dB in answering simple, 10 minute conversation, such as answering personally relevant questions with occasional min-A over 2 sessions Baseline:  Goal status: INITIAL  3.  Pt will demonstrate saliva management techniques with compensations PRN and rare verbal cues over 30 minutes Baseline:  Goal status: INITIAL  4.  Pt's care partner will report carryover of strategies and compensations to aid in improved safety whilst eating meals at home and subjective report of reduced instances of coughing during meal times over 1 week period Baseline:  Goal status: INITIAL  5.  Pt's volume will average greater than 70 dB during Speak Out reading and  cognitive exercises with occasional min-A over 2 sessions  Baseline:  Goal status: INITIAL   ASSESSMENT:  CLINICAL IMPRESSION: Patient is a 75 y.o. 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 Pt presents with mild dysarthria, cognitive impairment, and dysphagia. 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 Ongoing training in HEP for dysarthria, compensations for  intelligibility, attention, processing, and participation in conversations. Skilled ST is indicated.   OBJECTIVE IMPAIRMENTS: Objective impairments include attention, memory, executive functioning, expressive language, dysarthria, and dysphagia. These impairments are limiting patient from managing medications, managing appointments, managing finances, household responsibilities, ADLs/IADLs, effectively communicating at home and in community, and safety when swallowing.Factors affecting potential to achieve goals and functional outcome are ability to learn/carryover information and medical prognosis.. Patient will benefit from skilled SLP services to address above impairments and improve overall function.  REHAB POTENTIAL: Fair (degenerative dx)  PLAN:  SLP FREQUENCY: 2x/week  SLP DURATION: 8 weeks  PLANNED INTERVENTIONS: Aspiration precaution training, Pharyngeal strengthening exercises, Diet toleration management , Language facilitation, Environmental controls, Trials of upgraded texture/liquids, Cueing hierachy, Cognitive reorganization, Internal/external aids, Functional tasks, SLP instruction and feedback, Compensatory strategies, Patient/family education, and Re-evaluation    , Annye Rusk, CCC-SLP 01/23/2022, 4:06 PM

## 2022-01-28 ENCOUNTER — Ambulatory Visit: Payer: PPO | Admitting: Speech Pathology

## 2022-01-28 ENCOUNTER — Ambulatory Visit: Payer: PPO | Attending: Neurology | Admitting: Speech Pathology

## 2022-01-28 ENCOUNTER — Encounter: Payer: Self-pay | Admitting: Speech Pathology

## 2022-01-28 ENCOUNTER — Ambulatory Visit: Payer: PPO | Admitting: Physical Therapy

## 2022-01-28 DIAGNOSIS — R29818 Other symptoms and signs involving the nervous system: Secondary | ICD-10-CM | POA: Insufficient documentation

## 2022-01-28 DIAGNOSIS — R2689 Other abnormalities of gait and mobility: Secondary | ICD-10-CM | POA: Insufficient documentation

## 2022-01-28 DIAGNOSIS — R278 Other lack of coordination: Secondary | ICD-10-CM | POA: Diagnosis not present

## 2022-01-28 DIAGNOSIS — R41840 Attention and concentration deficit: Secondary | ICD-10-CM | POA: Diagnosis not present

## 2022-01-28 DIAGNOSIS — M6281 Muscle weakness (generalized): Secondary | ICD-10-CM | POA: Diagnosis not present

## 2022-01-28 DIAGNOSIS — R41841 Cognitive communication deficit: Secondary | ICD-10-CM | POA: Diagnosis not present

## 2022-01-28 DIAGNOSIS — R293 Abnormal posture: Secondary | ICD-10-CM | POA: Diagnosis not present

## 2022-01-28 DIAGNOSIS — R41844 Frontal lobe and executive function deficit: Secondary | ICD-10-CM | POA: Insufficient documentation

## 2022-01-28 DIAGNOSIS — R471 Dysarthria and anarthria: Secondary | ICD-10-CM | POA: Insufficient documentation

## 2022-01-28 DIAGNOSIS — R2681 Unsteadiness on feet: Secondary | ICD-10-CM | POA: Insufficient documentation

## 2022-01-28 NOTE — Patient Instructions (Signed)
  If you feel too loud, you are talking at a normal volume  Can Tony [name], Santiago Glad and Wells Guiles come to Speech Therapy?   Big breath when you are asked to repeat -   Taking a big breath when talking makes it so much easier to get loud  At home, do your exercises as loud as you do them in Speech therapy  Take a breath before each exercises and sentence in the work book?

## 2022-01-28 NOTE — Therapy (Signed)
OUTPATIENT SPEECH LANGUAGE PATHOLOGY TREATMENT NOTE   Patient Name: Tony [name] MRN: [id number] DOB:[birth year], 77 y.o., male Today's Date: 01/28/2022  PCP: Tony Solian, MD REFERRING PROVIDER: Marcial Pacas, MD    End of Session - 01/28/22 0849     Visit Number 7    Number of Visits 17    Date for SLP Re-Evaluation 02/11/22    SLP Start Time 0845    SLP Stop Time  0930    SLP Time Calculation (min) 45 min    Activity Tolerance Patient tolerated treatment well                Past Medical History:  Diagnosis Date   Anxiety    Boil of buttock    High cholesterol    Imbalance    Low back pain    Parkinsons    Past Surgical History:  Procedure Laterality Date   CYST REMOVAL LEG  2014   Patient Active Problem List   Diagnosis Date Noted   History of falling 11/29/2021   Unsteadiness on feet 11/29/2021   Excessive sleepiness 11/08/2021   Cognitive impairment 12/25/2020   Anxiety 04/25/2020   Gait abnormality 03/30/2020   Parkinson's disease 03/30/2020   Memory loss 03/30/2020   Parkinsonism 03/30/2020   CELLULITIS AND ABSCESS OF FACE 02/01/2008   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   THALASSEMIA [name] 09/12/2006   HYPERLIPIDEMIA 06/30/2006   DIVERTICULOSIS, COLON 06/30/2006   SEBORRHEIC KERATOSIS 06/30/2006   BACK PAIN, LUMBAR 06/30/2006    ONSET DATE: 11/29/2021 referral  REFERRING DIAG: R26.81 (ICD-10-CM) - Unsteadiness on feet Z91.81 (ICD-10-CM) - History of falling G20.C (ICD-10-CM) - Parkinsonism, unspecified Parkinsonism type   THERAPY DIAG:  Dysarthria and anarthria  Cognitive communication deficit  Rationale for Evaluation and Treatment Rehabilitation  SUBJECTIVE:   SUBJECTIVE STATEMENT: "II have been busy with family this weekend"   PAIN:  Are you having pain? No   OBJECTIVE:   TODAY'S TREATMENT:       01/28/22: Tony [name] reports he is processing slowly this morning. He had questions about cognitive exercise - Lesson 8 - we completed Lesson  8, after initial verbal cues and modeling Tony [name] completed cognitive exercise with rare min verbal cues.    Targeted volume and intelligibility using Speak Out! Lesson 8. Pt required occasional min verbal cues, modeling, for volume and breath support.   Pt averages the following volume levels:  Sustained AH: 94 dB  Counting: 80  Reading (phrases): 73  Cognitive Exercise: 72 Required occasional min verbal cues, modeling for carryover of intent /volume generating 2 sentences for 10 multiple meaning words. He required verbal cues to carryover this level of volume and intent in conversation. Educated it is normal to feel too loud when he starts to hit 70+dB. He benefited from verbal cues and modeling for breath support for volume and intent. Requested his daughters attend ST for partner training in compensations and supports for slow processing to help Tony [name] participate in conversation with family   01-23-22: Tony [name] did not understand cognitive exercise Lesson 9 - name 3-5 items in a category - After this was explained, he named 5 items in 4 categories with extended time and occasional semantic cues. He stated "I really struggle with this" Targeted volume using Speak Out! Loud Ah 90dB, Reading 75dB, cognitive exercise (resumed categorization after voice warm ups) 70dB with usual verbal and visual cues for volume. Trained in strategies to maximize his participation in family conversations including family education and awareness of flat affect and  monotone speech.  See patient instructions.   01-16-22: Pt reports improved saliva management with use of previously targeted strategies and knowledge of mechanism impacting need for intentional swallowing. Target improving vocal quality and increasing intensity through progressively difficulty speech tasks using Speak Out! program, lesson 7. ST leads pt through exercises providing occasional model prior to pt execution. occasional mod-A required to achieve target dB this  date. Averages this date: loud "ah" 90 dB; counting 76 dB; reading 75 dB; cognitive speech task 72 dB. Conversational sample of approx 5 minutes, pt averages 71 dB with occasional min-A. Pt rreports to having hard time reading, tells SLP "I ave trouble with the words" tells SLP everything seems jumbled. Wants to be able to read his mail. SLP asks pt to bring piece of mail to next session to trial comprehension/attention strategies.    01-09-22: SLP assisted pt in determining best routine to facilitate daily HEP practice, see handout. Education on reasoning for daily practice. PROM never returned so addressed this date, see above. During administration generated strategies to A pt in overcoming primary concerns: asking for more time, faciliating one on one conversations, sitting next to preferred communication partner during group dinners. Demonstrated all speak out exercises to aid in accurate completion during HEP. Pt averages as follows during brief practice with rare min-A: 83 dB sustained AH, 78 dB reading, 72 dB cognitive exercise.   12/26/21: Reviewed general swallow precautions and strategies for drool management. He has noticed reduced drool since prior session by being mindful to swallow frequently and before speaking. Today he required verbal cues 2x to swallow when speaking due to saliva observed in corners of his mouth. They go out to eat every Wednesday evening with a large group. We generated a strategy of mentally preparing on the way to dinner to swallow with intent to aid carryover of swallow precautions in a distracting environment. Also generated strategy of his wife providing discrete hand signals to remind him to swallow or get louder when out with friends. Initiated HEP for dysarthria. He ordered Speak Out workbook yesterday.  Targeted volume and intelligibility using Speak Out! Lesson 1. Pt required frequent mod  verbal cues, modeling, for volume and breath support.   Pt averages the  following volume levels:  Sustained AH: 85 dB  Counting:85  Reading (phrases): 85  Cognitive Exercise: 72 Required frequent max  verbal  & visual cues, modeling for carryover of intent /volume answering simple questions following structured practice.   PATIENT EDUCATION: Education details: see above Person educated: Patient and Spouse Education method: Customer service manager Education comprehension: verbalized understanding, returned demonstration, and needs further education  HOME EXERCISE PROGRAM: Speak Out   GOALS: Goals reviewed with patient? Yes  SHORT TERM GOALS: Target date: 01/14/2022  Pt will complete clinical swallow evaluation (CSE) to assess need for objective swallow evaluation  Baseline: 12/24/2021 Goal status: MET  2.  Pt will complete instrumental swallow evaluation, if indicated per CSE to objectively assess swallow function and efficacy Baseline:  Goal status: deferred d/t CSE results  3.  Pt will demonstrate communication strategies to aid in attention, processing, and coherent discourse in 15 minute conversation with occasional min-A Baseline:  Goal status: MET  4.  Pt will meet dB targets for warm up and counting Speak Out exercises with rare min-A over 1 week period Baseline:  Goal status: MET  5.  Pt's care partner will teach back strategies and compensations to aid in pt's improved safety whilst eating meals at  home over 1 week period Baseline:  Goal status: deferred   LONG TERM GOALS: Target date: 02/11/22  Pt will report daily HEP adherence for dysphagia and dysarthria with min-A from care partner over 1 week period Baseline:  Goal status: INITIAL  2.  Pt's average volume will exceed 68 dB in answering simple, 10 minute conversation, such as answering personally relevant questions with occasional min-A over 2 sessions Baseline:  Goal status: INITIAL  3.  Pt will demonstrate saliva management techniques with compensations PRN and  rare verbal cues over 30 minutes Baseline:  Goal status: INITIAL  4.  Pt's care partner will report carryover of strategies and compensations to aid in improved safety whilst eating meals at home and subjective report of reduced instances of coughing during meal times over 1 week period Baseline:  Goal status: INITIAL  5.  Pt's volume will average greater than 70 dB during Speak Out reading and cognitive exercises with occasional min-A over 2 sessions  Baseline:  Goal status: INITIAL   ASSESSMENT:  CLINICAL IMPRESSION: Patient is a 76 y.o. 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 Pt presents with mild dysarthria, cognitive impairment, and dysphagia. 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 Ongoing training in HEP for dysarthria, compensations for intelligibility, attention, processing, and participation in conversations. Skilled ST is indicated.   OBJECTIVE IMPAIRMENTS: Objective impairments include attention, memory, executive functioning, expressive language, dysarthria, and dysphagia. These impairments are limiting patient from managing medications, managing appointments, managing finances, household responsibilities, ADLs/IADLs, effectively communicating at home and in community, and safety when swallowing.Factors affecting potential to achieve goals and functional outcome are ability to learn/carryover information and medical prognosis.. Patient will benefit from skilled SLP services to address above impairments and improve overall function.  REHAB POTENTIAL: Fair (degenerative dx)  PLAN:  SLP FREQUENCY: 2x/week  SLP DURATION: 8 weeks  PLANNED INTERVENTIONS: Aspiration precaution training, Pharyngeal strengthening exercises, Diet toleration management , Language facilitation,  Environmental controls, Trials of upgraded texture/liquids, Cueing hierachy, Cognitive reorganization, Internal/external aids, Functional tasks, SLP instruction and feedback, Compensatory strategies, Patient/family education, and Re-evaluation    Lakitha Gordy, Annye Rusk, CCC-SLP 01/28/2022, 10:12 AM

## 2022-01-30 ENCOUNTER — Encounter: Payer: Self-pay | Admitting: Speech Pathology

## 2022-01-30 ENCOUNTER — Ambulatory Visit: Payer: PPO | Admitting: Physical Therapy

## 2022-01-30 ENCOUNTER — Ambulatory Visit: Payer: PPO | Admitting: Occupational Therapy

## 2022-01-30 ENCOUNTER — Ambulatory Visit: Payer: PPO | Admitting: Speech Pathology

## 2022-01-30 ENCOUNTER — Encounter: Payer: Self-pay | Admitting: Occupational Therapy

## 2022-01-30 DIAGNOSIS — R278 Other lack of coordination: Secondary | ICD-10-CM

## 2022-01-30 DIAGNOSIS — R41841 Cognitive communication deficit: Secondary | ICD-10-CM

## 2022-01-30 DIAGNOSIS — R471 Dysarthria and anarthria: Secondary | ICD-10-CM

## 2022-01-30 DIAGNOSIS — M6281 Muscle weakness (generalized): Secondary | ICD-10-CM

## 2022-01-30 DIAGNOSIS — R2681 Unsteadiness on feet: Secondary | ICD-10-CM

## 2022-01-30 NOTE — Patient Instructions (Addendum)
   Get the persons attention before you speak  Use eye contact and face the person you are speaking to  Short sentences - pause, give [name] time to process  Short directions 1 at a time - give him time to process  Write down short phrases/topics/choices to help [name] process in conversations  Be in close proximity to the person you are speaking to  Turn down any noise in the environment such as the TV, walk away from loud appliances, air conditioners, fans, dish washers etc  In large gatherings, sit or stay on the side not the center of the room  Try to sit with a wall behind you or in a corner so noise isn't coming at you from all directions when dining out or attending gatherings  Repeat back what you have heard  Ask for information in writing  If you need help processing a conversation, ask for help  Give them the finger - I need a minute  TO SIT:  Stay with walker - get in front of the chair Knees to chair Lock walker Hand on chair arms Sit   TO STAND: Push up from arm rests Take break off Hands around breaks while walking

## 2022-01-30 NOTE — Therapy (Signed)
OUTPATIENT SPEECH LANGUAGE PATHOLOGY TREATMENT NOTE   Patient Name: Tony [name] MRN: [id number] DOB:1945/07/09, 76 y.o., male Today's Date: 01/30/2022  PCP: Prince Solian, MD REFERRING PROVIDER: Marcial Pacas, MD    End of Session - 01/30/22 1050     Visit Number 8    Number of Visits 17    Date for SLP Re-Evaluation 02/11/22    Authorization Type [employer]    SLP Start Time 0930    SLP Stop Time  1015    SLP Time Calculation (min) 45 min    Activity Tolerance Patient tolerated treatment well                 Past Medical History:  Diagnosis Date   Anxiety    Boil of buttock    High cholesterol    Imbalance    Low back pain    Parkinsons    Past Surgical History:  Procedure Laterality Date   CYST REMOVAL LEG  2014   Patient Active Problem List   Diagnosis Date Noted   History of falling 11/29/2021   Unsteadiness on feet 11/29/2021   Excessive sleepiness 11/08/2021   Cognitive impairment 12/25/2020   Anxiety 04/25/2020   Gait abnormality 03/30/2020   Parkinson's disease 03/30/2020   Memory loss 03/30/2020   Parkinsonism 03/30/2020   CELLULITIS AND ABSCESS OF FACE 02/01/2008   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/2006   HYPERLIPIDEMIA 06/30/2006   DIVERTICULOSIS, COLON 06/30/2006   SEBORRHEIC KERATOSIS 06/30/2006   BACK PAIN, LUMBAR 06/30/2006    ONSET DATE: 11/29/2021 referral  REFERRING DIAG: R26.81 (ICD-10-CM) - Unsteadiness on feet Z91.81 (ICD-10-CM) - History of falling G20.C (ICD-10-CM) - Parkinsonism, unspecified Parkinsonism type   THERAPY DIAG:  Dysarthria and anarthria  Cognitive communication deficit  Rationale for Evaluation and Treatment Rehabilitation  SUBJECTIVE:   SUBJECTIVE STATEMENT: "I brougnt my daughter"   PAIN:  Are you having pain? No   OBJECTIVE:   TODAY'S TREATMENT:     01/30/22: Lara Mulch daughter attended session today. Session focused on family and environmental strategies  to support [name]'s processing, attention and communication. [name] initially came in and was given multiple directions on how to sit, where to put his walker and where to sit. [name] became confused and used unsafe sitting decisions. This was used as an example to provide [name] 1 instruction at a time, pause, allowing him to complete 1 step, then giving the next direction. I provided written steps for safe sitting and standing for [name] to read and process at his own speed. I also demonstrated use of [place]urry board to support [name] in processing directions, topics, choices, and schedule changes. We reviewed Monday's example of [name] receiving a phone call that PT was cancelled, the fronto office understood that [name] also wanted to cancel ST, his ST appointment was cancelled, then he showed up for the [place] appointment. I demonstrated to  [name] and Santiago Glad how writing this info down for him can help communication breakdowns. I Demonstrated extra pauses and use of gestures in conversation, explaining to both of them that it feels  unnatural, but pauses give [name] a chance to understand and process conversation better. We demonstrated how giving a pause helped [name] answer questions accurately, rather than immediately rephrasing the question, which results in breakdown of processing. [name] hit targets in [place] and [name] endorse he is not as loud at home. Santiago Glad aware to cue his volume at home. Demonstrated Sound level meter, 90 dB  vs 70dB and showed Santiago Glad the targets for each exercise. [name] maintained 70-72dB in short conversation re: [place] football and the play offs with rare min A, with extended time. Encouraged Santiago Glad to share the handout (See patient instructions) with her family. Also educated [name] and Santiago Glad how flat affect and monotone speech can negatively affect a listeners perception of [name]'s interest and desire to communicate. Encouraged [name] to self advocate when he is not understanding conversation or information.      01/28/22: [name] reports he is processing slowly this morning. He had questions about cognitive exercise - Lesson 8 - we completed Lesson 8, after initial verbal cues and modeling [name] completed cognitive exercise with rare min verbal cues.    Targeted volume and intelligibility using Speak Out! Lesson 8. Pt required occasional min verbal cues, modeling, for volume and breath support.   Pt averages the following volume levels:  Sustained AH: 94 dB  Counting: 80  Reading (phrases): 73  Cognitive Exercise: 72 Required occasional min verbal cues, modeling for carryover of intent /volume generating 2 sentences for 10 multiple meaning words. He required verbal cues to carryover this level of volume and intent in conversation. Educated it is normal to feel too loud when he starts to hit 70+dB. He benefited from verbal cues and modeling for breath support for volume and intent. Requested his daughters attend ST for partner training in compensations and supports for slow processing to help [name] participate in conversation with family   01-23-22: [name] did not understand cognitive exercise Lesson 9 - name 3-5 items in a category - After this was explained, he named 5 items in 4 categories with extended time and occasional semantic cues. He stated "I really struggle with this" Targeted volume using Speak Out! Loud Ah 90dB, Reading 75dB, cognitive exercise (resumed categorization after voice warm ups) 70dB with usual verbal and visual cues for volume. Trained in strategies to maximize his participation in family conversations including family education and awareness of flat affect and monotone speech.  See patient instructions.    PATIENT EDUCATION: Education details: see above Person educated: Patient and Spouse Education method: Customer service manager Education comprehension: verbalized understanding, returned demonstration, and needs further education  HOME EXERCISE PROGRAM: Speak Out   GOALS: Goals  reviewed with patient? Yes  SHORT TERM GOALS: Target date: 01/14/2022  Pt will complete clinical swallow evaluation (CSE) to assess need for objective swallow evaluation  Baseline: 12/24/2021 Goal status: MET  2.  Pt will complete instrumental swallow evaluation, if indicated per CSE to objectively assess swallow function and efficacy Baseline:  Goal status: deferred d/t CSE results  3.  Pt will demonstrate communication strategies to aid in attention, processing, and coherent discourse in 15 minute conversation with occasional min-A Baseline:  Goal status: MET  4.  Pt will meet dB targets for warm up and counting Speak Out exercises with rare min-A over 1 week period Baseline:  Goal status: MET  5.  Pt's care partner will teach back strategies and compensations to aid in pt's improved safety whilst eating meals at home over 1 week period Baseline:  Goal status: deferred   LONG TERM GOALS: Target date: 02/11/22  Pt will report daily HEP adherence for dysphagia and dysarthria with min-A from care partner over 1 week period Baseline:  Goal status: ONGOING  2.  Pt's average volume will exceed 68 dB in answering simple, 10 minute conversation, such as answering personally relevant questions with occasional min-A over 2 sessions Baseline:  Goal status: ONGOING  3.  Pt will demonstrate saliva management techniques with compensations PRN and rare verbal cues over 30 minutes Baseline:  Goal status: ONGOING  4.  Pt's care partner will report carryover of strategies and compensations to aid in improved safety whilst eating meals at home and subjective report of reduced instances of coughing during meal times over 1 week period Baseline:  Goal status: ONGOING  5.  Pt's volume will average greater than 70 dB during Speak Out reading and cognitive exercises with occasional min-A over 2 sessions  Baseline:  Goal status: ONGOING   ASSESSMENT:  CLINICAL IMPRESSION: Patient is a 76  y.o. 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 Pt presents with mild dysarthria, cognitive impairment, and dysphagia. arily by reduced volume and imprecise articulation. Ongoing training in HEP for dysarthria, compensations for intelligibility, attention, processing, and participation in conversations. Family/caregiver training to support communication and cognition will be beneficial. Skilled ST is indicated to maximize intelligibility, cognition for safety, reduce caregiver burden and independence   OBJECTIVE IMPAIRMENTS: Objective impairments include attention, memory, executive functioning, expressive language, dysarthria, and dysphagia. These impairments are limiting patient from managing medications, managing appointments, managing finances, household responsibilities, ADLs/IADLs, effectively communicating at home and in community, and safety when swallowing.Factors affecting potential to achieve goals and functional outcome are ability to learn/carryover information and medical prognosis.. Patient will benefit from skilled SLP services to address above impairments and improve overall function.  REHAB POTENTIAL: Fair (degenerative dx)  PLAN:  SLP FREQUENCY: 2x/week  SLP DURATION: 8 weeks  PLANNED INTERVENTIONS: Aspiration precaution training, Pharyngeal strengthening exercises, Diet toleration management , Language facilitation, Environmental controls, Trials of upgraded texture/liquids, Cueing hierachy, Cognitive reorganization, Internal/external aids, Functional tasks, SLP instruction and feedback, Compensatory strategies, Patient/family education, and Re-evaluation    Yazmina Pareja, Annye Rusk, CCC-SLP 01/30/2022, 11:42 AM

## 2022-01-30 NOTE — Therapy (Signed)
OUTPATIENT OCCUPATIONAL THERAPY PARKINSON'S TREATMENT  Patient Name: Tony [name] MRN: [id number] DOB:1945/05/13, 76 y.o., male Today's Date: 01/30/2022  PCP: Dr. Steva Ready REFERRING PROVIDER: Dr. Krista Blue   OT End of Session - 01/30/22 0851     Visit Number 2    Number of Visits 25    Date for OT Re-Evaluation 04/03/22    Authorization Type [employer]    Authorization - Visit Number 2    Authorization - Number of Visits 10    Progress Note Due on Visit 10    OT Start Time 0850    OT Stop Time 0930    OT Time Calculation (min) 40 min    Equipment Utilized During Treatment Goes by [name]    Activity Tolerance Patient tolerated treatment well    Behavior During Therapy WFL for tasks assessed/performed             Past Medical History:  Diagnosis Date   Anxiety    Boil of buttock    High cholesterol    Imbalance    Low back pain    Parkinsons    Past Surgical History:  Procedure Laterality Date   CYST REMOVAL LEG  2014   Patient Active Problem List   Diagnosis Date Noted   History of falling 11/29/2021   Unsteadiness on feet 11/29/2021   Excessive sleepiness 11/08/2021   Cognitive impairment 12/25/2020   Anxiety 04/25/2020   Gait abnormality 03/30/2020   Parkinson's disease 03/30/2020   Memory loss 03/30/2020   Parkinsonism 03/30/2020   CELLULITIS AND ABSCESS OF FACE 02/01/2008   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   THALASSEMIA [place] 09/12/2006   HYPERLIPIDEMIA 06/30/2006   DIVERTICULOSIS, COLON 06/30/2006   SEBORRHEIC KERATOSIS 06/30/2006   BACK PAIN, LUMBAR 06/30/2006    ONSET DATE: 11/29/21  REFERRING DIAG:  Diagnosis  R26.81 (ICD-10-CM) - Unsteadiness on feet  Z91.81 (ICD-10-CM) - History of falling  G20.C (ICD-10-CM) - Parkinsonism, unspecified Parkinsonism type    THERAPY DIAG:  Unsteadiness on feet  Other lack of coordination  Muscle weakness (generalized)  Rationale for Evaluation and Treatment: Rehabilitation  SUBJECTIVE:    SUBJECTIVE STATEMENT: Pt reports difficulty with buttons Goes by [name] Pt accompanied by: self  PERTINENT HISTORY:  per Dr. Krista Blue [facility] in July 2022 showed bilateral decreased radioactive tracing in the putamen and asymmetric decreased radiotracer activity in the head of the right caudate nucleus His cognitive impairment progress quickly compared to the idiopathic typical Parkinson's patient, he seems to have less optimal response to Sinemet, differentiation diagnosis including idiopathic Parkinson's disease with cognitive impairment versus Lewy body dementia. PMH: anxiety, hyperlipidemia, back pain  PRECAUTIONS: Fall  WEIGHT BEARING RESTRICTIONS: No  PAIN:  Are you having pain? No  FALLS: Has patient fallen in last 6 months? Yes. Number of falls 5  LIVING ENVIRONMENT: Lives with: lives with their spouse, dtr's assist Lives in: [place] Stairs: No Has following equipment at home:  rollator  PLOF: Independent  PATIENT GOALS: increased ease with dressing and fine motor coordination  OBJECTIVE:   HAND DOMINANCE: Right  ADLs: Overall ADLs: Pt 's dtr's assist Transfers/ambulation related to ADLs: Eating: increased spills needs assist with cutting food Grooming: mod I UB Dressing:  able to donn shirt with setup, difficulty donning/ doffing jacket LB Dressing: min A pants/ socks , uses slip on shoes,  Toileting: mod I with rollator  Bathing: distant supervision Tub Shower transfers:stepping into shower /tub needs supervision, has grab bar   IADLs: Goes to PD cycle on Tues/ Thurs  Handwriting: 100% legible  MOBILITY STATUS: Hx of falls, start hesitation, and uses rollator  POSTURE COMMENTS:  rounded shoulders and forward head   FUNCTIONAL OUTCOME MEASURES: Fastening/unfastening 3 buttons: unable Physical performance test: PPT#2 (simulated eating) 28.65 & PPT#4 (donning/doffing jacket): NT 01/30/22: PPT#4 = 1 min. 22 sec  COORDINATION: 9 Hole Peg test: Right: 69.57 sec;  Left: 2 mins 20  sec Box and Blocks:  Right 21 blocks, Left 11blocks Resting tremor LUE UE ROM:   shoulder flexion: RUE 130. LUE 125 elbow flexion/ extension WFLS   UE MMT:   Not tested  SENSATION: Not tested  Vision:not formally tested, TBA in functional context  COGNITION: Overall cognitive status: Impaired  OBSERVATIONS: Bradykinesia   TODAY'S TREATMENT:                                                                                                                              Assessed PPT #4 and updated goal - see above measurement.  Practiced donning/doffing jacket cape method, however d/t cognitive deficits and severe apraxia, practiced more traditional way w/ cues for large amplitude movements, wider BOS, punch arms through. Pt required max cueing to don/doff for greatest result/efficiency and struggled w/ grabbing jacket at sleeves when doffing (both cognitively and physically).   Discussed safety w/ rollator and transfers - practiced sit to stand and transfers w/ max cues to lock rollator and feel chair behind knees before sitting.  Discussed wider, staggered BOS for feet with standing.   Pt instructed to sit on shower chair when bathing and to sit to don pants over feet as well.   PATIENT EDUCATION: See above (daughter also present for education and verbalized understanding, however pt will need reinforcement)   HOME EXERCISE PROGRAM: N/A  GOALS:  Goals reviewed with patient? Yes  SHORT TERM GOALS: Target date: 02/06/22  I with PD specific HEP Baseline: Goal status: INITIAL  2.  Pt will verbalize understanding of adapted strategies to maximize safety and I with ADLs/ [place] to minimize fall risk . Baseline:  Goal status: INITIAL  3.  Pt will demonstrate improved ease with feeding as evidenced by decreasing PPT#2 (self feeding) by 3 secs Baseline:  Goal status: INITIAL  4.  Pt will demonstrate improved UE functional use as evidenced by increasing box and  blocks score by 4 blocks bilaterally. Baseline:  Right 21 blocks, Left 11 blocks Goal status: INITIAL  5.    Improve PPT#4 to under 1 minute Baseline: 1 min. 22 sec Goal status: INITIAL    LONG TERM GOALS: Target date: 04/03/22  Pt will verbalize understanding of ways to prevent future  PD related complications and PD community resources. Baseline:  Goal status: INITIAL  2. Pt will demonstrate improved fine motor coordination for ADLs as evidenced by decreasing 9 hole peg test score for RUE by 4 secs Baseline: 9 Hole Peg test: Right: 69.57 sec; Left: 2 mins 20  sec Goal status: INITIAL  3.  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 Will need continued reinforcement of all ADL strategies  PERFORMANCE DEFICITS: in functional skills including ADLs, IADLs, coordination, dexterity, strength, flexibility, Fine motor control, Gross motor control, mobility, balance, endurance, decreased knowledge of precautions, decreased knowledge of use of DME, and UE functional use, cognitive skills including memory, problem solving, safety awareness, and thought, and psychosocial skills including coping strategies, environmental adaptation, habits, interpersonal interactions, and routines and behaviors.   IMPAIRMENTS: are limiting patient from ADLs, IADLs, play, leisure, and social participation.   COMORBIDITIES:  may have co-morbidities  that affects occupational performance. Patient will benefit from skilled OT to address above impairments and improve overall function.  MODIFICATION OR ASSISTANCE TO  COMPLETE EVALUATION: No modification of tasks or assist necessary to complete an evaluation.  OT OCCUPATIONAL PROFILE AND HISTORY: Detailed assessment: Review of records and additional review of physical, cognitive, psychosocial history related to current functional performance.  CLINICAL DECISION MAKING: LOW - limited treatment options, no task modification necessary  REHAB POTENTIAL: Good  EVALUATION COMPLEXITY: Low    PLAN:  OT FREQUENCY: 2x/week  OT DURATION: 12 weeks plus eval to account for scheduling  PLANNED INTERVENTIONS: self care/ADL training, therapeutic exercise, therapeutic activity, neuromuscular re-education, manual therapy, passive range of motion, gait training, balance training, functional mobility training, aquatic therapy, splinting, ultrasound, paraffin, fluidotherapy, moist heat, cryotherapy, patient/family education, cognitive remediation/compensation, visual/perceptual remediation/compensation, energy conservation, coping strategies training, DME and/or AE instructions, and Re-evaluation  RECOMMENDED OTHER SERVICES: PT, ST  CONSULTED AND AGREED WITH PLAN OF CARE: Patient and family member/caregiver  PLAN FOR NEXT SESSION: simple coordination HEP (4 ex's or less) and  simple bag exercises for dressing, reinforce safety/fall prevention, ADL strategies   Hans Eden, OT 01/30/2022, 8:52 AM

## 2022-02-06 ENCOUNTER — Ambulatory Visit: Payer: PPO | Admitting: Speech Pathology

## 2022-02-06 ENCOUNTER — Ambulatory Visit: Payer: PPO | Admitting: Physical Therapy

## 2022-02-06 ENCOUNTER — Ambulatory Visit: Payer: PPO | Admitting: Occupational Therapy

## 2022-02-06 ENCOUNTER — Encounter: Payer: Self-pay | Admitting: Physical Therapy

## 2022-02-06 ENCOUNTER — Encounter: Payer: Self-pay | Admitting: Speech Pathology

## 2022-02-06 DIAGNOSIS — R41841 Cognitive communication deficit: Secondary | ICD-10-CM

## 2022-02-06 DIAGNOSIS — M6281 Muscle weakness (generalized): Secondary | ICD-10-CM

## 2022-02-06 DIAGNOSIS — R2689 Other abnormalities of gait and mobility: Secondary | ICD-10-CM

## 2022-02-06 DIAGNOSIS — R293 Abnormal posture: Secondary | ICD-10-CM

## 2022-02-06 DIAGNOSIS — R2681 Unsteadiness on feet: Secondary | ICD-10-CM

## 2022-02-06 DIAGNOSIS — R471 Dysarthria and anarthria: Secondary | ICD-10-CM

## 2022-02-06 NOTE — Therapy (Signed)
OUTPATIENT SPEECH LANGUAGE PATHOLOGY TREATMENT NOTE   Patient Name: Tony [name] MRN: [id number] DOB:23-Jun-1945, 76 y.o., male Today's Date: 02/06/2022  PCP: Tony Solian, MD REFERRING PROVIDER: Marcial Pacas, MD    End of Session - 02/06/22 1020     Visit Number 9    Number of Visits 17    Date for SLP Re-Evaluation 02/11/22    Authorization Type [employer]    SLP Start Time 1015    SLP Stop Time  1100    SLP Time Calculation (min) 45 min    Activity Tolerance Patient tolerated treatment well                 Past Medical History:  Diagnosis Date   Anxiety    Boil of buttock    High cholesterol    Imbalance    Low back pain    Parkinsons    Past Surgical History:  Procedure Laterality Date   CYST REMOVAL LEG  2014   Patient Active Problem List   Diagnosis Date Noted   History of falling 11/29/2021   Unsteadiness on feet 11/29/2021   Excessive sleepiness 11/08/2021   Cognitive impairment 12/25/2020   Anxiety 04/25/2020   Gait abnormality 03/30/2020   Parkinson's disease 03/30/2020   Memory loss 03/30/2020   Parkinsonism 03/30/2020   CELLULITIS AND ABSCESS OF FACE 02/01/2008   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/2006   HYPERLIPIDEMIA 06/30/2006   DIVERTICULOSIS, COLON 06/30/2006   SEBORRHEIC KERATOSIS 06/30/2006   BACK PAIN, LUMBAR 06/30/2006    ONSET DATE: 11/29/2021 referral  REFERRING DIAG: R26.81 (ICD-10-CM) - Unsteadiness on feet Z91.81 (ICD-10-CM) - History of falling G20.C (ICD-10-CM) - Parkinsonism, unspecified Parkinsonism type   THERAPY DIAG:  Dysarthria and anarthria  Cognitive communication deficit  Rationale for Evaluation and Treatment Rehabilitation  SUBJECTIVE:   SUBJECTIVE STATEMENT: "I brougnt my daughter"   PAIN:  Are you having pain? No   OBJECTIVE:   TODAY'S TREATMENT:     02/06/22: Tony [name], spouse present for caregiver training. I demonstrated and instructed on using short phrases  with extended pauses, giving 1 direction at a time, using [employer] board to give written cues and intentionally bringing Tony [name] into a conversation. Tony [name] agreed that she gets frustrated when Tony [name] is slow to respond and this causes disagreements in the home. Educated how her expectation of faster processing and responding exacerbates Tony [name]'s processing difficulties and results in him not being able to respond. Explained and modeled giving Tony [name] extra time to answer a question, and how when he doesn't respond right a way, asking another question or follow up question makes his processing even more difficult. They Id'd that bill paying was resulting in communication breakdown because Tony [name] could not explain to Tony [name] how to pay a bill and the steps to take to access online, auto payments and phone payments. I provided a log of bills, due dates, method of payment, amount and passwords. They will work with their daughter, Tony [name] who helps with bills, to fill out the chart to support Tony [name]'s communication and processing as he instructs Tony [name] on bill paying. Speak Out completed (modified to 3 reps of warm ups, 5 sentences and 3 cognitive activities due to time, so Tony [name] could hear how loud he is supposed to be. They agree he is not completing Speak Out as loud at home. I demonstrated cueing - 1 cue, with a pause for cognitive activity of naming 3 items in a category. They reports they attempted to go to  game night at church, but this was too much for Tony [name]. I encouraged them to have game night at home with no timers, slow pace and short cues for Tony [name] to allow him to participate in game night as he enjoys this. Provided list of cognitive activities for game night . See pt instructions.   01/30/22: Tony [name], Tony [name]'s daughter attended session today. Session focused on family and environmental strategies to support Tony [name]'s processing, attention and communication. Tony [name] initially came in and was given multiple directions on how to sit, where to put his  walker and where to sit. Tony [name] became confused and used unsafe sitting decisions. This was used as an example to provide Tony [name] 1 instruction at a time, pause, allowing him to complete 1 step, then giving the next direction. I provided written steps for safe sitting and standing for Tony [name] to read and process at his own speed. I also demonstrated use of [place] board to support Tony [name] in processing directions, topics, choices, and schedule changes. We reviewed Monday's example of Tony [name] receiving a phone call that PT was cancelled, the fronto office understood that Tony [name] also wanted to cancel ST, his ST appointment was cancelled, then he showed up for the [place] appointment. I demonstrated to  Tony [name] and Tony [name] how writing this info down for him can help communication breakdowns. I Demonstrated extra pauses and use of gestures in conversation, explaining to both of them that it feels  unnatural, but pauses give Tony [name] a chance to understand and process conversation better. We demonstrated how giving a pause helped Tony [name] answer questions accurately, rather than immediately rephrasing the question, which results in breakdown of processing. Tony [name] hit targets in [place] and Tony [name] endorse he is not as loud at home. Tony [name] aware to cue his volume at home. Demonstrated Sound level meter, 90 dB vs 70dB and showed Tony [name] the targets for each exercise. Tony [name] maintained 70-72dB in short conversation re: [place] football and the play offs with rare min A, with extended time. Encouraged Tony [name] to share the handout (See patient instructions) with her family. Also educated Tony [name] and Tony [name] how flat affect and monotone speech can negatively affect a listeners perception of Tony [name]'s interest and desire to communicate. Encouraged Tony [name] to self advocate when he is not understanding conversation or information.     01/28/22: Tony [name] reports he is processing slowly this morning. He had questions about cognitive exercise - Lesson 8 - we completed Lesson 8, after initial  verbal cues and modeling Tony [name] completed cognitive exercise with rare min verbal cues.    Targeted volume and intelligibility using Speak Out! Lesson 8. Pt required occasional min verbal cues, modeling, for volume and breath support.   Pt averages the following volume levels:  Sustained AH: 94 dB  Counting: 80  Reading (phrases): 73  Cognitive Exercise: 72 Required occasional min verbal cues, modeling for carryover of intent /volume generating 2 sentences for 10 multiple meaning words. He required verbal cues to carryover this level of volume and intent in conversation. Educated it is normal to feel too loud when he starts to hit 70+dB. He benefited from verbal cues and modeling for breath support for volume and intent. Requested his daughters attend ST for partner training in compensations and supports for slow processing to help Tony [name] participate in conversation with family   01-23-22: Tony [name] did not understand cognitive exercise Lesson 9 - name 3-5 items in a category - After this was explained, he named 5 items in 4 categories with  extended time and occasional semantic cues. He stated "I really struggle with this" Targeted volume using Speak Out! Loud Ah 90dB, Reading 75dB, cognitive exercise (resumed categorization after voice warm ups) 70dB with usual verbal and visual cues for volume. Trained in strategies to maximize his participation in family conversations including family education and awareness of flat affect and monotone speech.  See patient instructions.    PATIENT EDUCATION: Education details: see above Person educated: Patient and Spouse Education method: Customer service manager Education comprehension: verbalized understanding, returned demonstration, and needs further education  HOME EXERCISE PROGRAM: Speak Out   GOALS: Goals reviewed with patient? Yes  SHORT TERM GOALS: Target date: 01/14/2022  Pt will complete clinical swallow evaluation (CSE) to assess need for  objective swallow evaluation  Baseline: 12/24/2021 Goal status: MET  2.  Pt will complete instrumental swallow evaluation, if indicated per CSE to objectively assess swallow function and efficacy Baseline:  Goal status: deferred d/t CSE results  3.  Pt will demonstrate communication strategies to aid in attention, processing, and coherent discourse in 15 minute conversation with occasional min-A Baseline:  Goal status: MET  4.  Pt will meet dB targets for warm up and counting Speak Out exercises with rare min-A over 1 week period Baseline:  Goal status: MET  5.  Pt's care partner will teach back strategies and compensations to aid in pt's improved safety whilst eating meals at home over 1 week period Baseline:  Goal status: deferred   LONG TERM GOALS: Target date: 02/11/22  Pt will report daily HEP adherence for dysphagia and dysarthria with min-A from care partner over 1 week period Baseline:  Goal status: ONGOING  2.  Pt's average volume will exceed 68 dB in answering simple, 10 minute conversation, such as answering personally relevant questions with occasional min-A over 2 sessions Baseline:  Goal status: ONGOING  3.  Pt will demonstrate saliva management techniques with compensations PRN and rare verbal cues over 30 minutes Baseline:  Goal status: ONGOING  4.  Pt's care partner will report carryover of strategies and compensations to aid in improved safety whilst eating meals at home and subjective report of reduced instances of coughing during meal times over 1 week period Baseline:  Goal status: ONGOING  5.  Pt's volume will average greater than 70 dB during Speak Out reading and cognitive exercises with occasional min-A over 2 sessions  Baseline:  Goal status: ONGOING   ASSESSMENT:  CLINICAL IMPRESSION: Patient is a 76 y.o. M who was seen today for cognitive linguistic evaluation in presence of idiopathic Parkinson's disease with cognitive impairment versus Lewy  body dementia. Pt presents with mild dysarthria, cognitive impairment, and dysphagia. arily by reduced volume and imprecise articulation. Ongoing training in HEP for dysarthria, compensations for intelligibility, attention, processing, and participation in conversations. Family/caregiver training to support communication and cognition will be beneficial. Skilled ST is indicated to maximize intelligibility, cognition for safety, reduce caregiver burden and independence   OBJECTIVE IMPAIRMENTS: Objective impairments include attention, memory, executive functioning, expressive language, dysarthria, and dysphagia. These impairments are limiting patient from managing medications, managing appointments, managing finances, household responsibilities, ADLs/IADLs, effectively communicating at home and in community, and safety when swallowing.Factors affecting potential to achieve goals and functional outcome are ability to learn/carryover information and medical prognosis.. Patient will benefit from skilled SLP services to address above impairments and improve overall function.  REHAB POTENTIAL: Fair (degenerative dx)  PLAN:  SLP FREQUENCY: 2x/week  SLP DURATION: 8 weeks  PLANNED INTERVENTIONS: Aspiration  precaution training, Pharyngeal strengthening exercises, Diet toleration management , Language facilitation, Environmental controls, Trials of upgraded texture/liquids, Cueing hierachy, Cognitive reorganization, Internal/external aids, Functional tasks, SLP instruction and feedback, Compensatory strategies, Patient/family education, and Re-evaluation    Lior Cartelli, Annye Rusk, CCC-SLP 02/06/2022, 1:14 PM

## 2022-02-06 NOTE — Patient Instructions (Signed)
Tony [name] has slow processing - he benefits from pauses, shorter sentences and visual/written cues  Use a [place] board to give visual cues - speech is fast and goes away  - writing allows for Tony [name] to process at his speed  Have Tony [name] make a list of bills that are auto, online, and check  Write instructions for how to get on for each bill  Try to have the 4 words he remembers relevant to him (names of people that he forgets, events, restaurant you are going to, a place he needs to remember)  When others are in a hurry and expect a response quickly, this will cause you anxiety and you won't be able to process at all  Tony [name], let the other person on the phone know you need more time to understand and that they need to talk slowly  Game night would be great - but no timers, go slower and give Tony [name] extra time and short cues  They sell card holders for people with weakness   Checkers Chess Connect [place] saw puzzles Easy cross words Memory match Board games [employer] Outburst (no timer) Family Fued - no timer Learn a new game!     I

## 2022-02-06 NOTE — Therapy (Signed)
OUTPATIENT PHYSICAL THERAPY NEURO TREATMENT/RE-CERT   Patient Name: Tony [name] MRN: [id number] DOB:04-27-1945, 76 y.o., male Today's Date: 02/06/2022   PCP: Prince Solian, MD  REFERRING PROVIDER: Marcial Pacas, MD    PT End of Session - 02/06/22 [phone]     Visit Number 16    Number of Visits 20   Plus eval   Date for PT Re-Evaluation 85/92/92   re-cert   Authorization Type [employer]    PT Start Time 0935    PT Stop Time 1015    PT Time Calculation (min) 40 min    Equipment Utilized During Treatment Gait belt    Activity Tolerance Patient tolerated treatment well    Behavior During Therapy WFL for tasks assessed/performed                   Past Medical History:  Diagnosis Date   Anxiety    Boil of buttock    High cholesterol    Imbalance    Low back pain    Parkinsons    Past Surgical History:  Procedure Laterality Date   CYST REMOVAL LEG  2014   Patient Active Problem List   Diagnosis Date Noted   History of falling 11/29/2021   Unsteadiness on feet 11/29/2021   Excessive sleepiness 11/08/2021   Cognitive impairment 12/25/2020   Anxiety 04/25/2020   Gait abnormality 03/30/2020   Parkinson's disease 03/30/2020   Memory loss 03/30/2020   Parkinsonism 03/30/2020   CELLULITIS AND ABSCESS OF FACE 02/01/2008   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/2006   HYPERLIPIDEMIA 06/30/2006   DIVERTICULOSIS, COLON 06/30/2006   SEBORRHEIC KERATOSIS 06/30/2006   BACK PAIN, LUMBAR 06/30/2006    ONSET DATE: 11/08/2021   REFERRING DIAG: G20 (ICD-10-CM) - Parkinson's disease R41.89 (ICD-10-CM) - Cognitive impairment F41.9 (ICD-10-CM) - Anxiety R26.9 (ICD-10-CM) - Gait abnormality G47.10 (ICD-10-CM) - Excessive sleepiness   THERAPY DIAG:  Unsteadiness on feet  Muscle weakness (generalized)  Other abnormalities of gait and mobility  Abnormal posture  Rationale for Evaluation and Treatment Rehabilitation  SUBJECTIVE:                                                                                                                                                                                               SUBJECTIVE STATEMENT: Had a busy week last week. No falls. Got a new rollator that is a little taller and feels better with it. Gave his other rollator to his daughter's mother in law. Still going to cycling 2x a week Tuesday and Thursdays. Feels like balance is doing better. Also [place]  across the street from a gym and does this when he does not go to cycling. Uses the recumbent bike.   Pt accompanied by: Daughter Tony [name]   PERTINENT HISTORY: DaTSCAN in July 2022 showed bilateral decreased radioactive tracing in the putamen and asymmetric decreased radiotracer activity in the head of the right caudate nucleus His cognitive impairment progress quickly compared to the idiopathic typical Parkinson's patient, he seems to have less optimal response to Sinemet, differentiation diagnosis including idiopathic Parkinson's disease with cognitive impairment versus Lewy body dementia  PAIN:  Are you having pain? No  PRECAUTIONS: Fall  WEIGHT BEARING RESTRICTIONS No  FALLS: Has patient fallen in last 6 months? Yes. Number of falls ~6 or more   PLOF: Independent  PATIENT GOALS "Work on my dexterity"    OBJECTIVE:   DIAGNOSTIC FINDINGS: DaTSCAN in July 2022 showed bilateral decreased radioactive tracing in the putamen and asymmetric decreased radiotracer activity in the head of the right caudate nucleus   COGNITION: Overall cognitive status: Impaired Noted saccadic intrusions of L eye during eval   POSTURE: rounded shoulders, forward head, and flexed trunk    TODAY'S TREATMENT:  Ther Act   Discussed POC going forwards - pt feeling better about his mobility and balance and would like to focus more on OT at this time. Will plan to D/C at end of current scheduled appts with pt in agreement with plan Discussed  return PD evals vs. Screens in 6 months, with pt agreeing to this after being done with OT. Pt will benefit most from evals Educated on use of rollator at all times at home to decr risk of falls and potentially a serious injury (pt currently ambulating with no AD at home) as pt much more steady with use of rollator Reviewed standing with wide BOS when doing static tasks, such as brushing his teeth for improved balance Educated on importance of walking/aerobic exercise - pt currently going to cycling classes as well as using a recumbent bike on days that he does not go to cycling classes.    [facility] PT Assessment - 02/06/22 0948       Ambulation/Gait   Gait velocity 16.91 seconds = 1.94 ft/sec   with rollator     Standardized Balance Assessment   Standardized Balance Assessment Five Times Sit to Stand    Five times sit to stand comments  13.9 seconds with no UE support      Mini-BESTest   Sit To Stand Normal: Comes to stand without use of hands and stabilizes independently.    Rise to Toes Moderate: Heels up, but not full range (smaller than when holding hands), OR noticeable instability for 3 s.    Stand on one leg (left) Moderate: < 20 s   <1 second   Stand on one leg (right) Moderate: < 20 s   3-4 seconds   Stand on one leg - lowest score 1    Compensatory Stepping Correction - Forward Normal: Recovers independently with a single, large step (second realignement is allowed).    Compensatory Stepping Correction - Backward Normal: Recovers independently with a single, large step    Compensatory Stepping Correction - Left Lateral Moderate: Several steps to recover equilibrium   delayed 2 steps   Compensatory Stepping Correction - Right Lateral Normal: Recovers independently with 1 step (crossover or lateral OK)    Stepping Corredtion Lateral - lowest score 1    Stance - Feet together, eyes open, firm surface  Normal: 30s  Stance - Feet together, eyes closed, foam surface  Normal: 30s     Incline - Eyes Closed Normal: Stands independently 30s and aligns with gravity    Change in Gait Speed Moderate: Unable to change walking speed or signs of imbalance    Walk with head turns - Horizontal Moderate: performs head turns with reduction in gait speed.    Walk with pivot turns Moderate:Turns with feet close SLOW (>4 steps) with good balance.    Step over obstacles Moderate: Steps over box but touches box OR displays cautious behavior by slowing gait.    Timed UP & GO with Dual Task Moderate: Dual Task affects either counting OR walking (>10%) when compared to the TUG without Dual Task.    Mini-BEST total score 20      Timed Up and Go Test   Normal TUG (seconds) 15.78   no AD   Cognitive TUG (seconds) 22.44   no AD   TUG Comments >10% difference indicates high fall risk             PATIENT EDUCATION: Education details: See therapeutic activity section. Results of LTGs.  Person educated: Patient Education method: Explanation Education comprehension: verbalized understanding   HOME EXERCISE PROGRAM: Access Code: [email or web address] URL: [email or web address] Date: 12/03/2021 Prepared by: Mickie Bail Plaster  Exercises - Sit to Stand Without Arm Support  - 1 x daily - 7 x weekly - 3 sets - 10 reps - Step sideways with arms reaching at counter   - 1 x daily - 7 x weekly - 3 sets - 10 reps - Alternating Step Backward with Support  - 1 x daily - 7 x weekly - 3 sets - 10 reps Pt reports he is also performing with wide BOS and reaching up to target     GOALS: Goals reviewed with patient? Yes  LONG TERM GOALS: Target date: 01/23/2022  Pt will perform final HEP w/min A from family for improved strength, balance, transfers and gait.  Baseline:  Goal status: IN PROGRESS  2.  Pt will improve gait velocity to at least 3.2 ft/s w/LRAD for improved gait efficiency and safety  Baseline: 2.5 ft/s w/hurrycane  1.94 ft/sec with rollator on 02/06/22  Goal status: NOT  MET  3.  Pt will improve cog TUG to less than or equal to 15 seconds for improved functional mobility and decreased fall risk.  Baseline: 19.04s without AD   22.44 seconds with no AD on 02/06/22 Goal status: NOT MET  4.  Pt will improve MiniBest to 19/28 for decreased fall risk and improvement with compensatory stepping strategies.   Baseline: 17/28 on 11/1; 20/28 on 02/05/22 Goal status: MET  5.  Pt and family will verbalize understanding of local PD community resources, including fitness post DC.   Baseline:  Goal status: IN PROGRESS   UPDATED/ONGOING LTGS FOR RE-CERT LONG TERM GOALS: Target date: 03/08/22  Pt will perform final HEP w/ supervision from family for improved strength, balance, transfers and gait.  Baseline:  Goal status: IN PROGRESS  2.  Pt will improve gait velocity to at least 2.3 ft/s w/ rollator for improved gait efficiency and safety in the community  Baseline: 2.5 ft/s w/hurrycane, 1.94 ft/sec with rollator on 02/06/22  Goal status: REVISED  3.  Pt and family will verbalize understanding of local PD community resources, including fitness post DC.   Baseline:  Goal status: IN PROGRESS  ASSESSMENT:  CLINICAL IMPRESSION: Today's skilled session focused on assessing LTGs for re-cert. Pt  did not meet LTGs #2 and #3. Pt currently ambulating with a rollator for balance/safety and pt's gait speed was 1.94 ft/sec, indicating a limited community rollator (was previously 2.5 ft/sec with hurry cane). Performed cog TUG with no AD with pt performing in 22.44 seconds, indicating incr fall risk and pt challenged with cog dual tasking (previously was 19 seconds). Pt did improve miniBEST to a 20/28 (previously 17/28), which decr pt's fall risk and pt with improvement in stepping strategies for balance. LTGs #1 and #5 are in progress. Pt reporting feeling much better with his mobility and balance and is in agreement to D/C from PT at end of scheduled appts. Pt has been using  his rollator out in the community vs. His cane and pt feeling much more steady with this. Educated on using rollator even at home for balance and to decr fall risk. Pt will benefit from skilled PT to address gait, balance, functional mobility, transfers, safety and finalizing HEP for D/C. LTGs updated as appropriate.    OBJECTIVE IMPAIRMENTS Abnormal gait, decreased balance, decreased cognition, decreased coordination, decreased knowledge of condition, decreased knowledge of use of DME, decreased mobility, difficulty walking, decreased safety awareness, impaired perceived functional ability, impaired vision/preception, and improper body mechanics.   ACTIVITY LIMITATIONS carrying, lifting, bending, standing, squatting, stairs, transfers, dressing, reach over head, hygiene/grooming, locomotion level, and caring for others  PARTICIPATION LIMITATIONS: meal prep, cleaning, laundry, medication management, personal finances, interpersonal relationship, driving, shopping, community activity, occupation, and yard work  PERSONAL FACTORS Age, Education, Fitness, Past/current experiences, Time since onset of injury/illness/exacerbation, and 1 comorbidity: Lewy Body Dementia  are also affecting patient's functional outcome.   REHAB POTENTIAL: Fair due to poor prognosis of diagnosis   CLINICAL DECISION MAKING: Evolving/moderate complexity  EVALUATION COMPLEXITY: Moderate  PLAN: PT FREQUENCY: 2x/week  PT DURATION: 4 weeks  PLANNED INTERVENTIONS: Therapeutic exercises, Therapeutic activity, Neuromuscular re-education, Balance training, Gait training, Patient/Family education, Self Care, Joint mobilization, Stair training, Vestibular training, Canalith repositioning, DME instructions, Aquatic Therapy, Dry Needling, Electrical stimulation, Cryotherapy, Moist heat, Manual therapy, and Re-evaluation  PLAN FOR NEXT SESSION:  Review HEP and update as appropriate for balance. Work on [employer]. Lateral stepping.  SLS tasks, cone taps. Plan to D/C    Arliss Journey, PT, DPT 02/06/2022, 11:56 AM

## 2022-02-11 ENCOUNTER — Ambulatory Visit: Payer: PPO | Admitting: Speech Pathology

## 2022-02-11 ENCOUNTER — Ambulatory Visit: Payer: PPO | Admitting: Occupational Therapy

## 2022-02-11 ENCOUNTER — Encounter: Payer: Self-pay | Admitting: Occupational Therapy

## 2022-02-11 ENCOUNTER — Encounter: Payer: Self-pay | Admitting: Speech Pathology

## 2022-02-11 ENCOUNTER — Ambulatory Visit: Payer: PPO | Admitting: Physical Therapy

## 2022-02-11 DIAGNOSIS — R2681 Unsteadiness on feet: Secondary | ICD-10-CM

## 2022-02-11 DIAGNOSIS — R29818 Other symptoms and signs involving the nervous system: Secondary | ICD-10-CM

## 2022-02-11 DIAGNOSIS — R41840 Attention and concentration deficit: Secondary | ICD-10-CM

## 2022-02-11 DIAGNOSIS — M6281 Muscle weakness (generalized): Secondary | ICD-10-CM

## 2022-02-11 DIAGNOSIS — R471 Dysarthria and anarthria: Secondary | ICD-10-CM

## 2022-02-11 DIAGNOSIS — R41841 Cognitive communication deficit: Secondary | ICD-10-CM

## 2022-02-11 DIAGNOSIS — R2689 Other abnormalities of gait and mobility: Secondary | ICD-10-CM

## 2022-02-11 NOTE — Patient Instructions (Signed)
Bag Exercises:  Small trash bag or produce bag works best.  For all exercises, SIT with big posture (sit up tall with head up) and use big movements. Perform the following exercises 1 times per day.  Hold bag in both hands in front of you with hands/arms shoulder length apart. Move bag behind your head. Repeat 10 times. Hold bag in both hands in front of you with hands/arms shoulder length apart. Lift leg and move bag completely under each foot and back. Repeat 10 times on each side. Clap hands in front of you, and then clap hands behind your back   Tips for putting on pants:  Sit down Look for tag and make sure it's closest to you between your legs Put left leg in first  Tips for putting on jacket:  Stand with wide, staggered stance Tag facing you Rt arm over Rt sleeve, Lt arm goes under into Lt sleeve (criss crossed arms)  Then slide Lt arm in sleeve as Rt arm pulls collar over and around head

## 2022-02-11 NOTE — Therapy (Signed)
OUTPATIENT SPEECH LANGUAGE PATHOLOGY TREATMENT NOTE   Patient Name: Tony [name] MRN: [id number] DOB:1945/08/17, 76 y.o., male Today's Date: 02/11/2022  PCP: Tony Solian, MD REFERRING PROVIDER: Marcial Pacas, MD    End of Session - 02/11/22 1031     Visit Number 10    Number of Visits 17    Date for SLP Re-Evaluation 02/11/22    Authorization Type [employer]    SLP Start Time 1015    SLP Stop Time  1100    SLP Time Calculation (min) 45 min    Activity Tolerance Patient tolerated treatment well                 Past Medical History:  Diagnosis Date   Anxiety    Boil of buttock    High cholesterol    Imbalance    Low back pain    Parkinsons    Past Surgical History:  Procedure Laterality Date   CYST REMOVAL LEG  2014   Patient Active Problem List   Diagnosis Date Noted   History of falling 11/29/2021   Unsteadiness on feet 11/29/2021   Excessive sleepiness 11/08/2021   Cognitive impairment 12/25/2020   Anxiety 04/25/2020   Gait abnormality 03/30/2020   Parkinson's disease 03/30/2020   Memory loss 03/30/2020   Parkinsonism 03/30/2020   CELLULITIS AND ABSCESS OF FACE 02/01/2008   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/2006   HYPERLIPIDEMIA 06/30/2006   DIVERTICULOSIS, COLON 06/30/2006   SEBORRHEIC KERATOSIS 06/30/2006   BACK PAIN, LUMBAR 06/30/2006    ONSET DATE: 11/29/2021 referral  REFERRING DIAG: R26.81 (ICD-10-CM) - Unsteadiness on feet Z91.81 (ICD-10-CM) - History of falling G20.C (ICD-10-CM) - Parkinsonism, unspecified Parkinsonism type   THERAPY DIAG:  Dysarthria and anarthria  Cognitive communication deficit  Rationale for Evaluation and Treatment Rehabilitation  SUBJECTIVE:   SUBJECTIVE STATEMENT: "I have been practicing"  Tony [name] present today  PAIN:  Are you having pain? No   OBJECTIVE:   TODAY'S TREATMENT:     02/11/22: Tony [name] required 1 step cues to park walker and sit with safety precautions. He  entered room with sub WNL volume.  Targeted volume and intelligibility using Speak Out! Lesson 11. Pt required occasoinal verbal cues, modeling, for volume and breath support.   Pt averages the following volume levels:  Sustained AH: 92dB  Counting:81  Reading (phrases): 73dB  Cognitive Exercise: 68dB Required usual min to mod verbal cues, modeling for carryover of intent /volume generating 3 sentence descriptions for common objects and participating in 5 min simple conversation with range of 67 to 72dB. Ongoing review of caregiver supports to compensate for slow processing of verbal information and slow response time in conversations.    12/13/23[name], spouse present for caregiver training. I demonstrated and instructed on using short phrases with extended pauses, giving 1 direction at a time, using Keeny board to give written cues and intentionally bringing Tony [name] into a conversation. Tony [name] agreed that she gets frustrated when Tony [name] is slow to respond and this causes disagreements in the home. Educated how her expectation of faster processing and responding exacerbates Tony [name]'s processing difficulties and results in him not being able to respond. Explained and modeled giving Tony [name] extra time to answer a question, and how when he doesn't respond right a way, asking another question or follow up question makes his processing even more difficult. They Id'd that bill paying was resulting in communication breakdown because Tony [name] could not explain to Tony [name] how to pay a bill and the  steps to take to access online, auto payments and phone payments. I provided a log of bills, due dates, method of payment, amount and passwords. They will work with their daughter, Tony [name] who helps with bills, to fill out the chart to support Tony [name]'s communication and processing as he instructs Tony [name] on bill paying. Speak Out completed (modified to 3 reps of warm ups, 5 sentences and 3 cognitive activities due to time, so Tony [name] could hear how  loud he is supposed to be. They agree he is not completing Speak Out as loud at home. I demonstrated cueing - 1 cue, with a pause for cognitive activity of naming 3 items in a category. They reports they attempted to go to game night at church, but this was too much for Tony [name]. I encouraged them to have game night at home with no timers, slow pace and short cues for Tony [name] to allow him to participate in game night as he enjoys this. Provided list of cognitive activities for game night . See pt instructions.   01/30/22: Tony [name], Tony [name]'s daughter attended session today. Session focused on family and environmental strategies to support Tony [name]'s processing, attention and communication. Tony [name] initially came in and was given multiple directions on how to sit, where to put his walker and where to sit. Tony [name] became confused and used unsafe sitting decisions. This was used as an example to provide Tony [name] 1 instruction at a time, pause, allowing him to complete 1 step, then giving the next direction. I provided written steps for safe sitting and standing for Tony [name] to read and process at his own speed. I also demonstrated use of Sheer board to support Tony [name] in processing directions, topics, choices, and schedule changes. We reviewed Monday's example of Tony [name] receiving a phone call that PT was cancelled, the fronto office understood that Tony [name] also wanted to cancel ST, his ST appointment was cancelled, then he showed up for the Post appointment. I demonstrated to  Tony [name] and Tony [name] how writing this info down for him can help communication breakdowns. I Demonstrated extra pauses and use of gestures in conversation, explaining to both of them that it feels  unnatural, but pauses give Tony [name] a chance to understand and process conversation better. We demonstrated how giving a pause helped Tony [name] answer questions accurately, rather than immediately rephrasing the question, which results in breakdown of processing. Tony [name] hit targets in [place] and Tony [name]  endorse he is not as loud at home. Tony [name] aware to cue his volume at home. Demonstrated Sound level meter, 90 dB vs 70dB and showed Tony [name] the targets for each exercise. Tony [name] maintained 70-72dB in short conversation re: [place] football and the play offs with rare min A, with extended time. Encouraged Tony [name] to share the handout (See patient instructions) with her family. Also educated Tony [name] and Tony [name] how flat affect and monotone speech can negatively affect a listeners perception of Tony [name]'s interest and desire to communicate. Encouraged Tony [name] to self advocate when he is not understanding conversation or information.     01/28/22: Tony [name] reports he is processing slowly this morning. He had questions about cognitive exercise - Lesson 8 - we completed Lesson 8, after initial verbal cues and modeling Tony [name] completed cognitive exercise with rare min verbal cues.    Targeted volume and intelligibility using Speak Out! Lesson 8. Pt required occasional min verbal cues, modeling, for volume and breath support.   Pt averages the following volume levels:  Sustained AH: 94 dB  Counting: 80  Reading (phrases): 73  Cognitive Exercise: 72 Required occasional min verbal cues, modeling for carryover of intent /volume generating 2 sentences for 10 multiple meaning words. He required verbal cues to carryover this level of volume and intent in conversation. Educated it is normal to feel too loud when he starts to hit 70+dB. He benefited from verbal cues and modeling for breath support for volume and intent. Requested his daughters attend ST for partner training in compensations and supports for slow processing to help Tony [name] participate in conversation with family   01-23-22: Tony [name] did not understand cognitive exercise Lesson 9 - name 3-5 items in a category - After this was explained, he named 5 items in 4 categories with extended time and occasional semantic cues. He stated "I really struggle with this" Targeted volume using Speak Out! Loud Ah  90dB, Reading 75dB, cognitive exercise (resumed categorization after voice warm ups) 70dB with usual verbal and visual cues for volume. Trained in strategies to maximize his participation in family conversations including family education and awareness of flat affect and monotone speech.  See patient instructions.    PATIENT EDUCATION: Education details: see above Person educated: Patient and Spouse Education method: Customer service manager Education comprehension: verbalized understanding, returned demonstration, and needs further education  HOME EXERCISE PROGRAM: Speak Out  Speech Therapy Progress Note  Dates of Reporting Period: 12/17/21 to 02/11/22  Objective Reports of Subjective Statement: Volume improving in ST, ongoing cues to cues and requests for repeats to carryover volume at home and community  Objective Measurements: See treatment for  current dB levels  Goal Update: continue all LTGs  Plan: Continue POC  Reason Skilled Services are Required: Tony [name] is progressing with achieving dB targets in Speak Out! Program during [place], however he continues to require min to mod A to carryover volume in conversation in sessions and at home. Ongoing training of how family can support Tony [name]'s slow processing to enable him to remain an active participant in conversations and communicating at home and in the community.    GOALS: Goals reviewed with patient? Yes  SHORT TERM GOALS: Target date: 01/14/2022  Pt will complete clinical swallow evaluation (CSE) to assess need for objective swallow evaluation  Baseline: 12/24/2021 Goal status: MET  2.  Pt will complete instrumental swallow evaluation, if indicated per CSE to objectively assess swallow function and efficacy Baseline:  Goal status: deferred d/t CSE results  3.  Pt will demonstrate communication strategies to aid in attention, processing, and coherent discourse in 15 minute conversation with occasional min-A Baseline:  Goal  status: MET  4.  Pt will meet dB targets for warm up and counting Speak Out exercises with rare min-A over 1 week period Baseline:  Goal status: MET  5.  Pt's care partner will teach back strategies and compensations to aid in pt's improved safety whilst eating meals at home over 1 week period Baseline:  Goal status: deferred   LONG TERM GOALS: Target date: 3/33/83 - (recert completed 29/19/16)  Pt will report daily HEP adherence for dysphagia and dysarthria with min-A from care partner over 1 week period Baseline:  Goal status: ONGOING  2.  Pt's average volume will exceed 68 dB in answering simple, 10 minute conversation, such as answering personally relevant questions with occasional min-A over 2 sessions Baseline:  Goal status: ONGOING  3.  Pt will demonstrate saliva management techniques with compensations PRN and rare verbal cues over 30 minutes Baseline:  Goal status: ONGOING  4.  Pt's care  partner will report carryover of strategies and compensations to aid in improved safety whilst eating meals at home and subjective report of reduced instances of coughing during meal times over 1 week period Baseline:  Goal status: ONGOING  5.  Pt's volume will average greater than 70 dB during Speak Out reading and cognitive exercises with occasional min-A over 2 sessions  Baseline:  Goal status: ONGOING   ASSESSMENT:  CLINICAL IMPRESSION: Patient is a 76 y.o. 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 Pt presents with mild dysarthria, cognitive impairment, and dysphagia. arily by reduced volume and imprecise articulation. Ongoing training in HEP for dysarthria, compensations for intelligibility, attention, processing, and participation in conversations. Family/caregiver training to support communication and cognition given to spouse and 1 daughter, Tony [name]. I am requesting his  other daughter, Tony [name], attend 1-2 sessions for training on providing supports to help Tony [name] participate in conversations due to slow processing and to cue him for volume in HEP and conversation while reducing the cognitive load of processing when interrupted to speak louder. He is hitting the targeted dB in HEP (speak out!) in ST sessions with occasional min A, however he is not hitting dB targets at home. Tony [name] continues to report difficulty hearing Tony [name] at home. Continued Skilled ST is indicated to maximize intelligibility, cognition for safety, reduce caregiver burden and independence . Continue LTG's, recert completed today (02/11/22) through 03/22/21.  OBJECTIVE IMPAIRMENTS: Objective impairments include attention, memory, executive functioning, expressive language, dysarthria, and dysphagia. These impairments are limiting patient from managing medications, managing appointments, managing finances, household responsibilities, ADLs/IADLs, effectively communicating at home and in community, and safety when swallowing.Factors affecting potential to achieve goals and functional outcome are ability to learn/carryover information and medical prognosis.. Patient will benefit from skilled SLP services to address above impairments and improve overall function.  REHAB POTENTIAL: Fair (degenerative dx)  PLAN:  SLP FREQUENCY: 2x/week  SLP DURATION: 8 weeks  PLANNED INTERVENTIONS: Aspiration precaution training, Pharyngeal strengthening exercises, Diet toleration management , Language facilitation, Environmental controls, Trials of upgraded texture/liquids, Cueing hierachy, Cognitive reorganization, Internal/external aids, Functional tasks, SLP instruction and feedback, Compensatory strategies, Patient/family education, and Re-evaluation    Siaosi Alter, Annye Rusk, CCC-SLP 02/11/2022, 12:22 PM

## 2022-02-11 NOTE — Therapy (Signed)
OUTPATIENT PHYSICAL THERAPY NEURO TREATMENT  Patient Name: Tony [name] MRN: [id number] DOB:04-02-1945, 76 y.o., male Today's Date: 02/11/2022   PCP: Prince Solian, MD  REFERRING PROVIDER: Marcial Pacas, MD    PT End of Session - 02/11/22 0936     Visit Number 17    Number of Visits 20   Plus eval   Date for PT Re-Evaluation 57/32/20   re-cert   Authorization Type [employer]    PT Start Time 0933    PT Stop Time 1015    PT Time Calculation (min) 42 min    Equipment Utilized During Treatment Gait belt    Activity Tolerance Patient tolerated treatment well    Behavior During Therapy WFL for tasks assessed/performed                   Past Medical History:  Diagnosis Date   Anxiety    Boil of buttock    High cholesterol    Imbalance    Low back pain    Parkinsons    Past Surgical History:  Procedure Laterality Date   CYST REMOVAL LEG  2014   Patient Active Problem List   Diagnosis Date Noted   History of falling 11/29/2021   Unsteadiness on feet 11/29/2021   Excessive sleepiness 11/08/2021   Cognitive impairment 12/25/2020   Anxiety 04/25/2020   Gait abnormality 03/30/2020   Parkinson's disease 03/30/2020   Memory loss 03/30/2020   Parkinsonism 03/30/2020   CELLULITIS AND ABSCESS OF FACE 02/01/2008   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/2006   HYPERLIPIDEMIA 06/30/2006   DIVERTICULOSIS, COLON 06/30/2006   SEBORRHEIC KERATOSIS 06/30/2006   BACK PAIN, LUMBAR 06/30/2006    ONSET DATE: 11/08/2021   REFERRING DIAG: G20 (ICD-10-CM) - Parkinson's disease R41.89 (ICD-10-CM) - Cognitive impairment F41.9 (ICD-10-CM) - Anxiety R26.9 (ICD-10-CM) - Gait abnormality G47.10 (ICD-10-CM) - Excessive sleepiness   THERAPY DIAG:  Unsteadiness on feet  Other symptoms and signs involving the nervous system  Other abnormalities of gait and mobility  Muscle weakness (generalized)  Rationale for Evaluation and Treatment  Rehabilitation  SUBJECTIVE:                                                                                                                                                                                              SUBJECTIVE STATEMENT: Had a stumble/fall last night. Was picking up a piece of paper off the ground, was starting to grab the coffee table and the coffee table turned over. Did not hurt himself. Was able to get over on his knees and turn over and stand  up. Was not using his rollator and reports that he was distracted. Made some adjustments to his shower and will now be in a shower chair.   Pt accompanied by: Daughter [name]   PERTINENT HISTORY: DaTSCAN in July 2022 showed bilateral decreased radioactive tracing in the putamen and asymmetric decreased radiotracer activity in the head of the right caudate nucleus His cognitive impairment progress quickly compared to the idiopathic typical Parkinson's patient, he seems to have less optimal response to Sinemet, differentiation diagnosis including idiopathic Parkinson's disease with cognitive impairment versus Lewy body dementia  PAIN:  Are you having pain? No  PRECAUTIONS: Fall  WEIGHT BEARING RESTRICTIONS No  FALLS: Has patient fallen in last 6 months? Yes. Number of falls ~6 or more   PLOF: Independent  PATIENT GOALS "Work on my dexterity"    OBJECTIVE:   DIAGNOSTIC FINDINGS: DaTSCAN in July 2022 showed bilateral decreased radioactive tracing in the putamen and asymmetric decreased radiotracer activity in the head of the right caudate nucleus   COGNITION: Overall cognitive status: Impaired Noted saccadic intrusions of L eye during eval   POSTURE: rounded shoulders, forward head, and flexed trunk    TODAY'S TREATMENT:  Ther Act   Discussed fall prevention in the home due to recent fall/stumble; discussed importance of use of rollator at all times (pt is going to get his old rollator back and have one in the house  and one in the car for community distances), importance of minimizing distractions as pt was not paying attention/focusing on the TV when he fell, using a reacher to grab things from the floor vs. Bending over or asking his spouse to do it for him.  Reviewed safety with rollator: staying close to rollator during gait (keeping feet under the seat and standing up tall), turning all the way with rollator to mat table and locking brakes before sitting down and to not hold onto rollator when standing and to press up from the mat table.   NMR:  Access Code: [email or web address] URL: [email or web address] Date: 02/11/2022 Prepared by: Janann August  Reviewed/updated HEP as appropriate. See [employer]Bridge for further details. Provided new handout and wrote down reminder cues for proper technique with exercises.   Exercises - Sit to Stand Without Arm Support  - 1 x daily - 7 x weekly - 2 sets - 10 reps - cued for incr forward lean   - Step sideways with arms reaching at counter   - 1 x daily - 7 x weekly - 1-2 sets - 10 reps - multi-modal cues to focus on the stepping part of the exercise vs. The reach. Pt with tendency initially to just reach and not step. Cued to also reach out rather than up as pt reporting reaching up made his shoulder sore   - Alternating Step Backward with Support  - 1 x daily - 7 x weekly - 1-2 sets - 10 reps - cued for step length   - Side to Side Weight Shift with Overhead Reach and Counter Support  - 1 x daily - 7 x weekly - 1-2 sets - 10 reps- cued to keep feet in place and wide BOS and to look up at hands when reaching   - Standing Single Leg Stance with Counter Support  - 1 x daily - 7 x weekly - 3 sets - 10 hold - holding for 10 seconds, pt more challenged with lifting up L leg    PATIENT EDUCATION: Education details: See therapeutic activity section.  Review  of LTGs/updates  Person educated: Patient Education method: Explanation, Demonstration, and  Handouts Education comprehension: verbalized understanding, returned demonstration, verbal cues required, tactile cues required, and needs further education   HOME EXERCISE PROGRAM: Access Code: [email or web address] URL: [email or web address] Date: 02/11/2022 Prepared by: Janann August  Exercises - Sit to Stand Without Arm Support  - 1 x daily - 7 x weekly - 2 sets - 10 reps - Step sideways with arms reaching at counter   - 1 x daily - 7 x weekly - 1-2 sets - 10 reps - Alternating Step Backward with Support  - 1 x daily - 7 x weekly - 1-2 sets - 10 reps - Side to Side Weight Shift with Overhead Reach and Counter Support  - 1 x daily - 7 x weekly - 1-2 sets - 10 reps - Standing Single Leg Stance with Counter Support  - 1 x daily - 7 x weekly - 3 sets - 10 hold    GOALS: Goals reviewed with patient? Yes    UPDATED/ONGOING LTGS FOR RE-CERT LONG TERM GOALS: Target date: 03/08/22  Pt will perform final HEP w/ supervision from family for improved strength, balance, transfers and gait.  Baseline:  Goal status: IN PROGRESS  2.  Pt will improve gait velocity to at least 2.3 ft/s w/ rollator for improved gait efficiency and safety in the community  Baseline: 2.5 ft/s w/hurrycane, 1.94 ft/sec with rollator on 02/06/22  Goal status: REVISED  3.  Pt and family will verbalize understanding of local PD community resources, including fitness post DC.   Baseline:  Goal status: IN PROGRESS  ASSESSMENT:  CLINICAL IMPRESSION: Today's skilled session focused on reviewing fall prevention due to pt's recent fall last night. Pt plans on use of rollator at all times in the house and in the community (will keep his old one in the house and use the new one in the community). Remainder of session focused on reviewing technique and going over HEP. Pt needing multi-modal cues for proper technique with lateral step and reach as pt initially just performing as a reach, pt did improve with incr reps,  but did better with visual cues on how to perform. LTGs updated as appropriate.    OBJECTIVE IMPAIRMENTS Abnormal gait, decreased balance, decreased cognition, decreased coordination, decreased knowledge of condition, decreased knowledge of use of DME, decreased mobility, difficulty walking, decreased safety awareness, impaired perceived functional ability, impaired vision/preception, and improper body mechanics.   ACTIVITY LIMITATIONS carrying, lifting, bending, standing, squatting, stairs, transfers, dressing, reach over head, hygiene/grooming, locomotion level, and caring for others  PARTICIPATION LIMITATIONS: meal prep, cleaning, laundry, medication management, personal finances, interpersonal relationship, driving, shopping, community activity, occupation, and yard work  PERSONAL FACTORS Age, Education, Fitness, Past/current experiences, Time since onset of injury/illness/exacerbation, and 1 comorbidity: Lewy Body Dementia  are also affecting patient's functional outcome.   REHAB POTENTIAL: Fair due to poor prognosis of diagnosis   CLINICAL DECISION MAKING: Evolving/moderate complexity  EVALUATION COMPLEXITY: Moderate  PLAN: PT FREQUENCY: 2x/week  PT DURATION: 4 weeks  PLANNED INTERVENTIONS: Therapeutic exercises, Therapeutic activity, Neuromuscular re-education, Balance training, Gait training, Patient/Family education, Self Care, Joint mobilization, Stair training, Vestibular training, Canalith repositioning, DME instructions, Aquatic Therapy, Dry Needling, Electrical stimulation, Cryotherapy, Moist heat, Manual therapy, and Re-evaluation  PLAN FOR NEXT SESSION:  Work on [employer]. Lateral stepping. SLS tasks, cone taps. Plan to D/C at end of current scheduled appts     Arliss Journey, PT, DPT 02/11/2022, 12:04 PM

## 2022-02-11 NOTE — Therapy (Signed)
OUTPATIENT OCCUPATIONAL THERAPY PARKINSON'S TREATMENT  Patient Name: Tony [name] MRN: [id number] DOB:08/11/1945, 76 y.o., male Today's Date: 02/11/2022  PCP: Dr. Steva Ready REFERRING PROVIDER: Dr. Krista Blue   OT End of Session - 02/11/22 0854     Visit Number 3    Number of Visits 25    Date for OT Re-Evaluation 04/03/22    Authorization Type [employer]    Authorization - Visit Number 3    Authorization - Number of Visits 10    Progress Note Due on Visit 10    OT Start Time 0850    OT Stop Time 0930    OT Time Calculation (min) 40 min    Equipment Utilized During Treatment Goes by [name]    Activity Tolerance Patient tolerated treatment well    Behavior During Therapy [facility] for tasks assessed/performed             Past Medical History:  Diagnosis Date   Anxiety    Boil of buttock    High cholesterol    Imbalance    Low back pain    Parkinsons    Past Surgical History:  Procedure Laterality Date   CYST REMOVAL LEG  2014   Patient Active Problem List   Diagnosis Date Noted   History of falling 11/29/2021   Unsteadiness on feet 11/29/2021   Excessive sleepiness 11/08/2021   Cognitive impairment 12/25/2020   Anxiety 04/25/2020   Gait abnormality 03/30/2020   Parkinson's disease 03/30/2020   Memory loss 03/30/2020   Parkinsonism 03/30/2020   CELLULITIS AND ABSCESS OF FACE 02/01/2008   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/2006   HYPERLIPIDEMIA 06/30/2006   DIVERTICULOSIS, COLON 06/30/2006   SEBORRHEIC KERATOSIS 06/30/2006   BACK PAIN, LUMBAR 06/30/2006    ONSET DATE: 11/29/21  REFERRING DIAG:  Diagnosis  R26.81 (ICD-10-CM) - Unsteadiness on feet  Z91.81 (ICD-10-CM) - History of falling  G20.C (ICD-10-CM) - Parkinsonism, unspecified Parkinsonism type    THERAPY DIAG:  Unsteadiness on feet  Muscle weakness (generalized)  Attention and concentration deficit  Other symptoms and signs involving the nervous system  Rationale  for Evaluation and Treatment: Rehabilitation  SUBJECTIVE:   SUBJECTIVE STATEMENT: Wife reports pt fell last night Goes by [name] Pt accompanied by: wife  PERTINENT HISTORY:  per Dr. Krista Blue [employer] in July 2022 showed bilateral decreased radioactive tracing in the putamen and asymmetric decreased radiotracer activity in the head of the right caudate nucleus His cognitive impairment progress quickly compared to the idiopathic typical Parkinson's patient, he seems to have less optimal response to Sinemet, differentiation diagnosis including idiopathic Parkinson's disease with cognitive impairment versus Lewy body dementia. PMH: anxiety, hyperlipidemia, back pain  PRECAUTIONS: Fall  WEIGHT BEARING RESTRICTIONS: No  PAIN:  Are you having pain? No  FALLS: Has patient fallen in last 6 months? Yes. Number of falls 5  LIVING ENVIRONMENT: Lives with: lives with their spouse, dtr's assist Lives in: [place] Stairs: No Has following equipment at home:  rollator  PLOF: Independent  PATIENT GOALS: increased ease with dressing and fine motor coordination  OBJECTIVE:   HAND DOMINANCE: Right  ADLs: Overall ADLs: Pt 's dtr's assist Transfers/ambulation related to ADLs: Eating: increased spills needs assist with cutting food Grooming: mod I UB Dressing:  able to donn shirt with setup, difficulty donning/ doffing jacket LB Dressing: min A pants/ socks , uses slip on shoes,  Toileting: mod I with rollator  Bathing: distant supervision Tub Shower transfers:stepping into shower /tub needs supervision, has grab  bar   IADLs: Goes to PD cycle on Tues/ Thurs Handwriting: 100% legible  MOBILITY STATUS: Hx of falls, start hesitation, and uses rollator  POSTURE COMMENTS:  rounded shoulders and forward head   FUNCTIONAL OUTCOME MEASURES: Fastening/unfastening 3 buttons: unable Physical performance test: PPT#2 (simulated eating) 28.65 & PPT#4 (donning/doffing jacket): NT 01/30/22: PPT#4 = 1 min. 22  sec  COORDINATION: 9 Hole Peg test: Right: 69.57 sec; Left: 2 mins 20  sec Box and Blocks:  Right 21 blocks, Left 11blocks Resting tremor LUE UE ROM:   shoulder flexion: RUE 130. LUE 125 elbow flexion/ extension WFLS   UE MMT:   Not tested  SENSATION: Not tested  Vision:not formally tested, TBA in functional context  COGNITION: Overall cognitive status: Impaired  OBSERVATIONS: Bradykinesia   TODAY'S TREATMENT:                                                                                                                              Reviewed safety/fall prevention including sitting for showering and donning pants over feet, safety w/ rollator during transfers. Discussed wide, staggered stance with donning jacket. Pt practiced donning/doffing jacket with strategies - see pt instructions Practiced donning/doffing pants over feet while seated w/ strategies - see pt instructions. Wife present for all education - stressed importance of consistent simple cueing w/ all caregivers.   Pt issued bag exercises for overhead/behind head, and under feet in prep for dressing. Had to modify one behind back d/t apraxia/cognitive deficits - see pt instructions for details  PATIENT EDUCATION: See above (wife also present for education and verbalized understanding, however pt will need reinforcement)   HOME EXERCISE PROGRAM: 02/11/22: bag exercises, strategies for donning/doffing jacket, donning/doffing pants  GOALS:  Goals reviewed with patient? Yes  SHORT TERM GOALS: Target date: 02/06/22  I with PD specific HEP Baseline: Goal status: in progress  2.  Pt will verbalize understanding of adapted strategies to maximize safety and I with ADLs/ [place] to minimize fall risk . Baseline:  Goal status: in progress  3.  Pt will demonstrate improved ease with feeding as evidenced by decreasing PPT#2 (self feeding) by 3 secs Baseline:  Goal status: INITIAL  4.  Pt will demonstrate improved UE  functional use as evidenced by increasing box and blocks score by 4 blocks bilaterally. Baseline:  Right 21 blocks, Left 11 blocks Goal status: INITIAL  5.    Improve PPT#4 to under 1 minute Baseline: 1 min. 22 sec Goal status: in progress    LONG TERM GOALS: Target date: 04/03/22  Pt will verbalize understanding of ways to prevent future  PD related complications and PD community resources. Baseline:  Goal status: INITIAL  2. Pt will demonstrate improved fine motor coordination for ADLs as evidenced by decreasing 9 hole peg test score for RUE by 4 secs Baseline: 9 Hole Peg test: Right: 69.57 sec; Left: 2 mins 20  sec Goal status: INITIAL  3.  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 Will need continued reinforcement of all ADL strategies  PERFORMANCE DEFICITS: in functional skills including ADLs, IADLs, coordination, dexterity, strength, flexibility, Fine motor control, Gross motor control, mobility, balance, endurance, decreased knowledge of precautions, decreased knowledge of use of DME, and UE functional use, cognitive skills including memory, problem solving, safety awareness, and thought, and psychosocial skills including coping strategies, environmental adaptation, habits, interpersonal interactions, and routines and behaviors.   IMPAIRMENTS: are limiting patient from ADLs, IADLs, play, leisure, and social participation.   COMORBIDITIES:  may have co-morbidities  that affects occupational performance. Patient will benefit from skilled OT to  address above impairments and improve overall function.  MODIFICATION OR ASSISTANCE TO COMPLETE EVALUATION: No modification of tasks or assist necessary to complete an evaluation.  OT OCCUPATIONAL PROFILE AND HISTORY: Detailed assessment: Review of records and additional review of physical, cognitive, psychosocial history related to current functional performance.  CLINICAL DECISION MAKING: LOW - limited treatment options, no task modification necessary  REHAB POTENTIAL: Good  EVALUATION COMPLEXITY: Low    PLAN:  OT FREQUENCY: 2x/week  OT DURATION: 12 weeks plus eval to account for scheduling  PLANNED INTERVENTIONS: self care/ADL training, therapeutic exercise, therapeutic activity, neuromuscular re-education, manual therapy, passive range of motion, gait training, balance training, functional mobility training, aquatic therapy, splinting, ultrasound, paraffin, fluidotherapy, moist heat, cryotherapy, patient/family education, cognitive remediation/compensation, visual/perceptual remediation/compensation, energy conservation, coping strategies training, DME and/or AE instructions, and Re-evaluation  RECOMMENDED OTHER SERVICES: PT, ST  CONSULTED AND AGREED WITH PLAN OF CARE: Patient and family member/caregiver  PLAN FOR NEXT SESSION: simple coordination HEP (4 ex's or less), reinforce ADLS and fall prevention   Hans Eden, OT 02/11/2022, 8:56 AM

## 2022-02-13 ENCOUNTER — Ambulatory Visit: Payer: PPO | Admitting: Speech Pathology

## 2022-02-13 ENCOUNTER — Encounter: Payer: Self-pay | Admitting: Physical Therapy

## 2022-02-13 ENCOUNTER — Ambulatory Visit: Payer: PPO | Admitting: Occupational Therapy

## 2022-02-13 ENCOUNTER — Encounter: Payer: Self-pay | Admitting: Speech Pathology

## 2022-02-13 ENCOUNTER — Ambulatory Visit: Payer: PPO | Admitting: Physical Therapy

## 2022-02-13 DIAGNOSIS — R29818 Other symptoms and signs involving the nervous system: Secondary | ICD-10-CM

## 2022-02-13 DIAGNOSIS — M6281 Muscle weakness (generalized): Secondary | ICD-10-CM

## 2022-02-13 DIAGNOSIS — R41844 Frontal lobe and executive function deficit: Secondary | ICD-10-CM

## 2022-02-13 DIAGNOSIS — R471 Dysarthria and anarthria: Secondary | ICD-10-CM

## 2022-02-13 DIAGNOSIS — R2689 Other abnormalities of gait and mobility: Secondary | ICD-10-CM

## 2022-02-13 DIAGNOSIS — R41840 Attention and concentration deficit: Secondary | ICD-10-CM

## 2022-02-13 DIAGNOSIS — R2681 Unsteadiness on feet: Secondary | ICD-10-CM

## 2022-02-13 DIAGNOSIS — R41841 Cognitive communication deficit: Secondary | ICD-10-CM

## 2022-02-13 DIAGNOSIS — R278 Other lack of coordination: Secondary | ICD-10-CM

## 2022-02-13 NOTE — Therapy (Signed)
OUTPATIENT SPEECH LANGUAGE PATHOLOGY TREATMENT NOTE   Patient Name: Tony [name] MRN: [id number] DOB:1945/07/11, 76 y.o., male Today's Date: 02/13/2022  PCP: Tony Solian, MD REFERRING PROVIDER: Marcial Pacas, MD    End of Session - 02/13/22 0936     Visit Number 11    Number of Visits 17    Date for SLP Re-Evaluation 03/22/22    Authorization Type [employer]    SLP Start Time 0930    SLP Stop Time  1015    SLP Time Calculation (min) 45 min    Activity Tolerance Patient tolerated treatment well                 Past Medical History:  Diagnosis Date   Anxiety    Boil of buttock    High cholesterol    Imbalance    Low back pain    Parkinsons    Past Surgical History:  Procedure Laterality Date   CYST REMOVAL LEG  2014   Patient Active Problem List   Diagnosis Date Noted   History of falling 11/29/2021   Unsteadiness on feet 11/29/2021   Excessive sleepiness 11/08/2021   Cognitive impairment 12/25/2020   Anxiety 04/25/2020   Gait abnormality 03/30/2020   Parkinson's disease 03/30/2020   Memory loss 03/30/2020   Parkinsonism 03/30/2020   CELLULITIS AND ABSCESS OF FACE 02/01/2008   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   THALASSEMIA [place] 09/12/2006   HYPERLIPIDEMIA 06/30/2006   DIVERTICULOSIS, COLON 06/30/2006   SEBORRHEIC KERATOSIS 06/30/2006   BACK PAIN, LUMBAR 06/30/2006    ONSET DATE: 11/29/2021 referral  REFERRING DIAG: R26.81 (ICD-10-CM) - Unsteadiness on feet Z91.81 (ICD-10-CM) - History of falling G20.C (ICD-10-CM) - Parkinsonism, unspecified Parkinsonism type   THERAPY DIAG:  Dysarthria and anarthria  Cognitive communication deficit  Rationale for Evaluation and Treatment Rehabilitation  SUBJECTIVE:   SUBJECTIVE STATEMENT: "I have been practicing"  Tony [name] present today  PAIN:  Are you having pain? No   OBJECTIVE:   TODAY'S TREATMENT:     02/13/22: Tony [name] enters room with sub WNL volume at 65dB average.   Targeted volume and  intelligibility using Speak Out! Lesson 12. Pt required inital verbal cues, modeling, for volume and breath support, faded to rare min visual cues for cognitive exercise.   Pt averages the following volume levels:  Sustained AH: 92dB  Counting:88dB  Reading (phrases): 75dB  Cognitive Exercise: 70dB Required occasional verbal cues, modeling for carryover of intent /volume during 15 minute conversation.    02/11/22: Tony [name] required 1 step cues to park walker and sit with safety precautions. He entered room with sub WNL volume.  Targeted volume and intelligibility using Speak Out! Lesson 11. Pt required occasoinal verbal cues, modeling, for volume and breath support.   Pt averages the following volume levels:  Sustained AH: 92dB  Counting:81 dB  Reading (short story): 73dB  Cognitive Exercise: 68dB Required usual min to mod verbal cues, modeling for carryover of intent /volume generating 3 sentence descriptions for common objects and participating in 5 min simple conversation with range of 67 to 72dB. Ongoing review of caregiver supports to compensate for slow processing of verbal information and slow response time in conversations.    12/13/23[name], spouse present for caregiver training. I demonstrated and instructed on using short phrases with extended pauses, giving 1 direction at a time, using Anaya board to give written cues and intentionally bringing Tony [name] into a conversation. Tony [name] agreed that she gets frustrated when Tony [name] is slow to respond and this causes  disagreements in the home. Educated how her expectation of faster processing and responding exacerbates Tony [name]'s processing difficulties and results in him not being able to respond. Explained and modeled giving Tony [name] extra time to answer a question, and how when he doesn't respond right a way, asking another question or follow up question makes his processing even more difficult. They Id'd that bill paying was resulting in communication breakdown  because Tony [name] could not explain to Tony [name] how to pay a bill and the steps to take to access online, auto payments and phone payments. I provided a log of bills, due dates, method of payment, amount and passwords. They will work with their daughter, Tony [name] who helps with bills, to fill out the chart to support Tony [name]'s communication and processing as he instructs Tony [name] on bill paying. Speak Out completed (modified to 3 reps of warm ups, 5 sentences and 3 cognitive activities due to time, so Tony [name] could hear how loud he is supposed to be. They agree he is not completing Speak Out as loud at home. I demonstrated cueing - 1 cue, with a pause for cognitive activity of naming 3 items in a category. They reports they attempted to go to game night at church, but this was too much for Tony [name]. I encouraged them to have game night at home with no timers, slow pace and short cues for Tony [name] to allow him to participate in game night as he enjoys this. Provided list of cognitive activities for game night . See pt instructions.   01/30/22: Tony [name], Tony [name]'s daughter attended session today. Session focused on family and environmental strategies to support Tony [name]'s processing, attention and communication. Tony [name] initially came in and was given multiple directions on how to sit, where to put his walker and where to sit. Tony [name] became confused and used unsafe sitting decisions. This was used as an example to provide Tony [name] 1 instruction at a time, pause, allowing him to complete 1 step, then giving the next direction. I provided written steps for safe sitting and standing for Tony [name] to read and process at his own speed. I also demonstrated use of [place] board to support Tony [name] in processing directions, topics, choices, and schedule changes. We reviewed Monday's example of Tony [name] receiving a phone call that PT was cancelled, the fronto office understood that Tony [name] also wanted to cancel ST, his ST appointment was cancelled, then he showed up for the Story appointment. I  demonstrated to  Tony [name] and Tony [name] how writing this info down for him can help communication breakdowns. I Demonstrated extra pauses and use of gestures in conversation, explaining to both of them that it feels  unnatural, but pauses give Tony [name] a chance to understand and process conversation better. We demonstrated how giving a pause helped Tony [name] answer questions accurately, rather than immediately rephrasing the question, which results in breakdown of processing. Tony [name] hit targets in [place] and Tony [name] endorse he is not as loud at home. Tony [name] aware to cue his volume at home. Demonstrated Sound level meter, 90 dB vs 70dB and showed Tony [name] the targets for each exercise. Tony [name] maintained 70-72dB in short conversation re: [place] football and the play offs with rare min A, with extended time. Encouraged Tony [name] to share the handout (See patient instructions) with her family. Also educated Tony [name] and Tony [name] how flat affect and monotone speech can negatively affect a listeners perception of Tony [name]'s interest and desire to communicate. Encouraged Tony [name] to self advocate when he is not understanding conversation or information.  01/28/22: Tony [name] reports he is processing slowly this morning. He had questions about cognitive exercise - Lesson 8 - we completed Lesson 8, after initial verbal cues and modeling Tony [name] completed cognitive exercise with rare min verbal cues.    Targeted volume and intelligibility using Speak Out! Lesson 8. Pt required occasional min verbal cues, modeling, for volume and breath support.   Pt averages the following volume levels:  Sustained AH: 94 dB  Counting: 80  Reading (phrases): 73  Cognitive Exercise: 72 Required occasional min verbal cues, modeling for carryover of intent /volume generating 2 sentences for 10 multiple meaning words. He required verbal cues to carryover this level of volume and intent in conversation. Educated it is normal to feel too loud when he starts to hit 70+dB. He benefited  from verbal cues and modeling for breath support for volume and intent. Requested his daughters attend ST for partner training in compensations and supports for slow processing to help Tony [name] participate in conversation with family   01-23-22: Tony [name] did not understand cognitive exercise Lesson 9 - name 3-5 items in a category - After this was explained, he named 5 items in 4 categories with extended time and occasional semantic cues. He stated "I really struggle with this" Targeted volume using Speak Out! Loud Ah 90dB, Reading 75dB, cognitive exercise (resumed categorization after voice warm ups) 70dB with usual verbal and visual cues for volume. Trained in strategies to maximize his participation in family conversations including family education and awareness of flat affect and monotone speech.  See patient instructions.    PATIENT EDUCATION: Education details: see above Person educated: Patient and Spouse Education method: Customer service manager Education comprehension: verbalized understanding, returned demonstration, and needs further education  HOME EXERCISE PROGRAM: Speak Out  Speech Therapy Progress Note  Dates of Reporting Period: 12/17/21 to 02/11/22  Objective Reports of Subjective Statement: Volume improving in ST, ongoing cues to cues and requests for repeats to carryover volume at home and community  Objective Measurements: See treatment for  current dB levels  Goal Update: continue all LTGs  Plan: Continue POC  Reason Skilled Services are Required: Tony [name] is progressing with achieving dB targets in Speak Out! Program during [place], however he continues to require min to mod A to carryover volume in conversation in sessions and at home. Ongoing training of how family can support Tony [name]'s slow processing to enable him to remain an active participant in conversations and communicating at home and in the community.    GOALS: Goals reviewed with patient? Yes  SHORT TERM GOALS:  Target date: 01/14/2022  Pt will complete clinical swallow evaluation (CSE) to assess need for objective swallow evaluation  Baseline: 12/24/2021 Goal status: MET  2.  Pt will complete instrumental swallow evaluation, if indicated per CSE to objectively assess swallow function and efficacy Baseline:  Goal status: deferred d/t CSE results  3.  Pt will demonstrate communication strategies to aid in attention, processing, and coherent discourse in 15 minute conversation with occasional min-A Baseline:  Goal status: MET  4.  Pt will meet dB targets for warm up and counting Speak Out exercises with rare min-A over 1 week period Baseline:  Goal status: MET  5.  Pt's care partner will teach back strategies and compensations to aid in pt's improved safety whilst eating meals at home over 1 week period Baseline:  Goal status: deferred   LONG TERM GOALS: Target date: 5/82/51 - (recert completed 89/84/21)  Pt will report daily HEP adherence for  dysphagia and dysarthria with min-A from care partner over 1 week period Baseline:  Goal status: ONGOING  2.  Pt's average volume will exceed 68 dB in answering simple, 10 minute conversation, such as answering personally relevant questions with occasional min-A over 2 sessions Baseline:  Goal status: ONGOING  3.  Pt will demonstrate saliva management techniques with compensations PRN and rare verbal cues over 30 minutes Baseline:  Goal status: ONGOING  4.  Pt's care partner will report carryover of strategies and compensations to aid in improved safety whilst eating meals at home and subjective report of reduced instances of coughing during meal times over 1 week period Baseline:  Goal status: ONGOING  5.  Pt's volume will average greater than 70 dB during Speak Out reading and cognitive exercises with occasional min-A over 2 sessions  Baseline:  Goal status: ONGOING   ASSESSMENT:  CLINICAL IMPRESSION: Patient is a 76 y.o. M who was seen  today for cognitive linguistic evaluation in presence of idiopathic Parkinson's disease with cognitive impairment versus Lewy body dementia. Pt presents with mild dysarthria, cognitive impairment, and dysphagia. arily by reduced volume and imprecise articulation. Ongoing training in HEP for dysarthria, compensations for intelligibility, attention, processing, and participation in conversations. Family/caregiver training to support communication and cognition given to spouse and 1 daughter, Tony [name]. I am requesting his other daughter, Tony [name], attend 1-2 sessions for training on providing supports to help Tony [name] participate in conversations due to slow processing and to cue him for volume in HEP and conversation while reducing the cognitive load of processing when interrupted to speak louder. He is hitting the targeted dB in HEP (speak out!) in ST sessions with occasional min A, however he is not hitting dB targets at home. Tony [name] continues to report difficulty hearing Tony [name] at home. Continued Skilled ST is indicated to maximize intelligibility, cognition for safety, reduce caregiver burden and independence . Continue LTG's, recert completed today (02/11/22) through 03/22/21.  OBJECTIVE IMPAIRMENTS: Objective impairments include attention, memory, executive functioning, expressive language, dysarthria, and dysphagia. These impairments are limiting patient from managing medications, managing appointments, managing finances, household responsibilities, ADLs/IADLs, effectively communicating at home and in community, and safety when swallowing.Factors affecting potential to achieve goals and functional outcome are ability to learn/carryover information and medical prognosis.. Patient will benefit from skilled SLP services to address above impairments and improve overall function.  REHAB POTENTIAL: Fair (degenerative dx)  PLAN:  SLP FREQUENCY: 2x/week  SLP DURATION: 8 weeks  PLANNED INTERVENTIONS: Aspiration  precaution training, Pharyngeal strengthening exercises, Diet toleration management , Language facilitation, Environmental controls, Trials of upgraded texture/liquids, Cueing hierachy, Cognitive reorganization, Internal/external aids, Functional tasks, SLP instruction and feedback, Compensatory strategies, Patient/family education, and Re-evaluation    Horald Birky, Annye Rusk, CCC-SLP 02/13/2022, 10:20 AM

## 2022-02-13 NOTE — Therapy (Signed)
OUTPATIENT PHYSICAL THERAPY NEURO TREATMENT  Patient Name: Tony [name] MRN: [id number] DOB:June 10, 1945, 76 y.o., male Today's Date: 02/13/2022   PCP: Prince Solian, MD  REFERRING PROVIDER: Marcial Pacas, MD    PT End of Session - 02/13/22 1106     Visit Number 18    Number of Visits 20   Plus eval   Date for PT Re-Evaluation 07/37/10   re-cert   Authorization Type [employer]    PT Start Time 1104   handoff from OT   PT Stop Time 1144    PT Time Calculation (min) 40 min    Equipment Utilized During Treatment Gait belt    Activity Tolerance Patient tolerated treatment well    Behavior During Therapy WFL for tasks assessed/performed                   Past Medical History:  Diagnosis Date   Anxiety    Boil of buttock    High cholesterol    Imbalance    Low back pain    Parkinsons    Past Surgical History:  Procedure Laterality Date   CYST REMOVAL LEG  2014   Patient Active Problem List   Diagnosis Date Noted   History of falling 11/29/2021   Unsteadiness on feet 11/29/2021   Excessive sleepiness 11/08/2021   Cognitive impairment 12/25/2020   Anxiety 04/25/2020   Gait abnormality 03/30/2020   Parkinson's disease 03/30/2020   Memory loss 03/30/2020   Parkinsonism 03/30/2020   CELLULITIS AND ABSCESS OF FACE 02/01/2008   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/2006   HYPERLIPIDEMIA 06/30/2006   DIVERTICULOSIS, COLON 06/30/2006   SEBORRHEIC KERATOSIS 06/30/2006   BACK PAIN, LUMBAR 06/30/2006    ONSET DATE: 11/08/2021   REFERRING DIAG: G20 (ICD-10-CM) - Parkinson's disease R41.89 (ICD-10-CM) - Cognitive impairment F41.9 (ICD-10-CM) - Anxiety R26.9 (ICD-10-CM) - Gait abnormality G47.10 (ICD-10-CM) - Excessive sleepiness   THERAPY DIAG:  Unsteadiness on feet  Other symptoms and signs involving the nervous system  Other abnormalities of gait and mobility  Muscle weakness (generalized)  Rationale for Evaluation and  Treatment Rehabilitation  SUBJECTIVE:                                                                                                                                                                                              SUBJECTIVE STATEMENT: Nothing new since he was last here. No falls.   Pt accompanied by: Daughter [name]   PERTINENT HISTORY: DaTSCAN in July 2022 showed bilateral decreased radioactive tracing in the putamen and asymmetric decreased radiotracer activity in the head of  the right caudate nucleus His cognitive impairment progress quickly compared to the idiopathic typical Parkinson's patient, he seems to have less optimal response to Sinemet, differentiation diagnosis including idiopathic Parkinson's disease with cognitive impairment versus Lewy body dementia  PAIN:  Are you having pain? No  PRECAUTIONS: Fall  WEIGHT BEARING RESTRICTIONS No  FALLS: Has patient fallen in last 6 months? Yes. Number of falls ~6 or more   PLOF: Independent  PATIENT GOALS "Work on my dexterity"    OBJECTIVE:   DIAGNOSTIC FINDINGS: DaTSCAN in July 2022 showed bilateral decreased radioactive tracing in the putamen and asymmetric decreased radiotracer activity in the head of the right caudate nucleus   COGNITION: Overall cognitive status: Impaired Noted saccadic intrusions of L eye during eval   POSTURE: rounded shoulders, forward head, and flexed trunk    TODAY'S TREATMENT:  Ther Ex SciFit at level 3.0 > 2.0 (pt reporting that level 3.0 felt like too much resistance) Multi-Peaks for 5 minutes  for reciprocal movement, dynamic cardiovascular warmup and increased amplitude of stepping. Pt requesting to stop after 5 minutes due to incr knee pain, with pt also reporting incr fatigue.   NMR: With 5 blaze pods set on random setting with pt having to tap the same color, performed 3 bouts of 2 minutes each for improved visual scanning, stepping, weight shifting, and SLS:  Standing  on level ground with 2 pods on a 2" step and 2 to the R and 1 to the L, pt performed 13 hits. Pt with more difficulty motor planning and stepping to the L side. Cued to weight shift to help initiate movement Progressed difficulty by adding in a 2" step to place the blaze pod on the R and 2 pods on the L, pt able to hit 23 on 2nd set. Pt almost losing balance when having to step to the R, requiring min A at times for balance  For last set, had pt stand on a blue mat for a compliant surface with min A as needed for balance, pt able to perform a total of 21 taps    On rockerboard in A/P direction:  Weight shifting forward/backwards trying to use ankle/hip strategy, performed x15 reps total, trying to perform without UE support, min A as needed for balance due to pt losing balance posteriorly  Static stance balance on rockerboard, performing balloon toss with 2nd PT and one PT providing min/mod A for balance due to pt losing balance posteriorly, performed approx. 20-25 hits. Trying to perform without UE support and switching between RUE and LUE. Cues when hitting with LUE to have arm elevated and bringing hand back and thinking of a BIG hit with hand. With incr practice, pt able to hit ball to other therapist gently with LUE.     PATIENT EDUCATION: Education details: Plan to D/C at next session, continued to reinforce safety with rollator (proper UE placement with sit <> stands, backing up with rollator all the way to the mat table, and using LUE to help lock brakes).  Person educated: Patient and Spouse Education method: Customer service manager Education comprehension: verbalized understanding, returned demonstration, verbal cues required, tactile cues required, and needs further education   HOME EXERCISE PROGRAM: Access Code: [email or web address] URL: [email or web address] Date: 02/11/2022 Prepared by: Janann August  Exercises - Sit to Stand Without Arm Support  - 1 x daily - 7 x  weekly - 2 sets - 10 reps - Step sideways with arms reaching at counter   - 1 x  daily - 7 x weekly - 1-2 sets - 10 reps - Alternating Step Backward with Support  - 1 x daily - 7 x weekly - 1-2 sets - 10 reps - Side to Side Weight Shift with Overhead Reach and Counter Support  - 1 x daily - 7 x weekly - 1-2 sets - 10 reps - Standing Single Leg Stance with Counter Support  - 1 x daily - 7 x weekly - 3 sets - 10 hold    GOALS: Goals reviewed with patient? Yes    UPDATED/ONGOING LTGS FOR RE-CERT LONG TERM GOALS: Target date: 03/08/22  Pt will perform final HEP w/ supervision from family for improved strength, balance, transfers and gait.  Baseline:  Goal status: IN PROGRESS  2.  Pt will improve gait velocity to at least 2.3 ft/s w/ rollator for improved gait efficiency and safety in the community  Baseline: 2.5 ft/s w/hurrycane, 1.94 ft/sec with rollator on 02/06/22  Goal status: REVISED  3.  Pt and family will verbalize understanding of local PD community resources, including fitness post DC.   Baseline:  Goal status: IN PROGRESS  ASSESSMENT:  CLINICAL IMPRESSION: Attempted the SciFit today at the beginning of session to address reciprocal movement and ROM, but pt unable to tolerate for >5 minutes due to reports of incr R knee pain. Worked on standing balance strategies with stepping, compliant surfaces, and SLS. On rockerboard, pt frequently losing balance posteriorly and needing min A for balance. Continued to reinforce rollator safety with brakes ,turning and using LUE to lock brake. Will continue to progress towards LTGs. Plan to D/C at next session with pt/spouse in agreement.    OBJECTIVE IMPAIRMENTS Abnormal gait, decreased balance, decreased cognition, decreased coordination, decreased knowledge of condition, decreased knowledge of use of DME, decreased mobility, difficulty walking, decreased safety awareness, impaired perceived functional ability, impaired vision/preception,  and improper body mechanics.   ACTIVITY LIMITATIONS carrying, lifting, bending, standing, squatting, stairs, transfers, dressing, reach over head, hygiene/grooming, locomotion level, and caring for others  PARTICIPATION LIMITATIONS: meal prep, cleaning, laundry, medication management, personal finances, interpersonal relationship, driving, shopping, community activity, occupation, and yard work  PERSONAL FACTORS Age, Education, Fitness, Past/current experiences, Time since onset of injury/illness/exacerbation, and 1 comorbidity: Lewy Body Dementia  are also affecting patient's functional outcome.   REHAB POTENTIAL: Fair due to poor prognosis of diagnosis   CLINICAL DECISION MAKING: Evolving/moderate complexity  EVALUATION COMPLEXITY: Moderate  PLAN: PT FREQUENCY: 2x/week  PT DURATION: 4 weeks  PLANNED INTERVENTIONS: Therapeutic exercises, Therapeutic activity, Neuromuscular re-education, Balance training, Gait training, Patient/Family education, Self Care, Joint mobilization, Stair training, Vestibular training, Canalith repositioning, DME instructions, Aquatic Therapy, Dry Needling, Electrical stimulation, Cryotherapy, Moist heat, Manual therapy, and Re-evaluation  PLAN FOR NEXT SESSION:  plan to D/C    Arliss Journey, PT, DPT 02/13/2022, 12:03 PM

## 2022-02-13 NOTE — Patient Instructions (Addendum)
  Keep working on cognitive exercise in Lesson 11 - do a couple a day with someone  Your next [place] is 03/06/22 - make sure you are practicing daily so you don't loose ground  Do some of the exercises online on the daily practice on Parkinson's [email or web address]  Do Lesson 12 twice more, finishing the cognitive exercise   Do voice warm ups before dinner tonight and before you have any visits, meals out or family dinners  Make a list old coot's names and review it prior to going out - bring it in the car and read over it

## 2022-02-13 NOTE — Therapy (Signed)
OUTPATIENT OCCUPATIONAL THERAPY PARKINSON'S TREATMENT  Patient Name: Tony [name] MRN: [id number] DOB:27-Jan-1946, 76 y.o., male Today's Date: 02/13/2022  PCP: Dr. Steva Ready REFERRING PROVIDER: Dr. Krista Blue   OT End of Session - 02/13/22 1025     Visit Number 4    Number of Visits 25    Date for OT Re-Evaluation 04/03/22    Authorization Type [employer]    Authorization - Visit Number 4    Authorization - Number of Visits 10    Progress Note Due on Visit 10    OT Start Time 1020    OT Stop Time 1100    OT Time Calculation (min) 40 min    Equipment Utilized During Treatment Goes by [name]    Activity Tolerance Patient tolerated treatment well    Behavior During Therapy WFL for tasks assessed/performed             Past Medical History:  Diagnosis Date   Anxiety    Boil of buttock    High cholesterol    Imbalance    Low back pain    Parkinsons    Past Surgical History:  Procedure Laterality Date   CYST REMOVAL LEG  2014   Patient Active Problem List   Diagnosis Date Noted   History of falling 11/29/2021   Unsteadiness on feet 11/29/2021   Excessive sleepiness 11/08/2021   Cognitive impairment 12/25/2020   Anxiety 04/25/2020   Gait abnormality 03/30/2020   Parkinson's disease 03/30/2020   Memory loss 03/30/2020   Parkinsonism 03/30/2020   CELLULITIS AND ABSCESS OF FACE 02/01/2008   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/2006   HYPERLIPIDEMIA 06/30/2006   DIVERTICULOSIS, COLON 06/30/2006   SEBORRHEIC KERATOSIS 06/30/2006   BACK PAIN, LUMBAR 06/30/2006    ONSET DATE: 11/29/21  REFERRING DIAG:  Diagnosis  R26.81 (ICD-10-CM) - Unsteadiness on feet  Z91.81 (ICD-10-CM) - History of falling  G20.C (ICD-10-CM) - Parkinsonism, unspecified Parkinsonism type    THERAPY DIAG:  Unsteadiness on feet  Other symptoms and signs involving the nervous system  Attention and concentration deficit  Other lack of coordination  Frontal lobe  and executive function deficit  Rationale for Evaluation and Treatment: Rehabilitation  SUBJECTIVE:   SUBJECTIVE STATEMENT: Wife reports pt doing better with jacket Goes by [name] Pt accompanied by: wife  PERTINENT HISTORY:  per Dr. Krista Blue DaTSCAN in July 2022 showed bilateral decreased radioactive tracing in the putamen and asymmetric decreased radiotracer activity in the head of the right caudate nucleus His cognitive impairment progress quickly compared to the idiopathic typical Parkinson's patient, he seems to have less optimal response to Sinemet, differentiation diagnosis including idiopathic Parkinson's disease with cognitive impairment versus Lewy body dementia. PMH: anxiety, hyperlipidemia, back pain  PRECAUTIONS: Fall  WEIGHT BEARING RESTRICTIONS: No  PAIN:  Are you having pain? No  FALLS: Has patient fallen in last 6 months? Yes. Number of falls 5  LIVING ENVIRONMENT: Lives with: lives with their spouse, dtr's assist Lives in: [place] Stairs: No Has following equipment at home:  rollator  PLOF: Independent  PATIENT GOALS: increased ease with dressing and fine motor coordination  OBJECTIVE:   HAND DOMINANCE: Right  ADLs: Overall ADLs: Pt 's dtr's assist Transfers/ambulation related to ADLs: Eating: increased spills needs assist with cutting food Grooming: mod I UB Dressing:  able to donn shirt with setup, difficulty donning/ doffing jacket LB Dressing: min A pants/ socks , uses slip on shoes,  Toileting: mod I with rollator  Bathing: distant supervision Tub  Shower transfers:stepping into shower /tub needs supervision, has grab bar   IADLs: Goes to PD cycle on Tues/ Thurs Handwriting: 100% legible  MOBILITY STATUS: Hx of falls, start hesitation, and uses rollator  POSTURE COMMENTS:  rounded shoulders and forward head   FUNCTIONAL OUTCOME MEASURES: Fastening/unfastening 3 buttons: unable Physical performance test: PPT#2 (simulated eating) 28.65 & PPT#4  (donning/doffing jacket): NT 01/30/22: PPT#4 = 1 min. 22 sec  COORDINATION: 9 Hole Peg test: Right: 69.57 sec; Left: 2 mins 20  sec Box and Blocks:  Right 21 blocks, Left 11blocks Resting tremor LUE UE ROM:   shoulder flexion: RUE 130. LUE 125 elbow flexion/ extension WFLS   UE MMT:   Not tested  SENSATION: Not tested  Vision:not formally tested, TBA in functional context  COGNITION: Overall cognitive status: Impaired  OBSERVATIONS: Bradykinesia   TODAY'S TREATMENT:        Pt issued coordination HEP - see pt instructions for details. Modified ex's d/t apraxia and cognitive deficits.     Practiced writing with focus on writing big (in print), spacing letters, and sliding forearm along width of page.                                                                                                                     PATIENT EDUCATION: Education details: coordination HEP Person educated: Patient and Spouse Education method: Explanation, Demonstration, Verbal cues, and Handouts Education comprehension: verbalized understanding, returned demonstration, verbal cues required, and needs further education   HOME EXERCISE PROGRAM: 02/11/22: bag exercises, strategies for donning/doffing jacket, donning/doffing pants 02/13/22: Coordination HEP   GOALS:  Goals reviewed with patient? Yes  SHORT TERM GOALS: Target date: 02/06/22  I with PD specific HEP Baseline: Goal status: in progress  2.  Pt will verbalize understanding of adapted strategies to maximize safety and I with ADLs/ [place] to minimize fall risk . Baseline:  Goal status: in progress  3.  Pt will demonstrate improved ease with feeding as evidenced by decreasing PPT#2 (self feeding) by 3 secs Baseline:  Goal status: INITIAL  4.  Pt will demonstrate improved UE functional use as evidenced by increasing box and blocks score by 4 blocks bilaterally. Baseline:  Right 21 blocks, Left 11 blocks Goal status:  INITIAL  5.    Improve PPT#4 to under 1 minute Baseline: 1 min. 22 sec Goal status: in progress    LONG TERM GOALS: Target date: 04/03/22  Pt will verbalize understanding of ways to prevent future  PD related complications and PD community resources. Baseline:  Goal status: INITIAL  2. Pt will demonstrate improved fine motor coordination for ADLs as evidenced by decreasing 9 hole peg test score for RUE by 4 secs Baseline: 9 Hole Peg test: Right: 69.57 sec; Left: 2 mins 20  sec Goal status: INITIAL  3.  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 Will need continued reinforcement of all ADL strategies  PERFORMANCE DEFICITS: in functional skills including ADLs, IADLs, coordination, dexterity, strength, flexibility, Fine motor control, Gross motor control, mobility, balance, endurance, decreased knowledge of precautions, decreased knowledge of use of DME, and UE functional use, cognitive skills including memory, problem solving, safety awareness, and thought, and psychosocial skills including coping strategies, environmental adaptation, habits, interpersonal interactions, and routines and behaviors.   IMPAIRMENTS: are limiting patient from ADLs, IADLs, play, leisure, and social participation.   COMORBIDITIES:  may have co-morbidities  that affects occupational performance. Patient will benefit from skilled OT to address above impairments and improve overall function.  MODIFICATION OR ASSISTANCE TO COMPLETE EVALUATION: No modification of tasks or assist  necessary to complete an evaluation.  OT OCCUPATIONAL PROFILE AND HISTORY: Detailed assessment: Review of records and additional review of physical, cognitive, psychosocial history related to current functional performance.  CLINICAL DECISION MAKING: LOW - limited treatment options, no task modification necessary  REHAB POTENTIAL: Good  EVALUATION COMPLEXITY: Low    PLAN:  OT FREQUENCY: 2x/week  OT DURATION: 12 weeks plus eval to account for scheduling  PLANNED INTERVENTIONS: self care/ADL training, therapeutic exercise, therapeutic activity, neuromuscular re-education, manual therapy, passive range of motion, gait training, balance training, functional mobility training, aquatic therapy, splinting, ultrasound, paraffin, fluidotherapy, moist heat, cryotherapy, patient/family education, cognitive remediation/compensation, visual/perceptual remediation/compensation, energy conservation, coping strategies training, DME and/or AE instructions, and Re-evaluation  RECOMMENDED OTHER SERVICES: PT, ST  CONSULTED AND AGREED WITH PLAN OF CARE: Patient and family member/caregiver  PLAN FOR NEXT SESSION: practice simulated eating and cutting food,  reinforce ADLS and fall prevention   Hans Eden, OT 02/13/2022, 10:26 AM

## 2022-02-13 NOTE — Patient Instructions (Signed)
ALL BELOW EXERCISES SEATED  BOTH HANDS:   PWR! Hands: begin with hands closed at chest - push out in front of you with both hands together - straightening elbows, wrists up, fingers open x 10  Flip cards over one at a time, opening hand big and turning big. Do 1/2 deck with Rt hand, 1/2 deck w/ Lt hand  Flick cards across table by opening hand big and forcefully each hand  Pick up pennies one at a time to place in container - open hand big before picking up each penny. Practice w/ each hand.   Toss light volleyball sized ball to family member back and forth  Practice writing w/ Rt hand - focus on slowing down, focus on forming each letter, space each letter and write big. Make sure to slide your forearm across the page with each word. Use Lt hand to stabilize paper.

## 2022-02-19 NOTE — Therapy (Signed)
OUTPATIENT OCCUPATIONAL THERAPY PARKINSON'S TREATMENT  Patient Name: Tony [name] MRN: [id number] DOB:09/27/1945, 76 y.o., male Today's Date: 02/20/2022  PCP: Dr. Steva Ready REFERRING PROVIDER: Dr. Krista Blue   OT End of Session - 02/20/22 0854     Visit Number 5    Number of Visits 25    Date for OT Re-Evaluation 04/03/22    Authorization Type [employer]    Authorization - Visit Number 5    Authorization - Number of Visits 10    Progress Note Due on Visit 10    OT Start Time 0850    OT Stop Time 0930    OT Time Calculation (min) 40 min              Past Medical History:  Diagnosis Date   Anxiety    Boil of buttock    High cholesterol    Imbalance    Low back pain    Parkinsons    Past Surgical History:  Procedure Laterality Date   CYST REMOVAL LEG  2014   Patient Active Problem List   Diagnosis Date Noted   History of falling 11/29/2021   Unsteadiness on feet 11/29/2021   Excessive sleepiness 11/08/2021   Cognitive impairment 12/25/2020   Anxiety 04/25/2020   Gait abnormality 03/30/2020   Parkinson's disease 03/30/2020   Memory loss 03/30/2020   Parkinsonism 03/30/2020   CELLULITIS AND ABSCESS OF FACE 02/01/2008   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/2006   HYPERLIPIDEMIA 06/30/2006   DIVERTICULOSIS, COLON 06/30/2006   SEBORRHEIC KERATOSIS 06/30/2006   BACK PAIN, LUMBAR 06/30/2006    ONSET DATE: 11/29/21  REFERRING DIAG:  Diagnosis  R26.81 (ICD-10-CM) - Unsteadiness on feet  Z91.81 (ICD-10-CM) - History of falling  G20.C (ICD-10-CM) - Parkinsonism, unspecified Parkinsonism type    THERAPY DIAG:  Other symptoms and signs involving the nervous system  Other abnormalities of gait and mobility  Muscle weakness (generalized)  Attention and concentration deficit  Other lack of coordination  Frontal lobe and executive function deficit  Abnormal posture  Rationale for Evaluation and Treatment:  Rehabilitation  SUBJECTIVE:   SUBJECTIVE STATEMENT: Pt reports a fall [name] Pt accompanied by: wife  PERTINENT HISTORY:  per Dr. Krista Blue [facility]AN in July 2022 showed bilateral decreased radioactive tracing in the putamen and asymmetric decreased radiotracer activity in the head of the right caudate nucleus His cognitive impairment progress quickly compared to the idiopathic typical Parkinson's patient, he seems to have less optimal response to Sinemet, differentiation diagnosis including idiopathic Parkinson's disease with cognitive impairment versus Lewy body dementia. PMH: anxiety, hyperlipidemia, back pain  PRECAUTIONS: Fall  WEIGHT BEARING RESTRICTIONS: No  PAIN:  Are you having pain? No  FALLS: Has patient fallen in last 6 months? Yes. Number of falls 5  LIVING ENVIRONMENT: Lives with: lives with their spouse, dtr's assist Lives in: [place] Stairs: No Has following equipment at home:  rollator  PLOF: Independent  PATIENT GOALS: increased ease with dressing and fine motor coordination  OBJECTIVE:   HAND DOMINANCE: Right  ADLs: Overall ADLs: Pt 's dtr's assist Transfers/ambulation related to ADLs: Eating: increased spills needs assist with cutting food Grooming: mod I UB Dressing:  able to donn shirt with setup, difficulty donning/ doffing jacket LB Dressing: min A pants/ socks , uses slip on shoes,  Toileting: mod I with rollator  Bathing: distant supervision Tub Shower transfers:stepping into shower /tub needs supervision, has grab bar   IADLs: Goes to PD cycle on Tues/ Thurs Handwriting:  100% legible  MOBILITY STATUS: Hx of falls, start hesitation, and uses rollator  POSTURE COMMENTS:  rounded shoulders and forward head   FUNCTIONAL OUTCOME MEASURES: Fastening/unfastening 3 buttons: unable Physical performance test: PPT#2 (simulated eating) 28.65 & PPT#4 (donning/doffing jacket): NT 01/30/22: PPT#4 = 1 min. 22 sec  COORDINATION: 9 Hole Peg test:  Right: 69.57 sec; Left: 2 mins 20  sec Box and Blocks:  Right 21 blocks, Left 11blocks Resting tremor LUE UE ROM:   shoulder flexion: RUE 130. LUE 125 elbow flexion/ extension WFLS   UE MMT:   Not tested  SENSATION: Not tested  Vision:not formally tested, TBA in functional context  COGNITION: Overall cognitive status: Impaired  OBSERVATIONS: Bradykinesia   TODAY'S TREATMENT:        Pt's daughters accompanied him today. Pt doffed his vest seated  with mod difficulty and v.c, increased time required  Education was provided regarding strategies for cutting food, and scooping food. Foam grip issued, min-mod v.c and demonstration. Education regarding safety strategies for bathing and pt's need for verbal cues, supervision for safety . Pt had a fall wlking with his cane, rollator is recommended and if a space is too tight for rollator pt needs supervision                                                                                           PATIENT EDUCATION: Education details: see above Person educated: Patient and Spouse Education method: Explanation, Demonstration, Verbal cues, and Handouts Education comprehension: verbalized understanding, returned demonstration, verbal cues required, and needs further education   HOME EXERCISE PROGRAM: 02/11/22: bag exercises, strategies for donning/doffing jacket, donning/doffing pants 02/13/22: Coordination HEP   GOALS:  Goals reviewed with patient? Yes  SHORT TERM GOALS: Target date:03/04/22- extended due to scheduling  I with PD specific HEP Baseline: Goal status: in progress  2.  Pt will verbalize understanding of adapted strategies to maximize safety and I with ADLs/ [place] to minimize fall risk . Baseline:  Goal status: in progress  3.  Pt will demonstrate improved ease with feeding as evidenced by decreasing PPT#2 (self feeding) by 3 secs Baseline:  Goal status: INITIAL  4.  Pt will demonstrate improved UE functional  use as evidenced by increasing box and blocks score by 4 blocks bilaterally. Baseline:  Right 21 blocks, Left 11 blocks Goal status: INITIAL  5.    Improve PPT#4 to under 1 minute Baseline: 1 min. 22 sec Goal status: in progress    LONG TERM GOALS: Target date: 04/03/22  Pt will verbalize understanding of ways to prevent future  PD related complications and PD community resources. Baseline:  Goal status: INITIAL  2. Pt will demonstrate improved fine motor coordination for ADLs as evidenced by decreasing 9 hole peg test score for RUE by 4 secs Baseline: 9 Hole Peg test: Right: 69.57 sec; Left: 2 mins 20  sec Goal status: INITIAL  3.  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 Will need continued reinforcement of all ADL strategies  PERFORMANCE DEFICITS: in functional skills including ADLs, IADLs, coordination, dexterity, strength, flexibility, Fine motor control, Gross motor control, mobility, balance, endurance, decreased knowledge of precautions, decreased knowledge of use of DME, and UE functional use, cognitive skills including memory, problem solving, safety awareness, and thought, and psychosocial skills including coping strategies, environmental adaptation, habits, interpersonal interactions, and routines and behaviors.   IMPAIRMENTS: are limiting patient from ADLs, IADLs, play, leisure, and social participation.   COMORBIDITIES:  may have co-morbidities  that affects occupational performance. Patient will benefit from skilled OT to address  above impairments and improve overall function.  MODIFICATION OR ASSISTANCE TO COMPLETE EVALUATION: No modification of tasks or assist necessary to complete an evaluation.  OT OCCUPATIONAL PROFILE AND HISTORY: Detailed assessment: Review of records and additional review of physical, cognitive, psychosocial history related to current functional performance.  CLINICAL DECISION MAKING: LOW - limited treatment options, no task modification necessary  REHAB POTENTIAL: Good  EVALUATION COMPLEXITY: Low    PLAN:  OT FREQUENCY: 2x/week  OT DURATION: 12 weeks plus eval to account for scheduling  PLANNED INTERVENTIONS: self care/ADL training, therapeutic exercise, therapeutic activity, neuromuscular re-education, manual therapy, passive range of motion, gait training, balance training, functional mobility training, aquatic therapy, splinting, ultrasound, paraffin, fluidotherapy, moist heat, cryotherapy, patient/family education, cognitive remediation/compensation, visual/perceptual remediation/compensation, energy conservation, coping strategies training, DME and/or AE instructions, and Re-evaluation  RECOMMENDED OTHER SERVICES: PT, ST  CONSULTED AND AGREED WITH PLAN OF CARE: Patient and family member/caregiver  PLAN FOR NEXT SESSION:start checking short term goals, reinforce ADLS and fall prevention   Frida Wahlstrom, OT 02/20/2022, 12:16 PM

## 2022-02-20 ENCOUNTER — Ambulatory Visit: Payer: PPO | Admitting: Occupational Therapy

## 2022-02-20 ENCOUNTER — Encounter: Payer: Self-pay | Admitting: Physical Therapy

## 2022-02-20 ENCOUNTER — Ambulatory Visit: Payer: PPO | Admitting: Physical Therapy

## 2022-02-20 DIAGNOSIS — R41840 Attention and concentration deficit: Secondary | ICD-10-CM

## 2022-02-20 DIAGNOSIS — R29818 Other symptoms and signs involving the nervous system: Secondary | ICD-10-CM

## 2022-02-20 DIAGNOSIS — R471 Dysarthria and anarthria: Secondary | ICD-10-CM | POA: Diagnosis not present

## 2022-02-20 DIAGNOSIS — R2689 Other abnormalities of gait and mobility: Secondary | ICD-10-CM

## 2022-02-20 DIAGNOSIS — R41844 Frontal lobe and executive function deficit: Secondary | ICD-10-CM

## 2022-02-20 DIAGNOSIS — M6281 Muscle weakness (generalized): Secondary | ICD-10-CM

## 2022-02-20 DIAGNOSIS — R293 Abnormal posture: Secondary | ICD-10-CM

## 2022-02-20 DIAGNOSIS — R278 Other lack of coordination: Secondary | ICD-10-CM

## 2022-02-20 DIAGNOSIS — R2681 Unsteadiness on feet: Secondary | ICD-10-CM

## 2022-02-20 NOTE — Therapy (Signed)
OUTPATIENT PHYSICAL THERAPY NEURO TREATMENT  Patient Name: Tony [name] MRN: [id number] DOB:[birth year], 76 y.o., male Today's Date: 02/20/2022   PCP: Prince Solian, MD  REFERRING PROVIDER: Marcial Pacas, MD    PT End of Session - 02/20/22 0933     Visit Number 19    Number of Visits 21   Plus eval   Date for PT Re-Evaluation 51/76/16   re-cert   Authorization Type [employer]    PT Start Time 0931    PT Stop Time 1015    PT Time Calculation (min) 44 min    Equipment Utilized During Treatment Gait belt    Activity Tolerance Patient tolerated treatment well    Behavior During Therapy WFL for tasks assessed/performed                    Past Medical History:  Diagnosis Date   Anxiety    Boil of buttock    High cholesterol    Imbalance    Low back pain    Parkinsons    Past Surgical History:  Procedure Laterality Date   CYST REMOVAL LEG  2014   Patient Active Problem List   Diagnosis Date Noted   History of falling 11/29/2021   Unsteadiness on feet 11/29/2021   Excessive sleepiness 11/08/2021   Cognitive impairment 12/25/2020   Anxiety 04/25/2020   Gait abnormality 03/30/2020   Parkinson's disease 03/30/2020   Memory loss 03/30/2020   Parkinsonism 03/30/2020   CELLULITIS AND ABSCESS OF FACE 02/01/2008   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/2006   HYPERLIPIDEMIA 06/30/2006   DIVERTICULOSIS, COLON 06/30/2006   SEBORRHEIC KERATOSIS 06/30/2006   BACK PAIN, LUMBAR 06/30/2006    ONSET DATE: 11/08/2021   REFERRING DIAG: G20 (ICD-10-CM) - Parkinson's disease R41.89 (ICD-10-CM) - Cognitive impairment F41.9 (ICD-10-CM) - Anxiety R26.9 (ICD-10-CM) - Gait abnormality G47.10 (ICD-10-CM) - Excessive sleepiness   THERAPY DIAG:  Other symptoms and signs involving the nervous system  Other abnormalities of gait and mobility  Muscle weakness (generalized)  Unsteadiness on feet  Rationale for Evaluation and Treatment  Rehabilitation  SUBJECTIVE:                                                                                                                                                                                              SUBJECTIVE STATEMENT: Had a fall on Christmas day. Was walking with his cane and went to turn too quickly. Reports that 2 of his family members grabbed onto him and helped him upright.   Pt accompanied by: Daughters  PERTINENT HISTORY: [facility] in July 2022  showed bilateral decreased radioactive tracing in the putamen and asymmetric decreased radiotracer activity in the head of the right caudate nucleus His cognitive impairment progress quickly compared to the idiopathic typical Parkinson's patient, he seems to have less optimal response to Sinemet, differentiation diagnosis including idiopathic Parkinson's disease with cognitive impairment versus Lewy body dementia  PAIN:  Are you having pain? No  PRECAUTIONS: Fall  WEIGHT BEARING RESTRICTIONS No  FALLS: Has patient fallen in last 6 months? Yes. Number of falls ~6 or more   PLOF: Independent  PATIENT GOALS "Work on my dexterity"    OBJECTIVE:   DIAGNOSTIC FINDINGS: DaTSCAN in July 2022 showed bilateral decreased radioactive tracing in the putamen and asymmetric decreased radiotracer activity in the head of the right caudate nucleus   COGNITION: Overall cognitive status: Impaired Noted saccadic intrusions of L eye during eval   POSTURE: rounded shoulders, forward head, and flexed trunk    TODAY'S TREATMENT:  Therapeutic Activity  Pt with 2 of his daughters today  Reviewed fall prevention with pt's daughters present and due to recent fall when ambulating with cane. Discussed importance of using rollator at all times (pt reports that he is still furniture waking for balance at times) and that if areas are too tight to use a rollator then to use his cane with someone else providing supervision and  showed/demonstrated use of a gait belt just in case with pt's daughter planning on using one. Went over safety measures - such as getting a life alert or apple watch with fall detection in case pt is alone and has a fall  Reviewed proper cues for family to give pt when ambulating with the rollator - tall posture, staying close to rollator (keeping feet under the seat) as well as for transfers with rollator (when to lock brakes and proper technique and hand placement as pt has tendency to place both hands on rollator to stand up). Pt's daughter asking about pt sitting on his rollator. Reviewed this at the end of session - discussed he would need to have to back it up to somewhere sturdy like a wall/furniture, lock the brakes and slowly turn to stand up and then reverse it coming up, discussed would need supervision for reminder for cues due to cognitive deficits.  Practiced proper technique for getting on and off the floor for fall recovery, placed red mat on the floor and pt able to get down on the floor with supervision and verbal cues for pt to turn towards mat table and then lower down to the floor to sit on his buttocks. Reviewed steps that if pt has a fall, make sure that he is not injured or bleeding before he tries to get up. Had pt practice getting on his knees (needing verbal cues for how to roll over), and then crawling to a sturdy surface/chair, getting into tall kneel with stronger leg forwards and then pressing up to stand. Performed x2 reps with pt only needing supervision and verbal cues for sequencing. Discussed at home can have someone else provide supervision for balance when pt stands and to talk pt through [place]. Discussed if pt has a fall on his own, would not want pt getting up by himself and in the future that if it is not safe for pt to get up then to call fire department/EMS  Original plan for D/C today, but discussed that will plan to add 2 more visits to review exercises as well  as answer any additional family questions  regarding safety and for fall prevention.     PATIENT EDUCATION: Education details: See therapeutic activity section above.  Person educated: Patient and 2 daughters  Education method: Explanation, Demonstration, and Verbal cues Education comprehension: verbalized understanding, returned demonstration, verbal cues required, tactile cues required, and needs further education   HOME EXERCISE PROGRAM: Access Code: [email or web address] URL: [email or web address] Date: 02/11/2022 Prepared by: Janann August  Exercises - Sit to Stand Without Arm Support  - 1 x daily - 7 x weekly - 2 sets - 10 reps - Step sideways with arms reaching at counter   - 1 x daily - 7 x weekly - 1-2 sets - 10 reps - Alternating Step Backward with Support  - 1 x daily - 7 x weekly - 1-2 sets - 10 reps - Side to Side Weight Shift with Overhead Reach and Counter Support  - 1 x daily - 7 x weekly - 1-2 sets - 10 reps - Standing Single Leg Stance with Counter Support  - 1 x daily - 7 x weekly - 3 sets - 10 hold    GOALS: Goals reviewed with patient? Yes    UPDATED/ONGOING LTGS FOR RE-CERT LONG TERM GOALS: Target date: 03/08/22  Pt will perform final HEP w/ supervision from family for improved strength, balance, transfers and gait.  Baseline:  Goal status: IN PROGRESS  2.  Pt will improve gait velocity to at least 2.3 ft/s w/ rollator for improved gait efficiency and safety in the community  Baseline: 2.5 ft/s w/hurrycane, 1.94 ft/sec with rollator on 02/06/22  Goal status: REVISED  3.  Pt and family will verbalize understanding of local PD community resources, including fitness post DC.   Baseline:  Goal status: IN PROGRESS   ASSESSMENT:  CLINICAL IMPRESSION: Pt present with 2 daughters today. Today's session heavily focused on reviewing and educating on fall prevention measures, fall recovery, safety with transfers and gait. Went over proper safety and  cues with pt due to cognitive impairments and difficulties with motor planning. See above therapeutic activity section for further details. Was planning on discharging today, but will add 1-2 more visits to answer any other questions regarding safety/fall prevention before D/C and to re-enforce strategies with transfers. Will continue to progress towards LTGs.     OBJECTIVE IMPAIRMENTS Abnormal gait, decreased balance, decreased cognition, decreased coordination, decreased knowledge of condition, decreased knowledge of use of DME, decreased mobility, difficulty walking, decreased safety awareness, impaired perceived functional ability, impaired vision/preception, and improper body mechanics.   ACTIVITY LIMITATIONS carrying, lifting, bending, standing, squatting, stairs, transfers, dressing, reach over head, hygiene/grooming, locomotion level, and caring for others  PARTICIPATION LIMITATIONS: meal prep, cleaning, laundry, medication management, personal finances, interpersonal relationship, driving, shopping, community activity, occupation, and yard work  PERSONAL FACTORS Age, Education, Fitness, Past/current experiences, Time since onset of injury/illness/exacerbation, and 1 comorbidity: Lewy Body Dementia  are also affecting patient's functional outcome.   REHAB POTENTIAL: Fair due to poor prognosis of diagnosis   CLINICAL DECISION MAKING: Evolving/moderate complexity  EVALUATION COMPLEXITY: Moderate  PLAN: PT FREQUENCY: 2x/week  PT DURATION: 4 weeks  PLANNED INTERVENTIONS: Therapeutic exercises, Therapeutic activity, Neuromuscular re-education, Balance training, Gait training, Patient/Family education, Self Care, Joint mobilization, Stair training, Vestibular training, Canalith repositioning, DME instructions, Aquatic Therapy, Dry Needling, Electrical stimulation, Cryotherapy, Moist heat, Manual therapy, and Re-evaluation  PLAN FOR NEXT SESSION: added 2 more appts (maybe can D/C at next  appt) to further review fall prevention/safety and answer any additional questions, finalize HEP    Comer Devins  Cherlynn Kaiser, PT, DPT 02/20/2022, 10:20 AM

## 2022-02-27 ENCOUNTER — Encounter: Payer: Self-pay | Admitting: Occupational Therapy

## 2022-02-27 ENCOUNTER — Ambulatory Visit: Payer: PPO | Attending: Neurology | Admitting: Occupational Therapy

## 2022-02-27 ENCOUNTER — Ambulatory Visit: Payer: PPO | Admitting: Physical Therapy

## 2022-02-27 DIAGNOSIS — R2681 Unsteadiness on feet: Secondary | ICD-10-CM

## 2022-02-27 DIAGNOSIS — R41840 Attention and concentration deficit: Secondary | ICD-10-CM

## 2022-02-27 DIAGNOSIS — R2689 Other abnormalities of gait and mobility: Secondary | ICD-10-CM | POA: Insufficient documentation

## 2022-02-27 DIAGNOSIS — R29818 Other symptoms and signs involving the nervous system: Secondary | ICD-10-CM

## 2022-02-27 DIAGNOSIS — R41844 Frontal lobe and executive function deficit: Secondary | ICD-10-CM

## 2022-02-27 DIAGNOSIS — R41841 Cognitive communication deficit: Secondary | ICD-10-CM | POA: Diagnosis not present

## 2022-02-27 DIAGNOSIS — R471 Dysarthria and anarthria: Secondary | ICD-10-CM | POA: Insufficient documentation

## 2022-02-27 DIAGNOSIS — R278 Other lack of coordination: Secondary | ICD-10-CM | POA: Diagnosis not present

## 2022-02-27 DIAGNOSIS — R1310 Dysphagia, unspecified: Secondary | ICD-10-CM | POA: Diagnosis not present

## 2022-02-27 DIAGNOSIS — R293 Abnormal posture: Secondary | ICD-10-CM | POA: Insufficient documentation

## 2022-02-27 DIAGNOSIS — M6281 Muscle weakness (generalized): Secondary | ICD-10-CM | POA: Diagnosis not present

## 2022-02-27 NOTE — Therapy (Signed)
OUTPATIENT OCCUPATIONAL THERAPY PARKINSON'S TREATMENT  Patient Name: Tony [name] MRN: [id number] DOB:Feb 09, 1946, 77 y.o., male Today's Date: 02/27/2022  PCP: Dr. Steva Ready REFERRING PROVIDER: Dr. Krista Blue   OT End of Session - 02/27/22 0934     Visit Number 6    Number of Visits 25    Date for OT Re-Evaluation 04/03/22    Authorization Type [employer]    Authorization - Visit Number 6    Authorization - Number of Visits 10    Progress Note Due on Visit 10    OT Start Time 0930    OT Stop Time 1015    OT Time Calculation (min) 45 min    Activity Tolerance Patient tolerated treatment well    Behavior During Therapy [facility] for tasks assessed/performed              Past Medical History:  Diagnosis Date   Anxiety    Boil of buttock    High cholesterol    Imbalance    Low back pain    Parkinsons    Past Surgical History:  Procedure Laterality Date   CYST REMOVAL LEG  2014   Patient Active Problem List   Diagnosis Date Noted   History of falling 11/29/2021   Unsteadiness on feet 11/29/2021   Excessive sleepiness 11/08/2021   Cognitive impairment 12/25/2020   Anxiety 04/25/2020   Gait abnormality 03/30/2020   Parkinson's disease 03/30/2020   Memory loss 03/30/2020   Parkinsonism 03/30/2020   CELLULITIS AND ABSCESS OF FACE 02/01/2008   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/2006   HYPERLIPIDEMIA 06/30/2006   DIVERTICULOSIS, COLON 06/30/2006   SEBORRHEIC KERATOSIS 06/30/2006   BACK PAIN, LUMBAR 06/30/2006    ONSET DATE: 11/29/21  REFERRING DIAG:  Diagnosis  R26.81 (ICD-10-CM) - Unsteadiness on feet  Z91.81 (ICD-10-CM) - History of falling  G20.C (ICD-10-CM) - Parkinsonism, unspecified Parkinsonism type    THERAPY DIAG:  Other symptoms and signs involving the nervous system  Muscle weakness (generalized)  Unsteadiness on feet  Attention and concentration deficit  Other lack of coordination  Frontal lobe and executive  function deficit  Rationale for Evaluation and Treatment: Rehabilitation  SUBJECTIVE:   SUBJECTIVE STATEMENT: Pt had 2 bad falls over Christmas per wife report Goes by [name] Pt accompanied by: wife  PERTINENT HISTORY:  per Dr. Krista Blue DaTSCAN in July 2022 showed bilateral decreased radioactive tracing in the putamen and asymmetric decreased radiotracer activity in the head of the right caudate nucleus His cognitive impairment progress quickly compared to the idiopathic typical Parkinson's patient, he seems to have less optimal response to Sinemet, differentiation diagnosis including idiopathic Parkinson's disease with cognitive impairment versus Lewy body dementia. PMH: anxiety, hyperlipidemia, back pain  PRECAUTIONS: Fall  WEIGHT BEARING RESTRICTIONS: No  PAIN:  Are you having pain? No  FALLS: Has patient fallen in last 6 months? Yes. Number of falls 5  LIVING ENVIRONMENT: Lives with: lives with their spouse, dtr's assist Lives in: [place] Stairs: No Has following equipment at home:  rollator  PLOF: Independent  PATIENT GOALS: increased ease with dressing and fine motor coordination  OBJECTIVE:   HAND DOMINANCE: Right  ADLs: Overall ADLs: Pt 's dtr's assist Transfers/ambulation related to ADLs: Eating: increased spills needs assist with cutting food Grooming: mod I UB Dressing:  able to donn shirt with setup, difficulty donning/ doffing jacket LB Dressing: min A pants/ socks , uses slip on shoes,  Toileting: mod I with rollator  Bathing: distant supervision Tub Shower transfers:stepping  into shower /tub needs supervision, has grab bar   IADLs: Goes to PD cycle on Tues/ Thurs Handwriting: 100% legible  MOBILITY STATUS: Hx of falls, start hesitation, and uses rollator  POSTURE COMMENTS:  rounded shoulders and forward head   FUNCTIONAL OUTCOME MEASURES: Fastening/unfastening 3 buttons: unable Physical performance test: PPT#2 (simulated eating) 28.65 & PPT#4  (donning/doffing jacket): NT 01/30/22: PPT#4 = 1 min. 22 sec  COORDINATION: 9 Hole Peg test: Right: 69.57 sec; Left: 2 mins 20  sec Box and Blocks:  Right 21 blocks, Left 11blocks Resting tremor LUE UE ROM:   shoulder flexion: RUE 130. LUE 125 elbow flexion/ extension WFLS   UE MMT:   Not tested  SENSATION: Not tested  Vision:not formally tested, TBA in functional context  COGNITION: Overall cognitive status: Impaired  OBSERVATIONS: Bradykinesia   TODAY'S TREATMENT:        Pt had 2 recent falls over Christmas week. Pt reports he was turning with both falls. Pt did not have rollator either.   Pt's wife reports helping him bathe because he's not using his Lt arm - therapist encouraged wife to still have him use is Lt hand to help bathe (while seated), use LH sponge for back and feet, and loofah or bath mitt for Lt hand. Pt will still need supervision and cueing however.   Also discussed trying foot stool for donning pants over feet and socks to increase participation/independence while preventing falling forward out of chair.   Reviewed bag ex's and modifications prn Discussed safety with rollator and turns and reviewed method for donning/doffing jacket   PATIENT EDUCATION: Education details: see above Person educated: Patient and Spouse Education method: Explanation, Demonstration, Verbal cues, and Handouts Education comprehension: verbalized understanding, returned demonstration, verbal cues required, and needs further education   HOME EXERCISE PROGRAM: 02/11/22: bag exercises, strategies for donning/doffing jacket, donning/doffing pants 02/13/22: Coordination HEP   GOALS:  Goals reviewed with patient? Yes  SHORT TERM GOALS: Target date:03/04/22- extended due to scheduling  I with PD specific HEP Baseline: Goal status: in progress  2.  Pt will verbalize understanding of adapted strategies to maximize safety and I with ADLs/ [place] to minimize fall risk  . Baseline:  Goal status: in progress  3.  Pt will demonstrate improved ease with feeding as evidenced by decreasing PPT#2 (self feeding) by 3 secs Baseline:  Goal status: INITIAL  4.  Pt will demonstrate improved UE functional use as evidenced by increasing box and blocks score by 4 blocks bilaterally. Baseline:  Right 21 blocks, Left 11 blocks Goal status: INITIAL  5.    Improve PPT#4 to under 1 minute Baseline: 1 min. 22 sec Goal status: in progress    LONG TERM GOALS: Target date: 04/03/22  Pt will verbalize understanding of ways to prevent future  PD related complications and PD community resources. Baseline:  Goal status: INITIAL  2. Pt will demonstrate improved fine motor coordination for ADLs as evidenced by decreasing 9 hole peg test score for RUE by 4 secs Baseline: 9 Hole Peg test: Right: 69.57 sec; Left: 2 mins 20  sec Goal status: INITIAL  3.  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 Will need continued reinforcement of all ADL strategies  PERFORMANCE DEFICITS: in functional skills including ADLs, IADLs, coordination, dexterity, strength, flexibility, Fine motor control, Gross motor control, mobility, balance, endurance, decreased knowledge of precautions, decreased knowledge of use of DME, and UE functional use, cognitive skills including memory, problem solving, safety awareness, and thought, and psychosocial skills including coping strategies, environmental adaptation, habits, interpersonal interactions, and routines and  behaviors.   IMPAIRMENTS: are limiting patient from ADLs, IADLs, play, leisure, and social participation.   COMORBIDITIES:  may have co-morbidities  that affects occupational performance. Patient will benefit from skilled OT to address above impairments and improve overall function.  MODIFICATION OR ASSISTANCE TO COMPLETE EVALUATION: No modification of tasks or assist necessary to complete an evaluation.  OT OCCUPATIONAL PROFILE AND HISTORY: Detailed assessment: Review of records and additional review of physical, cognitive, psychosocial history related to current functional performance.  CLINICAL DECISION MAKING: LOW - limited treatment options, no task modification necessary  REHAB POTENTIAL: Good  EVALUATION COMPLEXITY: Low    PLAN:  OT FREQUENCY: 2x/week  OT DURATION: 12 weeks plus eval to account for scheduling  PLANNED INTERVENTIONS: self care/ADL training, therapeutic exercise, therapeutic activity, neuromuscular re-education, manual therapy, passive range of motion, gait training, balance training, functional mobility training, aquatic therapy, splinting, ultrasound, paraffin, fluidotherapy, moist heat, cryotherapy, patient/family education, cognitive remediation/compensation, visual/perceptual remediation/compensation, energy conservation, coping strategies training, DME and/or AE instructions, and Re-evaluation  RECOMMENDED OTHER SERVICES: PT, ST  CONSULTED AND AGREED WITH PLAN OF CARE: Patient and family member/caregiver  PLAN FOR NEXT SESSION: start checking short term goals, practice rollator in kitchen    [employer], OT 02/27/2022, 9:35 AM

## 2022-02-27 NOTE — Therapy (Signed)
OUTPATIENT PHYSICAL THERAPY NEURO TREATMENT Patient Name: Tony [name] MRN: [id number] DOB:[birth year], 77 y.o., male Today's Date: 02/27/2022   PCP: Prince Solian, MD  REFERRING PROVIDER: Marcial Pacas, MD    PT End of Session - 02/27/22 1219     Visit Number 20    Number of Visits 21   Plus eval   Date for PT Re-Evaluation 26/71/24   re-cert   Authorization Type [employer]    PT Start Time 1018    PT Stop Time 1100    PT Time Calculation (min) 42 min    Equipment Utilized During Treatment Gait belt    Activity Tolerance Patient tolerated treatment well    Behavior During Therapy WFL for tasks assessed/performed                     Past Medical History:  Diagnosis Date   Anxiety    Boil of buttock    High cholesterol    Imbalance    Low back pain    Parkinsons    Past Surgical History:  Procedure Laterality Date   CYST REMOVAL LEG  2014   Patient Active Problem List   Diagnosis Date Noted   History of falling 11/29/2021   Unsteadiness on feet 11/29/2021   Excessive sleepiness 11/08/2021   Cognitive impairment 12/25/2020   Anxiety 04/25/2020   Gait abnormality 03/30/2020   Parkinson's disease 03/30/2020   Memory loss 03/30/2020   Parkinsonism 03/30/2020   CELLULITIS AND ABSCESS OF FACE 02/01/2008   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/2006   HYPERLIPIDEMIA 06/30/2006   DIVERTICULOSIS, COLON 06/30/2006   SEBORRHEIC KERATOSIS 06/30/2006   BACK PAIN, LUMBAR 06/30/2006    ONSET DATE: 11/08/2021   REFERRING DIAG: G20 (ICD-10-CM) - Parkinson's disease R41.89 (ICD-10-CM) - Cognitive impairment F41.9 (ICD-10-CM) - Anxiety R26.9 (ICD-10-CM) - Gait abnormality G47.10 (ICD-10-CM) - Excessive sleepiness   THERAPY DIAG:  Other symptoms and signs involving the nervous system  Muscle weakness (generalized)  Other abnormalities of gait and mobility  Abnormal posture  Rationale for Evaluation and Treatment  Rehabilitation  SUBJECTIVE:                                                                                                                                                                                              SUBJECTIVE STATEMENT: Had another fall at his house. Was getting up at his house and trying to turn to turn the light off. Did not have his walker by him. When trying to turn it off fell and crushed over the table. Was bleeding from his head.  Called EMS and they checked him out and cleaned him up. Reports they did not feel like he needed stitches and did not have to go the ER. Getting on the waiting list on the [facility]. No pain or headache.   Pt accompanied by: Daughters  PERTINENT HISTORY: DaTSCAN in July 2022 showed bilateral decreased radioactive tracing in the putamen and asymmetric decreased radiotracer activity in the head of the right caudate nucleus His cognitive impairment progress quickly compared to the idiopathic typical Parkinson's patient, he seems to have less optimal response to Sinemet, differentiation diagnosis including idiopathic Parkinson's disease with cognitive impairment versus Lewy body dementia  PAIN:  Are you having pain? No  PRECAUTIONS: Fall  WEIGHT BEARING RESTRICTIONS No  FALLS: Has patient fallen in last 6 months? Yes. Number of falls ~6 or more   PLOF: Independent  PATIENT GOALS "Work on my dexterity"    OBJECTIVE:   DIAGNOSTIC FINDINGS: DaTSCAN in July 2022 showed bilateral decreased radioactive tracing in the putamen and asymmetric decreased radiotracer activity in the head of the right caudate nucleus   COGNITION: Overall cognitive status: Impaired Noted saccadic intrusions of L eye during eval   POSTURE: rounded shoulders, forward head, and flexed trunk    TODAY'S TREATMENT:  Therapeutic Activity  Pt with his wife today: Reviewed safety measures from previous session that was discussed with pt and pt's daughters:  using rollator at all times (pt reports that he is still sometimes not using his rollator at home and just ambulates with a cane or no AD). Continued to reiterate importance of using a rollator for safety to decr fall risk as he could potentially have an even more serious injury from a fall that sets him way back. Or if there is a too tight of a space where his rollator can't fit through and needs to use his cane, then will need to have someone with him providing supervision (can have a gait belt for safety) Went over safety measures - such as getting a life alert or apple watch with fall detection in case pt is alone and has a fall. They are planning on getting a life alert set up  Reviewed proper brake management with rollator prior to sit <> stands and proper UE support. Cued to use LUE to lock brakes and proper hand position to lock (using whole hand vs just his fingertips) Worked on turning with rollator with pt; worked on walking turns with rollator with cued to lead with his outside foot/foot he is turning with when going to initiate a turn and for incr foot clearance. Performed x10 reps going to each direction. Pt did well with this. Needing initial cues when turning to stay inside rollator. Worked on going slowly and focused on bigger steps when turning Worked on parking rollator at the [employer] and [employer] and performing a quarter turn to face the countertop (pt currently not bringing in rollator to the kitchen), cued for leading with the outside foot and taking a big step when turning (esp with LLE) vs. Sliding feet on the floor. Performed x10 reps going each direction. When turning, pt always having UE support either on rollator or countertop for stability. Pt reporting that at home he prepares his own breakfast and then has cereal and walks to the dining room table with no AD while carrying this. Discussed safety concerns with this as pt unable to use his rollator. Will discuss this with OT  and brainstorm ideas for safety.  PATIENT EDUCATION: Education details: See therapeutic activity section above.  Person educated: Patient and 2 daughters  Education method: Explanation, Demonstration, and Verbal cues Education comprehension: verbalized understanding, returned demonstration, verbal cues required, tactile cues required, and needs further education   HOME EXERCISE PROGRAM: Access Code: [email or web address] URL: [email or web address] Date: 02/11/2022 Prepared by: Janann August  Exercises - Sit to Stand Without Arm Support  - 1 x daily - 7 x weekly - 2 sets - 10 reps - Step sideways with arms reaching at counter   - 1 x daily - 7 x weekly - 1-2 sets - 10 reps - Alternating Step Backward with Support  - 1 x daily - 7 x weekly - 1-2 sets - 10 reps - Side to Side Weight Shift with Overhead Reach and Counter Support  - 1 x daily - 7 x weekly - 1-2 sets - 10 reps - Standing Single Leg Stance with Counter Support  - 1 x daily - 7 x weekly - 3 sets - 10 hold    GOALS: Goals reviewed with patient? Yes    UPDATED/ONGOING LTGS FOR RE-CERT LONG TERM GOALS: Target date: 03/08/22  Pt will perform final HEP w/ supervision from family for improved strength, balance, transfers and gait.  Baseline:  Goal status: IN PROGRESS  2.  Pt will improve gait velocity to at least 2.3 ft/s w/ rollator for improved gait efficiency and safety in the community  Baseline: 2.5 ft/s w/hurrycane, 1.94 ft/sec with rollator on 02/06/22  Goal status: REVISED  3.  Pt and family will verbalize understanding of local PD community resources, including fitness post DC.   Baseline:  Goal status: IN PROGRESS   ASSESSMENT:  CLINICAL IMPRESSION: Today's skilled session focused on reviewing safety measures - including using his rollator at all times, as pt's recent falls have happened when pt has not been using his rollator. Worked on safety with rollator and turning techniques during gait with  focus on slowed pace and leading with his outside first. Also worked on this at the [place] with locking brakes and then performing a quarter turn with UE support. Pt did well with this with verbal cues when having UE support. Carryover is limited between sessions due to impaired motor planning and cognitive deficits. Pt and pt's spouse in agreement to potential D/C at next session with return eval in a few months.    OBJECTIVE IMPAIRMENTS Abnormal gait, decreased balance, decreased cognition, decreased coordination, decreased knowledge of condition, decreased knowledge of use of DME, decreased mobility, difficulty walking, decreased safety awareness, impaired perceived functional ability, impaired vision/preception, and improper body mechanics.   ACTIVITY LIMITATIONS carrying, lifting, bending, standing, squatting, stairs, transfers, dressing, reach over head, hygiene/grooming, locomotion level, and caring for others  PARTICIPATION LIMITATIONS: meal prep, cleaning, laundry, medication management, personal finances, interpersonal relationship, driving, shopping, community activity, occupation, and yard work  PERSONAL FACTORS Age, Education, Fitness, Past/current experiences, Time since onset of injury/illness/exacerbation, and 1 comorbidity: Lewy Body Dementia  are also affecting patient's functional outcome.   REHAB POTENTIAL: Fair due to poor prognosis of diagnosis   CLINICAL DECISION MAKING: Evolving/moderate complexity  EVALUATION COMPLEXITY: Moderate  PLAN: PT FREQUENCY: 2x/week  PT DURATION: 4 weeks  PLANNED INTERVENTIONS: Therapeutic exercises, Therapeutic activity, Neuromuscular re-education, Balance training, Gait training, Patient/Family education, Self Care, Joint mobilization, Stair training, Vestibular training, Canalith repositioning, DME instructions, Aquatic Therapy, Dry Needling, Electrical stimulation, Cryotherapy, Moist heat, Manual therapy, and Re-evaluation  PLAN FOR  NEXT SESSION: continue to work on turns, review  HEP as needed or anything else pt wants to work on. Plan to D/C with maybe return evals in 4 vs months    Arliss Journey, PT, DPT 02/27/2022, 12:20 PM

## 2022-03-03 ENCOUNTER — Encounter: Payer: Self-pay | Admitting: Neurology

## 2022-03-04 ENCOUNTER — Ambulatory Visit: Payer: PPO | Admitting: Physical Therapy

## 2022-03-04 ENCOUNTER — Ambulatory Visit: Payer: PPO | Admitting: Occupational Therapy

## 2022-03-04 DIAGNOSIS — R2689 Other abnormalities of gait and mobility: Secondary | ICD-10-CM

## 2022-03-04 DIAGNOSIS — R2681 Unsteadiness on feet: Secondary | ICD-10-CM

## 2022-03-04 DIAGNOSIS — R29818 Other symptoms and signs involving the nervous system: Secondary | ICD-10-CM | POA: Diagnosis not present

## 2022-03-04 DIAGNOSIS — M6281 Muscle weakness (generalized): Secondary | ICD-10-CM

## 2022-03-04 NOTE — Therapy (Signed)
OUTPATIENT PHYSICAL THERAPY NEURO TREATMENT- DISCHARGE SUMMARY  Patient Name: Tony [name] MRN: [id number] DOB:1945-03-04, 77 y.o., male Today's Date: 03/04/2022   PCP: Prince Solian, MD  REFERRING PROVIDER: Marcial Pacas, MD   PHYSICAL THERAPY DISCHARGE SUMMARY  Visits from Start of Care: 21  Current functional level related to goals / functional outcomes: Pt requires Min A- Supervision with all ADLs due to impaired cognition and poor management of AD. Recommending 24/7 home care aide for home to reduce caregiver burden and increased safety in home environment.    Remaining deficits: Impaired cognition and safety awareness, poor management of AD, gait deficits 2/2 PD and high fall risk    Education / Equipment: HEP, importance of using walker at all times, [facility]    Patient agrees to discharge. Patient goals were partially met. Patient is being discharged due to maximized rehab potential.     PT End of Session - 03/04/22 1023     Visit Number 21    Number of Visits 21   Plus eval   Date for PT Re-Evaluation 40/97/35   re-cert   Authorization Type [employer] B    PT Start Time 1018    PT Stop Time 1103    PT Time Calculation (min) 45 min    Equipment Utilized During Treatment Gait belt    Activity Tolerance Patient tolerated treatment well    Behavior During Therapy Impulsive                     Past Medical History:  Diagnosis Date   Anxiety    Boil of buttock    High cholesterol    Imbalance    Low back pain    Parkinsons    Past Surgical History:  Procedure Laterality Date   CYST REMOVAL LEG  2014   Patient Active Problem List   Diagnosis Date Noted   History of falling 11/29/2021   Unsteadiness on feet 11/29/2021   Excessive sleepiness 11/08/2021   Cognitive impairment 12/25/2020   Anxiety 04/25/2020   Gait abnormality 03/30/2020   Parkinson's disease 03/30/2020   Memory loss 03/30/2020    Parkinsonism 03/30/2020   CELLULITIS AND ABSCESS OF FACE 02/01/2008   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/2006   HYPERLIPIDEMIA 06/30/2006   DIVERTICULOSIS, COLON 06/30/2006   SEBORRHEIC KERATOSIS 06/30/2006   BACK PAIN, LUMBAR 06/30/2006    ONSET DATE: 11/08/2021   REFERRING DIAG: G20 (ICD-10-CM) - Parkinson's disease R41.89 (ICD-10-CM) - Cognitive impairment F41.9 (ICD-10-CM) - Anxiety R26.9 (ICD-10-CM) - Gait abnormality G47.10 (ICD-10-CM) - Excessive sleepiness   THERAPY DIAG:  Other abnormalities of gait and mobility  Unsteadiness on feet  Muscle weakness (generalized)  Rationale for Evaluation and Treatment Rehabilitation  SUBJECTIVE:  SUBJECTIVE STATEMENT: Pt unable to stand from chair in waiting room, required min A to stand due to delayed motor planning. Pt reports he is "disoriented today" and wife very concerned that pt unable to get out of chairs now. Pt's wife reports that he has started sundowning and could not figure out how to get dressed this morning. No falls since last visit. Pt still not using walker inside the bathroom or kitchen   Pt accompanied by: Wife  PERTINENT HISTORY: DaTSCAN in July 2022 showed bilateral decreased radioactive tracing in the putamen and asymmetric decreased radiotracer activity in the head of the right caudate nucleus His cognitive impairment progress quickly compared to the idiopathic typical Parkinson's patient, he seems to have less optimal response to Sinemet, differentiation diagnosis including idiopathic Parkinson's disease with cognitive impairment versus Lewy body dementia  PAIN:  Are you having pain? No  PRECAUTIONS: Fall  WEIGHT BEARING RESTRICTIONS No  FALLS: Has patient fallen in last 6 months? Yes. Number of falls ~6  or more   PLOF: Independent  PATIENT GOALS "Work on my dexterity"    OBJECTIVE:   DIAGNOSTIC FINDINGS: DaTSCAN in July 2022 showed bilateral decreased radioactive tracing in the putamen and asymmetric decreased radiotracer activity in the head of the right caudate nucleus   COGNITION: Overall cognitive status: Impaired Noted saccadic intrusions of L eye during eval   POSTURE: rounded shoulders, forward head, and flexed trunk    TODAY'S TREATMENT:  Therapeutic Activity  Pt with his wife today: Reviewed proper Carbidopa-Levodopa dosage per Dr. Rhea Belton message. Per Dr. Krista Blue, pt to be taking 2 pills 3x/day. Pt currently taking 1.5 pills 2/day, so wife will increase dosage per MD's recommendation (see telephone encounter)  Reviewed proper sit <>stand technique, as pt having significant difficulty rising from chairs. Currently, pt is placing BUEs onto rollator (which he frequently forgets to lock) and cannot process how to stand. Encouraged pt to push up from seated surface w/RUE and place LUE on walker so he can remember to push up and lean forward. Pt practiced x6 reps using this technique from soft mat and was able to stand without issue. Wife able to teach back proper technique. Also educated wife on adding cushion/height to chairs to make it easier to stand, as standing from low surface is more challenging.  Discussed getting 24/7 home aide, as pt requires extensive assistance at home and is no longer appropriate for ILF. Pt's wife reports they have appointment at the [facility] coming up and will discuss getting care aide then.  Continued to educate pt on importance of using AD at all times. Pt reports he does not like to use in kitchen or bathroom due to the "tight space" and he does not feel like he needs it. Reminded pt that he will only get better at using AD if he uses it 24/7 and practices sharp turns/navigating obstacles. Pt's wife reports the bathroom and kitchen are not "tight spaces", pt  just refuses to use AD. Reminded pt of his high fall risk and purpose for using rollator. Pt verbalized understanding and did report he no longer is using cane as he does not feel safe with it    [facility] PT Assessment - 03/04/22 1056       Ambulation/Gait   Gait velocity 32.8' over 16.87s = 1.94 ft/s w/rollator   Min A due to impulsiveness and anterior LOB           Gait pattern: step through pattern, decreased step length- Right,  decreased step length- Left, decreased stride length, decreased hip/knee flexion- Right, decreased hip/knee flexion- Left, decreased ankle dorsiflexion- Right, decreased ankle dorsiflexion- Left, shuffling, lateral lean- Left, trunk flexed, narrow BOS, poor foot clearance- Right, and poor foot clearance- Left Distance walked: Various clinic distances  Assistive device utilized: Walker - 4 wheeled Level of assistance: CGA and Min A Comments: Pt required mod-max verbal cues for safe use of rollator today as he was leaning too far to L side and frequently kicking L wheel, resulting in LOB. Pt also leaning forward onto walker today, resulting in walker rolling away from him and requiring min A to stabilize. Provided multimodal cues for pt to stay in middle of walker and maintain upright posture, but pt unable to do so.     PATIENT EDUCATION: Education details: Continue HEP, asking about 24/7 care aide, plan for PD evals in 54mo proper use of rollator and technique for sit <>stands  Person educated: Patient and 2 daughters  Education method: Explanation, Demonstration, and Verbal cues Education comprehension: verbalized understanding, returned demonstration, verbal cues required, tactile cues required, and needs further education   HOME EXERCISE PROGRAM: Access Code: [email or web address]: [email or web address] Date: 02/11/2022 Prepared by: CJanann August Exercises - Sit to Stand Without Arm Support  - 1 x daily - 7 x weekly - 2 sets - 10 reps - Step  sideways with arms reaching at counter   - 1 x daily - 7 x weekly - 1-2 sets - 10 reps - Alternating Step Backward with Support  - 1 x daily - 7 x weekly - 1-2 sets - 10 reps - Side to Side Weight Shift with Overhead Reach and Counter Support  - 1 x daily - 7 x weekly - 1-2 sets - 10 reps - Standing Single Leg Stance with Counter Support  - 1 x daily - 7 x weekly - 3 sets - 10 hold    GOALS: Goals reviewed with patient? Yes    UPDATED/ONGOING LTGS FOR RE-CERT LONG TERM GOALS: Target date: 03/08/22  Pt will perform final HEP w/ supervision from family for improved strength, balance, transfers and gait.  Baseline:  Goal status: MET  2.  Pt will improve gait velocity to at least 2.3 ft/s w/ rollator for improved gait efficiency and safety in the community  Baseline: 2.5 ft/s w/hurrycane, 1.94 ft/sec with rollator on 02/06/22; 1.94 ft/s w/rollator  Goal status: NOT MET  3.  Pt and family will verbalize understanding of local PD community resources, including fitness post DC.   Baseline:  Goal status: MET   ASSESSMENT:  CLINICAL IMPRESSION: Emphasis of skilled PT session on LTG assessment and DC from PT. Pt has met 2 of 3 LTGs, performing his HEP regularly [facility] Pt did not meet his gait speed goal and had significant difficulty managing rollator this date. At this time, therapist recommending pt obtain 24/7 home care aide for assistance w/ADLs at home. [facility]. Continued to encourage pt to use AD at all times due to safety concerns, but pt does not use AD in bathroom or kitchen due to the "tight space". Scheduled pt for PD evals in 3 months and encouraged pt to obtain new PT referral if mobility needs change prior to that. Pt verbalized understanding and agreement to DC today.    OBJECTIVE IMPAIRMENTS Abnormal gait, decreased balance, decreased cognition, decreased  coordination, decreased knowledge  of condition, decreased knowledge of use of DME, decreased mobility, difficulty walking, decreased safety awareness, impaired perceived functional ability, impaired vision/preception, and improper body mechanics.   ACTIVITY LIMITATIONS carrying, lifting, bending, standing, squatting, stairs, transfers, dressing, reach over head, hygiene/grooming, locomotion level, and caring for others  PARTICIPATION LIMITATIONS: meal prep, cleaning, laundry, medication management, personal finances, interpersonal relationship, driving, shopping, community activity, occupation, and yard work  PERSONAL FACTORS Age, Education, Fitness, Past/current experiences, Time since onset of injury/illness/exacerbation, and 1 comorbidity: Lewy Body Dementia  are also affecting patient's functional outcome.   REHAB POTENTIAL: Fair due to poor prognosis of diagnosis   CLINICAL DECISION MAKING: Evolving/moderate complexity  EVALUATION COMPLEXITY: Moderate  PLAN: PT FREQUENCY: 2x/week  PT DURATION: 4 weeks  PLANNED INTERVENTIONS: Therapeutic exercises, Therapeutic activity, Neuromuscular re-education, Balance training, Gait training, Patient/Family education, Self Care, Joint mobilization, Stair training, Vestibular training, Canalith repositioning, DME instructions, Aquatic Therapy, Dry Needling, Electrical stimulation, Cryotherapy, Moist heat, Manual therapy, and Re-evaluation    Rosmery Duggin E Arohi Salvatierra, PT, DPT 03/04/2022, 12:27 PM

## 2022-03-06 ENCOUNTER — Encounter: Payer: PPO | Admitting: Occupational Therapy

## 2022-03-06 ENCOUNTER — Ambulatory Visit: Payer: PPO | Admitting: Speech Pathology

## 2022-03-10 ENCOUNTER — Encounter: Payer: Self-pay | Admitting: Neurology

## 2022-03-11 ENCOUNTER — Ambulatory Visit: Payer: PPO | Admitting: Occupational Therapy

## 2022-03-11 ENCOUNTER — Ambulatory Visit: Payer: PPO | Admitting: Speech Pathology

## 2022-03-11 DIAGNOSIS — R29818 Other symptoms and signs involving the nervous system: Secondary | ICD-10-CM

## 2022-03-11 DIAGNOSIS — R2681 Unsteadiness on feet: Secondary | ICD-10-CM

## 2022-03-11 DIAGNOSIS — R293 Abnormal posture: Secondary | ICD-10-CM

## 2022-03-11 DIAGNOSIS — R278 Other lack of coordination: Secondary | ICD-10-CM

## 2022-03-11 DIAGNOSIS — R2689 Other abnormalities of gait and mobility: Secondary | ICD-10-CM

## 2022-03-11 DIAGNOSIS — R41844 Frontal lobe and executive function deficit: Secondary | ICD-10-CM

## 2022-03-11 DIAGNOSIS — R41840 Attention and concentration deficit: Secondary | ICD-10-CM

## 2022-03-11 MED ORDER — CARBIDOPA-LEVODOPA 25-100 MG PO TABS: 2.0000 | ORAL_TABLET | Freq: Three times a day (TID) | ORAL | 11 refills | Status: DC

## 2022-03-11 NOTE — Therapy (Addendum)
OUTPATIENT OCCUPATIONAL THERAPY PARKINSON'S TREATMENT  Patient Name: Jaquarius Seder MRN: [id number] DOB:07/26/1945, 77 y.o., male Today's Date: 03/11/2022  PCP: Dr. Steva Ready REFERRING PROVIDER: Dr. Krista Blue   OT End of Session - 03/11/22 0939     Visit Number 7    Number of Visits 25    Date for OT Re-Evaluation 04/03/22    Authorization Type [employer]    Authorization - Visit Number 7    Authorization - Number of Visits 10    Progress Note Due on Visit 10    OT Start Time 0932    OT Stop Time 1013    OT Time Calculation (min) 41 min    Activity Tolerance Patient tolerated treatment well               Past Medical History:  Diagnosis Date   Anxiety    Boil of buttock    High cholesterol    Imbalance    Low back pain    Parkinsons    Past Surgical History:  Procedure Laterality Date   CYST REMOVAL LEG  2014   Patient Active Problem List   Diagnosis Date Noted   History of falling 11/29/2021   Unsteadiness on feet 11/29/2021   Excessive sleepiness 11/08/2021   Cognitive impairment 12/25/2020   Anxiety 04/25/2020   Gait abnormality 03/30/2020   Parkinson's disease 03/30/2020   Memory loss 03/30/2020   Parkinsonism 03/30/2020   CELLULITIS AND ABSCESS OF FACE 02/01/2008   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/2006   HYPERLIPIDEMIA 06/30/2006   DIVERTICULOSIS, COLON 06/30/2006   SEBORRHEIC KERATOSIS 06/30/2006   BACK PAIN, LUMBAR 06/30/2006    ONSET DATE: 11/29/21  REFERRING DIAG:  Diagnosis  R26.81 (ICD-10-CM) - Unsteadiness on feet  Z91.81 (ICD-10-CM) - History of falling  G20.C (ICD-10-CM) - Parkinsonism, unspecified Parkinsonism type    THERAPY DIAG:  Unsteadiness on feet  Other symptoms and signs involving the nervous system  Abnormal posture  Attention and concentration deficit  Other lack of coordination  Other abnormalities of gait and mobility  Frontal lobe and executive function deficit  Rationale  for Evaluation and Treatment: Rehabilitation  SUBJECTIVE:   SUBJECTIVE STATEMENT: Pt had 2 bad falls over Christmas per wife report Goes by [name] Pt accompanied by: wife  PERTINENT HISTORY:  per Dr. Krista Blue DaTSCAN in July 2022 showed bilateral decreased radioactive tracing in the putamen and asymmetric decreased radiotracer activity in the head of the right caudate nucleus His cognitive impairment progress quickly compared to the idiopathic typical Parkinson's patient, he seems to have less optimal response to Sinemet, differentiation diagnosis including idiopathic Parkinson's disease with cognitive impairment versus Lewy body dementia. PMH: anxiety, hyperlipidemia, back pain  PRECAUTIONS: Fall  WEIGHT BEARING RESTRICTIONS: No  PAIN:  Are you having pain? No  FALLS: Has patient fallen in last 6 months? Yes. Number of falls 5  LIVING ENVIRONMENT: Lives with: lives with their spouse, dtr's assist Lives in: [place] Stairs: No Has following equipment at home:  rollator  PLOF: Independent  PATIENT GOALS: increased ease with dressing and fine motor coordination  OBJECTIVE:   HAND DOMINANCE: Right  ADLs: Overall ADLs: Pt 's dtr's assist Transfers/ambulation related to ADLs: Eating: increased spills needs assist with cutting food Grooming: mod I UB Dressing:  able to donn shirt with setup, difficulty donning/ doffing jacket LB Dressing: min A pants/ socks , uses slip on shoes,  Toileting: mod I with rollator  Bathing: distant supervision Tub Shower transfers:stepping into shower /tub  needs supervision, has grab bar   IADLs: Goes to PD cycle on Tues/ Thurs Handwriting: 100% legible  MOBILITY STATUS: Hx of falls, start hesitation, and uses rollator  POSTURE COMMENTS:  rounded shoulders and forward head   FUNCTIONAL OUTCOME MEASURES: Fastening/unfastening 3 buttons: unable Physical performance test: PPT#2 (simulated eating) 28.65 & PPT#4 (donning/doffing jacket): NT 01/30/22:  PPT#4 = 1 min. 22 sec  COORDINATION: 9 Hole Peg test: Right: 69.57 sec; Left: 2 mins 20  sec Box and Blocks:  Right 21 blocks, Left 11blocks Resting tremor LUE UE ROM:   shoulder flexion: RUE 130. LUE 125 elbow flexion/ extension WFLS   UE MMT:   Not tested  SENSATION: Not tested  Vision:not formally tested, TBA in functional context  COGNITION: Overall cognitive status: Impaired  OBSERVATIONS: Bradykinesia   TODAY'S TREATMENT:        Pt was accompanied by 1 of his daughter's today. Pt reports difficulty getting up and going in the a.m Therapist suggests that pt takes his carbidopa levadopa prior to getting out of bed to help with ADLS' and mobility. Therapist also recommends use of a 3 in 1 at bedside at night for patient safety as he reports difficulty getting to the BR. Pt practiced a stand pivot transfer to and from bedside commode with supervision min v.c Pt performed PWR! hands with mod difficulty for LUE, v.c to make LUE do the same as right hand Doffing and donning socks with big movements, min-mod tactile and v.c  increased time required. Bag exercise crumpling bag for simulated donning socks with bilateral UE's mod difficulty with LUE.      PATIENT EDUCATION: Education details: see above Person educated: Patient and Spouse Education method: Explanation, Demonstration, Verbal cues, and Handouts Education comprehension: verbalized understanding, returned demonstration, verbal cues required, and needs further education   HOME EXERCISE PROGRAM: 02/11/22: bag exercises, strategies for donning/doffing jacket, donning/doffing pants 02/13/22: Coordination HEP   GOALS:  Goals reviewed with patient? Yes  SHORT TERM GOALS: Target date:03/04/22- extended due to scheduling  I with PD specific HEP Baseline: Goal status: in progress  2.  Pt will verbalize understanding of adapted strategies to maximize safety and I with ADLs/ [place] to minimize fall risk . Baseline:   Goal status: in progress  3.  Pt will demonstrate improved ease with feeding as evidenced by decreasing PPT#2 (self feeding) by 3 secs Baseline:  Goal status: INITIAL  4.  Pt will demonstrate improved UE functional use as evidenced by increasing box and blocks score by 4 blocks bilaterally. Baseline:  Right 21 blocks, Left 11 blocks Goal status: INITIAL  5.    Improve PPT#4 to under 1 minute Baseline: 1 min. 22 sec Goal status: in progress    LONG TERM GOALS: Target date: 04/03/22  Pt will verbalize understanding of ways to prevent future  PD related complications and PD community resources. Baseline:  Goal status: INITIAL  2. Pt will demonstrate improved fine motor coordination for ADLs as evidenced by decreasing 9 hole peg test score for RUE by 4 secs Baseline: 9 Hole Peg test: Right: 69.57 sec; Left: 2 mins 20  sec Goal status: INITIAL  3.  Pt will demonstrate improved fine motor coordination for ADLs as evidenced by decreasing 9 hole peg test score for LUE  to 2 mins or less Baseline: 9 Hole Peg test: Right: 69.57 sec; Left: 2 mins 20  sec Goal status: INITIAL  4.Pt will demonstrate understanding of memory compensations and ways to keep thinking  skills sharp Baseline:  Goal status: INITIAL   ASSESSMENT:  CLINICAL IMPRESSION: Pt slowly progressing towards goals. He benefits from repetition and review of big movement strategies due to cognitive deficits  PERFORMANCE DEFICITS: in functional skills including ADLs, IADLs, coordination, dexterity, strength, flexibility, Fine motor control, Gross motor control, mobility, balance, endurance, decreased knowledge of precautions, decreased knowledge of use of DME, and UE functional use, cognitive skills including memory, problem solving, safety awareness, and thought, and psychosocial skills including coping strategies, environmental adaptation, habits, interpersonal interactions, and routines and behaviors.   IMPAIRMENTS: are  limiting patient from ADLs, IADLs, play, leisure, and social participation.   COMORBIDITIES:  may have co-morbidities  that affects occupational performance. Patient will benefit from skilled OT to address above impairments and improve overall function.  MODIFICATION OR ASSISTANCE TO COMPLETE EVALUATION: No modification of tasks or assist necessary to complete an evaluation.  OT OCCUPATIONAL PROFILE AND HISTORY: Detailed assessment: Review of records and additional review of physical, cognitive, psychosocial history related to current functional performance.  CLINICAL DECISION MAKING: LOW - limited treatment options, no task modification necessary  REHAB POTENTIAL: Good  EVALUATION COMPLEXITY: Low    PLAN:  OT FREQUENCY: 2x/week  OT DURATION: 12 weeks plus eval to account for scheduling  PLANNED INTERVENTIONS: self care/ADL training, therapeutic exercise, therapeutic activity, neuromuscular re-education, manual therapy, passive range of motion, gait training, balance training, functional mobility training, aquatic therapy, splinting, ultrasound, paraffin, fluidotherapy, moist heat, cryotherapy, patient/family education, cognitive remediation/compensation, visual/perceptual remediation/compensation, energy conservation, coping strategies training, DME and/or AE instructions, and Re-evaluation  RECOMMENDED OTHER SERVICES: PT, ST  CONSULTED AND AGREED WITH PLAN OF CARE: Patient and family member/caregiver  PLAN FOR NEXT SESSION: start checking short term goals,  rollator safety   Zyanna Leisinger, OT 03/11/2022, 9:41 AM

## 2022-03-13 ENCOUNTER — Encounter: Payer: PPO | Admitting: Speech Pathology

## 2022-03-13 ENCOUNTER — Encounter: Payer: PPO | Admitting: Occupational Therapy

## 2022-03-14 ENCOUNTER — Encounter: Payer: Self-pay | Admitting: Occupational Therapy

## 2022-03-14 ENCOUNTER — Ambulatory Visit: Payer: PPO | Admitting: Occupational Therapy

## 2022-03-14 ENCOUNTER — Ambulatory Visit: Payer: PPO | Admitting: Speech Pathology

## 2022-03-14 DIAGNOSIS — R278 Other lack of coordination: Secondary | ICD-10-CM

## 2022-03-14 DIAGNOSIS — R41841 Cognitive communication deficit: Secondary | ICD-10-CM

## 2022-03-14 DIAGNOSIS — R41844 Frontal lobe and executive function deficit: Secondary | ICD-10-CM

## 2022-03-14 DIAGNOSIS — R29818 Other symptoms and signs involving the nervous system: Secondary | ICD-10-CM | POA: Diagnosis not present

## 2022-03-14 DIAGNOSIS — R2689 Other abnormalities of gait and mobility: Secondary | ICD-10-CM

## 2022-03-14 DIAGNOSIS — R293 Abnormal posture: Secondary | ICD-10-CM

## 2022-03-14 DIAGNOSIS — R1310 Dysphagia, unspecified: Secondary | ICD-10-CM

## 2022-03-14 DIAGNOSIS — R41840 Attention and concentration deficit: Secondary | ICD-10-CM

## 2022-03-14 DIAGNOSIS — R471 Dysarthria and anarthria: Secondary | ICD-10-CM

## 2022-03-14 DIAGNOSIS — R2681 Unsteadiness on feet: Secondary | ICD-10-CM

## 2022-03-14 NOTE — Patient Instructions (Signed)
WHEN YOU NEED HELP UNDERSTANDING  Ask for a minute to process "hold on, I need a minute"  Ask for repetition "what did you say?" "Can you explain that"   Repeat what you DID understand to see if you are on track  [place]  Let people know when you don't think it came out quite right  This gives them the opportunity to express their understanding. Then you can either confirm or clarify your point.   DINNER WITH YOUR FRIENDS  Make sure you sit next to the person you want to talk to

## 2022-03-14 NOTE — Therapy (Signed)
OUTPATIENT OCCUPATIONAL THERAPY PARKINSON'S TREATMENT  Patient Name: Tony [name] MRN: [id number] DOB:28-Dec-1945, 77 y.o., male Today's Date: 03/14/2022  PCP: Dr. Steva Ready REFERRING PROVIDER: Dr. Krista Blue   OT End of Session - 03/14/22 1150     Visit Number 8    Number of Visits 25    Date for OT Re-Evaluation 04/03/22    Authorization Type [employer]    Authorization - Visit Number 8    Authorization - Number of Visits 10    Progress Note Due on Visit 10    OT Start Time 1152    OT Stop Time 1230    OT Time Calculation (min) 38 min    Activity Tolerance Patient tolerated treatment well                Past Medical History:  Diagnosis Date   Anxiety    Boil of buttock    High cholesterol    Imbalance    Low back pain    Parkinsons    Past Surgical History:  Procedure Laterality Date   CYST REMOVAL LEG  2014   Patient Active Problem List   Diagnosis Date Noted   History of falling 11/29/2021   Unsteadiness on feet 11/29/2021   Excessive sleepiness 11/08/2021   Cognitive impairment 12/25/2020   Anxiety 04/25/2020   Gait abnormality 03/30/2020   Parkinson's disease 03/30/2020   Memory loss 03/30/2020   Parkinsonism 03/30/2020   CELLULITIS AND ABSCESS OF FACE 02/01/2008   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/2006   HYPERLIPIDEMIA 06/30/2006   DIVERTICULOSIS, COLON 06/30/2006   SEBORRHEIC KERATOSIS 06/30/2006   BACK PAIN, LUMBAR 06/30/2006    ONSET DATE: 11/29/21  REFERRING DIAG:  Diagnosis  R26.81 (ICD-10-CM) - Unsteadiness on feet  Z91.81 (ICD-10-CM) - History of falling  G20.C (ICD-10-CM) - Parkinsonism, unspecified Parkinsonism type    THERAPY DIAG:  Unsteadiness on feet  Other symptoms and signs involving the nervous system  Abnormal posture  Other lack of coordination  Frontal lobe and executive function deficit  Other abnormalities of gait and mobility  Attention and concentration deficit  Rationale  for Evaluation and Treatment: Rehabilitation  SUBJECTIVE:   SUBJECTIVE STATEMENT: Has incr Sinemet to 2 pills 3x/day.  No new falls.  [name] Pt accompanied by: wife  PERTINENT HISTORY:  per Dr. Krista Blue [employer] in July 2022 showed bilateral decreased radioactive tracing in the putamen and asymmetric decreased radiotracer activity in the head of the right caudate nucleus His cognitive impairment progress quickly compared to the idiopathic typical Parkinson's patient, he seems to have less optimal response to Sinemet, differentiation diagnosis including idiopathic Parkinson's disease with cognitive impairment versus Lewy body dementia. PMH: anxiety, hyperlipidemia, back pain  PRECAUTIONS: Fall  WEIGHT BEARING RESTRICTIONS: No  PAIN:  Are you having pain? No  FALLS: Has patient fallen in last 6 months? Yes. Number of falls 5  LIVING ENVIRONMENT: Lives with: lives with their spouse, dtr's assist Lives in: [place] Stairs: No Has following equipment at home:  rollator  PLOF: Independent  PATIENT GOALS: increased ease with dressing and fine motor coordination  OBJECTIVE:   HAND DOMINANCE: Right  ADLs: Overall ADLs: Pt 's dtr's assist Transfers/ambulation related to ADLs: Eating: increased spills needs assist with cutting food Grooming: mod I UB Dressing:  able to donn shirt with setup, difficulty donning/ doffing jacket LB Dressing: min A pants/ socks , uses slip on shoes,  Toileting: mod I with rollator  Bathing: distant supervision Tub Shower transfers:stepping  into shower /tub needs supervision, has grab bar   IADLs: Goes to PD cycle on Tues/ Thurs Handwriting: 100% legible  MOBILITY STATUS: Hx of falls, start hesitation, and uses rollator  POSTURE COMMENTS:  rounded shoulders and forward head   FUNCTIONAL OUTCOME MEASURES: Fastening/unfastening 3 buttons: unable Physical performance test: PPT#2 (simulated eating) 28.65 & PPT#4 (donning/doffing jacket):  NT 01/30/22: PPT#4 = 1 min. 22 sec  COORDINATION: 9 Hole Peg test: Right: 69.57 sec; Left: 2 mins 20  sec Box and Blocks:  Right 21 blocks, Left 11blocks Resting tremor LUE UE ROM:   shoulder flexion: RUE 130. LUE 125 elbow flexion/ extension WFLS   UE MMT:   Not tested  SENSATION: Not tested  Vision:not formally tested, TBA in functional context  COGNITION: Overall cognitive status: Impaired  OBSERVATIONS: Bradykinesia   TODAY'S TREATMENT:        Practiced donning/doffing jacket multiple times with mod cueing/min A for adaptive strategies, large amplitude movements/feet apart.  Did best with pulling across with collar to don.  Sit>stand and stand>sit with min cueing for feet apart/large amplitude.  Emphasized/educated pt on keeping feet apart prior to standing and pausing prior to movement.  Also recommended that pt use RW to ambulate to/from bathroom at night.    Educated pt/wife of recommendation to take Sinemet 48mn prior to meal or at least 1 hr after meal per [facility] Healthrecommendations  and discussed timing of medications--evenly distributed during waking hours (discuss with MD if questions).    Assessed visual ROM.  Pt able to track in all directions but did observe decr smoothness noted intermittently when crossing primary gaze.  Pt denies diplopia.    Closed-chain shoulder flex in sitting with ball (floor>overhead) with min-mod cueing for normal movement patterns/large amplitude movements with LUE for grasp/finger ext and reach.     PATIENT EDUCATION: Education details:  see above Person educated: Patient and Spouse Education method: Explanation, Demonstration, and Verbal cues Education comprehension: verbalized understanding, returned demonstration, verbal cues required, and needs further education   HOME EXERCISE PROGRAM: 02/11/22: bag exercises, strategies for donning/doffing jacket, donning/doffing pants 02/13/22: Coordination HEP    GOALS:  Goals reviewed with patient? Yes  SHORT TERM GOALS: Target date:03/04/22- extended due to scheduling  I with PD specific HEP Baseline: Goal status: in progress  2.  Pt will verbalize understanding of adapted strategies to maximize safety and I with ADLs/ [place] to minimize fall risk . Baseline:  Goal status: 03/14/22 in progress, needs reinforcement  3.  Pt will demonstrate improved ease with feeding as evidenced by decreasing PPT#2 (self feeding) by 3 secs Baseline:  Goal status: INITIAL  4.  Pt will demonstrate improved UE functional use as evidenced by increasing box and blocks score by 4 blocks bilaterally. Baseline:  Right 21 blocks, Left 11 blocks Goal status: INITIAL  5.    Improve PPT#4 to under 1 minute Baseline: 1 min. 22 sec Goal status: 03/14/22 needs cueing for strategies, >14m    LONG TERM GOALS: Target date: 04/03/22  Pt will verbalize understanding of ways to prevent future  PD related complications and PD community resources. Baseline:  Goal status: INITIAL  2. Pt will demonstrate improved fine motor coordination for ADLs as evidenced by decreasing 9 hole peg test score for RUE by 4 secs Baseline: 9 Hole Peg test: Right: 69.57 sec; Left: 2 mins 20  sec Goal status: INITIAL  3.  Pt will demonstrate improved fine motor coordination for ADLs as evidenced by decreasing  9 hole peg test score for LUE  to 2 mins or less Baseline: 9 Hole Peg test: Right: 69.57 sec; Left: 2 mins 20  sec Goal status: INITIAL  4.Pt will demonstrate understanding of memory compensations and ways to keep thinking skills sharp Baseline:  Goal status: INITIAL   ASSESSMENT:  CLINICAL IMPRESSION:  Pt slowly progressing towards goals. He benefits from repetition and review of big movement strategies due to cognitive deficits to incr safety.  PERFORMANCE DEFICITS: in functional skills including ADLs, IADLs, coordination, dexterity, strength, flexibility, Fine motor control,  Gross motor control, mobility, balance, endurance, decreased knowledge of precautions, decreased knowledge of use of DME, and UE functional use, cognitive skills including memory, problem solving, safety awareness, and thought, and psychosocial skills including coping strategies, environmental adaptation, habits, interpersonal interactions, and routines and behaviors.   IMPAIRMENTS: are limiting patient from ADLs, IADLs, play, leisure, and social participation.   COMORBIDITIES:  may have co-morbidities  that affects occupational performance. Patient will benefit from skilled OT to address above impairments and improve overall function.  MODIFICATION OR ASSISTANCE TO COMPLETE EVALUATION: No modification of tasks or assist necessary to complete an evaluation.  OT OCCUPATIONAL PROFILE AND HISTORY: Detailed assessment: Review of records and additional review of physical, cognitive, psychosocial history related to current functional performance.  CLINICAL DECISION MAKING: LOW - limited treatment options, no task modification necessary  REHAB POTENTIAL: Good  EVALUATION COMPLEXITY: Low    PLAN:  OT FREQUENCY: 2x/week  OT DURATION: 12 weeks plus eval to account for scheduling  PLANNED INTERVENTIONS: self care/ADL training, therapeutic exercise, therapeutic activity, neuromuscular re-education, manual therapy, passive range of motion, gait training, balance training, functional mobility training, aquatic therapy, splinting, ultrasound, paraffin, fluidotherapy, moist heat, cryotherapy, patient/family education, cognitive remediation/compensation, visual/perceptual remediation/compensation, energy conservation, coping strategies training, DME and/or AE instructions, and Re-evaluation  RECOMMENDED OTHER SERVICES: PT, ST  CONSULTED AND AGREED WITH PLAN OF CARE: Patient and family member/caregiver  PLAN FOR NEXT SESSION: continue checking short term goals,  rollator safety   Chane Cowden,  OTR/L 03/14/2022, 11:55 AM

## 2022-03-14 NOTE — Therapy (Signed)
OUTPATIENT SPEECH LANGUAGE PATHOLOGY TREATMENT NOTE   Patient Name: Tony [name] MRN: [id number] DOB:April 04, 1945, 77 y.o., male Today's Date: 03/14/2022  PCP: Prince Solian, MD REFERRING PROVIDER: Marcial Pacas, MD    End of Session - 03/14/22 1155     Visit Number 12    Number of Visits 17    Date for SLP Re-Evaluation 03/22/22    Authorization Type [employer]    SLP Start Time 7829    SLP Stop Time  1315    SLP Time Calculation (min) 45 min    Activity Tolerance Patient tolerated treatment well                  Past Medical History:  Diagnosis Date   Anxiety    Boil of buttock    High cholesterol    Imbalance    Low back pain    Parkinsons    Past Surgical History:  Procedure Laterality Date   CYST REMOVAL LEG  2014   Patient Active Problem List   Diagnosis Date Noted   History of falling 11/29/2021   Unsteadiness on feet 11/29/2021   Excessive sleepiness 11/08/2021   Cognitive impairment 12/25/2020   Anxiety 04/25/2020   Gait abnormality 03/30/2020   Parkinson's disease 03/30/2020   Memory loss 03/30/2020   Parkinsonism 03/30/2020   CELLULITIS AND ABSCESS OF FACE 02/01/2008   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   THALASSEMIA [place] 09/12/2006   HYPERLIPIDEMIA 06/30/2006   DIVERTICULOSIS, COLON 06/30/2006   SEBORRHEIC KERATOSIS 06/30/2006   BACK PAIN, LUMBAR 06/30/2006    ONSET DATE: 11/29/2021 referral  REFERRING DIAG: R26.81 (ICD-10-CM) - Unsteadiness on feet Z91.81 (ICD-10-CM) - History of falling G20.C (ICD-10-CM) - Parkinsonism, unspecified Parkinsonism type   THERAPY DIAG:  Dysarthria and anarthria  Cognitive communication deficit  Dysphagia, unspecified type  Rationale for Evaluation and Treatment Rehabilitation  SUBJECTIVE:   SUBJECTIVE STATEMENT: Report visual aid effective for getting [name] to raise his volume  PAIN:  Are you having pain? No   OBJECTIVE:   TODAY'S TREATMENT:     03/14/22: [name] reports carryover of  several strategies previously targeted with success. Are using visual cues to A in increased volume, Pena board, extra time, and shorter phrases to aid in processing. Provided education on strategies pt can employ to increase successful participation and confidence in communication opportunities: ask for time/clarification, tell what you did understand, let partner know when your words didn't come out quite right, facilitate one-on-one conversations when able. SLP highlighted pt employment of strategies throughout therapy session with support of visual A, simple language, short phrases to enhance understanding. Provided feedback to [name] on her use of short phrases and repetition when giving instructions and conversing with [name]. SLP re-educated on online Speak Out! Practice resource. Demonstrated navigation to website and showed where info could be found in workbook. Encouraged [name] to complete 1-2 virtual practice sessions before next ST visit.   02/13/22: [name] enters room with sub WNL volume at 65dB average.   Targeted volume and intelligibility using Speak Out! Lesson 12. Pt required inital verbal cues, modeling, for volume and breath support, faded to rare min visual cues for cognitive exercise.   Pt averages the following volume levels:  Sustained AH: 92dB  Counting:88dB  Reading (phrases): 75dB  Cognitive Exercise: 70dB Required occasional verbal cues, modeling for carryover of intent /volume during 15 minute conversation.    02/11/22: [name] required 1 step cues to park walker and sit with safety precautions. He entered room with sub WNL  volume.  Targeted volume and intelligibility using Speak Out! Lesson 11. Pt required occasoinal verbal cues, modeling, for volume and breath support.   Pt averages the following volume levels:  Sustained AH: 92dB  Counting:81 dB  Reading (short story): 73dB  Cognitive Exercise: 68dB Required usual min to mod verbal cues, modeling for carryover of intent  /volume generating 3 sentence descriptions for common objects and participating in 5 min simple conversation with range of 67 to 72dB. Ongoing review of caregiver supports to compensate for slow processing of verbal information and slow response time in conversations.    12/13/23[name], spouse present for caregiver training. I demonstrated and instructed on using short phrases with extended pauses, giving 1 direction at a time, using Prieur board to give written cues and intentionally bringing [name] into a conversation. [name] agreed that she gets frustrated when [name] is slow to respond and this causes disagreements in the home. Educated how her expectation of faster processing and responding exacerbates [name]'s processing difficulties and results in him not being able to respond. Explained and modeled giving [name] extra time to answer a question, and how when he doesn't respond right a way, asking another question or follow up question makes his processing even more difficult. They Id'd that bill paying was resulting in communication breakdown because [name] could not explain to [name] how to pay a bill and the steps to take to access online, auto payments and phone payments. I provided a log of bills, due dates, method of payment, amount and passwords. They will work with their daughter, Nira Conn who helps with bills, to fill out the chart to support [name]'s communication and processing as he instructs [name] on bill paying. Speak Out completed (modified to 3 reps of warm ups, 5 sentences and 3 cognitive activities due to time, so [name] could hear how loud he is supposed to be. They agree he is not completing Speak Out as loud at home. I demonstrated cueing - 1 cue, with a pause for cognitive activity of naming 3 items in a category. They reports they attempted to go to game night at church, but this was too much for [name]. I encouraged them to have game night at home with no timers, slow pace and short cues for [name] to allow him  to participate in game night as he enjoys this. Provided list of cognitive activities for game night . See pt instructions.   01/30/22: Santiago Glad, [name]'s daughter attended session today. Session focused on family and environmental strategies to support [name]'s processing, attention and communication. [name] initially came in and was given multiple directions on how to sit, where to put his walker and where to sit. [name] became confused and used unsafe sitting decisions. This was used as an example to provide [name] 1 instruction at a time, pause, allowing him to complete 1 step, then giving the next direction. I provided written steps for safe sitting and standing for [name] to read and process at his own speed. I also demonstrated use of Dobbin board to support [name] in processing directions, topics, choices, and schedule changes. We reviewed Monday's example of [name] receiving a phone call that PT was cancelled, the fronto office understood that [name] also wanted to cancel ST, his ST appointment was cancelled, then he showed up for the [place] appointment. I demonstrated to  [name] and Santiago Glad how writing this info down for him can help communication breakdowns. I Demonstrated extra pauses and use of gestures in conversation, explaining to both  of them that it feels  unnatural, but pauses give [name] a chance to understand and process conversation better. We demonstrated how giving a pause helped [name] answer questions accurately, rather than immediately rephrasing the question, which results in breakdown of processing. [name] hit targets in [place] and [name] endorse he is not as loud at home. Santiago Glad aware to cue his volume at home. Demonstrated Sound level meter, 90 dB vs 70dB and showed Santiago Glad the targets for each exercise. [name] maintained 70-72dB in short conversation re: [place] football and the play offs with rare min A, with extended time. Encouraged Santiago Glad to share the handout (See patient instructions) with her family. Also educated [name]  and Santiago Glad how flat affect and monotone speech can negatively affect a listeners perception of [name]'s interest and desire to communicate. Encouraged [name] to self advocate when he is not understanding conversation or information.   PATIENT EDUCATION: Education details: see above Person educated: Patient and Spouse Education method: Customer service manager Education comprehension: verbalized understanding, returned demonstration, and needs further education  HOME EXERCISE PROGRAM: Speak Out  GOALS: Goals reviewed with patient? Yes  SHORT TERM GOALS: Target date: 01/14/2022  Pt will complete clinical swallow evaluation (CSE) to assess need for objective swallow evaluation  Baseline: 12/24/2021 Goal status: MET  2.  Pt will complete instrumental swallow evaluation, if indicated per CSE to objectively assess swallow function and efficacy Baseline:  Goal status: deferred d/t CSE results  3.  Pt will demonstrate communication strategies to aid in attention, processing, and coherent discourse in 15 minute conversation with occasional min-A Baseline:  Goal status: MET  4.  Pt will meet dB targets for warm up and counting Speak Out exercises with rare min-A over 1 week period Baseline:  Goal status: MET  5.  Pt's care partner will teach back strategies and compensations to aid in pt's improved safety whilst eating meals at home over 1 week period Baseline:  Goal status: deferred   LONG TERM GOALS: Target date: 2/42/35 - (recert completed 36/14/43)  Pt will report daily HEP adherence for dysphagia and dysarthria with min-A from care partner over 1 week period Baseline:  Goal status: ONGOING  2.  Pt's average volume will exceed 68 dB in answering simple, 10 minute conversation, such as answering personally relevant questions with occasional min-A over 2 sessions Baseline:  Goal status: ONGOING  3.  Pt will demonstrate saliva management techniques with compensations PRN and rare  verbal cues over 30 minutes Baseline:  Goal status: ONGOING  4.  Pt's care partner will report carryover of strategies and compensations to aid in improved safety whilst eating meals at home and subjective report of reduced instances of coughing during meal times over 1 week period Baseline:  Goal status: ONGOING  5.  Pt's volume will average greater than 70 dB during Speak Out reading and cognitive exercises with occasional min-A over 2 sessions  Baseline:  Goal status: ONGOING   ASSESSMENT:  CLINICAL IMPRESSION: Patient is a 77 y.o. 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 Pt presents with mild dysarthria, cognitive impairment, and dysphagia. arily by reduced volume and imprecise articulation. Ongoing training in HEP for dysarthria, compensations for intelligibility, attention, processing, and participation in conversations. Family/caregiver training to support communication and cognition given to spouse and 1 daughter, Santiago Glad. I am requesting his other daughter, Nira Conn, attend 1-2 sessions for training on providing  supports to help [name] participate in conversations due to slow processing and to cue him for volume in HEP and conversation while reducing the cognitive load of processing when interrupted to speak louder. He is hitting the targeted dB in HEP (speak out!) in ST sessions with occasional min A, however he is not hitting dB targets at home. [name] continues to report difficulty hearing [name] at home. Continued Skilled ST is indicated to maximize intelligibility, cognition for safety, reduce caregiver burden and independence . Continue LTG's, recert completed today (02/11/22) through 03/22/21.  OBJECTIVE IMPAIRMENTS: Objective impairments include attention, memory, executive functioning, expressive language, dysarthria, and dysphagia. These impairments are limiting patient from  managing medications, managing appointments, managing finances, household responsibilities, ADLs/IADLs, effectively communicating at home and in community, and safety when swallowing.Factors affecting potential to achieve goals and functional outcome are ability to learn/carryover information and medical prognosis.. Patient will benefit from skilled SLP services to address above impairments and improve overall function.  REHAB POTENTIAL: Fair (degenerative dx)  PLAN:  SLP FREQUENCY: 2x/week  SLP DURATION: 8 weeks  PLANNED INTERVENTIONS: Aspiration precaution training, Pharyngeal strengthening exercises, Diet toleration management , Language facilitation, Environmental controls, Trials of upgraded texture/liquids, Cueing hierachy, Cognitive reorganization, Internal/external aids, Functional tasks, SLP instruction and feedback, Compensatory strategies, Patient/family education, and Re-evaluation    Su Monks, CCC-SLP 03/14/2022, 11:56 AM

## 2022-03-18 ENCOUNTER — Ambulatory Visit: Payer: PPO | Admitting: Speech Pathology

## 2022-03-18 ENCOUNTER — Encounter: Payer: Self-pay | Admitting: Speech Pathology

## 2022-03-18 ENCOUNTER — Ambulatory Visit: Payer: PPO | Admitting: Occupational Therapy

## 2022-03-18 DIAGNOSIS — R29818 Other symptoms and signs involving the nervous system: Secondary | ICD-10-CM | POA: Diagnosis not present

## 2022-03-18 DIAGNOSIS — R2681 Unsteadiness on feet: Secondary | ICD-10-CM

## 2022-03-18 DIAGNOSIS — R41844 Frontal lobe and executive function deficit: Secondary | ICD-10-CM

## 2022-03-18 DIAGNOSIS — R278 Other lack of coordination: Secondary | ICD-10-CM

## 2022-03-18 DIAGNOSIS — R471 Dysarthria and anarthria: Secondary | ICD-10-CM

## 2022-03-18 DIAGNOSIS — R41841 Cognitive communication deficit: Secondary | ICD-10-CM

## 2022-03-18 DIAGNOSIS — R293 Abnormal posture: Secondary | ICD-10-CM

## 2022-03-18 NOTE — Therapy (Signed)
OUTPATIENT OCCUPATIONAL THERAPY PARKINSON'S TREATMENT  Patient Name: Tony [name] MRN: [id number] DOB:Dec 08, 1945, 77 y.o., male Today's Date: 03/18/2022  PCP: Dr. Steva Ready REFERRING PROVIDER: Dr. Krista Blue   OT End of Session - 03/18/22 0949     Visit Number 9    Number of Visits 25    Date for OT Re-Evaluation 04/03/22    Authorization Type [employer]    Authorization - Visit Number 9    Authorization - Number of Visits 10    Progress Note Due on Visit 10    OT Start Time 0948    OT Stop Time 1015    OT Time Calculation (min) 27 min    Equipment Utilized During Treatment Goes by [name]    Activity Tolerance Patient tolerated treatment well    Behavior During Therapy Impulsive                Past Medical History:  Diagnosis Date   Anxiety    Boil of buttock    High cholesterol    Imbalance    Low back pain    Parkinsons    Past Surgical History:  Procedure Laterality Date   CYST REMOVAL LEG  2014   Patient Active Problem List   Diagnosis Date Noted   History of falling 11/29/2021   Unsteadiness on feet 11/29/2021   Excessive sleepiness 11/08/2021   Cognitive impairment 12/25/2020   Anxiety 04/25/2020   Gait abnormality 03/30/2020   Parkinson's disease 03/30/2020   Memory loss 03/30/2020   Parkinsonism 03/30/2020   CELLULITIS AND ABSCESS OF FACE 02/01/2008   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/2006   HYPERLIPIDEMIA 06/30/2006   DIVERTICULOSIS, COLON 06/30/2006   SEBORRHEIC KERATOSIS 06/30/2006   BACK PAIN, LUMBAR 06/30/2006    ONSET DATE: 11/29/21  REFERRING DIAG:  Diagnosis  R26.81 (ICD-10-CM) - Unsteadiness on feet  Z91.81 (ICD-10-CM) - History of falling  G20.C (ICD-10-CM) - Parkinsonism, unspecified Parkinsonism type    THERAPY DIAG:  Unsteadiness on feet  Abnormal posture  Frontal lobe and executive function deficit  Other lack of coordination  Rationale for Evaluation and Treatment:  Rehabilitation  SUBJECTIVE:   SUBJECTIVE STATEMENT: Has incr Sinemet to 2 pills 3x/day.  No new falls.  Goes by [name] Pt accompanied by: wife  PERTINENT HISTORY:  per Dr. Krista Blue [facility] in July 2022 showed bilateral decreased radioactive tracing in the putamen and asymmetric decreased radiotracer activity in the head of the right caudate nucleus His cognitive impairment progress quickly compared to the idiopathic typical Parkinson's patient, he seems to have less optimal response to Sinemet, differentiation diagnosis including idiopathic Parkinson's disease with cognitive impairment versus Lewy body dementia. PMH: anxiety, hyperlipidemia, back pain  PRECAUTIONS: Fall  WEIGHT BEARING RESTRICTIONS: No  PAIN:  Are you having pain? No  FALLS: Has patient fallen in last 6 months? Yes. Number of falls 5  LIVING ENVIRONMENT: Lives with: lives with their spouse, dtr's assist Lives in: [place] Stairs: No Has following equipment at home:  rollator  PLOF: Independent  PATIENT GOALS: increased ease with dressing and fine motor coordination  OBJECTIVE:   HAND DOMINANCE: Right  ADLs: Overall ADLs: Pt 's dtr's assist Transfers/ambulation related to ADLs: Eating: increased spills needs assist with cutting food Grooming: mod I UB Dressing:  able to donn shirt with setup, difficulty donning/ doffing jacket LB Dressing: min A pants/ socks , uses slip on shoes,  Toileting: mod I with rollator  Bathing: distant supervision Tub Shower transfers:stepping into shower /tub needs  supervision, has grab bar   IADLs: Goes to PD cycle on Tues/ Thurs Handwriting: 100% legible  MOBILITY STATUS: Hx of falls, start hesitation, and uses rollator  POSTURE COMMENTS:  rounded shoulders and forward head   FUNCTIONAL OUTCOME MEASURES: Fastening/unfastening 3 buttons: unable Physical performance test: PPT#2 (simulated eating) 28.65 & PPT#4 (donning/doffing jacket): NT 01/30/22: PPT#4 = 1 min. 22  sec  COORDINATION: 9 Hole Peg test: Right: 69.57 sec; Left: 2 mins 20  sec Box and Blocks:  Right 21 blocks, Left 11blocks Resting tremor LUE UE ROM:   shoulder flexion: RUE 130. LUE 125 elbow flexion/ extension WFLS   UE MMT:   Not tested  SENSATION: Not tested  Vision:not formally tested, TBA in functional context  COGNITION: Overall cognitive status: Impaired  OBSERVATIONS: Bradykinesia   TODAY'S TREATMENT:        Practiced donning/doffing jacket multiple times with mod cueing/min A for adaptive strategies, large amplitude movements/feet apart.  Did best with pulling across with collar to don (arms making "X" to start) and doffing with arms in front.  Reviewed safety/fall prevention, safety with rollator. Educated wife that cueing needs to be consistent among family /caregivers and direct cueing/supervision. Pt also needs hand over hand assist at times to begin donning jacket.   Began assessing Box & Blocks Rt = 19    PATIENT EDUCATION: Education details:  see above Person educated: Patient and Spouse Education method: Explanation, Demonstration, and Verbal cues Education comprehension: verbalized understanding, returned demonstration, verbal cues required, and needs further education   HOME EXERCISE PROGRAM: 02/11/22: bag exercises, strategies for donning/doffing jacket, donning/doffing pants 02/13/22: Coordination HEP   GOALS:  Goals reviewed with patient? Yes  SHORT TERM GOALS: Target date:03/04/22- extended due to scheduling  I with PD specific HEP Baseline: Goal status: MET  2.  Pt will verbalize understanding of adapted strategies to maximize safety and I with ADLs/ [place] to minimize fall risk . Baseline:  Goal status: MET, however d/t cognitive deficits will always need reminders/cues from family/caregivers  3.  Pt will demonstrate improved ease with feeding as evidenced by decreasing PPT#2 (self feeding) by 3 secs Baseline:  Goal status:  INITIAL  4.  Pt will demonstrate improved UE functional use as evidenced by increasing box and blocks score by 4 blocks bilaterally. Baseline:  Right 21 blocks, Left 11 blocks Goal status: IN PROGRESS (RT - 19)  5.    Improve PPT#4 to under 1 minute Baseline: 1 min. 22 sec Goal status: 03/14/22 needs cueing for strategies, >50mn    LONG TERM GOALS: Target date: 04/03/22  Pt will verbalize understanding of ways to prevent future  PD related complications and PD community resources. Baseline:  Goal status: INITIAL  2. Pt will demonstrate improved fine motor coordination for ADLs as evidenced by decreasing 9 hole peg test score for RUE by 4 secs Baseline: 9 Hole Peg test: Right: 69.57 sec; Left: 2 mins 20  sec Goal status: INITIAL  3.  Pt will demonstrate improved fine motor coordination for ADLs as evidenced by decreasing 9 hole peg test score for LUE  to 2 mins or less Baseline: 9 Hole Peg test: Right: 69.57 sec; Left: 2 mins 20  sec Goal status: INITIAL  4.Pt will demonstrate understanding of memory compensations and ways to keep thinking skills sharp Baseline:  Goal status: INITIAL   ASSESSMENT:  CLINICAL IMPRESSION:  Pt slowly progressing towards goals. He benefits from repetition and review of big movement strategies due to cognitive deficits  to incr safety. Anticipate that pt will not meet many goals d/t decreased cognition, apraxia, and carryover of recommendations  PERFORMANCE DEFICITS: in functional skills including ADLs, IADLs, coordination, dexterity, strength, flexibility, Fine motor control, Gross motor control, mobility, balance, endurance, decreased knowledge of precautions, decreased knowledge of use of DME, and UE functional use, cognitive skills including memory, problem solving, safety awareness, and thought, and psychosocial skills including coping strategies, environmental adaptation, habits, interpersonal interactions, and routines and behaviors.   IMPAIRMENTS:  are limiting patient from ADLs, IADLs, play, leisure, and social participation.   COMORBIDITIES:  may have co-morbidities  that affects occupational performance. Patient will benefit from skilled OT to address above impairments and improve overall function.  MODIFICATION OR ASSISTANCE TO COMPLETE EVALUATION: No modification of tasks or assist necessary to complete an evaluation.  OT OCCUPATIONAL PROFILE AND HISTORY: Detailed assessment: Review of records and additional review of physical, cognitive, psychosocial history related to current functional performance.  CLINICAL DECISION MAKING: LOW - limited treatment options, no task modification necessary  REHAB POTENTIAL: Good  EVALUATION COMPLEXITY: Low    PLAN:  OT FREQUENCY: 2x/week  OT DURATION: 12 weeks plus eval to account for scheduling  PLANNED INTERVENTIONS: self care/ADL training, therapeutic exercise, therapeutic activity, neuromuscular re-education, manual therapy, passive range of motion, gait training, balance training, functional mobility training, aquatic therapy, splinting, ultrasound, paraffin, fluidotherapy, moist heat, cryotherapy, patient/family education, cognitive remediation/compensation, visual/perceptual remediation/compensation, energy conservation, coping strategies training, DME and/or AE instructions, and Re-evaluation  RECOMMENDED OTHER SERVICES: PT, ST  CONSULTED AND AGREED WITH PLAN OF CARE: Patient and family member/caregiver  PLAN FOR NEXT SESSION: continue checking short term goals,  rollator safety   Hans Eden, OTR/L 03/18/2022, 9:50 AM

## 2022-03-18 NOTE — Therapy (Signed)
OUTPATIENT SPEECH LANGUAGE PATHOLOGY TREATMENT NOTE   Patient Name: Tony [name] MRN: [id number] DOB:[birth year], 77 y.o., male Today's Date: 03/18/2022  PCP: Prince Solian, MD REFERRING PROVIDER: Marcial Pacas, MD    End of Session - 03/18/22 1028     Visit Number 13    Number of Visits 17    Date for SLP Re-Evaluation 03/22/22    Authorization Type [employer]    SLP Start Time 1015    SLP Stop Time  1100    SLP Time Calculation (min) 45 min    Activity Tolerance Patient tolerated treatment well                  Past Medical History:  Diagnosis Date   Anxiety    Boil of buttock    High cholesterol    Imbalance    Low back pain    Parkinsons    Past Surgical History:  Procedure Laterality Date   CYST REMOVAL LEG  2014   Patient Active Problem List   Diagnosis Date Noted   History of falling 11/29/2021   Unsteadiness on feet 11/29/2021   Excessive sleepiness 11/08/2021   Cognitive impairment 12/25/2020   Anxiety 04/25/2020   Gait abnormality 03/30/2020   Parkinson's disease 03/30/2020   Memory loss 03/30/2020   Parkinsonism 03/30/2020   CELLULITIS AND ABSCESS OF FACE 02/01/2008   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/2006   HYPERLIPIDEMIA 06/30/2006   DIVERTICULOSIS, COLON 06/30/2006   SEBORRHEIC KERATOSIS 06/30/2006   BACK PAIN, LUMBAR 06/30/2006    ONSET DATE: 11/29/2021 referral  REFERRING DIAG: R26.81 (ICD-10-CM) - Unsteadiness on feet Z91.81 (ICD-10-CM) - History of falling G20.C (ICD-10-CM) - Parkinsonism, unspecified Parkinsonism type   THERAPY DIAG:  Dysarthria and anarthria  Cognitive communication deficit  Rationale for Evaluation and Treatment Rehabilitation  SUBJECTIVE:   SUBJECTIVE STATEMENT: "We have not been social talking because we have been sick"  PAIN:  Are you having pain? No   OBJECTIVE:   TODAY'S TREATMENT:     03/18/22: Tony [name] enters room with dB in low to mid 60's. He did not complete  virtual practice, we continue to encourage this as they plana to hire a [place] aid to help with therapy exercises, to use online as a guide for the aid and Tony [name].  Targeted volume and intelligibility using Speak Out! Lesson 6. Pt required occasional min verbal cues, modeling, for volume and breath support.   Pt averages the following volume levels:  Sustained AH: 92dB  Counting:75dB  Reading (phrases): 72dB  Cognitive Exercise: 68dB Required usual min to mod verbal cues, modeling for carryover of intent /volume answering simple questions and participating in moderately complex conversation following structured practice. Personally relevant conversation of interest (politics) improves volume to average 68 to 70dB    03/14/22: [name] reports carryover of several strategies previously targeted with success. Are using visual cues to A in increased volume, [place] board, extra time, and shorter phrases to aid in processing. Provided education on strategies pt can employ to increase successful participation and confidence in communication opportunities: ask for time/clarification, tell what you did understand, let partner know when your words didn't come out quite right, facilitate one-on-one conversations when able. SLP highlighted pt employment of strategies throughout therapy session with support of visual A, simple language, short phrases to enhance understanding. Provided feedback to [name] on her use of short phrases and repetition when giving instructions and conversing with Tony [name]. SLP re-educated on online Speak Out! Practice resource. Demonstrated  navigation to website and showed where info could be found in workbook. Encouraged Tony [name] to complete 1-2 virtual practice sessions before next ST visit.   02/13/22: Tony [name] enters room with sub WNL volume at 65dB average.   Targeted volume and intelligibility using Speak Out! Lesson 12. Pt required inital verbal cues, modeling, for volume and breath support, faded to rare  min visual cues for cognitive exercise.   Pt averages the following volume levels:  Sustained AH: 92dB  Counting:88dB  Reading (phrases): 75dB  Cognitive Exercise: 70dB Required occasional verbal cues, modeling for carryover of intent /volume during 15 minute conversation.    02/11/22: Tony [name] required 1 step cues to park walker and sit with safety precautions. He entered room with sub WNL volume.  Targeted volume and intelligibility using Speak Out! Lesson 11. Pt required occasoinal verbal cues, modeling, for volume and breath support.   Pt averages the following volume levels:  Sustained AH: 92dB  Counting:81 dB  Reading (short story): 73dB  Cognitive Exercise: 68dB Required usual min to mod verbal cues, modeling for carryover of intent /volume generating 3 sentence descriptions for common objects and participating in 5 min simple conversation with range of 67 to 72dB. Ongoing review of caregiver supports to compensate for slow processing of verbal information and slow response time in conversations.    12/13/23[name], spouse present for caregiver training. I demonstrated and instructed on using short phrases with extended pauses, giving 1 direction at a time, using [place] board to give written cues and intentionally bringing Tony [name] into a conversation. [name] agreed that she gets frustrated when Tony [name] is slow to respond and this causes disagreements in the home. Educated how her expectation of faster processing and responding exacerbates Tony [name]'s processing difficulties and results in him not being able to respond. Explained and modeled giving Tony [name] extra time to answer a question, and how when he doesn't respond right a way, asking another question or follow up question makes his processing even more difficult. They Id'd that bill paying was resulting in communication breakdown because Tony [name] could not explain to [name] how to pay a bill and the steps to take to access online, auto payments and phone payments.  I provided a log of bills, due dates, method of payment, amount and passwords. They will work with their daughter, Nira Conn who helps with bills, to fill out the chart to support Tony [name]'s communication and processing as he instructs [name] on bill paying. Speak Out completed (modified to 3 reps of warm ups, 5 sentences and 3 cognitive activities due to time, so [name] could hear how loud he is supposed to be. They agree he is not completing Speak Out as loud at home. I demonstrated cueing - 1 cue, with a pause for cognitive activity of naming 3 items in a category. They reports they attempted to go to game night at church, but this was too much for Tony [name]. I encouraged them to have game night at home with no timers, slow pace and short cues for Tony [name] to allow him to participate in game night as he enjoys this. Provided list of cognitive activities for game night . See pt instructions.    PATIENT EDUCATION: Education details: see above Person educated: Patient and Spouse Education method: Customer service manager Education comprehension: verbalized understanding, returned demonstration, and needs further education  HOME EXERCISE PROGRAM: Speak Out  GOALS: Goals reviewed with patient? Yes  SHORT TERM GOALS: Target date: 01/14/2022  Pt will complete clinical swallow evaluation (CSE) to  assess need for objective swallow evaluation  Baseline: 12/24/2021 Goal status: MET  2.  Pt will complete instrumental swallow evaluation, if indicated per CSE to objectively assess swallow function and efficacy Baseline:  Goal status: deferred d/t CSE results  3.  Pt will demonstrate communication strategies to aid in attention, processing, and coherent discourse in 15 minute conversation with occasional min-A Baseline:  Goal status: MET  4.  Pt will meet dB targets for warm up and counting Speak Out exercises with rare min-A over 1 week period Baseline:  Goal status: MET  5.  Pt's care partner will teach back  strategies and compensations to aid in pt's improved safety whilst eating meals at home over 1 week period Baseline:  Goal status: deferred   LONG TERM GOALS: Target date: 3/66/44 - (recert completed 03/47/42)  Pt will report daily HEP adherence for dysphagia and dysarthria with min-A from care partner over 1 week period Baseline:  Goal status: ONGOING  2.  Pt's average volume will exceed 68 dB in answering simple, 10 minute conversation, such as answering personally relevant questions with occasional min-A over 2 sessions Baseline:  Goal status: ONGOING  3.  Pt will demonstrate saliva management techniques with compensations PRN and rare verbal cues over 30 minutes Baseline:  Goal status: ONGOING  4.  Pt's care partner will report carryover of strategies and compensations to aid in improved safety whilst eating meals at home and subjective report of reduced instances of coughing during meal times over 1 week period Baseline:  Goal status: ONGOING  5.  Pt's volume will average greater than 70 dB during Speak Out reading and cognitive exercises with occasional min-A over 2 sessions  Baseline:  Goal status: ONGOING   ASSESSMENT:  CLINICAL IMPRESSION: Patient is a 77 y.o. 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 Pt presents with mild dysarthria, cognitive impairment, and dysphagia. arily by reduced volume and imprecise articulation. Ongoing training in HEP for dysarthria, compensations for intelligibility, attention, processing, and participation in conversations. Family/caregiver training to support communication and cognition given to spouse and 1 daughter, Santiago Glad. I am requesting his other daughter, Nira Conn, attend 1-2 sessions for training on providing supports to help Tony [name] participate in conversations due to slow processing and to cue him for volume in HEP and conversation  while reducing the cognitive load of processing when interrupted to speak louder. He is hitting the targeted dB in HEP (speak out!) in ST sessions with occasional min A, however he is not hitting dB targets at home. [name] continues to report difficulty hearing Tony [name] at home. Continued Skilled ST is indicated to maximize intelligibility, cognition for safety, reduce caregiver burden and independence . Continue LTG's, recert completed today (02/11/22) through 03/22/21.  OBJECTIVE IMPAIRMENTS: Objective impairments include attention, memory, executive functioning, expressive language, dysarthria, and dysphagia. These impairments are limiting patient from managing medications, managing appointments, managing finances, household responsibilities, ADLs/IADLs, effectively communicating at home and in community, and safety when swallowing.Factors affecting potential to achieve goals and functional outcome are ability to learn/carryover information and medical prognosis.. Patient will benefit from skilled SLP services to address above impairments and improve overall function.  REHAB POTENTIAL: Fair (degenerative dx)  PLAN:  SLP FREQUENCY: 2x/week  SLP DURATION: 8 weeks  PLANNED INTERVENTIONS: Aspiration precaution training, Pharyngeal strengthening exercises, Diet toleration management , Language facilitation, Environmental controls, Trials of upgraded texture/liquids, Cueing hierachy, Cognitive reorganization, Internal/external aids, Functional tasks,  SLP instruction and feedback, Compensatory strategies, Patient/family education, and Re-evaluation    Jirah Rider, Annye Rusk, [place] 03/18/2022, 12:01 PM

## 2022-03-19 DIAGNOSIS — G629 Polyneuropathy, unspecified: Secondary | ICD-10-CM | POA: Diagnosis not present

## 2022-03-19 DIAGNOSIS — J309 Allergic rhinitis, unspecified: Secondary | ICD-10-CM | POA: Diagnosis not present

## 2022-03-19 DIAGNOSIS — K5730 Diverticulosis of large intestine without perforation or abscess without bleeding: Secondary | ICD-10-CM | POA: Diagnosis not present

## 2022-03-19 DIAGNOSIS — R296 Repeated falls: Secondary | ICD-10-CM | POA: Diagnosis not present

## 2022-03-19 DIAGNOSIS — L989 Disorder of the skin and subcutaneous tissue, unspecified: Secondary | ICD-10-CM | POA: Diagnosis not present

## 2022-03-19 DIAGNOSIS — M48061 Spinal stenosis, lumbar region without neurogenic claudication: Secondary | ICD-10-CM | POA: Diagnosis not present

## 2022-03-19 DIAGNOSIS — D568 Other thalassemias: Secondary | ICD-10-CM | POA: Diagnosis not present

## 2022-03-19 DIAGNOSIS — G4700 Insomnia, unspecified: Secondary | ICD-10-CM | POA: Diagnosis not present

## 2022-03-19 DIAGNOSIS — E041 Nontoxic single thyroid nodule: Secondary | ICD-10-CM | POA: Diagnosis not present

## 2022-03-19 DIAGNOSIS — E785 Hyperlipidemia, unspecified: Secondary | ICD-10-CM | POA: Diagnosis not present

## 2022-03-19 DIAGNOSIS — G20B2 Parkinson's disease with dyskinesia, with fluctuations: Secondary | ICD-10-CM | POA: Diagnosis not present

## 2022-03-20 ENCOUNTER — Encounter: Payer: PPO | Admitting: Occupational Therapy

## 2022-03-20 ENCOUNTER — Encounter: Payer: PPO | Admitting: Speech Pathology

## 2022-03-20 NOTE — Therapy (Signed)
OUTPATIENT OCCUPATIONAL THERAPY [facility]ATMENT & Progress Note  Patient Name: Tony [name] MRN: [id number] DOB:1945/05/16, 77 y.o., male Today's Date: 03/21/2022  PCP: Dr. Joylene [name] REFERRING PROVIDER: Dr. Terrace [name]   OT End of Session - 03/21/22 1145     Visit Number 10    Number of Visits 25    Date for OT Re-Evaluation 04/03/22    Authorization Type [employer]    Authorization - Visit Number 10    Authorization - Number of Visits 10    Progress Note Due on Visit 10    OT Start Time 1150    OT Stop Time 1230    OT Time Calculation (min) 40 min    Equipment Utilized During Treatment [name]    Activity Tolerance Patient tolerated treatment well    Behavior During Therapy Impulsive                 Past Medical History:  Diagnosis Date   Anxiety    Boil of buttock    High cholesterol    Imbalance    Low back pain    Parkinsons    Past Surgical History:  Procedure Laterality Date   CYST REMOVAL LEG  2014   Patient Active Problem List   Diagnosis Date Noted   History of falling 11/29/2021   Unsteadiness on feet 11/29/2021   Excessive sleepiness 11/08/2021   Cognitive impairment 12/25/2020   Anxiety 04/25/2020   Gait abnormality 03/30/2020   Parkinson's disease 03/30/2020   Memory loss 03/30/2020   Parkinsonism 03/30/2020   CELLULITIS AND ABSCESS OF FACE 02/01/2008   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/2006   HYPERLIPIDEMIA 06/30/2006   DIVERTICULOSIS, COLON 06/30/2006   SEBORRHEIC KERATOSIS 06/30/2006   BACK PAIN, LUMBAR 06/30/2006    ONSET DATE: 11/29/21  REFERRING DIAG:  Diagnosis  R26.81 (ICD-10-CM) - Unsteadiness on feet  Z91.81 (ICD-10-CM) - History of falling  G20.C (ICD-10-CM) - Parkinsonism, unspecified Parkinsonism type    THERAPY DIAG:  Other symptoms and signs involving the nervous system  Frontal lobe and executive function deficit  Abnormal posture  Unsteadiness on feet  Attention  and concentration deficit  Rationale for Evaluation and Treatment: Rehabilitation  SUBJECTIVE:   SUBJECTIVE STATEMENT: "I've been trying to work on the jacket at home"    [name] Pt accompanied by: wife  PERTINENT HISTORY:  per Dr. Terrace [name] [employer] in July 2022 showed bilateral decreased radioactive tracing in the putamen and asymmetric decreased radiotracer activity in the head of the right caudate nucleus His cognitive impairment progress quickly compared to the idiopathic typical Parkinson's patient, he seems to have less optimal response to Sinemet, differentiation diagnosis including idiopathic Parkinson's disease with cognitive impairment versus Lewy body dementia. PMH: anxiety, hyperlipidemia, back pain  Has incr Sinemet to 2 pills 3x/day.   PRECAUTIONS: Fall  WEIGHT BEARING RESTRICTIONS: No  PAIN:  Are you having pain? No  FALLS: Has patient fallen in last 6 months? Yes. Number of falls 5  LIVING ENVIRONMENT: Lives with: lives with their spouse, dtr's assist Lives in: [place] Stairs: No Has following equipment at home:  rollator  PLOF: Independent  PATIENT GOALS: increased ease with dressing and fine motor coordination  OBJECTIVE:   HAND DOMINANCE: Right  ADLs: Overall ADLs: Pt 's dtr's assist Transfers/ambulation related to ADLs: Eating: increased spills needs assist with cutting food Grooming: mod I UB Dressing:  able to donn shirt with setup, difficulty donning/ doffing jacket LB Dressing: min A pants/  socks , uses slip on shoes,  Toileting: mod I with rollator  Bathing: distant supervision Tub Shower transfers:stepping into shower /tub needs supervision, has grab bar   IADLs: Goes to PD cycle on Tues/ Thurs Handwriting: 100% legible  MOBILITY STATUS: Hx of falls, start hesitation, and uses rollator  POSTURE COMMENTS:  rounded shoulders and forward head   FUNCTIONAL OUTCOME MEASURES: Fastening/unfastening 3 buttons: unable Physical performance  test: PPT#2 (simulated eating) 28.65 & PPT#4 (donning/doffing jacket): NT 01/30/22: PPT#4 = 1 min. 22 sec  COORDINATION: 9 Hole Peg test: Right: 69.57 sec; Left: 2 mins 20  sec Box and Blocks:  Right 21 blocks, Left 11blocks Resting tremor LUE UE ROM:   shoulder flexion: RUE 130. LUE 125 elbow flexion/ extension WFLS   UE MMT:   Not tested  SENSATION: Not tested  Vision:not formally tested, TBA in functional context  COGNITION: Overall cognitive status: Impaired  OBSERVATIONS: Bradykinesia   TODAY'S TREATMENT:        Functional reach to each side/with each while standing at the counter with UE support to flip cards for wt. Shift, trunk rotation, and large amplitude reach, grasp/release with mod cueing and set-up for large amplitude movements.  Functional mobility:  practiced sit>stand, stand>sit, turning, and scooting up to table with mod cueing for strategies/large amplitude for incr safety, pt benefits from anticipatory cueing due to cognitive deficits.  Functional mobility:  using rollator to search for and retrieve cones incorporating rollator safey strategies, environmental scanning.  Pt given min-mod cueing for staying close to walker, slow down, and stop close to object with feet apart prior to reach.    PATIENT EDUCATION: Education details:  reviewed/emphasized strategies for incr safety with functional mobility and recommended/instructed pt/wife in using anticipatory cueing Person educated: Patient and Spouse Education method: Explanation, Demonstration, and Verbal cues Education comprehension: verbalized understanding, returned demonstration, verbal cues required, and needs further education   HOME EXERCISE PROGRAM: 02/11/22: bag exercises, strategies for donning/doffing jacket, donning/doffing pants 02/13/22: Coordination HEP   GOALS:  Goals reviewed with patient? Yes  SHORT TERM GOALS: Target date:03/04/22- extended due to scheduling  I with PD specific  HEP Baseline: Goal status: MET  2.  Pt will verbalize understanding of adapted strategies to maximize safety and I with ADLs/ [place] to minimize fall risk . Baseline:  Goal status: MET, however d/t cognitive deficits will always need reminders/cues from family/caregivers  3.  Pt will demonstrate improved ease with feeding as evidenced by decreasing PPT#2 (self feeding) by 3 secs Baseline:  Goal status: MET  03/21/22  23sec  4.  Pt will demonstrate improved UE functional use as evidenced by increasing box and blocks score by 4 blocks bilaterally. Baseline:  Right 21 blocks, Left 11 blocks Goal status: IN PROGRESS (RT - 19)  5.    Improve PPT#4 to under 1 minute Baseline: 1 min. 22 sec Goal status: 03/14/22 needs cueing for strategies, >55min    LONG TERM GOALS: Target date: 04/03/22  Pt will verbalize understanding of ways to prevent future  PD related complications and PD community resources. Baseline:  Goal status: INITIAL  2. Pt will demonstrate improved fine motor coordination for ADLs as evidenced by decreasing 9 hole peg test score for RUE by 4 secs Baseline: 9 Hole Peg test: Right: 69.57 sec; Left: 2 mins 20  sec Goal status: INITIAL  3.  Pt will demonstrate improved fine motor coordination for ADLs as evidenced by decreasing 9 hole peg test score for LUE  to  2 mins or less Baseline: 9 Hole Peg test: Right: 69.57 sec; Left: 2 mins 20  sec Goal status: INITIAL  4.Pt will demonstrate understanding of memory compensations and ways to keep thinking skills sharp Baseline:  Goal status: INITIAL   ASSESSMENT:  CLINICAL IMPRESSION: Progress Note Reporting Period 01/09/22-03/21/22:  Pt slowly progressing towards goals. He benefits from repetition, but will continue to need cueing for safety with functional mobility and ADLs.  Pt would benefit from continued occupational therapy for further education, reinforcement of strategies to incr safety and ease of ADLs and to improve LUE  functional use/coordination.    PERFORMANCE DEFICITS: in functional skills including ADLs, IADLs, coordination, dexterity, strength, flexibility, Fine motor control, Gross motor control, mobility, balance, endurance, decreased knowledge of precautions, decreased knowledge of use of DME, and UE functional use, cognitive skills including memory, problem solving, safety awareness, and thought, and psychosocial skills including coping strategies, environmental adaptation, habits, interpersonal interactions, and routines and behaviors.   IMPAIRMENTS: are limiting patient from ADLs, IADLs, play, leisure, and social participation.   COMORBIDITIES:  may have co-morbidities  that affects occupational performance. Patient will benefit from skilled OT to address above impairments and improve overall function.  MODIFICATION OR ASSISTANCE TO COMPLETE EVALUATION: No modification of tasks or assist necessary to complete an evaluation.  OT OCCUPATIONAL PROFILE AND HISTORY: Detailed assessment: Review of records and additional review of physical, cognitive, psychosocial history related to current functional performance.  CLINICAL DECISION MAKING: LOW - limited treatment options, no task modification necessary  REHAB POTENTIAL: Good  EVALUATION COMPLEXITY: Low    PLAN:  OT FREQUENCY: 2x/week  OT DURATION: 12 weeks plus eval to account for scheduling  PLANNED INTERVENTIONS: self care/ADL training, therapeutic exercise, therapeutic activity, neuromuscular re-education, manual therapy, passive range of motion, gait training, balance training, functional mobility training, aquatic therapy, splinting, ultrasound, paraffin, fluidotherapy, moist heat, cryotherapy, patient/family education, cognitive remediation/compensation, visual/perceptual remediation/compensation, energy conservation, coping strategies training, DME and/or AE instructions, and Re-evaluation  RECOMMENDED OTHER SERVICES: PT, ST  CONSULTED AND  AGREED WITH PLAN OF CARE: Patient and family member/caregiver  PLAN FOR NEXT SESSION: continue to practice rollator safety/functional mobility, ADL strategies, coordination   Brunilda Eble, OTR/L 03/21/2022, 11:49 AM

## 2022-03-21 ENCOUNTER — Encounter: Payer: PPO | Admitting: Speech Pathology

## 2022-03-21 ENCOUNTER — Ambulatory Visit: Payer: PPO | Admitting: Occupational Therapy

## 2022-03-21 ENCOUNTER — Encounter: Payer: Self-pay | Admitting: Occupational Therapy

## 2022-03-21 DIAGNOSIS — R293 Abnormal posture: Secondary | ICD-10-CM

## 2022-03-21 DIAGNOSIS — R2681 Unsteadiness on feet: Secondary | ICD-10-CM

## 2022-03-21 DIAGNOSIS — R29818 Other symptoms and signs involving the nervous system: Secondary | ICD-10-CM | POA: Diagnosis not present

## 2022-03-21 DIAGNOSIS — R41840 Attention and concentration deficit: Secondary | ICD-10-CM

## 2022-03-21 DIAGNOSIS — R41844 Frontal lobe and executive function deficit: Secondary | ICD-10-CM

## 2022-03-25 ENCOUNTER — Ambulatory Visit: Payer: PPO | Admitting: Occupational Therapy

## 2022-03-25 ENCOUNTER — Encounter: Payer: Self-pay | Admitting: Occupational Therapy

## 2022-03-25 ENCOUNTER — Ambulatory Visit: Payer: PPO | Admitting: Speech Pathology

## 2022-03-25 DIAGNOSIS — R41844 Frontal lobe and executive function deficit: Secondary | ICD-10-CM

## 2022-03-25 DIAGNOSIS — R41840 Attention and concentration deficit: Secondary | ICD-10-CM

## 2022-03-25 DIAGNOSIS — R29818 Other symptoms and signs involving the nervous system: Secondary | ICD-10-CM | POA: Diagnosis not present

## 2022-03-25 DIAGNOSIS — R293 Abnormal posture: Secondary | ICD-10-CM

## 2022-03-25 DIAGNOSIS — R471 Dysarthria and anarthria: Secondary | ICD-10-CM

## 2022-03-25 DIAGNOSIS — R41841 Cognitive communication deficit: Secondary | ICD-10-CM

## 2022-03-25 DIAGNOSIS — R278 Other lack of coordination: Secondary | ICD-10-CM

## 2022-03-25 NOTE — Therapy (Signed)
OUTPATIENT OCCUPATIONAL THERAPY [facility]REATMENT   Patient Name: Tony [name] MRN: [id number] DOB:[birth year], 77 y.o., male Today's Date: 03/25/2022  PCP: Dr. Steva Ready REFERRING PROVIDER: Dr. Krista Blue   OT End of Session - 03/25/22 0940     Visit Number 11    Number of Visits 25    Date for OT Re-Evaluation 04/03/22    Authorization Type [employer]    Authorization - Visit Number 11    Authorization - Number of Visits 20    Progress Note Due on Visit 20    OT Start Time [phone]    OT Stop Time 1015    OT Time Calculation (min) 38 min    Equipment Utilized During Treatment Goes by [name]    Activity Tolerance Patient tolerated treatment well    Behavior During Therapy Impulsive                 Past Medical History:  Diagnosis Date   Anxiety    Boil of buttock    High cholesterol    Imbalance    Low back pain    Parkinsons    Past Surgical History:  Procedure Laterality Date   CYST REMOVAL LEG  2014   Patient Active Problem List   Diagnosis Date Noted   History of falling 11/29/2021   Unsteadiness on feet 11/29/2021   Excessive sleepiness 11/08/2021   Cognitive impairment 12/25/2020   Anxiety 04/25/2020   Gait abnormality 03/30/2020   Parkinson's disease 03/30/2020   Memory loss 03/30/2020   Parkinsonism 03/30/2020   CELLULITIS AND ABSCESS OF FACE 02/01/2008   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/2006   HYPERLIPIDEMIA 06/30/2006   DIVERTICULOSIS, COLON 06/30/2006   SEBORRHEIC KERATOSIS 06/30/2006   BACK PAIN, LUMBAR 06/30/2006    ONSET DATE: 11/29/21  REFERRING DIAG:  Diagnosis  R26.81 (ICD-10-CM) - Unsteadiness on feet  Z91.81 (ICD-10-CM) - History of falling  G20.C (ICD-10-CM) - Parkinsonism, unspecified Parkinsonism type    THERAPY DIAG:  Other symptoms and signs involving the nervous system  Frontal lobe and executive function deficit  Attention and concentration deficit  Abnormal posture  Other lack  of coordination  Rationale for Evaluation and Treatment: Rehabilitation  SUBJECTIVE:   SUBJECTIVE STATEMENT: I feel like I'm thinking a little clearer since they upped my medication. No recent falls. I had to cancel this Wednesday's appts because I'm meeting with the [facility] by [name] Pt accompanied by: wife  PERTINENT HISTORY:  per Dr. Krista Blue [employer] in July 2022 showed bilateral decreased radioactive tracing in the putamen and asymmetric decreased radiotracer activity in the head of the right caudate nucleus His cognitive impairment progress quickly compared to the idiopathic typical Parkinson's patient, he seems to have less optimal response to Sinemet, differentiation diagnosis including idiopathic Parkinson's disease with cognitive impairment versus Lewy body dementia. PMH: anxiety, hyperlipidemia, back pain  Has incr Sinemet to 2 pills 3x/day.   PRECAUTIONS: Fall  WEIGHT BEARING RESTRICTIONS: No  PAIN:  Are you having pain? No  FALLS: Has patient fallen in last 6 months? Yes. Number of falls 5  LIVING ENVIRONMENT: Lives with: lives with their spouse, dtr's assist Lives in: [place] Stairs: No Has following equipment at home:  rollator  PLOF: Independent  PATIENT GOALS: increased ease with dressing and fine motor coordination  OBJECTIVE:   HAND DOMINANCE: Right  ADLs: Overall ADLs: Pt 's dtr's assist Transfers/ambulation related to ADLs: Eating: increased spills needs assist with cutting food Grooming: mod I UB Dressing:  able to donn shirt with setup, difficulty donning/ doffing jacket LB Dressing: min A pants/ socks , uses slip on shoes,  Toileting: mod I with rollator  Bathing: distant supervision Tub Shower transfers:stepping into shower /tub needs supervision, has grab bar   IADLs: Goes to PD cycle on Tues/ Thurs Handwriting: 100% legible  MOBILITY STATUS: Hx of falls, start hesitation, and uses rollator  POSTURE COMMENTS:  rounded shoulders and forward  head   FUNCTIONAL OUTCOME MEASURES: Fastening/unfastening 3 buttons: unable Physical performance test: PPT#2 (simulated eating) 28.65 & PPT#4 (donning/doffing jacket): NT 01/30/22: PPT#4 = 1 min. 22 sec  COORDINATION: 9 Hole Peg test: Right: 69.57 sec; Left: 2 mins 20  sec Box and Blocks:  Right 21 blocks, Left 11blocks Resting tremor LUE UE ROM:   shoulder flexion: RUE 130. LUE 125 elbow flexion/ extension WFLS   UE MMT:   Not tested  SENSATION: Not tested  Vision:not formally tested, TBA in functional context  COGNITION: Overall cognitive status: Impaired  OBSERVATIONS: Bradykinesia   TODAY'S TREATMENT:        Functional reach to each side/with each UE while standing at table with wt. Shift, trunk rotation, and large amplitude reach, grasp/release with mod cueing and set-up for large amplitude movements to place clothespins on antenna and then remove: LUE w/ cues to open hand big around both sides of clothespins and for correct placement; then RUE w/ less cueing required.  Functional mobility:  practiced sit>stand, stand>sit, turning, and scooting up to table with mod cueing for strategies/large amplitude for incr safety, pt benefits from anticipatory cueing due to cognitive deficits.  Seated: flipping cards w/ Lt hand w/ cues for large amplitude movements to open hands big   PATIENT EDUCATION: Education details:  reviewed/emphasized strategies for incr safety with functional mobility and recommended/instructed pt/wife in using anticipatory cueing Person educated: Patient and Spouse Education method: Explanation, Demonstration, and Verbal cues Education comprehension: verbalized understanding, returned demonstration, verbal cues required, and needs further education   HOME EXERCISE PROGRAM: 02/11/22: bag exercises, strategies for donning/doffing jacket, donning/doffing pants 02/13/22: Coordination HEP   GOALS:  Goals reviewed with patient? Yes  SHORT TERM GOALS:  Target date:03/04/22- extended due to scheduling  I with PD specific HEP Baseline: Goal status: MET  2.  Pt will verbalize understanding of adapted strategies to maximize safety and I with ADLs/ [place] to minimize fall risk . Baseline:  Goal status: MET, however d/t cognitive deficits will always need reminders/cues from family/caregivers  3.  Pt will demonstrate improved ease with feeding as evidenced by decreasing PPT#2 (self feeding) by 3 secs Baseline:  Goal status: MET  03/21/22  23sec  4.  Pt will demonstrate improved UE functional use as evidenced by increasing box and blocks score by 4 blocks bilaterally. Baseline:  Right 21 blocks, Left 11 blocks Goal status: IN PROGRESS (RT - 19)  5.    Improve PPT#4 to under 1 minute Baseline: 1 min. 22 sec Goal status: 03/14/22 needs cueing for strategies, >81mn    LONG TERM GOALS: Target date: 04/03/22  Pt will verbalize understanding of ways to prevent future  PD related complications and PD community resources. Baseline:  Goal status: INITIAL  2. Pt will demonstrate improved fine motor coordination for ADLs as evidenced by decreasing 9 hole peg test score for RUE by 4 secs Baseline: 9 Hole Peg test: Right: 69.57 sec; Left: 2 mins 20  sec Goal status: INITIAL  3.  Pt will demonstrate improved fine motor coordination for  ADLs as evidenced by decreasing 9 hole peg test score for LUE  to 2 mins or less Baseline: 9 Hole Peg test: Right: 69.57 sec; Left: 2 mins 20  sec Goal status: INITIAL  4.Pt will demonstrate understanding of memory compensations and ways to keep thinking skills sharp Baseline:  Goal status: INITIAL   ASSESSMENT:  CLINICAL IMPRESSION: Progress Note Reporting Period 01/09/22-03/21/22:  Pt slowly progressing towards goals. He benefits from repetition, but will continue to need cueing for safety with functional mobility and ADLs.  Pt would benefit from continued occupational therapy for further education,  reinforcement of strategies to incr safety and ease of ADLs and to improve LUE functional use/coordination.    PERFORMANCE DEFICITS: in functional skills including ADLs, IADLs, coordination, dexterity, strength, flexibility, Fine motor control, Gross motor control, mobility, balance, endurance, decreased knowledge of precautions, decreased knowledge of use of DME, and UE functional use, cognitive skills including memory, problem solving, safety awareness, and thought, and psychosocial skills including coping strategies, environmental adaptation, habits, interpersonal interactions, and routines and behaviors.   IMPAIRMENTS: are limiting patient from ADLs, IADLs, play, leisure, and social participation.   COMORBIDITIES:  may have co-morbidities  that affects occupational performance. Patient will benefit from skilled OT to address above impairments and improve overall function.  MODIFICATION OR ASSISTANCE TO COMPLETE EVALUATION: No modification of tasks or assist necessary to complete an evaluation.  OT OCCUPATIONAL PROFILE AND HISTORY: Detailed assessment: Review of records and additional review of physical, cognitive, psychosocial history related to current functional performance.  CLINICAL DECISION MAKING: LOW - limited treatment options, no task modification necessary  REHAB POTENTIAL: Good  EVALUATION COMPLEXITY: Low    PLAN:  OT FREQUENCY: 2x/week  OT DURATION: 12 weeks plus eval to account for scheduling  PLANNED INTERVENTIONS: self care/ADL training, therapeutic exercise, therapeutic activity, neuromuscular re-education, manual therapy, passive range of motion, gait training, balance training, functional mobility training, aquatic therapy, splinting, ultrasound, paraffin, fluidotherapy, moist heat, cryotherapy, patient/family education, cognitive remediation/compensation, visual/perceptual remediation/compensation, energy conservation, coping strategies training, DME and/or AE  instructions, and Re-evaluation  RECOMMENDED OTHER SERVICES: PT, ST  CONSULTED AND AGREED WITH PLAN OF CARE: Patient and family member/caregiver  PLAN FOR NEXT SESSION: assess remaining STG's   Hans Eden, OTR/L 03/25/2022, 9:42 AM

## 2022-03-25 NOTE — Therapy (Signed)
OUTPATIENT SPEECH LANGUAGE PATHOLOGY TREATMENT NOTE   Patient Name: Tony [name] MRN: [id number] DOB:1945-06-11, 77 y.o., male Today's Date: 03/25/2022  PCP: Tony Solian, MD REFERRING PROVIDER: Marcial Pacas, MD    End of Session - 03/25/22 1112     Visit Number 14    Number of Visits 17    Date for SLP Re-Evaluation 03/22/22    Authorization Type [employer]    SLP Start Time 1030    SLP Stop Time  1113    SLP Time Calculation (min) 43 min    Activity Tolerance Patient tolerated treatment well                   Past Medical History:  Diagnosis Date   Anxiety    Boil of buttock    High cholesterol    Imbalance    Low back pain    Parkinsons    Past Surgical History:  Procedure Laterality Date   CYST REMOVAL LEG  2014   Patient Active Problem List   Diagnosis Date Noted   History of falling 11/29/2021   Unsteadiness on feet 11/29/2021   Excessive sleepiness 11/08/2021   Cognitive impairment 12/25/2020   Anxiety 04/25/2020   Gait abnormality 03/30/2020   Parkinson's disease 03/30/2020   Memory loss 03/30/2020   Parkinsonism 03/30/2020   CELLULITIS AND ABSCESS OF FACE 02/01/2008   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/2006   HYPERLIPIDEMIA 06/30/2006   DIVERTICULOSIS, COLON 06/30/2006   SEBORRHEIC KERATOSIS 06/30/2006   BACK PAIN, LUMBAR 06/30/2006    ONSET DATE: 11/29/2021 referral  REFERRING DIAG: R26.81 (ICD-10-CM) - Unsteadiness on feet Z91.81 (ICD-10-CM) - History of falling G20.C (ICD-10-CM) - Parkinsonism, unspecified Parkinsonism type   THERAPY DIAG:  Dysarthria and anarthria  Cognitive communication deficit  Rationale for Evaluation and Treatment Rehabilitation  SUBJECTIVE:   SUBJECTIVE STATEMENT: "I was working on my left arm in OT." "I think I'm on lesson 12."  PAIN:  Are you having pain? No   OBJECTIVE:   TODAY'S TREATMENT:     03/25/22: Tony [name] entered room with average volume around 68 dB. SLP  provided re-education concerning importance of self advocacy and continuing home exercises. Maintained conversational volume averaging 68 dB during 10 minute conversation. Rare min to supervision cues.   Pt averages the following volume levels:  Sustained AH: 87 dB  Counting: 76 dB  Reading (phrases): 70 dB  Cognitive Exercise: 68-72 dB   03/18/22: Tony [name] enters room with dB in low to mid 60's. He did not complete virtual practice, we continue to encourage this as they plana to hire a Tony [name] aid to help with therapy exercises, to use online as a guide for the aid and Tony [name].  Targeted volume and intelligibility using Speak Out! Lesson 6. Pt required occasional min verbal cues, modeling, for volume and breath support.   Pt averages the following volume levels:  Sustained AH: 92dB  Counting:75dB  Reading (phrases): 72dB  Cognitive Exercise: 68dB Required usual min to mod verbal cues, modeling for carryover of intent /volume answering simple questions and participating in moderately complex conversation following structured practice. Personally relevant conversation of interest (politics) improves volume to average 68 to 70dB    03/14/22: Tony [name] reports carryover of several strategies previously targeted with success. Are using visual cues to A in increased volume, Tony [name] board, extra time, and shorter phrases to aid in processing. Provided education on strategies pt can employ to increase successful participation and confidence in communication opportunities: ask for  time/clarification, tell what you did understand, let partner know when your words didn't come out quite right, facilitate one-on-one conversations when able. SLP highlighted pt employment of strategies throughout therapy session with support of visual A, simple language, short phrases to enhance understanding. Provided feedback to Tony [name] on her use of short phrases and repetition when giving instructions and conversing with Tony [name]. SLP re-educated on  online Speak Out! Practice resource. Demonstrated navigation to website and showed where info could be found in workbook. Encouraged Tony [name] to complete 1-2 virtual practice sessions before next ST visit.   02/13/22: Tony [name] enters room with sub WNL volume at 65dB average.   Targeted volume and intelligibility using Speak Out! Lesson 12. Pt required inital verbal cues, modeling, for volume and breath support, faded to rare min visual cues for cognitive exercise.   Pt averages the following volume levels:  Sustained AH: 92dB  Counting:88dB  Reading (phrases): 75dB  Cognitive Exercise: 70dB Required occasional verbal cues, modeling for carryover of intent /volume during 15 minute conversation.    02/11/22: Tony [name] required 1 step cues to park walker and sit with safety precautions. He entered room with sub WNL volume.  Targeted volume and intelligibility using Speak Out! Lesson 11. Pt required occasoinal verbal cues, modeling, for volume and breath support.   Pt averages the following volume levels:  Sustained AH: 92dB  Counting:81 dB  Reading (short story): 73dB  Cognitive Exercise: 68dB Required usual min to mod verbal cues, modeling for carryover of intent /volume generating 3 sentence descriptions for common objects and participating in 5 min simple conversation with range of 67 to 72dB. Ongoing review of caregiver supports to compensate for slow processing of verbal information and slow response time in conversations.   PATIENT EDUCATION: Education details: see above Person educated: Patient and Spouse Education method: Customer service manager Education comprehension: verbalized understanding, returned demonstration, and needs further education  HOME EXERCISE PROGRAM: Speak Out  SPEECH THERAPY DISCHARGE SUMMARY  Visits from Start of Care: 14  Current functional level related to goals / functional outcomes: See goals below   Remaining deficits: Hypokinetic dysarthria, Lewy Body  dementia   Education / Equipment: Dysarthria compensations, HEP    Patient agrees to discharge. Patient goals were partially met. Patient is being discharged due to meeting the stated rehab goals.Marland Kitchen     GOALS: Goals reviewed with patient? Yes  SHORT TERM GOALS: Target date: 01/14/2022  Pt will complete clinical swallow evaluation (CSE) to assess need for objective swallow evaluation  Baseline: 12/24/2021 Goal status: MET  2.  Pt will complete instrumental swallow evaluation, if indicated per CSE to objectively assess swallow function and efficacy Baseline:  Goal status: deferred d/t CSE results  3.  Pt will demonstrate communication strategies to aid in attention, processing, and coherent discourse in 15 minute conversation with occasional min-A Baseline:  Goal status: MET  4.  Pt will meet dB targets for warm up and counting Speak Out exercises with rare min-A over 1 week period Baseline:  Goal status: MET  5.  Pt's care partner will teach back strategies and compensations to aid in pt's improved safety whilst eating meals at home over 1 week period Baseline:  Goal status: deferred   LONG TERM GOALS: Target date: 03/02/24 - (recert completed 94/85/46)  Pt will report daily HEP adherence for dysphagia and dysarthria with min-A from care partner over 1 week period Baseline:  Goal status: MET  2.  Pt's average volume will exceed 68 dB in answering  simple, 10 minute conversation, such as answering personally relevant questions with occasional min-A over 2 sessions Baseline:  Goal status: MET  3.  Pt will demonstrate saliva management techniques with compensations PRN and rare verbal cues over 30 minutes Baseline:  Goal status: PARTIALLY MET  4.  Pt's care partner will report carryover of strategies and compensations to aid in improved safety whilst eating meals at home and subjective report of reduced instances of coughing during meal times over 1 week period Baseline:   Goal status: PARTIALLY MET  5.  Pt's volume will average greater than 70 dB during Speak Out reading and cognitive exercises with occasional min-A over 2 sessions  Baseline:  Goal status: MET   ASSESSMENT:  CLINICAL IMPRESSION: Patient is a 77 y.o. 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 Goals met. Pt with improved volume and carryover with A from family members.   OBJECTIVE IMPAIRMENTS: Objective impairments include attention, memory, executive functioning, expressive language, dysarthria, and dysphagia. These impairments are limiting patient from managing medications, managing appointments, managing finances, household responsibilities, ADLs/IADLs, effectively communicating at home and in community, and safety when swallowing.Factors affecting potential to achieve goals and functional outcome are ability to learn/carryover information and medical prognosis.. Patient will benefit from skilled SLP services to address above impairments and improve overall function.  REHAB POTENTIAL: Fair (degenerative dx)  PLAN:  SLP FREQUENCY: 2x/week  SLP DURATION: 8 weeks  PLANNED INTERVENTIONS: Aspiration precaution training, Pharyngeal strengthening exercises, Diet toleration management , Language facilitation, Environmental controls, Trials of upgraded texture/liquids, Cueing hierachy, Cognitive reorganization, Internal/external aids, Functional tasks, SLP instruction and feedback, Compensatory strategies, Patient/family education, and Re-evaluation    Leroy Libman, Student-SLP 03/25/2022, 11:14 AM

## 2022-03-25 NOTE — Patient Instructions (Addendum)
Re-evaluations have been changed for 6 months out. Tony [name] has one more OT appointment scheduled.   OT: Tues 2/6 10:15 am  Therapy evals: Wed July 17 9:30 am  Continue doing your workbook 5/7 days a week! Practice is important!

## 2022-03-27 ENCOUNTER — Ambulatory Visit: Payer: PPO | Admitting: Occupational Therapy

## 2022-03-27 ENCOUNTER — Ambulatory Visit: Payer: PPO | Admitting: Speech Pathology

## 2022-04-02 ENCOUNTER — Ambulatory Visit: Payer: PPO | Attending: Neurology | Admitting: Occupational Therapy

## 2022-04-02 DIAGNOSIS — R278 Other lack of coordination: Secondary | ICD-10-CM

## 2022-04-02 DIAGNOSIS — R41840 Attention and concentration deficit: Secondary | ICD-10-CM | POA: Diagnosis not present

## 2022-04-02 DIAGNOSIS — R2681 Unsteadiness on feet: Secondary | ICD-10-CM

## 2022-04-02 DIAGNOSIS — M6281 Muscle weakness (generalized): Secondary | ICD-10-CM

## 2022-04-02 DIAGNOSIS — R29818 Other symptoms and signs involving the nervous system: Secondary | ICD-10-CM | POA: Diagnosis not present

## 2022-04-02 DIAGNOSIS — R293 Abnormal posture: Secondary | ICD-10-CM | POA: Diagnosis not present

## 2022-04-02 DIAGNOSIS — R41844 Frontal lobe and executive function deficit: Secondary | ICD-10-CM | POA: Diagnosis not present

## 2022-04-02 DIAGNOSIS — R2689 Other abnormalities of gait and mobility: Secondary | ICD-10-CM | POA: Diagnosis not present

## 2022-04-02 NOTE — Therapy (Signed)
OUTPATIENT OCCUPATIONAL THERAPY PARKINSON'S TREATMENT   Patient Name: Tony [name] MRN: [id number] DOB:01/23/1946, 77 y.o., male Today's Date: 04/02/2022  PCP: Dr. Steva Ready REFERRING PROVIDER: Dr. Krista Blue OCCUPATIONAL THERAPY DISCHARGE SUMMARY   Current functional level related to goals / functional outcomes: Pt made progress towards goals however he did not fully achieve all goals due to severity of deficits.   Remaining deficits: Decreased balance, decreased functional mobility, decreased coordination, cognitive deficits, decreased LUE functional use   Education / Equipment: Pt/ family were educated in: ADL strategies, HEP, community resources, preventing future PD complications, memory strategies, and keeping thinking skills sharp. Pt/ wife verbalize understanding of education.   Patient agrees to discharge. Patient goals were partially met. Patient is being discharged due to maximized rehab potential. .      OT End of Session - 04/02/22 1012     Visit Number 12    Number of Visits 25    Date for OT Re-Evaluation 04/03/22    Authorization Type [employer]    Authorization - Visit Number 12    Progress Note Due on Visit 20    OT Start Time 1015    OT Stop Time 1055    OT Time Calculation (min) 40 min    Activity Tolerance Patient tolerated treatment well                 Past Medical History:  Diagnosis Date   Anxiety    Boil of buttock    High cholesterol    Imbalance    Low back pain    Parkinsons    Past Surgical History:  Procedure Laterality Date   CYST REMOVAL LEG  2014   Patient Active Problem List   Diagnosis Date Noted   History of falling 11/29/2021   Unsteadiness on feet 11/29/2021   Excessive sleepiness 11/08/2021   Cognitive impairment 12/25/2020   Anxiety 04/25/2020   Gait abnormality 03/30/2020   Parkinson's disease 03/30/2020   Memory loss 03/30/2020   Parkinsonism 03/30/2020   CELLULITIS AND ABSCESS OF FACE  02/01/2008   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/2006   HYPERLIPIDEMIA 06/30/2006   DIVERTICULOSIS, COLON 06/30/2006   SEBORRHEIC KERATOSIS 06/30/2006   BACK PAIN, LUMBAR 06/30/2006    ONSET DATE: 11/29/21  REFERRING DIAG:  Diagnosis  R26.81 (ICD-10-CM) - Unsteadiness on feet  Z91.81 (ICD-10-CM) - History of falling  G20.C (ICD-10-CM) - Parkinsonism, unspecified Parkinsonism type    THERAPY DIAG:  Other symptoms and signs involving the nervous system  Frontal lobe and executive function deficit  Attention and concentration deficit  Abnormal posture  Other lack of coordination  Unsteadiness on feet  Other abnormalities of gait and mobility  Muscle weakness (generalized)  Rationale for Evaluation and Treatment: Rehabilitation  SUBJECTIVE:   SUBJECTIVE STATEMENT: Pt reports that they are getting [facility] assist   Goes by [name] Pt accompanied by: wife  PERTINENT HISTORY:  per Dr. Krista Blue [facility] in July 2022 showed bilateral decreased radioactive tracing in the putamen and asymmetric decreased radiotracer activity in the head of the right caudate nucleus His cognitive impairment progress quickly compared to the idiopathic typical Parkinson's patient, he seems to have less optimal response to Sinemet, differentiation diagnosis including idiopathic Parkinson's disease with cognitive impairment versus Lewy body dementia. PMH: anxiety, hyperlipidemia, back pain  Has incr Sinemet to 2 pills 3x/day.   PRECAUTIONS: Fall  WEIGHT BEARING RESTRICTIONS: No  PAIN:  Are you having pain? No  FALLS: Has patient fallen in last 6  months? Yes. Number of falls 5  LIVING ENVIRONMENT: Lives with: lives with their spouse, dtr's assist Lives in: [place] Stairs: No Has following equipment at home:  rollator  PLOF: Independent  PATIENT GOALS: increased ease with dressing and fine motor coordination  OBJECTIVE:   HAND DOMINANCE: Right  ADLs: Overall ADLs: Pt 's dtr's  assist Transfers/ambulation related to ADLs: Eating: increased spills needs assist with cutting food Grooming: mod I UB Dressing:  able to donn shirt with setup, difficulty donning/ doffing jacket LB Dressing: min A pants/ socks , uses slip on shoes,  Toileting: mod I with rollator  Bathing: distant supervision Tub Shower transfers:stepping into shower /tub needs supervision, has grab bar   IADLs: Goes to PD cycle on Tues/ Thurs Handwriting: 100% legible  MOBILITY STATUS: Hx of falls, start hesitation, and uses rollator  POSTURE COMMENTS:  rounded shoulders and forward head   FUNCTIONAL OUTCOME MEASURES: Fastening/unfastening 3 buttons: unable Physical performance test: PPT#2 (simulated eating) 28.65 & PPT#4 (donning/doffing jacket): NT 01/30/22: PPT#4 = 1 min. 22 sec  COORDINATION: 9 Hole Peg test: Right: 69.57 sec; Left: 2 mins 20  sec Box and Blocks:  Right 21 blocks, Left 11blocks Resting tremor LUE UE ROM:   shoulder flexion: RUE 130. LUE 125 elbow flexion/ extension WFLS   UE MMT:   Not tested  SENSATION: Not tested  Vision:not formally tested, TBA in functional context  COGNITION: Overall cognitive status: Impaired  OBSERVATIONS: Bradykinesia   TODAY'S TREATMENT:       Therapist checked progress towards goals and reviewed with patient and wife . See education provided below PATIENT EDUCATION: Education details:  education issued and reviewed regarding: keeping thinking skills sharp, memory strategies, preventing future complications, and community resources, therapist recommends that pt continues his bag and coordination exercises with emphasis on increasing use of LUE. Pt/ wife were cautioned against boxing and using weights due to risk for falls and injury. Person educated: Patient and Spouse Education method: Explanation, Demonstration, and Verbal cues, handouts Education comprehension: verbalized understanding, returned demonstration, verbal cues  required,  HOME EXERCISE PROGRAM: 02/11/22: bag exercises, strategies for donning/doffing jacket, donning/doffing pants 02/13/22: Coordination HEP   GOALS:  Goals reviewed with patient? Yes  SHORT TERM GOALS: Target date:03/04/22- extended due to scheduling  I with PD specific HEP Baseline: Goal status: MET  2.  Pt will verbalize understanding of adapted strategies to maximize safety and I with ADLs/ [place] to minimize fall risk . Baseline:  Goal status: MET, however d/t cognitive deficits will always need reminders/cues from family/caregivers  3.  Pt will demonstrate improved ease with feeding as evidenced by decreasing PPT#2 (self feeding) by 3 secs Baseline:  Goal status: MET  03/21/22  23sec    5.    Improve PPT#4 to under 1 minute Baseline: 1 min. 22 sec Goal status: not met-03/14/22 needs cueing for strategies, >42mn    LONG TERM GOALS: Target date: 04/03/22  Pt will verbalize understanding of ways to prevent future  PD related complications and PD community resources. Baseline:  Goal status: met  2. Pt will demonstrate improved fine motor coordination for ADLs as evidenced by decreasing 9 hole peg test score for RUE by 4 secs Baseline: 9 Hole Peg test: Right: 69.57 sec; Left: 2 mins 20  sec Goal status: not met-68.97  3.  Pt will demonstrate improved fine motor coordination for ADLs as evidenced by decreasing 9 hole peg test score for LUE  to 2 mins or less Baseline: [id number]  Peg test: Right: 69.57 sec; Left: 2 mins 20  sec Goal status: not met 4 pegs placed in 2 mins  4.Pt will demonstrate understanding of memory compensations and ways to keep thinking skills sharp Baseline:  Goal status: met   ASSESSMENT:  CLINICAL IMPRESSION: Pt made progress, however pt did not meet all goals due to severity of deficits.  PERFORMANCE DEFICITS: in functional skills including ADLs, IADLs, coordination, dexterity, strength, flexibility, Fine motor control, Gross motor  control, mobility, balance, endurance, decreased knowledge of precautions, decreased knowledge of use of DME, and UE functional use, cognitive skills including memory, problem solving, safety awareness, and thought, and psychosocial skills including coping strategies, environmental adaptation, habits, interpersonal interactions, and routines and behaviors.   IMPAIRMENTS: are limiting patient from ADLs, IADLs, play, leisure, and social participation.   COMORBIDITIES:  may have co-morbidities  that affects occupational performance. Patient will benefit from skilled OT to address above impairments and improve overall function.  MODIFICATION OR ASSISTANCE TO COMPLETE EVALUATION: No modification of tasks or assist necessary to complete an evaluation.  OT OCCUPATIONAL PROFILE AND HISTORY: Detailed assessment: Review of records and additional review of physical, cognitive, psychosocial history related to current functional performance.  CLINICAL DECISION MAKING: LOW - limited treatment options, no task modification necessary  REHAB POTENTIAL: Good  EVALUATION COMPLEXITY: Low    PLAN:  OT FREQUENCY: 2x/week  OT DURATION: 12 weeks plus eval to account for scheduling  PLANNED INTERVENTIONS: self care/ADL training, therapeutic exercise, therapeutic activity, neuromuscular re-education, manual therapy, passive range of motion, gait training, balance training, functional mobility training, aquatic therapy, splinting, ultrasound, paraffin, fluidotherapy, moist heat, cryotherapy, patient/family education, cognitive remediation/compensation, visual/perceptual remediation/compensation, energy conservation, coping strategies training, DME and/or AE instructions, and Re-evaluation  RECOMMENDED OTHER SERVICES: PT, ST  CONSULTED AND AGREED WITH PLAN OF CARE: Patient and family member/caregiver  PLAN FOR NEXT SESSION: d/c OT   Glennda Weatherholtz, OTR/L 04/02/2022, 10:13 AM

## 2022-04-02 NOTE — Patient Instructions (Addendum)
Memory Compensation Strategies  Use "WARM" strategy.  W= write it down  A= associate it  R= repeat it  M= make a mental note  2.   You can keep a Social worker.  Use a 3-ring notebook with sections for the following: calendar, important names and phone numbers,  medications, doctors' names/phone numbers, lists/reminders, and a section to journal what you did  each day.   3.    Use a calendar to write appointments down.  4.    Write yourself a schedule for the day.  This can be placed on the calendar or in a separate section of the Memory Notebook.  Keeping a  regular schedule can help memory.  5.    Use medication organizer with sections for each day or morning/evening pills.  You may need help loading it  6.    Keep a basket, or pegboard by the door.  Place items that you need to take out with you in the basket or on the pegboard.  You may also want to  include a message board for reminders.  7.    Use sticky notes.  Place sticky notes with reminders in a place where the task is performed.  For example: " turn off the  stove" placed by the stove, "lock the door" placed on the door at eye level, " take your medications" on  the bathroom mirror or by the place where you normally take your medications.  8.    Use alarms/timers.  Use while cooking to remind yourself to check on food or as a reminder to take your medicine, or as a  reminder to make a call, or as a reminder to perform another task, etc. Keeping Thinking Skills Sharp: 1. Jigsaw puzzles 2. Card/board games 3. Talking on the phone/social events 4. [employer] 5. Online games 6. Word searches/crossword puzzles 7.  Logic puzzles 8. Aerobic exercise (stationary bike) 9. Eating balanced diet (fruits & veggies) 10. Drink water                   Ways to prevent future Parkinson's related complications: 1.   Exercise regularly,  2.   Focus on BIGGER movements during daily activities- really reach  overhead, straighten elbows and extend fingers 3.   When dressing or reaching for your seatbelt make sure to use your body to assist by twisting while you reach-this can help to minimize stress on the shoulder and reduce the risk of a rotator cuff tear 4.   Move your arms during bag exercises People with PD are at increased risk for frozen shoulder and moving your arms can reduce this risk.

## 2022-05-15 NOTE — Progress Notes (Signed)
ASSESSMENT AND PLAN 77 y.o. year old male   1.  Parkinsonism with cognitive impairment  DaTSCAN in July 2022 showed bilateral decreased radioactive tracing in the putamen and asymmetric decreased radiotracer activity in the head of the right caudate nucleus His cognitive impairment progress quickly compared to the idiopathic typical Parkinson's patient, he seems to have less optimal response to Sinemet, differentiation diagnosis including idiopathic Parkinson's disease with cognitive impairment versus Lewy body dementia Continue higher dose Sinemet 25/100 mg 2 tablets 3 times a day, continues with localized left-sided moderate bradykinesia/rigidity, overall, I do not see any major changes from last visit  Continues with twice weekly spin class, working on getting consistent therapy at home through the [place] [facility] appears stable MoCA 23/30, continue Aricept 10 mg daily Mood is stable on Celexa 20 mg daily  2.  At risk for obstructive sleep apnea Decided not to proceed with sleep study  DIAGNOSTIC DATA (LABS, IMAGING, TESTING) - I reviewed patient records, labs, notes, testing and imaging myself where available.     DaTSCAN in July 2022, bilateral decreased radiotracer activity in the putamen and asymmetric decreased radiotracer activity in the head of right caudate nucleus, this pattern of loss dopamine transporter activity is suggestive of parkinsonian syndrome pathology,    HISTORY  Tony [name] is a 77 year old male, seen in request by his primary care physician Dr. Felipa Eth, Joylene [name] for evaluation of memory loss, unsteady gait, initial evaluation was on March 30, 2020 with his wife,   I reviewed and summarized the referring note.  Past medical history Hyperlipidemia   He has been very active all his life, played basketball until age 34, since he moved to a different house in 2020, he become less active, he doesn't have to do his yard work anymore, had a gradual onset anxiety,  especially noticeable since October 2021, he has difficulty sleeping, taking melatonin, also noticed difficulty driving, could not park properly, he was treated with Lexapro for a long time, dosage was increased to 5 to 10 mg, he complains of intolerable dizziness, has to back down to 5 mg again    He is still walking his neighborhood regularly, 1 to 2 miles each day, but he noticed mild difficulty walking, tripped easily, lost dexterity, was taken to emergency room October 19, he was doing his routine walk, without any clear triggers, he fell face down, he denied loss of consciousness, he does complain dizziness with sudden positional change, such as getting up from prolonged sitting position   Laboratory evaluation October 2021, hemoglobin of 11.9, normal BMP MRI of brain without contrast in Feb 2022 mild generalized atrophy, ventriculomegaly seems to be out of proportion to the generalized atrophy, CT of cervical spine, degenerative changes, no acute fracture   MoCA examination is only 20 out of 30, he has apparent visuospatial difficulties   With his memory loss, parkinsonian features, he was started on Sinemet 25/100 mg since initial visit in February 2022, reported moderate improvement,    Laboratory evaluation February 2022, normal B12, RPR, BMP, CBC showed mildly decreased hemoglobin 11.3, is at his baseline, 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    UPDATE December 25, 2020: He is accompanied by his wife at today's visit,    DaTSCAN in July 2022, bilateral decreased radiotracer activity in the putamen and asymmetric decreased radiotracer activity in the head of right caudate nucleus, this pattern of loss dopamine transporter activity is suggestive of parkinsonian syndrome pathology,   His walking has improved taking Sinemet  25/100 mg 3 times a day, he walks 1/2 mile each day, sometimes uses walking stick, sleeps well most of the time, wake up few times in the middle of the night using  bathroom, moves a lot, talking during his dream,  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, 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[employer], was noted to have mild memory loss, tends to [employer], drinks a lot of water, driving without difficulty, seems to be more depressed anxious as well  UPDATE Sept 14 2023: He is accompanied by his wife and daughter at today's clinical visit, reported decline, slower motion, increased difficulty walking, difficulty buttoning his shirt  MoCA examination 26/30, mostly visual-spatial disorientation, slow processing time,   He is taking Sinemet 25/100 mg 30 minutes after meal, prior he was taking empty stomach, complains of dizziness, nausea is better, today's examination is performed 90 minutes after last dose of Sinemet, significant parkinsonian features, left worse than right  Physical therapy was helpful, is motivated to do cycling regularly every week  He complains of excessive drowsiness, sleepiness, taking frequent naps, loud snoring, narrow oropharyngeal space, at risk for obstructive sleep apnea  Update May 16, 2022 SS: MOCA 23/30. Was rushed today to get here. Fell this morning. About 6 months ago, he felt change in his balance. Up until today, no falls for 6 weeks. Using rolling walker has helped. Gets frustrated with himself, hard to talk without stuttering. Left side has always been slower to move, mostly the arm. [facility] Can get up without pushing off. Is finished with PT. Working with the [facility], trying to get therapy started up again. Hard time bathing, has aide. Here with daughter Tony Braun. On Sinemet 25/100 mg 2 tablets TID 6:30 AM, 12 PM, 7 PM all without food. Wife says memory improved with increasing Sinemet from 1 to 2 tablets. Not much change in movement. No trouble eating or chewing. Does have dreams at night occasionally.  On trazodone.  Decided not to proceed with sleep consult.  He no longer  drives.  REVIEW OF SYSTEMS: Out of a complete 14 system review of symptoms, the patient complains only of the following symptoms, and all other reviewed systems are negative.  See HPI  PHYSICAL EXAM  Vitals:   11/08/21 0932  BP: 120/75  Pulse: 71  Weight: 202 lb (91.6 kg)  Height: 5\' 11"  (1.803 m)   Body mass index is 28.17 kg/m.     05/16/2022    9:39 AM 11/08/2021    9:00 AM 07/04/2021   10:35 AM 12/25/2020   11:00 AM 03/30/2020    8:00 AM  Montreal Cognitive Assessment   Visuospatial/ Executive (0/5) 1 1 0 3 0  Naming (0/3) 3 3 2 3 3   Attention: Read list of digits (0/2) 2 2 2 2 2   Attention: Read list of letters (0/1) 1 1 0 0 1  Attention: Serial 7 subtraction starting at 100 (0/3) 3 3 1 3 2   Language: Repeat phrase (0/2) 1 2 2 2 1   Language : Fluency (0/1) 1 1 1 1 1   Abstraction (0/2) 2 2 2 2 2   Delayed Recall (0/5) 3 2 0 1 2  Orientation (0/6) 6 6 6 6 6   Total 23 23 16 23 20     Generalized:  masked face, voice is soft, speech is slightly drawnout Neurological examination  Cranial nerve II-XII: Pupils were small equal round reactive to light. Extraocular movements  were full, .  Facial symmetric, facial sensation and strength were normal.  Head turning and shoulder shrug  were normal and symmetric.  Motor: Left more than right moderate rigidity, bradykinesia, Sensory: intact to light touch Coordination: No truncal ataxia or limb dysmetria, Gait and station: Need push-up to get up from seated position, absent left side swing, en bloc turning, wide-based Reflexes: Deep tendon reflexes are hypo active and symmetric bilaterally  ALLERGIES: Allergies  Allergen Reactions   Shellfish Allergy Hives and Swelling    HOME MEDICATIONS: Outpatient Medications Prior to Visit  Medication Sig Dispense Refill   atorvastatin (LIPITOR) 20 MG tablet Take 20 mg by mouth daily.     carbidopa-levodopa (SINEMET IR) 25-100 MG tablet Take 2 tablets by mouth 3 (three) times daily. 180  tablet 11   cetirizine (ZYRTEC) 10 MG tablet Take 10 mg by mouth daily.     citalopram (CELEXA) 10 MG tablet Take 2 tablets (20 mg total) by mouth daily. (Patient taking differently: Take 20 mg by mouth at bedtime.) 180 tablet 3   donepezil (ARICEPT) 10 MG tablet Take 1 tablet (10 mg total) by mouth in the morning. 90 tablet 3   Multiple Vitamin (MULTI-VITAMIN DAILY PO) Take 1 tablet by mouth daily.     traZODone (DESYREL) 50 MG tablet      atorvastatin (LIPITOR) 20 MG tablet Take 20 mg by mouth daily.     fexofenadine (ALLEGRA) 60 MG tablet Take 60 mg by mouth 2 (two) times daily.     No facility-administered medications prior to visit.    PAST MEDICAL HISTORY: Past Medical History:  Diagnosis Date   Anxiety    Boil of buttock    High cholesterol    Imbalance    Low back pain    Parkinsons     PAST SURGICAL HISTORY: Past Surgical History:  Procedure Laterality Date   CYST REMOVAL LEG  2014    FAMILY HISTORY: Family History  Problem Relation Age of Onset   Stroke Mother    Heart disease Father    Heart attack Paternal Grandfather    Sleep apnea Neg Hx     SOCIAL HISTORY: Social History   Socioeconomic History   Marital status: Married    Spouse name: Not on file   Number of children: 5   Years of education: college   Highest education level: Bachelor's degree (e.g., BA, AB, BS)  Occupational History   Occupation: Retired  Tobacco Use   Smoking status: Never   Smokeless tobacco: Current  Substance and Sexual Activity   Alcohol use: Yes    Comment: one beer weekly   Drug use: Never   Sexual activity: Not on file  Other Topics Concern   Not on file  Social History Narrative   Lives at home with his wife.   Right-handed.   Caffeine use: recently discontinued daily use of [facility].   Social Determinants of Health   Financial Resource Strain: Not on file  Food Insecurity: Not on file  Transportation Needs: Not on file  Physical Activity: Not on file   Stress: Not on file  Social Connections: Not on file  Intimate Partner Violence: Not on file    Margie Ege, Edrick Oh, DNP  [facility] [address] [place], [place] [address] [phone]

## 2022-05-16 ENCOUNTER — Ambulatory Visit: Payer: PPO | Admitting: Neurology

## 2022-05-16 ENCOUNTER — Encounter: Payer: Self-pay | Admitting: Neurology

## 2022-05-16 ENCOUNTER — Telehealth: Payer: Self-pay | Admitting: Neurology

## 2022-05-16 VITALS — BP 118/72 | HR 89 | Ht 72.0 in | Wt 204.0 lb

## 2022-05-16 DIAGNOSIS — R269 Unspecified abnormalities of gait and mobility: Secondary | ICD-10-CM | POA: Diagnosis not present

## 2022-05-16 DIAGNOSIS — G20C Parkinsonism, unspecified: Secondary | ICD-10-CM

## 2022-05-16 DIAGNOSIS — R413 Other amnesia: Secondary | ICD-10-CM

## 2022-05-16 MED ORDER — CITALOPRAM HYDROBROMIDE 10 MG PO TABS: 20.0000 mg | ORAL_TABLET | Freq: Every day | ORAL | 3 refills | Status: DC

## 2022-05-16 MED ORDER — DONEPEZIL HCL 10 MG PO TABS: 10.0000 mg | ORAL_TABLET | Freq: Every morning | ORAL | 3 refills | Status: DC

## 2022-05-16 MED ORDER — CARBIDOPA-LEVODOPA 25-100 MG PO TABS: 2.0000 | ORAL_TABLET | Freq: Three times a day (TID) | ORAL | 11 refills | Status: DC

## 2022-05-16 NOTE — Telephone Encounter (Signed)
Pt is wanting a call to confirm if his next appointment is to be with Dr Krista Blue or Judson Roch, please call to confirm

## 2022-05-16 NOTE — Telephone Encounter (Signed)
Called pt. Informed him of message nurse Katie sent, Should be good to schedule with sarah. Pt stated he would like to follow up with Dr. Krista Blue at some point because it's been over a year since he seen her for a visit.

## 2022-06-04 DIAGNOSIS — L989 Disorder of the skin and subcutaneous tissue, unspecified: Secondary | ICD-10-CM | POA: Diagnosis not present

## 2022-06-04 DIAGNOSIS — L0291 Cutaneous abscess, unspecified: Secondary | ICD-10-CM | POA: Diagnosis not present

## 2022-06-04 DIAGNOSIS — G20B2 Parkinson's disease with dyskinesia, with fluctuations: Secondary | ICD-10-CM | POA: Diagnosis not present

## 2022-06-04 DIAGNOSIS — R296 Repeated falls: Secondary | ICD-10-CM | POA: Diagnosis not present

## 2022-06-05 NOTE — Progress Notes (Signed)
Chart reviewed, agree above plan ?

## 2022-06-11 ENCOUNTER — Ambulatory Visit: Payer: PPO | Admitting: Physical Therapy

## 2022-06-11 ENCOUNTER — Encounter: Payer: PPO | Admitting: Occupational Therapy

## 2022-06-11 ENCOUNTER — Encounter: Payer: PPO | Admitting: Speech Pathology

## 2022-06-20 DIAGNOSIS — L720 Epidermal cyst: Secondary | ICD-10-CM | POA: Diagnosis not present

## 2022-06-20 DIAGNOSIS — L080 Pyoderma: Secondary | ICD-10-CM | POA: Diagnosis not present

## 2022-06-20 DIAGNOSIS — Z85828 Personal history of other malignant neoplasm of skin: Secondary | ICD-10-CM | POA: Diagnosis not present

## 2022-06-20 DIAGNOSIS — L820 Inflamed seborrheic keratosis: Secondary | ICD-10-CM | POA: Diagnosis not present

## 2022-06-20 DIAGNOSIS — L700 Acne vulgaris: Secondary | ICD-10-CM | POA: Diagnosis not present

## 2022-06-20 DIAGNOSIS — D485 Neoplasm of uncertain behavior of skin: Secondary | ICD-10-CM | POA: Diagnosis not present

## 2022-06-25 ENCOUNTER — Telehealth: Payer: Self-pay | Admitting: Neurology

## 2022-06-25 NOTE — Telephone Encounter (Signed)
We saw him for parkinsonism, cognitive impairment,  Last visit May 16, 2022  It is okay to put him on schedule for next available, nurse practitioner is okay, he can contact his primary care physician for his leg swollen

## 2022-06-25 NOTE — Telephone Encounter (Signed)
Tony [name] call from [facility]. Stated pt needs to be seen before 8/22. Stated pt is having a lot of falls and memory issues. Stated pt right leg has stared swollen the last couple of day.

## 2022-06-26 NOTE — Telephone Encounter (Signed)
Called pt wife. Stated she will keep appointment with Dr. Terrace Arabia for now. I added pt to wait-list for a sooner appointment.

## 2022-07-04 DIAGNOSIS — H0102B Squamous blepharitis left eye, upper and lower eyelids: Secondary | ICD-10-CM | POA: Diagnosis not present

## 2022-07-04 DIAGNOSIS — H0102A Squamous blepharitis right eye, upper and lower eyelids: Secondary | ICD-10-CM | POA: Diagnosis not present

## 2022-07-04 DIAGNOSIS — H5509 Other forms of nystagmus: Secondary | ICD-10-CM | POA: Diagnosis not present

## 2022-07-04 DIAGNOSIS — H16223 Keratoconjunctivitis sicca, not specified as Sjogren's, bilateral: Secondary | ICD-10-CM | POA: Diagnosis not present

## 2022-07-10 DIAGNOSIS — Z111 Encounter for screening for respiratory tuberculosis: Secondary | ICD-10-CM | POA: Diagnosis not present

## 2022-07-12 DIAGNOSIS — G4700 Insomnia, unspecified: Secondary | ICD-10-CM | POA: Diagnosis not present

## 2022-07-12 DIAGNOSIS — G20B2 Parkinson's disease with dyskinesia, with fluctuations: Secondary | ICD-10-CM | POA: Diagnosis not present

## 2022-07-12 DIAGNOSIS — J309 Allergic rhinitis, unspecified: Secondary | ICD-10-CM | POA: Diagnosis not present

## 2022-07-12 DIAGNOSIS — R296 Repeated falls: Secondary | ICD-10-CM | POA: Diagnosis not present

## 2022-07-12 DIAGNOSIS — F39 Unspecified mood [affective] disorder: Secondary | ICD-10-CM | POA: Diagnosis not present

## 2022-07-12 DIAGNOSIS — G629 Polyneuropathy, unspecified: Secondary | ICD-10-CM | POA: Diagnosis not present

## 2022-07-12 DIAGNOSIS — E785 Hyperlipidemia, unspecified: Secondary | ICD-10-CM | POA: Diagnosis not present

## 2022-07-12 DIAGNOSIS — D568 Other thalassemias: Secondary | ICD-10-CM | POA: Diagnosis not present

## 2022-07-12 DIAGNOSIS — R600 Localized edema: Secondary | ICD-10-CM | POA: Diagnosis not present

## 2022-07-12 DIAGNOSIS — Z029 Encounter for administrative examinations, unspecified: Secondary | ICD-10-CM | POA: Diagnosis not present

## 2022-07-20 DIAGNOSIS — H6123 Impacted cerumen, bilateral: Secondary | ICD-10-CM | POA: Diagnosis not present

## 2022-07-29 ENCOUNTER — Telehealth: Payer: Self-pay | Admitting: Neurology

## 2022-07-29 NOTE — Telephone Encounter (Signed)
Called number in note and got a VM Called daughter on DPR and spoke to her and got number for [facility] .[phone] Will try to call them to see what is needed

## 2022-07-29 NOTE — Telephone Encounter (Signed)
I was able to speak to Vernona Rieger at [facility] and she states the dosing that the facility has is not the most recent on him med list, she states a signed prescription of the medications with updated dosing will allow them to provide the correct dose to the patient. Will print meds and place in office to fax to facility. Vernona Rieger gave fax number of [phone]

## 2022-07-29 NOTE — Telephone Encounter (Signed)
Called Tony [name] back and let her know I would send first thing in the morning to the facility, since Maralyn Sago is out of office today

## 2022-07-29 NOTE — Telephone Encounter (Signed)
Pt's daughter states pt has been moved into [facility] on Friday.  The one medication that the facility has not been able to give him is the donepezil (ARICEPT) 10 MG tablet.  Daughter states The facility is asking that someone calls them so they can go about giving pt this medication, daughter gave the name of Vernona Rieger which is Catering manager or Katie(Head Charity fundraiser)  so a script can be provided for pt to get this medication, [facility] Address: [address] [place], [place] [address] Hours:  Open 24 hours Phone: [phone] Daughter is asking for a call, she would like to know if pt can take Melatonin, please call.

## 2022-07-30 NOTE — Telephone Encounter (Signed)
Faxed med list to provided number

## 2022-07-31 NOTE — Telephone Encounter (Signed)
[facility] called stating they had not received an updated and signed med list for the patient. The fax verification was present. I have faxed the orders again to the facility at [phone].

## 2022-08-02 DIAGNOSIS — G20B2 Parkinson's disease with dyskinesia, with fluctuations: Secondary | ICD-10-CM | POA: Diagnosis not present

## 2022-08-02 DIAGNOSIS — E041 Nontoxic single thyroid nodule: Secondary | ICD-10-CM | POA: Diagnosis not present

## 2022-08-02 DIAGNOSIS — Z9181 History of falling: Secondary | ICD-10-CM | POA: Diagnosis not present

## 2022-08-02 DIAGNOSIS — D568 Other thalassemias: Secondary | ICD-10-CM | POA: Diagnosis not present

## 2022-08-02 DIAGNOSIS — G4700 Insomnia, unspecified: Secondary | ICD-10-CM | POA: Diagnosis not present

## 2022-08-02 DIAGNOSIS — M5136 Other intervertebral disc degeneration, lumbar region: Secondary | ICD-10-CM | POA: Diagnosis not present

## 2022-08-02 DIAGNOSIS — G629 Polyneuropathy, unspecified: Secondary | ICD-10-CM | POA: Diagnosis not present

## 2022-08-02 DIAGNOSIS — E785 Hyperlipidemia, unspecified: Secondary | ICD-10-CM | POA: Diagnosis not present

## 2022-08-02 DIAGNOSIS — F39 Unspecified mood [affective] disorder: Secondary | ICD-10-CM | POA: Diagnosis not present

## 2022-08-02 DIAGNOSIS — D649 Anemia, unspecified: Secondary | ICD-10-CM | POA: Diagnosis not present

## 2022-09-04 ENCOUNTER — Encounter (HOSPITAL_COMMUNITY): Payer: Self-pay

## 2022-09-04 ENCOUNTER — Inpatient Hospital Stay (HOSPITAL_COMMUNITY)
Admission: EM | Admit: 2022-09-04 | Discharge: 2022-09-09 | DRG: 184 | Disposition: A | Discharge status: Skilled Nursing Facility | Payer: PPO | Attending: Family Medicine | Admitting: Family Medicine

## 2022-09-04 ENCOUNTER — Emergency Department (HOSPITAL_COMMUNITY): Payer: PPO

## 2022-09-04 ENCOUNTER — Other Ambulatory Visit: Payer: Self-pay

## 2022-09-04 DIAGNOSIS — F17290 Nicotine dependence, other tobacco product, uncomplicated: Secondary | ICD-10-CM | POA: Diagnosis present

## 2022-09-04 DIAGNOSIS — E785 Hyperlipidemia, unspecified: Secondary | ICD-10-CM | POA: Diagnosis not present

## 2022-09-04 DIAGNOSIS — Z91013 Allergy to seafood: Secondary | ICD-10-CM | POA: Diagnosis not present

## 2022-09-04 DIAGNOSIS — Z79899 Other long term (current) drug therapy: Secondary | ICD-10-CM | POA: Diagnosis not present

## 2022-09-04 DIAGNOSIS — M546 Pain in thoracic spine: Secondary | ICD-10-CM | POA: Diagnosis not present

## 2022-09-04 DIAGNOSIS — G4700 Insomnia, unspecified: Secondary | ICD-10-CM | POA: Diagnosis not present

## 2022-09-04 DIAGNOSIS — N400 Enlarged prostate without lower urinary tract symptoms: Secondary | ICD-10-CM | POA: Diagnosis present

## 2022-09-04 DIAGNOSIS — S2241XA Multiple fractures of ribs, right side, initial encounter for closed fracture: Secondary | ICD-10-CM | POA: Diagnosis not present

## 2022-09-04 DIAGNOSIS — Y92009 Unspecified place in unspecified non-institutional (private) residence as the place of occurrence of the external cause: Secondary | ICD-10-CM

## 2022-09-04 DIAGNOSIS — G20A1 Parkinson's disease without dyskinesia, without mention of fluctuations: Secondary | ICD-10-CM | POA: Diagnosis present

## 2022-09-04 DIAGNOSIS — Z9181 History of falling: Secondary | ICD-10-CM | POA: Diagnosis not present

## 2022-09-04 DIAGNOSIS — R4189 Other symptoms and signs involving cognitive functions and awareness: Secondary | ICD-10-CM | POA: Diagnosis present

## 2022-09-04 DIAGNOSIS — F0284 Dementia in other diseases classified elsewhere, unspecified severity, with anxiety: Secondary | ICD-10-CM | POA: Diagnosis not present

## 2022-09-04 DIAGNOSIS — R4182 Altered mental status, unspecified: Secondary | ICD-10-CM | POA: Diagnosis not present

## 2022-09-04 DIAGNOSIS — W010XXA Fall on same level from slipping, tripping and stumbling without subsequent striking against object, initial encounter: Secondary | ICD-10-CM | POA: Diagnosis present

## 2022-09-04 DIAGNOSIS — F32A Depression, unspecified: Secondary | ICD-10-CM | POA: Diagnosis present

## 2022-09-04 DIAGNOSIS — F419 Anxiety disorder, unspecified: Secondary | ICD-10-CM | POA: Diagnosis not present

## 2022-09-04 DIAGNOSIS — K5730 Diverticulosis of large intestine without perforation or abscess without bleeding: Secondary | ICD-10-CM | POA: Diagnosis not present

## 2022-09-04 DIAGNOSIS — F02818 Dementia in other diseases classified elsewhere, unspecified severity, with other behavioral disturbance: Secondary | ICD-10-CM | POA: Diagnosis not present

## 2022-09-04 DIAGNOSIS — S2241XD Multiple fractures of ribs, right side, subsequent encounter for fracture with routine healing: Secondary | ICD-10-CM | POA: Diagnosis not present

## 2022-09-04 DIAGNOSIS — M549 Dorsalgia, unspecified: Secondary | ICD-10-CM | POA: Diagnosis present

## 2022-09-04 DIAGNOSIS — D649 Anemia, unspecified: Secondary | ICD-10-CM | POA: Diagnosis not present

## 2022-09-04 DIAGNOSIS — S0990XA Unspecified injury of head, initial encounter: Secondary | ICD-10-CM | POA: Diagnosis not present

## 2022-09-04 DIAGNOSIS — R2681 Unsteadiness on feet: Secondary | ICD-10-CM | POA: Diagnosis present

## 2022-09-04 DIAGNOSIS — G20C Parkinsonism, unspecified: Secondary | ICD-10-CM

## 2022-09-04 DIAGNOSIS — R52 Pain, unspecified: Secondary | ICD-10-CM | POA: Diagnosis not present

## 2022-09-04 DIAGNOSIS — D638 Anemia in other chronic diseases classified elsewhere: Secondary | ICD-10-CM | POA: Diagnosis not present

## 2022-09-04 DIAGNOSIS — Y9301 Activity, walking, marching and hiking: Secondary | ICD-10-CM | POA: Diagnosis present

## 2022-09-04 DIAGNOSIS — E7800 Pure hypercholesterolemia, unspecified: Secondary | ICD-10-CM | POA: Diagnosis not present

## 2022-09-04 DIAGNOSIS — I700 Atherosclerosis of aorta: Secondary | ICD-10-CM | POA: Diagnosis not present

## 2022-09-04 DIAGNOSIS — F0390 Unspecified dementia without behavioral disturbance: Secondary | ICD-10-CM | POA: Diagnosis not present

## 2022-09-04 DIAGNOSIS — R2689 Other abnormalities of gait and mobility: Secondary | ICD-10-CM | POA: Diagnosis not present

## 2022-09-04 DIAGNOSIS — F0283 Dementia in other diseases classified elsewhere, unspecified severity, with mood disturbance: Secondary | ICD-10-CM | POA: Diagnosis present

## 2022-09-04 DIAGNOSIS — S2249XA Multiple fractures of ribs, unspecified side, initial encounter for closed fracture: Secondary | ICD-10-CM | POA: Diagnosis present

## 2022-09-04 DIAGNOSIS — Z7401 Bed confinement status: Secondary | ICD-10-CM | POA: Diagnosis not present

## 2022-09-04 DIAGNOSIS — Z8249 Family history of ischemic heart disease and other diseases of the circulatory system: Secondary | ICD-10-CM

## 2022-09-04 DIAGNOSIS — W19XXXA Unspecified fall, initial encounter: Secondary | ICD-10-CM | POA: Diagnosis not present

## 2022-09-04 DIAGNOSIS — R471 Dysarthria and anarthria: Secondary | ICD-10-CM | POA: Diagnosis not present

## 2022-09-04 DIAGNOSIS — M6281 Muscle weakness (generalized): Secondary | ICD-10-CM | POA: Diagnosis not present

## 2022-09-04 LAB — I-STAT CHEM 8, ED
BUN: 13 mg/dL (ref 8–23)
Calcium, Ion: 1.19 mmol/L (ref 1.15–1.40)
Chloride: 103 mmol/L (ref 98–111)
Creatinine, Ser: 1 mg/dL (ref 0.61–1.24)
Glucose, Bld: 112 mg/dL — ABNORMAL HIGH (ref 70–99)
HCT: 34 % — ABNORMAL LOW (ref 39.0–52.0)
Hemoglobin: 11.6 g/dL — ABNORMAL LOW (ref 13.0–17.0)
Potassium: 3.9 mmol/L (ref 3.5–5.1)
Sodium: 139 mmol/L (ref 135–145)
TCO2: 25 mmol/L (ref 22–32)

## 2022-09-04 LAB — CBC WITH DIFFERENTIAL/PLATELET
Abs Immature Granulocytes: 0.03 10*3/uL (ref 0.00–0.07)
Basophils Absolute: 0 10*3/uL (ref 0.0–0.1)
Basophils Relative: 0 %
Eosinophils Absolute: 0.1 10*3/uL (ref 0.0–0.5)
Eosinophils Relative: 1 %
HCT: 32 % — ABNORMAL LOW (ref 39.0–52.0)
Hemoglobin: 10.3 g/dL — ABNORMAL LOW (ref 13.0–17.0)
Immature Granulocytes: 0 %
Lymphocytes Relative: 17 %
Lymphs Abs: 1.7 10*3/uL (ref 0.7–4.0)
MCH: 20.9 pg — ABNORMAL LOW (ref 26.0–34.0)
MCHC: 32.2 g/dL (ref 30.0–36.0)
MCV: 64.9 fL — ABNORMAL LOW (ref 80.0–100.0)
Monocytes Absolute: 0.7 10*3/uL (ref 0.1–1.0)
Monocytes Relative: 7 %
Neutro Abs: 7.3 10*3/uL (ref 1.7–7.7)
Neutrophils Relative %: 75 %
Platelets: 194 10*3/uL (ref 150–400)
RBC: 4.93 MIL/uL (ref 4.22–5.81)
RDW: 15.2 % (ref 11.5–15.5)
WBC: 9.7 10*3/uL (ref 4.0–10.5)
nRBC: 0 % (ref 0.0–0.2)

## 2022-09-04 MED ORDER — OXYCODONE HCL 5 MG PO TABS
2.5000 mg | ORAL_TABLET | ORAL | Status: DC | PRN
  Administered 2022-09-04 – 2022-09-05 (×2): 2.5 mg via ORAL
  Administered 2022-09-08 (×2): 5 mg via ORAL
  Filled 2022-09-04 (×4): qty 1

## 2022-09-04 MED ORDER — ACETAMINOPHEN 500 MG PO TABS
1000.0000 mg | ORAL_TABLET | Freq: Three times a day (TID) | ORAL | Status: DC
  Administered 2022-09-04 – 2022-09-09 (×13): 1000 mg via ORAL
  Filled 2022-09-04 (×13): qty 2

## 2022-09-04 MED ORDER — ONDANSETRON HCL 4 MG/2ML IJ SOLN: 4.0000 mg | Freq: Four times a day (QID) | INTRAMUSCULAR | Status: DC | PRN

## 2022-09-04 MED ORDER — TRAZODONE HCL 50 MG PO TABS
50.0000 mg | ORAL_TABLET | Freq: Every day | ORAL | Status: DC
  Administered 2022-09-04 – 2022-09-08 (×5): 50 mg via ORAL
  Filled 2022-09-04 (×5): qty 1

## 2022-09-04 MED ORDER — ACETAMINOPHEN 325 MG PO TABS
650.0000 mg | ORAL_TABLET | Freq: Once | ORAL | Status: AC
  Administered 2022-09-04: 650 mg via ORAL
  Filled 2022-09-04: qty 2

## 2022-09-04 MED ORDER — CARBIDOPA-LEVODOPA 25-100 MG PO TABS
2.0000 | ORAL_TABLET | ORAL | Status: DC
  Administered 2022-09-05 – 2022-09-09 (×14): 2 via ORAL
  Filled 2022-09-04 (×14): qty 2

## 2022-09-04 MED ORDER — DONEPEZIL HCL 10 MG PO TABS
10.0000 mg | ORAL_TABLET | Freq: Every day | ORAL | Status: DC
  Administered 2022-09-05 – 2022-09-09 (×5): 10 mg via ORAL
  Filled 2022-09-04 (×5): qty 1

## 2022-09-04 MED ORDER — GUAIFENESIN ER 600 MG PO TB12: 600.0000 mg | ORAL_TABLET | Freq: Two times a day (BID) | ORAL | Status: DC | PRN

## 2022-09-04 MED ORDER — ATORVASTATIN CALCIUM 20 MG PO TABS
20.0000 mg | ORAL_TABLET | Freq: Every evening | ORAL | Status: DC
  Administered 2022-09-05 – 2022-09-08 (×4): 20 mg via ORAL
  Filled 2022-09-04 (×4): qty 1

## 2022-09-04 MED ORDER — MELATONIN 3 MG PO TABS
3.0000 mg | ORAL_TABLET | Freq: Every evening | ORAL | Status: DC | PRN
  Administered 2022-09-04 – 2022-09-07 (×4): 3 mg via ORAL
  Filled 2022-09-04 (×4): qty 1

## 2022-09-04 MED ORDER — MORPHINE SULFATE (PF) 4 MG/ML IV SOLN
4.0000 mg | INTRAVENOUS | Status: DC | PRN
  Administered 2022-09-04: 4 mg via INTRAVENOUS
  Filled 2022-09-04: qty 1

## 2022-09-04 MED ORDER — ONDANSETRON HCL 4 MG PO TABS: 4.0000 mg | ORAL_TABLET | Freq: Four times a day (QID) | ORAL | Status: DC | PRN

## 2022-09-04 MED ORDER — ESCITALOPRAM OXALATE 20 MG PO TABS
20.0000 mg | ORAL_TABLET | Freq: Every day | ORAL | Status: DC
  Administered 2022-09-05 – 2022-09-09 (×5): 20 mg via ORAL
  Filled 2022-09-04 (×5): qty 1

## 2022-09-04 MED ORDER — HEPARIN SODIUM (PORCINE) 5000 UNIT/ML IJ SOLN
5000.0000 [IU] | Freq: Three times a day (TID) | INTRAMUSCULAR | Status: DC
  Administered 2022-09-05 – 2022-09-09 (×13): 5000 [IU] via SUBCUTANEOUS
  Filled 2022-09-04 (×13): qty 1

## 2022-09-04 MED ORDER — IBUPROFEN 400 MG PO TABS
400.0000 mg | ORAL_TABLET | Freq: Four times a day (QID) | ORAL | Status: DC
  Administered 2022-09-04 – 2022-09-09 (×16): 400 mg via ORAL
  Filled 2022-09-04 (×5): qty 1
  Filled 2022-09-04: qty 2
  Filled 2022-09-04 (×10): qty 1

## 2022-09-04 MED ORDER — METHOCARBAMOL 500 MG PO TABS
500.0000 mg | ORAL_TABLET | Freq: Three times a day (TID) | ORAL | Status: DC
  Administered 2022-09-04 – 2022-09-09 (×12): 500 mg via ORAL
  Filled 2022-09-04 (×12): qty 1

## 2022-09-04 NOTE — ED Provider Notes (Signed)
[place] [facility] AT [facility] Provider Note   CSN: [id number] Arrival date & time: 09/04/22  1527     History  Chief Complaint  Patient presents with   Tony [name] is a 77 y.o. male.  HPI    77 year old male comes in with chief complaint of mechanical fall.  Patient has history of Parkinson's disease.  He states that he was walking to the kitchen with his socks on, slipped and fell.  In the process he struck his right back onto marble kitchen tile.  He is having significant discomfort over the right back.  Patient unsure if he struck his head.  He is having no pelvic or lower extremity pain.  Patient is not on any blood thinners.  Pain worse with deep inspiration.  Home Medications Prior to Admission medications   Medication Sig Start Date End Date Taking? Authorizing Provider  atorvastatin (LIPITOR) 20 MG tablet Take 20 mg by mouth daily.    [provider]  carbidopa-levodopa (SINEMET IR) 25-100 MG tablet Take 2 tablets by mouth 3 (three) times daily. 05/16/22   Glean Salvo, NP  cetirizine (ZYRTEC) 10 MG tablet Take 10 mg by mouth daily.    [provider]  citalopram (CELEXA) 10 MG tablet Take 2 tablets (20 mg total) by mouth daily. 05/16/22   Glean Salvo, NP  donepezil (ARICEPT) 10 MG tablet Take 1 tablet (10 mg total) by mouth in the morning. 05/16/22   Glean Salvo, NP  Multiple Vitamin (MULTI-VITAMIN DAILY PO) Take 1 tablet by mouth daily.    [provider]  traZODone (DESYREL) 50 MG tablet  09/24/21   [provider]      Allergies    Shellfish allergy    Review of Systems   Review of Systems  All other systems reviewed and are negative.   Physical Exam Updated Vital Signs BP (!) 160/77 (BP Location: Right Arm)   Pulse 76   Temp 97.7 F (36.5 C) (Oral)   Resp 18   Ht 6' (1.829 m)   Wt 91.6 kg   SpO2 100%   BMI 27.40 kg/m  Physical Exam Vitals and nursing note reviewed.   Constitutional:      Appearance: He is well-developed.  HENT:     Head: Atraumatic.  Eyes:     Extraocular Movements: Extraocular movements intact.     Pupils: Pupils are equal, round, and reactive to light.  Neck:     Comments: No midline c-spine tenderness, pt able to turn head to 45 degrees bilaterally without any pain and able to flex neck to the chest and extend without any pain or neurologic symptoms.  Cardiovascular:     Rate and Rhythm: Normal rate.  Pulmonary:     Effort: Pulmonary effort is normal.  Musculoskeletal:        General: Tenderness and signs of injury present. No deformity.     Cervical back: Neck supple.     Comments: Patient has right flank ecchymosis with tenderness to palpation and some mild crepitus over the right lower thoracic region. Bilateral breath sounds noted.  Skin:    General: Skin is warm.  Neurological:     Mental Status: He is alert and oriented to person, place, and time.     ED Results / Procedures / Treatments   Labs (all labs ordered are listed, but only abnormal results are displayed) Labs Reviewed  I-STAT CHEM 8, ED -  Abnormal; Notable for the following components:      Result Value   Glucose, Bld 112 (*)    Hemoglobin 11.6 (*)    HCT 34.0 (*)    All other components within normal limits  CBC WITH DIFFERENTIAL/PLATELET    EKG None  Radiology CT ABDOMEN PELVIS WO CONTRAST  Result Date: 09/04/2022 CLINICAL DATA:  Trauma. EXAM: CT CHEST, ABDOMEN AND PELVIS WITHOUT CONTRAST TECHNIQUE: Multidetector CT imaging of the chest, abdomen and pelvis was performed following the standard protocol without IV contrast. RADIATION DOSE REDUCTION: This exam was performed according to the departmental dose-optimization program which includes automated exposure control, adjustment of the mA and/or kV according to patient size and/or use of iterative reconstruction technique. COMPARISON:  None Available. FINDINGS: Evaluation of this exam is limited  in the absence of intravenous contrast. CT CHEST FINDINGS Cardiovascular: There is no cardiomegaly or pericardial effusion. There is 3 vessel coronary vascular calcification. Mild atherosclerotic calcification of the thoracic aorta. No aneurysmal dilatation. The central pulmonary arteries are grossly unremarkable on this noncontrast CT. Mediastinum/Nodes: No hilar or mediastinal adenopathy. Small hiatal hernia. The esophagus is grossly unremarkable. Asymmetric enlargement of the left thyroid lobe with a 3.5 cm nodule. Recommend thyroid US (ref: J Am Coll Radiol. 2015 Feb;12(2): 143-50).No mediastinal fluid collection. Lungs/Pleura: No focal consolidation, pleural effusion, or pneumothorax. The central airways are patent. Musculoskeletal: Osteopenia with degenerative changes of the spine. Mildly displaced fractures of the posterior right 10th-12th ribs. CT ABDOMEN PELVIS FINDINGS No intra-abdominal free air or free fluid. Hepatobiliary: The liver is unremarkable. No biliary dilatation. The gallbladder is unremarkable. Pancreas: Unremarkable. No pancreatic ductal dilatation or surrounding inflammatory changes. Spleen: Normal in size without focal abnormality. Adrenals/Urinary Tract: The adrenal glands are unremarkable. The kidneys, and the visualized ureters appear unremarkable. There is trabeculated appearance of the bladder wall suggestive of chronic bladder outlet obstruction. Stomach/Bowel: Moderate stool throughout the colon. There is sigmoid diverticulosis without active inflammatory changes. There is no bowel obstruction or active inflammation. The appendix is normal. Vascular/Lymphatic: Moderate aortoiliac atherosclerotic disease. The IVC is unremarkable. No portal venous gas. There is no adenopathy. Reproductive: Enlarged prostate gland measuring 5.7 cm in transverse axial diameter. The seminal vesicles are symmetric. Other: Small fat containing umbilical hernia. Musculoskeletal: Osteopenia with degenerative  changes of the spine. No acute osseous pathology. Indeterminate 3.4 x 2.1 cm low attenuating, possibly cystic nodule in the subcutaneous soft tissues of the left anterior pelvic wall may represent a sebaceous cyst. Further evaluation with ultrasound on a nonemergent/outpatient basis recommended. IMPRESSION: 1. Mildly displaced fractures of the posterior right 10th-12th ribs. No pneumothorax. 2. No acute/traumatic intra-abdominal or pelvic pathology. 3. Sigmoid diverticulosis. No bowel obstruction. Normal appendix. 4. 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 5.  Aortic Atherosclerosis (ICD10-I70.0). Electronically Signed   By: Elgie Collard M.D.   On: 09/04/2022 18:29   CT CHEST WO CONTRAST  Result Date: 09/04/2022 CLINICAL DATA:  Trauma. EXAM: CT CHEST, ABDOMEN AND PELVIS WITHOUT CONTRAST TECHNIQUE: Multidetector CT imaging of the chest, abdomen and pelvis was performed following the standard protocol without IV contrast. RADIATION DOSE REDUCTION: This exam was performed according to the departmental dose-optimization program which includes automated exposure control, adjustment of the mA and/or kV according to patient size and/or use of iterative reconstruction technique. COMPARISON:  None Available. FINDINGS: Evaluation of this exam is limited in the absence of intravenous contrast. CT CHEST FINDINGS Cardiovascular: There is no cardiomegaly or pericardial effusion. There is 3 vessel coronary  vascular calcification. Mild atherosclerotic calcification of the thoracic aorta. No aneurysmal dilatation. The central pulmonary arteries are grossly unremarkable on this noncontrast CT. Mediastinum/Nodes: No hilar or mediastinal adenopathy. Small hiatal hernia. The esophagus is grossly unremarkable. Asymmetric enlargement of the left thyroid lobe with a 3.5 cm nodule. Recommend thyroid US (ref: J Am Coll Radiol. 2015 Feb;12(2): 143-50).No mediastinal fluid collection. Lungs/Pleura:  No focal consolidation, pleural effusion, or pneumothorax. The central airways are patent. Musculoskeletal: Osteopenia with degenerative changes of the spine. Mildly displaced fractures of the posterior right 10th-12th ribs. CT ABDOMEN PELVIS FINDINGS No intra-abdominal free air or free fluid. Hepatobiliary: The liver is unremarkable. No biliary dilatation. The gallbladder is unremarkable. Pancreas: Unremarkable. No pancreatic ductal dilatation or surrounding inflammatory changes. Spleen: Normal in size without focal abnormality. Adrenals/Urinary Tract: The adrenal glands are unremarkable. The kidneys, and the visualized ureters appear unremarkable. There is trabeculated appearance of the bladder wall suggestive of chronic bladder outlet obstruction. Stomach/Bowel: Moderate stool throughout the colon. There is sigmoid diverticulosis without active inflammatory changes. There is no bowel obstruction or active inflammation. The appendix is normal. Vascular/Lymphatic: Moderate aortoiliac atherosclerotic disease. The IVC is unremarkable. No portal venous gas. There is no adenopathy. Reproductive: Enlarged prostate gland measuring 5.7 cm in transverse axial diameter. The seminal vesicles are symmetric. Other: Small fat containing umbilical hernia. Musculoskeletal: Osteopenia with degenerative changes of the spine. No acute osseous pathology. Indeterminate 3.4 x 2.1 cm low attenuating, possibly cystic nodule in the subcutaneous soft tissues of the left anterior pelvic wall may represent a sebaceous cyst. Further evaluation with ultrasound on a nonemergent/outpatient basis recommended. IMPRESSION: 1. Mildly displaced fractures of the posterior right 10th-12th ribs. No pneumothorax. 2. No acute/traumatic intra-abdominal or pelvic pathology. 3. Sigmoid diverticulosis. No bowel obstruction. Normal appendix. 4. 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 5.  Aortic Atherosclerosis  (ICD10-I70.0). Electronically Signed   By: Elgie Collard M.D.   On: 09/04/2022 18:29   CT Head Wo Contrast  Result Date: 09/04/2022 CLINICAL DATA:  Head trauma, minor (Age >= 65y), fall EXAM: CT HEAD WITHOUT CONTRAST TECHNIQUE: Contiguous axial images were obtained from the base of the skull through the vertex without intravenous contrast. RADIATION DOSE REDUCTION: This exam was performed according to the departmental dose-optimization program which includes automated exposure control, adjustment of the mA and/or kV according to patient size and/or use of iterative reconstruction technique. COMPARISON:  None Available. FINDINGS: Brain: Diffuse cerebral atrophy. No acute intracranial abnormality. Specifically, no hemorrhage, hydrocephalus, mass lesion, acute infarction, or significant intracranial injury. Vascular: No hyperdense vessel or unexpected calcification. Skull: No acute calvarial abnormality. Sinuses/Orbits: Mucosal thickening throughout the right paranasal sinuses. No air-fluid levels. Other: None IMPRESSION: Diffuse cerebral atrophy.  No acute intracranial abnormality. Right chronic sinusitis. Electronically Signed   By: Charlett Nose M.D.   On: 09/04/2022 18:15   DG Ribs Unilateral W/Chest Right  Result Date: 09/04/2022 CLINICAL DATA:  fall EXAM: RIGHT RIBS AND CHEST - 3+ VIEW COMPARISON:  September 11, 2006 FINDINGS: There is a minimally displaced fracture of the RIGHT tenth and eleventh ribs. Nondisplaced fracture of the RIGHT twelfth rib. No pneumothorax or pleural effusion. Atherosclerotic calcifications of the aorta. IMPRESSION: Minimally displaced fractures of the RIGHT tenth and eleventh ribs. Nondisplaced fracture of the RIGHT twelfth rib. Electronically Signed   By: Meda Klinefelter M.D.   On: 09/04/2022 18:03    Procedures .Critical Care  Performed by: Derwood Kaplan, MD Authorized by: Derwood Kaplan, MD   Critical care provider  statement:    Critical care time (minutes):  39    Critical care was time spent personally by me on the following activities:  Development of treatment plan with patient or surrogate, discussions with consultants, evaluation of patient's response to treatment, examination of patient, ordering and review of laboratory studies, ordering and review of radiographic studies, ordering and performing treatments and interventions, pulse oximetry, re-evaluation of patient's condition and review of old charts     Medications Ordered in ED Medications  morphine (PF) 4 MG/ML injection 4 mg (4 mg Intravenous Given 09/04/22 2001)  acetaminophen (TYLENOL) tablet 650 mg (650 mg Oral Given 09/04/22 1728)    ED Course/ Medical Decision Making/ A&P                             Medical Decision Making Amount and/or Complexity of Data Reviewed Labs: ordered. Radiology: ordered.  Risk OTC drugs. Prescription drug management. Decision regarding hospitalization.  77 year old patient comes in with chief complaint of mechanical fall.  Patient had a mechanical fall, likely owing to his Parkinson's disease and the having socks on.  History  Patient.  Substantial part of the history also provided by patient's wife who is at the bedside.  DDx includes: -Rib fracture - ICH - Contusions - Soft tissue injury -Intra-abdominal bleed -Organ fracture/laceration  Plan is to get x-ray of the ribs, CT abdomen pelvis without contrast ordered given significant pain.  I have independently reviewed patient's chest x-ray, there appears to be possible multiple fractures.  We will add CT chest to the CT abdomen pelvis without contrast.  CT scan of the brain also ordered.  At this time, patient has no neck pain and is moving the neck freely.  I still feel comfortable clearing his C-spine clinically.   8:38 PM Tylenol and then morphine given. Better with morphine. Patient wants to leave, however family is a little apprehensive.  Patient still has discomfort when he  tries to move in his bed.  He has history of Parkinson's and combination of pain medicine and baseline balance issues likely not the best.  He resides at assisted living.  Family states that they will communicate his needs to the assisted living tomorrow so that they are better prepared and helping him as well.  I will request admission at this time for pain control.  I spoke with Dr. Sophronia Simas, general surgery.  They will see the patient as well.  Final Clinical Impression(s) / ED Diagnoses Final diagnoses:  Closed fracture of multiple ribs of right side, initial encounter    Rx / DC Orders ED Discharge Orders     None         Derwood Kaplan, MD 09/04/22 2038

## 2022-09-04 NOTE — ED Triage Notes (Addendum)
Patient BIB [facility] from [facility]. Fell onto his right flank/upper buttocks on the corner of countertop after losing his balance after opening a door. 2 inch scrape. No LOC. Painful to take a deep breath.

## 2022-09-04 NOTE — Consult Note (Signed)
Tony [name] [birth year]  [id number].    Requesting MD: Dr. Derwood Kaplan Chief Complaint/Reason for Consult: rib fractures  HPI:  Tony [name] is a 77 you male with a history of Parkinson's disease who presented to the ED after a fall at home. He was walking with socks on and slipped and fell, and landed on his back. He endorsed right sided back pain on arrival and has been stable. CT scans in the ED showed 3 right posterior rib fractures, with no associated hemopneumothorax and no other acute injuries. The patient is being admitted to medicine at [facility] and general surgery was consulted. ***  ROS: ROS  Family History  Problem Relation Age of Onset   Stroke Mother    Heart disease Father    Heart attack Paternal Grandfather    Sleep apnea Neg Hx     Past Medical History:  Diagnosis Date   Anxiety    Boil of buttock    High cholesterol    Imbalance    Low back pain    Parkinsons     Past Surgical History:  Procedure Laterality Date   CYST REMOVAL LEG  2014    Social History:  reports that he has never smoked. He uses smokeless tobacco. He reports current alcohol use. He reports that he does not use drugs.  Allergies:  Allergies  Allergen Reactions   Shellfish Allergy Hives and Swelling    (Not in a hospital admission)    Physical Exam: Blood pressure (!) 160/77, pulse 76, temperature 97.7 F (36.5 C), temperature source Oral, resp. rate 18, height 6' (1.829 m), weight 91.6 kg, SpO2 100 %. ***  Results for orders placed or performed during the hospital encounter of 09/04/22 (from the past 48 hour(s))  I-stat chem 8, ED (not at [facility], [facility] or [facility])     Status: Abnormal   Collection Time: 09/04/22  6:26 PM  Result Value Ref Range   Sodium 139 135 - 145 mmol/L   Potassium 3.9 3.5 - 5.1 mmol/L   Chloride 103 98 - 111 mmol/L   BUN 13 8 - 23 mg/dL   Creatinine, Ser 4.69 0.61 - 1.24 mg/dL   Glucose, Bld 629 (H) 70 - 99 mg/dL    Comment: Glucose reference range applies  only to samples taken after fasting for at least 8 hours.   Calcium, Ion 1.19 1.15 - 1.40 mmol/L   TCO2 25 22 - 32 mmol/L   Hemoglobin 11.6 (L) 13.0 - 17.0 g/dL   HCT 52.8 (L) 41.3 - 24.4 %   CT ABDOMEN PELVIS WO CONTRAST  Result Date: 09/04/2022 CLINICAL DATA:  Trauma. EXAM: CT CHEST, ABDOMEN AND PELVIS WITHOUT CONTRAST TECHNIQUE: Multidetector CT imaging of the chest, abdomen and pelvis was performed following the standard protocol without IV contrast. RADIATION DOSE REDUCTION: This exam was performed according to the departmental dose-optimization program which includes automated exposure control, adjustment of the mA and/or kV according to patient size and/or use of iterative reconstruction technique. COMPARISON:  None Available. FINDINGS: Evaluation of this exam is limited in the absence of intravenous contrast. CT CHEST FINDINGS Cardiovascular: There is no cardiomegaly or pericardial effusion. There is 3 vessel coronary vascular calcification. Mild atherosclerotic calcification of the thoracic aorta. No aneurysmal dilatation. The central pulmonary arteries are grossly unremarkable on this noncontrast CT. Mediastinum/Nodes: No hilar or mediastinal adenopathy. Small hiatal hernia. The esophagus is grossly unremarkable. Asymmetric enlargement of the left thyroid lobe with a 3.5 cm nodule. Recommend thyroid US (ref:  J Am Coll Radiol. 2015 Feb;12(2): 143-50).No mediastinal fluid collection. Lungs/Pleura: No focal consolidation, pleural effusion, or pneumothorax. The central airways are patent. Musculoskeletal: Osteopenia with degenerative changes of the spine. Mildly displaced fractures of the posterior right 10th-12th ribs. CT ABDOMEN PELVIS FINDINGS No intra-abdominal free air or free fluid. Hepatobiliary: The liver is unremarkable. No biliary dilatation. The gallbladder is unremarkable. Pancreas: Unremarkable. No pancreatic ductal dilatation or surrounding inflammatory changes. Spleen: Normal in size  without focal abnormality. Adrenals/Urinary Tract: The adrenal glands are unremarkable. The kidneys, and the visualized ureters appear unremarkable. There is trabeculated appearance of the bladder wall suggestive of chronic bladder outlet obstruction. Stomach/Bowel: Moderate stool throughout the colon. There is sigmoid diverticulosis without active inflammatory changes. There is no bowel obstruction or active inflammation. The appendix is normal. Vascular/Lymphatic: Moderate aortoiliac atherosclerotic disease. The IVC is unremarkable. No portal venous gas. There is no adenopathy. Reproductive: Enlarged prostate gland measuring 5.7 cm in transverse axial diameter. The seminal vesicles are symmetric. Other: Small fat containing umbilical hernia. Musculoskeletal: Osteopenia with degenerative changes of the spine. No acute osseous pathology. Indeterminate 3.4 x 2.1 cm low attenuating, possibly cystic nodule in the subcutaneous soft tissues of the left anterior pelvic wall may represent a sebaceous cyst. Further evaluation with ultrasound on a nonemergent/outpatient basis recommended. IMPRESSION: 1. Mildly displaced fractures of the posterior right 10th-12th ribs. No pneumothorax. 2. No acute/traumatic intra-abdominal or pelvic pathology. 3. Sigmoid diverticulosis. No bowel obstruction. Normal appendix. 4. 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 5.  Aortic Atherosclerosis (ICD10-I70.0). Electronically Signed   By: Elgie Collard M.D.   On: 09/04/2022 18:29   CT CHEST WO CONTRAST  Result Date: 09/04/2022 CLINICAL DATA:  Trauma. EXAM: CT CHEST, ABDOMEN AND PELVIS WITHOUT CONTRAST TECHNIQUE: Multidetector CT imaging of the chest, abdomen and pelvis was performed following the standard protocol without IV contrast. RADIATION DOSE REDUCTION: This exam was performed according to the departmental dose-optimization program which includes automated exposure control, adjustment of the mA  and/or kV according to patient size and/or use of iterative reconstruction technique. COMPARISON:  None Available. FINDINGS: Evaluation of this exam is limited in the absence of intravenous contrast. CT CHEST FINDINGS Cardiovascular: There is no cardiomegaly or pericardial effusion. There is 3 vessel coronary vascular calcification. Mild atherosclerotic calcification of the thoracic aorta. No aneurysmal dilatation. The central pulmonary arteries are grossly unremarkable on this noncontrast CT. Mediastinum/Nodes: No hilar or mediastinal adenopathy. Small hiatal hernia. The esophagus is grossly unremarkable. Asymmetric enlargement of the left thyroid lobe with a 3.5 cm nodule. Recommend thyroid US (ref: J Am Coll Radiol. 2015 Feb;12(2): 143-50).No mediastinal fluid collection. Lungs/Pleura: No focal consolidation, pleural effusion, or pneumothorax. The central airways are patent. Musculoskeletal: Osteopenia with degenerative changes of the spine. Mildly displaced fractures of the posterior right 10th-12th ribs. CT ABDOMEN PELVIS FINDINGS No intra-abdominal free air or free fluid. Hepatobiliary: The liver is unremarkable. No biliary dilatation. The gallbladder is unremarkable. Pancreas: Unremarkable. No pancreatic ductal dilatation or surrounding inflammatory changes. Spleen: Normal in size without focal abnormality. Adrenals/Urinary Tract: The adrenal glands are unremarkable. The kidneys, and the visualized ureters appear unremarkable. There is trabeculated appearance of the bladder wall suggestive of chronic bladder outlet obstruction. Stomach/Bowel: Moderate stool throughout the colon. There is sigmoid diverticulosis without active inflammatory changes. There is no bowel obstruction or active inflammation. The appendix is normal. Vascular/Lymphatic: Moderate aortoiliac atherosclerotic disease. The IVC is unremarkable. No portal venous gas. There is no adenopathy. Reproductive: Enlarged prostate gland measuring 5.7  cm in transverse axial diameter. The seminal vesicles are symmetric. Other: Small fat containing umbilical hernia. Musculoskeletal: Osteopenia with degenerative changes of the spine. No acute osseous pathology. Indeterminate 3.4 x 2.1 cm low attenuating, possibly cystic nodule in the subcutaneous soft tissues of the left anterior pelvic wall may represent a sebaceous cyst. Further evaluation with ultrasound on a nonemergent/outpatient basis recommended. IMPRESSION: 1. Mildly displaced fractures of the posterior right 10th-12th ribs. No pneumothorax. 2. No acute/traumatic intra-abdominal or pelvic pathology. 3. Sigmoid diverticulosis. No bowel obstruction. Normal appendix. 4. 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 5.  Aortic Atherosclerosis (ICD10-I70.0). Electronically Signed   By: Elgie Collard M.D.   On: 09/04/2022 18:29   CT Head Wo Contrast  Result Date: 09/04/2022 CLINICAL DATA:  Head trauma, minor (Age >= 65y), fall EXAM: CT HEAD WITHOUT CONTRAST TECHNIQUE: Contiguous axial images were obtained from the base of the skull through the vertex without intravenous contrast. RADIATION DOSE REDUCTION: This exam was performed according to the departmental dose-optimization program which includes automated exposure control, adjustment of the mA and/or kV according to patient size and/or use of iterative reconstruction technique. COMPARISON:  None Available. FINDINGS: Brain: Diffuse cerebral atrophy. No acute intracranial abnormality. Specifically, no hemorrhage, hydrocephalus, mass lesion, acute infarction, or significant intracranial injury. Vascular: No hyperdense vessel or unexpected calcification. Skull: No acute calvarial abnormality. Sinuses/Orbits: Mucosal thickening throughout the right paranasal sinuses. No air-fluid levels. Other: None IMPRESSION: Diffuse cerebral atrophy.  No acute intracranial abnormality. Right chronic sinusitis. Electronically Signed   By:  Charlett Nose M.D.   On: 09/04/2022 18:15   DG Ribs Unilateral W/Chest Right  Result Date: 09/04/2022 CLINICAL DATA:  fall EXAM: RIGHT RIBS AND CHEST - 3+ VIEW COMPARISON:  September 11, 2006 FINDINGS: There is a minimally displaced fracture of the RIGHT tenth and eleventh ribs. Nondisplaced fracture of the RIGHT twelfth rib. No pneumothorax or pleural effusion. Atherosclerotic calcifications of the aorta. IMPRESSION: Minimally displaced fractures of the RIGHT tenth and eleventh ribs. Nondisplaced fracture of the RIGHT twelfth rib. Electronically Signed   By: Meda Klinefelter M.D.   On: 09/04/2022 18:03      Assessment/Plan 77 yo male s/p ground-level fall with multiple rib fractures. - Pulmonary toilet: incentive spirometry, flutter valve - Multimodal pain control - PT/OT evaluation in am - Remainder of care per primary team ***  Sophronia Simas, MD [facility] Surgery General, Hepatobiliary and Pancreatic Surgery 09/04/22 8:42 PM

## 2022-09-04 NOTE — H&P (Signed)
History and Physical    Patient: Tony [name] ZOX:[id number] DOB: [birth year] DOA: 09/04/2022 DOS: the patient was seen and examined on 09/04/2022 PCP: Chilton Greathouse, MD  Patient coming from: Home  Chief Complaint:  Chief Complaint  Patient presents with   Fall   HPI: Tony [name] is a 77 y.o. male with medical history significant for Parkinson's disease with associated dementia and unsteady gait who fell at home today.  The patient is not able to provide any history.  He does not recall the details.   The ER evaluation revealed that he has 3 rib fractures on the right.  Ribs 10,11, and 12.  He did require IV morphine in the emergency department and the patient's family feels that he will not be able to manage at home.  So he will be admitted for pain control and early mobilization as he is at high risk for deterioration.  Review of Systems: unable to review all systems due to the inability of the patient to answer questions. Past Medical History:  Diagnosis Date   Anxiety    Boil of buttock    High cholesterol    Imbalance    Low back pain    Parkinsons    Past Surgical History:  Procedure Laterality Date   CYST REMOVAL LEG  2014   Social History:  reports that he has never smoked. He uses smokeless tobacco. He reports current alcohol use. He reports that he does not use drugs.  Allergies  Allergen Reactions   Shellfish-Derived Products Hives and Swelling    Family History  Problem Relation Age of Onset   Stroke Mother    Heart disease Father    Heart attack Paternal Grandfather    Sleep apnea Neg Hx     Prior to Admission medications   Medication Sig Start Date End Date Taking? Authorizing Provider  atorvastatin (LIPITOR) 20 MG tablet Take 20 mg by mouth every evening.   Yes [provider]  carbidopa-levodopa (SINEMET IR) 25-100 MG tablet Take 2 tablets by mouth 3 (three) times daily. Patient taking differently: Take 2 tablets by mouth See admin  instructions. Take 2 tablets by mouth at 8 AM, 2 PM, and 8 PM 05/16/22  Yes Glean Salvo, NP  cetirizine (ZYRTEC) 10 MG tablet Take 10 mg by mouth daily.   Yes [provider]  donepezil (ARICEPT) 10 MG tablet Take 1 tablet (10 mg total) by mouth in the morning. 05/16/22  Yes Glean Salvo, NP  escitalopram (LEXAPRO) 20 MG tablet Take 20 mg by mouth daily.   Yes [provider]  Multiple Vitamins-Minerals (CERTAVITE SENIOR/ANTIOXIDANT) TABS Take 1 tablet by mouth daily with breakfast.   Yes [provider]  traZODone (DESYREL) 50 MG tablet Take 50 mg by mouth at bedtime. 09/24/21  Yes [provider]  citalopram (CELEXA) 10 MG tablet Take 2 tablets (20 mg total) by mouth daily. Patient not taking: Reported on 09/04/2022 05/16/22   Glean Salvo, NP    Physical Exam: Vitals:   09/04/22 1730 09/04/22 1845 09/04/22 2009 09/04/22 2203  BP: (!) 160/77 (!) 160/77  124/72  Pulse: 76 76  75  Resp:  18  16  Temp:   97.7 F (36.5 C) 98.6 F (37 C)  TempSrc:   Oral Oral  SpO2: 100% 100%  98%  Weight:      Height:       Physical Exam:  General: No acute distress, well developed, well nourished HEENT: Normocephalic,  atraumatic, PER surgically iregular Cardiovascular: Normal rate and rhythm. Distal pulses intact. Pulmonary: Normal pulmonary effort, normal breath sounds Gastrointestinal: Nondistended abdomen, soft, non-tender, normoactive bowel sounds, no organomegaly Musculoskeletal:Normal ROM, no lower ext edema Lymphadenopathy: No cervical LAD. Skin: Skin is warm and dry. Neuro: No focal deficits noted,  PSYCH: Attentive and cooperative  Data Reviewed:  Results for orders placed or performed during the hospital encounter of 09/04/22 (from the past 24 hour(s))  I-stat chem 8, ED (not at [facility], [facility] or [facility])     Status: Abnormal   Collection Time: 09/04/22  6:26 PM  Result Value Ref Range   Sodium 139 135 - 145 mmol/L   Potassium 3.9 3.5 - 5.1 mmol/L    Chloride 103 98 - 111 mmol/L   BUN 13 8 - 23 mg/dL   Creatinine, Ser 5.62 0.61 - 1.24 mg/dL   Glucose, Bld 130 (H) 70 - 99 mg/dL   Calcium, Ion 8.65 7.84 - 1.40 mmol/L   TCO2 25 22 - 32 mmol/L   Hemoglobin 11.6 (L) 13.0 - 17.0 g/dL   HCT 69.6 (L) 29.5 - 28.4 %  CBC with Differential     Status: Abnormal   Collection Time: 09/04/22  8:00 PM  Result Value Ref Range   WBC 9.7 4.0 - 10.5 K/uL   RBC 4.93 4.22 - 5.81 MIL/uL   Hemoglobin 10.3 (L) 13.0 - 17.0 g/dL   HCT 13.2 (L) 44.0 - 10.2 %   MCV 64.9 (L) 80.0 - 100.0 fL   MCH 20.9 (L) 26.0 - 34.0 pg   MCHC 32.2 30.0 - 36.0 g/dL   RDW 72.5 36.6 - 44.0 %   Platelets 194 150 - 400 K/uL   nRBC 0.0 0.0 - 0.2 %   Neutrophils Relative % 75 %   Neutro Abs 7.3 1.7 - 7.7 K/uL   Lymphocytes Relative 17 %   Lymphs Abs 1.7 0.7 - 4.0 K/uL   Monocytes Relative 7 %   Monocytes Absolute 0.7 0.1 - 1.0 K/uL   Eosinophils Relative 1 %   Eosinophils Absolute 0.1 0.0 - 0.5 K/uL   Basophils Relative 0 %   Basophils Absolute 0.0 0.0 - 0.1 K/uL   Immature Granulocytes 0 %   Abs Immature Granulocytes 0.03 0.00 - 0.07 K/uL   Acanthocytes PRESENT    Ovalocytes PRESENT      Assessment and Plan: 3 rib fractures - Pain control, pulmonary toilet, early mobilization Parkinson's disease - continue routine medication    Advance Care Planning:   Code Status: Full Code  The patient does not have the capacity to make CODE STATUS decisions so he will be full code.  Consults: gen surgery  Family Communication: none  Severity of Illness: The appropriate patient status for this patient is INPATIENT. Inpatient status is judged to be reasonable and necessary in order to provide the required intensity of service to ensure the patient's safety. The patient's presenting symptoms, physical exam findings, and initial radiographic and laboratory data in the context of their chronic comorbidities is felt to place them at high risk for further clinical deterioration.  Furthermore, it is not anticipated that the patient will be medically stable for discharge from the hospital within 2 midnights of admission.   * I certify that at the point of admission it is my clinical judgment that the patient will require inpatient hospital care spanning beyond 2 midnights from the point of admission due to high intensity of service, high risk for further deterioration and  high frequency of surveillance required.*  Author: Buena Irish, MD 09/04/2022 10:44 PM  For on call review [email or web address].

## 2022-09-04 NOTE — ED Notes (Signed)
ED TO INPATIENT HANDOFF REPORT  ED Nurse Name and Phone #: Matilde Bash [phone]  S Name/Age/Gender Tony [name] 77 y.o. male Room/Bed: WA16/WA16  Code Status   Code Status: Full Code  Home/SNF/Other Nursing Home Patient oriented to: self, place, time, and situation Is this baseline? Yes   Triage Complete: Triage complete  Chief Complaint Multiple rib fractures [S22.49XA]  Triage Note Patient BIB [facility] from [facility]. Fell onto his right flank/upper buttocks on the corner of countertop after losing his balance after opening a door. 2 inch scrape. No LOC. Painful to take a deep breath.   Allergies Allergies  Allergen Reactions   Shellfish-Derived Products Hives and Swelling    Level of Care/Admitting Diagnosis ED Disposition     ED Disposition  Admit   Condition  --   Comment  Hospital Area: [facility] [[id number]]  Level of Care: Med-Surg [16]  May admit patient to [facility] or [facility] if equivalent level of care is available:: Yes  Covid Evaluation: Asymptomatic - no recent exposure (last 10 days) testing not required  Diagnosis: Multiple rib fractures [440102]  Admitting Physician: Buena Irish [[id number]]  Attending Physician: Buena Irish [phone]  Certification:: I certify this patient will need inpatient services for at least 2 midnights  Estimated Length of Stay: 2          B Medical/Surgery History Past Medical History:  Diagnosis Date   Anxiety    Boil of buttock    High cholesterol    Imbalance    Low back pain    Parkinsons    Past Surgical History:  Procedure Laterality Date   CYST REMOVAL LEG  2014     A IV Location/Drains/Wounds Patient Lines/Drains/Airways Status     Active Line/Drains/Airways     Name Placement date Placement time Site Days   Peripheral IV 09/04/22 20 G Right Antecubital 09/04/22  1959  Antecubital  less than 1            Intake/Output Last 24 hours No intake  or output data in the 24 hours ending 09/04/22 2105  Labs/Imaging Results for orders placed or performed during the hospital encounter of 09/04/22 (from the past 48 hour(s))  I-stat chem 8, ED (not at [facility], [facility] or [facility])     Status: Abnormal   Collection Time: 09/04/22  6:26 PM  Result Value Ref Range   Sodium 139 135 - 145 mmol/L   Potassium 3.9 3.5 - 5.1 mmol/L   Chloride 103 98 - 111 mmol/L   BUN 13 8 - 23 mg/dL   Creatinine, Ser 6.64 0.61 - 1.24 mg/dL   Glucose, Bld 403 (H) 70 - 99 mg/dL    Comment: Glucose reference range applies only to samples taken after fasting for at least 8 hours.   Calcium, Ion 1.19 1.15 - 1.40 mmol/L   TCO2 25 22 - 32 mmol/L   Hemoglobin 11.6 (L) 13.0 - 17.0 g/dL   HCT 47.4 (L) 25.9 - 56.3 %  CBC with Differential     Status: Abnormal   Collection Time: 09/04/22  8:00 PM  Result Value Ref Range   WBC 9.7 4.0 - 10.5 K/uL   RBC 4.93 4.22 - 5.81 MIL/uL   Hemoglobin 10.3 (L) 13.0 - 17.0 g/dL   HCT 87.5 (L) 64.3 - 32.9 %   MCV 64.9 (L) 80.0 - 100.0 fL   MCH 20.9 (L) 26.0 - 34.0 pg   MCHC 32.2 30.0 - 36.0 g/dL  RDW 15.2 11.5 - 15.5 %   Platelets 194 150 - 400 K/uL   nRBC 0.0 0.0 - 0.2 %   Neutrophils Relative % 75 %   Neutro Abs 7.3 1.7 - 7.7 K/uL   Lymphocytes Relative 17 %   Lymphs Abs 1.7 0.7 - 4.0 K/uL   Monocytes Relative 7 %   Monocytes Absolute 0.7 0.1 - 1.0 K/uL   Eosinophils Relative 1 %   Eosinophils Absolute 0.1 0.0 - 0.5 K/uL   Basophils Relative 0 %   Basophils Absolute 0.0 0.0 - 0.1 K/uL   Immature Granulocytes 0 %   Abs Immature Granulocytes 0.03 0.00 - 0.07 K/uL   Acanthocytes PRESENT    Ovalocytes PRESENT     Comment: Performed at [facility], [address], [place], [place] [address]   CT ABDOMEN PELVIS WO CONTRAST  Result Date: 09/04/2022 CLINICAL DATA:  Trauma. EXAM: CT CHEST, ABDOMEN AND PELVIS WITHOUT CONTRAST TECHNIQUE: Multidetector CT imaging of the chest, abdomen and pelvis was performed following  the standard protocol without IV contrast. RADIATION DOSE REDUCTION: This exam was performed according to the departmental dose-optimization program which includes automated exposure control, adjustment of the mA and/or kV according to patient size and/or use of iterative reconstruction technique. COMPARISON:  None Available. FINDINGS: Evaluation of this exam is limited in the absence of intravenous contrast. CT CHEST FINDINGS Cardiovascular: There is no cardiomegaly or pericardial effusion. There is 3 vessel coronary vascular calcification. Mild atherosclerotic calcification of the thoracic aorta. No aneurysmal dilatation. The central pulmonary arteries are grossly unremarkable on this noncontrast CT. Mediastinum/Nodes: No hilar or mediastinal adenopathy. Small hiatal hernia. The esophagus is grossly unremarkable. Asymmetric enlargement of the left thyroid lobe with a 3.5 cm nodule. Recommend thyroid US (ref: J Am Coll Radiol. 2015 Feb;12(2): 143-50).No mediastinal fluid collection. Lungs/Pleura: No focal consolidation, pleural effusion, or pneumothorax. The central airways are patent. Musculoskeletal: Osteopenia with degenerative changes of the spine. Mildly displaced fractures of the posterior right 10th-12th ribs. CT ABDOMEN PELVIS FINDINGS No intra-abdominal free air or free fluid. Hepatobiliary: The liver is unremarkable. No biliary dilatation. The gallbladder is unremarkable. Pancreas: Unremarkable. No pancreatic ductal dilatation or surrounding inflammatory changes. Spleen: Normal in size without focal abnormality. Adrenals/Urinary Tract: The adrenal glands are unremarkable. The kidneys, and the visualized ureters appear unremarkable. There is trabeculated appearance of the bladder wall suggestive of chronic bladder outlet obstruction. Stomach/Bowel: Moderate stool throughout the colon. There is sigmoid diverticulosis without active inflammatory changes. There is no bowel obstruction or active inflammation.  The appendix is normal. Vascular/Lymphatic: Moderate aortoiliac atherosclerotic disease. The IVC is unremarkable. No portal venous gas. There is no adenopathy. Reproductive: Enlarged prostate gland measuring 5.7 cm in transverse axial diameter. The seminal vesicles are symmetric. Other: Small fat containing umbilical hernia. Musculoskeletal: Osteopenia with degenerative changes of the spine. No acute osseous pathology. Indeterminate 3.4 x 2.1 cm low attenuating, possibly cystic nodule in the subcutaneous soft tissues of the left anterior pelvic wall may represent a sebaceous cyst. Further evaluation with ultrasound on a nonemergent/outpatient basis recommended. IMPRESSION: 1. Mildly displaced fractures of the posterior right 10th-12th ribs. No pneumothorax. 2. No acute/traumatic intra-abdominal or pelvic pathology. 3. Sigmoid diverticulosis. No bowel obstruction. Normal appendix. 4. 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 5.  Aortic Atherosclerosis (ICD10-I70.0). Electronically Signed   By: Elgie Collard M.D.   On: 09/04/2022 18:29   CT CHEST WO CONTRAST  Result Date: 09/04/2022 CLINICAL DATA:  Trauma. EXAM: CT  CHEST, ABDOMEN AND PELVIS WITHOUT CONTRAST TECHNIQUE: Multidetector CT imaging of the chest, abdomen and pelvis was performed following the standard protocol without IV contrast. RADIATION DOSE REDUCTION: This exam was performed according to the departmental dose-optimization program which includes automated exposure control, adjustment of the mA and/or kV according to patient size and/or use of iterative reconstruction technique. COMPARISON:  None Available. FINDINGS: Evaluation of this exam is limited in the absence of intravenous contrast. CT CHEST FINDINGS Cardiovascular: There is no cardiomegaly or pericardial effusion. There is 3 vessel coronary vascular calcification. Mild atherosclerotic calcification of the thoracic aorta. No aneurysmal dilatation. The  central pulmonary arteries are grossly unremarkable on this noncontrast CT. Mediastinum/Nodes: No hilar or mediastinal adenopathy. Small hiatal hernia. The esophagus is grossly unremarkable. Asymmetric enlargement of the left thyroid lobe with a 3.5 cm nodule. Recommend thyroid US (ref: J Am Coll Radiol. 2015 Feb;12(2): 143-50).No mediastinal fluid collection. Lungs/Pleura: No focal consolidation, pleural effusion, or pneumothorax. The central airways are patent. Musculoskeletal: Osteopenia with degenerative changes of the spine. Mildly displaced fractures of the posterior right 10th-12th ribs. CT ABDOMEN PELVIS FINDINGS No intra-abdominal free air or free fluid. Hepatobiliary: The liver is unremarkable. No biliary dilatation. The gallbladder is unremarkable. Pancreas: Unremarkable. No pancreatic ductal dilatation or surrounding inflammatory changes. Spleen: Normal in size without focal abnormality. Adrenals/Urinary Tract: The adrenal glands are unremarkable. The kidneys, and the visualized ureters appear unremarkable. There is trabeculated appearance of the bladder wall suggestive of chronic bladder outlet obstruction. Stomach/Bowel: Moderate stool throughout the colon. There is sigmoid diverticulosis without active inflammatory changes. There is no bowel obstruction or active inflammation. The appendix is normal. Vascular/Lymphatic: Moderate aortoiliac atherosclerotic disease. The IVC is unremarkable. No portal venous gas. There is no adenopathy. Reproductive: Enlarged prostate gland measuring 5.7 cm in transverse axial diameter. The seminal vesicles are symmetric. Other: Small fat containing umbilical hernia. Musculoskeletal: Osteopenia with degenerative changes of the spine. No acute osseous pathology. Indeterminate 3.4 x 2.1 cm low attenuating, possibly cystic nodule in the subcutaneous soft tissues of the left anterior pelvic wall may represent a sebaceous cyst. Further evaluation with ultrasound on a  nonemergent/outpatient basis recommended. IMPRESSION: 1. Mildly displaced fractures of the posterior right 10th-12th ribs. No pneumothorax. 2. No acute/traumatic intra-abdominal or pelvic pathology. 3. Sigmoid diverticulosis. No bowel obstruction. Normal appendix. 4. 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 5.  Aortic Atherosclerosis (ICD10-I70.0). Electronically Signed   By: Elgie Collard M.D.   On: 09/04/2022 18:29   CT Head Wo Contrast  Result Date: 09/04/2022 CLINICAL DATA:  Head trauma, minor (Age >= 65y), fall EXAM: CT HEAD WITHOUT CONTRAST TECHNIQUE: Contiguous axial images were obtained from the base of the skull through the vertex without intravenous contrast. RADIATION DOSE REDUCTION: This exam was performed according to the departmental dose-optimization program which includes automated exposure control, adjustment of the mA and/or kV according to patient size and/or use of iterative reconstruction technique. COMPARISON:  None Available. FINDINGS: Brain: Diffuse cerebral atrophy. No acute intracranial abnormality. Specifically, no hemorrhage, hydrocephalus, mass lesion, acute infarction, or significant intracranial injury. Vascular: No hyperdense vessel or unexpected calcification. Skull: No acute calvarial abnormality. Sinuses/Orbits: Mucosal thickening throughout the right paranasal sinuses. No air-fluid levels. Other: None IMPRESSION: Diffuse cerebral atrophy.  No acute intracranial abnormality. Right chronic sinusitis. Electronically Signed   By: Charlett Nose M.D.   On: 09/04/2022 18:15   DG Ribs Unilateral W/Chest Right  Result Date: 09/04/2022 CLINICAL DATA:  fall EXAM: RIGHT RIBS AND CHEST -  3+ VIEW COMPARISON:  September 11, 2006 FINDINGS: There is a minimally displaced fracture of the RIGHT tenth and eleventh ribs. Nondisplaced fracture of the RIGHT twelfth rib. No pneumothorax or pleural effusion. Atherosclerotic calcifications of the aorta. IMPRESSION:  Minimally displaced fractures of the RIGHT tenth and eleventh ribs. Nondisplaced fracture of the RIGHT twelfth rib. Electronically Signed   By: Meda Klinefelter M.D.   On: 09/04/2022 18:03    Pending Labs Unresulted Labs (From admission, onward)     Start     Ordered   09/05/22 0500  Comprehensive metabolic panel  Tomorrow morning,   R        09/04/22 2045            Vitals/Pain Today's Vitals   09/04/22 1845 09/04/22 1914 09/04/22 2009 09/04/22 2031  BP: (!) 160/77     Pulse: 76     Resp: 18     Temp:   97.7 F (36.5 C)   TempSrc:   Oral   SpO2: 100%     Weight:      Height:      PainSc:  8   5     Isolation Precautions No active isolations  Medications Medications  morphine (PF) 4 MG/ML injection 4 mg (4 mg Intravenous Given 09/04/22 2001)  guaiFENesin (MUCINEX) 12 hr tablet 600 mg (has no administration in time range)  acetaminophen (TYLENOL) tablet 1,000 mg (1,000 mg Oral Given 09/04/22 2100)  ibuprofen (ADVIL) tablet 400 mg (400 mg Oral Given 09/04/22 2100)  methocarbamol (ROBAXIN) tablet 500 mg (500 mg Oral Given 09/04/22 2100)  oxyCODONE (Oxy IR/ROXICODONE) immediate release tablet 2.5-5 mg (has no administration in time range)  heparin injection 5,000 Units (has no administration in time range)  ondansetron (ZOFRAN) tablet 4 mg (has no administration in time range)    Or  ondansetron (ZOFRAN) injection 4 mg (has no administration in time range)  melatonin tablet 3 mg (has no administration in time range)  acetaminophen (TYLENOL) tablet 650 mg (650 mg Oral Given 09/04/22 1728)    Mobility non-ambulatory     Focused Assessments    R Recommendations: See Admitting Provider Note  Report given to:   Additional Notes:

## 2022-09-05 ENCOUNTER — Inpatient Hospital Stay (HOSPITAL_COMMUNITY): Payer: PPO

## 2022-09-05 DIAGNOSIS — S2241XA Multiple fractures of ribs, right side, initial encounter for closed fracture: Secondary | ICD-10-CM | POA: Diagnosis not present

## 2022-09-05 LAB — COMPREHENSIVE METABOLIC PANEL
ALT: 20 U/L (ref 0–44)
AST: 12 U/L — ABNORMAL LOW (ref 15–41)
Albumin: 3.6 g/dL (ref 3.5–5.0)
Alkaline Phosphatase: 70 U/L (ref 38–126)
Anion gap: 6 (ref 5–15)
BUN: 17 mg/dL (ref 8–23)
CO2: 27 mmol/L (ref 22–32)
Calcium: 8.9 mg/dL (ref 8.9–10.3)
Chloride: 104 mmol/L (ref 98–111)
Creatinine, Ser: 1.05 mg/dL (ref 0.61–1.24)
GFR, Estimated: >60 mL/min (ref >60)
Glucose, Bld: 98 mg/dL (ref 70–99)
Potassium: 3.8 mmol/L (ref 3.5–5.1)
Sodium: 137 mmol/L (ref 135–145)
Total Bilirubin: 0.9 mg/dL (ref 0.3–1.2)
Total Protein: 6.7 g/dL (ref 6.5–8.1)

## 2022-09-05 MED ORDER — LIDOCAINE 5 % EX PTCH
3.0000 | MEDICATED_PATCH | CUTANEOUS | Status: DC
  Administered 2022-09-05: 1 via TRANSDERMAL
  Administered 2022-09-06: 2 via TRANSDERMAL
  Administered 2022-09-07 – 2022-09-09 (×3): 3 via TRANSDERMAL
  Filled 2022-09-05 (×6): qty 3

## 2022-09-05 MED ORDER — LORATADINE 10 MG PO TABS
10.0000 mg | ORAL_TABLET | Freq: Every day | ORAL | Status: DC
  Administered 2022-09-05 – 2022-09-09 (×5): 10 mg via ORAL
  Filled 2022-09-05 (×5): qty 1

## 2022-09-05 NOTE — TOC PASRR Note (Signed)
30 Day PASRR Note   Patient Details  Name: Tony [name] Date of Birth: 1945/09/12   Transition [facility]) CM/SW Contact:    Amada Jupiter, LCSW Phone Number: 09/05/2022, 3:04 PM  To Whom It May Concern:  Please be advised that this patient will require a short-term nursing home stay - anticipated 30 days or less for rehabilitation and strengthening.   The plan is for return home.

## 2022-09-05 NOTE — Evaluation (Signed)
Physical Therapy Evaluation Patient Details Name: Tony [name] MRN: [id number] DOB: 1945-12-28 Today's Date: 09/05/2022  History of Present Illness  Tony [name] is a 77 y.o. male with medical history significant for Parkinson's disease with associated dementia and unsteady gait who fell at home 7/10.  He was found to have 3 rib fractures on RT side 10th-12th, mildly displaced.  Clinical Impression    Current level of function is below patient's typical baseline, however family does endorse pt is assisted with most ADLs and supervised when ambulating in hallway at his [name].  During this evaluation, patient was limited by neurological symptoms including LT UE inattention, RT peripheral vision Impairments, posterior bias with balance, cognitive deficit with short term memory loss, and pain to RT rib area, all of which has the potential to impact patient's safety and independence during functional mobility, as well as performance for ADLs.  Patient lives at [name], with supportive family who supervise pt ad lib, but unable to provide close 24/7 supervision.   Patient demonstrates fair rehab potential, and should benefit from continued skilled PT services while in acute care to maximize safety, independence and quality of life at home.  Continued PT services are recommended.    Assistance Recommended at Discharge Frequent or constant Supervision/Assistance  If plan is discharge home, recommend the following:  Can travel by private vehicle  A little help with walking and/or transfers;A little help with bathing/dressing/bathroom;Assistance with cooking/housework;Assist for transportation   No    Equipment Recommendations None recommended by PT  Recommendations for Other Services       Functional Status Assessment Patient has had a recent decline in their functional status and demonstrates the ability to make significant improvements in function in a reasonable and predictable amount of  time.     Precautions / Restrictions Precautions Precautions: Fall Restrictions Weight Bearing Restrictions: No      Mobility  Bed Mobility Overal bed mobility: Needs Assistance Bed Mobility: Rolling, Supine to Sit Rolling: Min assist, Independent   Supine to sit: Mod assist     General bed mobility comments: Min Assist to light moderate assist needed as pt with decreased motor planning and not following instructions.    Transfers Overall transfer level: Needs assistance Equipment used: Rolling walker (2 wheels) Transfers: Sit to/from Stand Sit to Stand: Min assist           General transfer comment: To bring wt up and fwd and to balance in intial standing with RW    Ambulation/Gait Ambulation/Gait assistance: Min assist Gait Distance (Feet): 164 Feet Assistive device: Rolling walker (2 wheels) Gait Pattern/deviations: Step-to pattern, Step-through pattern, Decreased step length - right, Decreased step length - left, Shuffle, Trunk flexed       General Gait Details: cues for posture, position from RW and assist for balance and RW management  Stairs            Wheelchair Mobility     Tilt Bed    Modified Rankin (Stroke Patients Only)       Balance Overall balance assessment: History of Falls, Needs assistance   Sitting balance-Leahy Scale: Fair     Standing balance support: Reliant on assistive device for balance, During functional activity, Bilateral upper extremity supported Standing balance-Leahy Scale: Poor                               Pertinent Vitals/Pain Pain Assessment Pain Assessment: 0-10 Pain Score: 7  Pain Location: RT ribs Pain Descriptors / Indicators: Sharp Pain Intervention(s): Limited activity within patient's tolerance, Monitored during session, Premedicated before session, Repositioned    Home Living Family/patient expects to be discharged to:: Assisted living                 Home Equipment:  Rollator (4 wheels);Rolling Walker (2 wheels);Transport chair      Prior Function Prior Level of Function : History of Falls (last six months);Needs assist       Physical Assist : ADLs (physical)   ADLs (physical): IADLs;Toileting;Dressing;Bathing Mobility Comments: Ambulates with Rollator to dining room, often accompanies by spouse or daughter. Uses Rollator inside his apartment. ADLs Comments: Wear Depends at baseline. Controls bowel but not bladder. Assisted with toileting, bathing and dressing.     Hand Dominance   Dominant Hand: Right    Extremity/Trunk Assessment   Upper Extremity Assessment Upper Extremity Assessment: RUE deficits/detail RUE Deficits / Details: WFL but decreasedideomotor with inability to mirror OT making "okay" sign with digits. RUE Coordination: decreased fine motor LUE Deficits / Details: Inattention to LT UE. ROM WFL but pt does better moving BUEs or RT only. Increased difficulty to move LT UE when RUE is restrained in pt's lap. LUE Sensation: decreased proprioception LUE Coordination: decreased gross motor;decreased fine motor    Lower Extremity Assessment Lower Extremity Assessment: Generalized weakness    Cervical / Trunk Assessment Cervical / Trunk Assessment: Other exceptions Cervical / Trunk Exceptions: Posterior bias. Pt confirms he tends to fall or backwards.  Communication   Communication: No difficulties  Cognition Arousal/Alertness: Awake/alert Behavior During Therapy: Impulsive, Flat affect, Restless Overall Cognitive Status: History of cognitive impairments - at baseline Area of Impairment: Memory, Following commands                     Memory: Decreased short-term memory Following Commands: Follows one step commands with increased time                General Comments      Exercises     Assessment/Plan    PT Assessment Patient does not need any further PT services  PT Problem List         PT Treatment  Interventions      PT Goals (Current goals can be found in the Care Plan section)  Acute Rehab PT Goals Patient Stated Goal: Regain IND PT Goal Formulation: With patient Time For Goal Achievement: 09/19/22 Potential to Achieve Goals: Good    Frequency       Co-evaluation PT/OT/SLP Co-Evaluation/Treatment: Yes Reason for Co-Treatment: For patient/therapist safety PT goals addressed during session: Mobility/safety with mobility OT goals addressed during session: Strengthening/ROM;ADL's and self-care       AM-PAC PT "6 Clicks" Mobility  Outcome Measure Help needed turning from your back to your side while in a flat bed without using bedrails?: A Little Help needed moving from lying on your back to sitting on the side of a flat bed without using bedrails?: A Lot Help needed moving to and from a bed to a chair (including a wheelchair)?: A Little Help needed standing up from a chair using your arms (e.g., wheelchair or bedside chair)?: A Little Help needed to walk in hospital room?: A Little Help needed climbing 3-5 steps with a railing? : A Lot 6 Click Score: 16    End of Session Equipment Utilized During Treatment: Gait belt Activity Tolerance: Patient tolerated treatment well Patient left: in chair;with call bell/phone  within reach;with chair alarm set;with family/visitor present Nurse Communication: Mobility status PT Visit Diagnosis: History of falling (Z91.81);Other symptoms and signs involving the nervous system (R29.898);Pain;Difficulty in walking, not elsewhere classified (R26.2);Muscle weakness (generalized) (M62.81) Pain - Right/Left: Right Pain - part of body:  (ribs 10 - 12)    Time: 4098-1191 PT Time Calculation (min) (ACUTE ONLY): 26 min   Charges:   PT Evaluation $PT Eval Low Complexity: 1 Low   PT General Charges $$ ACUTE PT VISIT: 1 Visit         Mauro Kaufmann PT Acute Rehabilitation Services Pager [phone] Office  [phone]   Peachie Barkalow 09/05/2022, 2:23 PM

## 2022-09-05 NOTE — TOC Initial Note (Signed)
Transition of Care [facility]) - Initial/Assessment Note    Patient Details  Name: Tony [name] MRN: [id number] Date of Birth: 08-07-1945  Transition of Care [facility]) CM/SW Contact:    Amada Jupiter, LCSW Phone Number: 09/05/2022, 11:27 AM  Clinical Narrative:                 Met with pt, wife and daughter today to introduce TOC role with dc planning needs.  They confirm that pt is currently a resident in the AL level apts at [facility] and had been managing pretty well until this fall.  He is, also, followed for outpatient therapy at [facility].  All are concerned about his limitations now due to rib fractures and anticipating need for SNF rehab prior to return to the ALF.  Have alerted MD and await PT eval (OT recommending SNF).  Continue to follow.  Expected Discharge Plan: Skilled Nursing Facility Barriers to Discharge: Continued Medical Work up, English as a second language teacher, SNF Pending bed offer   Patient Goals and CMS Choice Patient states their goals for this hospitalization and ongoing recovery are:: return to ALF following SNF rehab CMS Medicare.gov Compare Post Acute Care list provided to:: Patient Choice offered to / list presented to : Spouse, Adult Children [place] ownership interest in [facility].provided to:: Spouse    Expected Discharge Plan and Services In-house Referral: Clinical Social Work   Post Acute Care Choice: Skilled Nursing Facility Living arrangements for the past 2 months: Assisted Living Facility [facility])                 DME Arranged: N/A DME Agency: NA                  Prior Living Arrangements/Services Living arrangements for the past 2 months: Assisted Living Facility Agricultural consultant) Lives with:: Facility Resident Patient language and need for interpreter reviewed:: Yes Do you feel safe going back to the place where you live?: Yes      Need for Family Participation in Patient Care: No (Comment) Care giver support system in  place?: Yes (comment) Current home services:  (attends OP therapy for Parkinson rehab) Criminal Activity/Legal Involvement Pertinent to Current Situation/Hospitalization: No - Comment as needed  Activities of Daily Living Home Assistive Devices/Equipment: Wheelchair, Environmental consultant (specify type) ADL Screening (condition at time of admission) Patient's cognitive ability adequate to safely complete daily activities?: Yes Is the patient deaf or have difficulty hearing?: No Does the patient have difficulty seeing, even when wearing glasses/contacts?: No Does the patient have difficulty concentrating, remembering, or making decisions?: Yes Patient able to express need for assistance with ADLs?: Yes Does the patient have difficulty dressing or bathing?: Yes Independently performs ADLs?: No Communication: Independent Dressing (OT): Needs assistance Is this a change from baseline?: Pre-admission baseline Grooming: Needs assistance Is this a change from baseline?: Pre-admission baseline Feeding: Independent Bathing: Needs assistance Is this a change from baseline?: Pre-admission baseline Toileting: Needs assistance Is this a change from baseline?: Pre-admission baseline In/Out Bed: Needs assistance Is this a change from baseline?: Pre-admission baseline Walks in Home: Independent with device (comment) Does the patient have difficulty walking or climbing stairs?: Yes Weakness of Legs: Both Weakness of Arms/Hands: Left  Permission Sought/Granted Permission sought to share information with : Family Supports Permission granted to share information with : Yes, Verbal Permission Granted  Share Information with NAME: wife, Rykin Route @ [phone]           Emotional Assessment Appearance:: Appears  stated age Attitude/Demeanor/Rapport: Gracious Affect (typically observed): Accepting Orientation: : Oriented to Self, Oriented to Place, Oriented to  Time, Oriented to Situation Alcohol /  Substance Use: Not Applicable Psych Involvement: No (comment)  Admission diagnosis:  Multiple rib fractures [S22.49XA] Closed fracture of multiple ribs of right side, initial encounter [S22.41XA] Patient Active Problem List   Diagnosis Date Noted   Multiple rib fractures 09/04/2022   Intractable pain 09/04/2022   History of falling 11/29/2021   Unsteadiness on feet 11/29/2021   Excessive sleepiness 11/08/2021   Cognitive impairment 12/25/2020   Anxiety 04/25/2020   Gait abnormality 03/30/2020   Parkinson's disease 03/30/2020   Memory loss 03/30/2020   Parkinsonism 03/30/2020   CELLULITIS AND ABSCESS OF FACE 02/01/2008   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/2006   HYPERLIPIDEMIA 06/30/2006   DIVERTICULOSIS, COLON 06/30/2006   SEBORRHEIC KERATOSIS 06/30/2006   BACK PAIN, LUMBAR 06/30/2006   PCP:  Chilton Greathouse, MD Pharmacy:  No Pharmacies Listed    Social Determinants of Health (SDOH) Social History: SDOH Screenings   Food Insecurity: No Food Insecurity (09/04/2022)  Housing: Low Risk  (09/04/2022)  Transportation Needs: No Transportation Needs (09/04/2022)  Utilities: Not At Risk (09/04/2022)  Tobacco Use: High Risk (09/04/2022)   SDOH Interventions:     Readmission Risk Interventions    09/05/2022   11:23 AM  Readmission Risk Prevention Plan  Post Dischage Appt Complete  Medication Screening Complete  Transportation Screening Complete

## 2022-09-05 NOTE — NC FL2 (Cosign Needed Addendum)
[place] MEDICAID FL2 LEVEL OF CARE FORM     IDENTIFICATION  Patient Name: Tony [name] Birthdate: Nov 28, 1945 Sex: male Admission Date (Current Location): 09/04/2022  [facility] and [place] Number:  Producer, television/film/video and Address:  [facility],  [address] [place], [place] [address]      Provider Number: [id number]  Attending Physician Name and Address:  Zigmund Daniel., *  Relative Name and Phone Number:  wife, Berk Pilot @ [phone]    Current Level of Care: Hospital Recommended Level of Care: Skilled Nursing Facility Prior Approval Number:    Date Approved/Denied:   PASRR Number: [id number]  Discharge Plan: SNF    Current Diagnoses: Patient Active Problem List   Diagnosis Date Noted   Multiple rib fractures 09/04/2022   Intractable pain 09/04/2022   History of falling 11/29/2021   Unsteadiness on feet 11/29/2021   Excessive sleepiness 11/08/2021   Cognitive impairment 12/25/2020   Anxiety 04/25/2020   Gait abnormality 03/30/2020   Parkinson's disease 03/30/2020   Memory loss 03/30/2020   Parkinsonism 03/30/2020   CELLULITIS AND ABSCESS OF FACE 02/01/2008   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/2006   HYPERLIPIDEMIA 06/30/2006   DIVERTICULOSIS, COLON 06/30/2006   SEBORRHEIC KERATOSIS 06/30/2006   BACK PAIN, LUMBAR 06/30/2006    Orientation RESPIRATION BLADDER Height & Weight     Self, Time, Situation, Place  Normal Continent Weight: 202 lb (91.6 kg) Height:  6' (182.9 cm)  BEHAVIORAL SYMPTOMS/MOOD NEUROLOGICAL BOWEL NUTRITION STATUS      Continent Diet  AMBULATORY STATUS COMMUNICATION OF NEEDS Skin   Limited Assist Verbally Normal                       Personal Care Assistance Level of Assistance  Bathing, Feeding, Dressing Bathing Assistance: Limited assistance Feeding assistance: Limited assistance Dressing Assistance: Limited assistance     Functional Limitations Info  Sight, Hearing, Speech Sight  Info: Adequate Hearing Info: Adequate Speech Info: Adequate    SPECIAL CARE FACTORS FREQUENCY  PT (By licensed PT), OT (By licensed OT)     PT Frequency: 5x/wk OT Frequency: 5x/wk            Contractures Contractures Info: Not present    Additional Factors Info  Code Status, Allergies, Psychotropic Code Status Info: Full Allergies Info: shellfish derived products Psychotropic Info: see MAR         Current Medications (09/06/2022):  This is the current hospital active medication list Current Facility-Administered Medications  Medication Dose Route Frequency Provider Last Rate Last Admin   acetaminophen (TYLENOL) tablet 1,000 mg  1,000 mg Oral Q8H Sophronia Simas L, MD   1,000 mg at 09/06/22 0520   atorvastatin (LIPITOR) tablet 20 mg  20 mg Oral QPM Buena Irish, MD   20 mg at 09/05/22 1732   carbidopa-levodopa (SINEMET IR) 25-100 MG per tablet immediate release 2 tablet  2 tablet Oral 3 times per day Buena Irish, MD   2 tablet at 09/06/22 0921   donepezil (ARICEPT) tablet 10 mg  10 mg Oral Daily Buena Irish, MD   10 mg at 09/06/22 0921   escitalopram (LEXAPRO) tablet 20 mg  20 mg Oral Daily Buena Irish, MD   20 mg at 09/06/22 0921   guaiFENesin (MUCINEX) 12 hr tablet 600 mg  600 mg Oral BID PRN Fritzi Mandes, MD       heparin injection 5,000 Units  5,000 Units Subcutaneous Q8H Buena Irish, MD  5,000 Units at 09/06/22 0520   ibuprofen (ADVIL) tablet 400 mg  400 mg Oral Q6H Sophronia Simas L, MD   400 mg at 09/06/22 0520   lidocaine (LIDODERM) 5 % 3 patch  3 patch Transdermal Q24H Zigmund Daniel., MD   2 patch at 09/06/22 1610   loratadine (CLARITIN) tablet 10 mg  10 mg Oral Daily Zigmund Daniel., MD   10 mg at 09/06/22 9604   magnesium sulfate IVPB 2 g 50 mL  2 g Intravenous Once Zigmund Daniel., MD 50 mL/hr at 09/06/22 0928 2 g at 09/06/22 0928   melatonin tablet 3 mg  3 mg Oral QHS PRN Buena Irish, MD   3 mg at  09/05/22 2200   methocarbamol (ROBAXIN) tablet 500 mg  500 mg Oral TID Fritzi Mandes, MD   500 mg at 09/05/22 2159   morphine (PF) 4 MG/ML injection 4 mg  4 mg Intravenous Q2H PRN Derwood Kaplan, MD   4 mg at 09/04/22 2001   ondansetron (ZOFRAN) tablet 4 mg  4 mg Oral Q6H PRN Buena Irish, MD       Or   ondansetron [facility]) injection 4 mg  4 mg Intravenous Q6H PRN Buena Irish, MD       oxyCODONE (Oxy IR/ROXICODONE) immediate release tablet 2.5-5 mg  2.5-5 mg Oral Q4H PRN Fritzi Mandes, MD   2.5 mg at 09/05/22 0739   traZODone (DESYREL) tablet 50 mg  50 mg Oral QHS Buena Irish, MD   50 mg at 09/05/22 2200     Discharge Medications: Please see discharge summary for a list of discharge medications.  Relevant Imaging Results:  Relevant Lab Results:   Additional Information SS# [id number]  Amada Jupiter, LCSW

## 2022-09-05 NOTE — Progress Notes (Signed)
PROGRESS NOTE    Tony [name]  ONG:[id number] DOB: 05/01/1945 DOA: 09/04/2022 PCP: Chilton Greathouse, MD  Chief Complaint  Patient presents with   Fall    Brief Narrative:    Tony [name] is Tony [name] 77 y.o. male with medical history significant for Parkinson's disease with associated dementia and unsteady gait who fell at home 7/10.  He was found to have 3 rib fractures.  Assessment & Plan:   Principal Problem:   Multiple rib fractures Active Problems:   Parkinsonism   Cognitive impairment   Intractable pain  Right 10-12th Rib Fractures Minimally Displaced R 10-11th Rib Fracture Nondisplaced R 12th Rib Fracture Pain  management Mobilize, IS CXR pending this AM Appreciate surgery recs  Parkison's Disease Dementia Sinemet Aricept  Depression  Anxiety Lexapro  Insomnia Trazodone, melatonin  Dyslipidemia Statin  BPH Noted on imaging    DVT prophylaxis: heparin Code Status: full Family Communication: none Disposition:   Status is: Inpatient Remains inpatient appropriate because: continues to need inpatient care for rib fx   Consultants:  surgery  Procedures:  none  Antimicrobials:  Anti-infectives (From admission, onward)    None       Subjective: Restless, wants to get up (will discuss with RN to try to get him up to chair) C/o pain with movement   Objective: Vitals:   09/04/22 2203 09/04/22 2345 09/05/22 0137 09/05/22 0511  BP: 124/72  102/62 106/63  Pulse: 75 66 64 61  Resp: 16 16 17 17   Temp: 98.6 F (37 C)  (!) 97.5 F (36.4 C) 97.7 F (36.5 C)  TempSrc: Oral  Oral Oral  SpO2: 98% 95% 97% 99%  Weight:      Height:        Intake/Output Summary (Last 24 hours) at 09/05/2022 4401 Last data filed at 09/05/2022 0200 Gross per 24 hour  Intake 90 ml  Output --  Net 90 ml   Filed Weights   09/04/22 1537  Weight: 91.6 kg    Examination:  General exam: Appears calm and comfortable  Respiratory system:  unlabored Cardiovascular system: RRR Gastrointestinal system: Abdomen is nondistended, soft and nontender Central nervous system: Alert. No focal neurological deficits. Extremities: no LEE   Data Reviewed: I have personally reviewed following labs and imaging studies  CBC: Recent Labs  Lab 09/04/22 1826 09/04/22 2000  WBC  --  9.7  NEUTROABS  --  7.3  HGB 11.6* 10.3*  HCT 34.0* 32.0*  MCV  --  64.9*  PLT  --  194    Basic Metabolic Panel: Recent Labs  Lab 09/04/22 1826 09/05/22 0345  NA 139 137  K 3.9 3.8  CL 103 104  CO2  --  27  GLUCOSE 112* 98  BUN 13 17  CREATININE 1.00 1.05  CALCIUM  --  8.9    GFR: Estimated Creatinine Clearance: 62.6 mL/min (by C-G formula based on SCr of 1.05 mg/dL).  Liver Function Tests: Recent Labs  Lab 09/05/22 0345  AST 12*  ALT 20  ALKPHOS 70  BILITOT 0.9  PROT 6.7  ALBUMIN 3.6    CBG: No results for input(s): "GLUCAP" in the last 168 hours.   No results found for this or any previous visit (from the past 240 hour(s)).       Radiology Studies: CT ABDOMEN PELVIS WO CONTRAST  Result Date: 09/04/2022 CLINICAL DATA:  Trauma. EXAM: CT CHEST, ABDOMEN AND PELVIS WITHOUT CONTRAST TECHNIQUE: Multidetector CT imaging of the chest, abdomen and pelvis was performed  following the standard protocol without IV contrast. RADIATION DOSE REDUCTION: This exam was performed according to the departmental dose-optimization program which includes automated exposure control, adjustment of the mA and/or kV according to patient size and/or use of iterative reconstruction technique. COMPARISON:  None Available. FINDINGS: Evaluation of this exam is limited in the absence of intravenous contrast. CT CHEST FINDINGS Cardiovascular: There is no cardiomegaly or pericardial effusion. There is 3 vessel coronary vascular calcification. Mild atherosclerotic calcification of the thoracic aorta. No aneurysmal dilatation. The central pulmonary arteries are  grossly unremarkable on this noncontrast CT. Mediastinum/Nodes: No hilar or mediastinal adenopathy. Small hiatal hernia. The esophagus is grossly unremarkable. Asymmetric enlargement of the left thyroid lobe with Garlin Batdorf 3.5 cm nodule. Recommend thyroid US (ref: J Am Coll Radiol. 2015 Feb;12(2): 143-50).No mediastinal fluid collection. Lungs/Pleura: No focal consolidation, pleural effusion, or pneumothorax. The central airways are patent. Musculoskeletal: Osteopenia with degenerative changes of the spine. Mildly displaced fractures of the posterior right 10th-12th ribs. CT ABDOMEN PELVIS FINDINGS No intra-abdominal free air or free fluid. Hepatobiliary: The liver is unremarkable. No biliary dilatation. The gallbladder is unremarkable. Pancreas: Unremarkable. No pancreatic ductal dilatation or surrounding inflammatory changes. Spleen: Normal in size without focal abnormality. Adrenals/Urinary Tract: The adrenal glands are unremarkable. The kidneys, and the visualized ureters appear unremarkable. There is trabeculated appearance of the bladder wall suggestive of chronic bladder outlet obstruction. Stomach/Bowel: Moderate stool throughout the colon. There is sigmoid diverticulosis without active inflammatory changes. There is no bowel obstruction or active inflammation. The appendix is normal. Vascular/Lymphatic: Moderate aortoiliac atherosclerotic disease. The IVC is unremarkable. No portal venous gas. There is no adenopathy. Reproductive: Enlarged prostate gland measuring 5.7 cm in transverse axial diameter. The seminal vesicles are symmetric. Other: Small fat containing umbilical hernia. Musculoskeletal: Osteopenia with degenerative changes of the spine. No acute osseous pathology. Indeterminate 3.4 x 2.1 cm low attenuating, possibly cystic nodule in the subcutaneous soft tissues of the left anterior pelvic wall may represent Salena Ortlieb sebaceous cyst. Further evaluation with ultrasound on Leverett Camplin nonemergent/outpatient basis  recommended. IMPRESSION: 1. Mildly displaced fractures of the posterior right 10th-12th ribs. No pneumothorax. 2. No acute/traumatic intra-abdominal or pelvic pathology. 3. Sigmoid diverticulosis. No bowel obstruction. Normal appendix. 4. 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 5.  Aortic Atherosclerosis (ICD10-I70.0). Electronically Signed   By: Elgie Collard M.D.   On: 09/04/2022 18:29   CT CHEST WO CONTRAST  Result Date: 09/04/2022 CLINICAL DATA:  Trauma. EXAM: CT CHEST, ABDOMEN AND PELVIS WITHOUT CONTRAST TECHNIQUE: Multidetector CT imaging of the chest, abdomen and pelvis was performed following the standard protocol without IV contrast. RADIATION DOSE REDUCTION: This exam was performed according to the departmental dose-optimization program which includes automated exposure control, adjustment of the mA and/or kV according to patient size and/or use of iterative reconstruction technique. COMPARISON:  None Available. FINDINGS: Evaluation of this exam is limited in the absence of intravenous contrast. CT CHEST FINDINGS Cardiovascular: There is no cardiomegaly or pericardial effusion. There is 3 vessel coronary vascular calcification. Mild atherosclerotic calcification of the thoracic aorta. No aneurysmal dilatation. The central pulmonary arteries are grossly unremarkable on this noncontrast CT. Mediastinum/Nodes: No hilar or mediastinal adenopathy. Small hiatal hernia. The esophagus is grossly unremarkable. Asymmetric enlargement of the left thyroid lobe with Rylan Kaufmann 3.5 cm nodule. Recommend thyroid US (ref: J Am Coll Radiol. 2015 Feb;12(2): 143-50).No mediastinal fluid collection. Lungs/Pleura: No focal consolidation, pleural effusion, or pneumothorax. The central airways are patent. Musculoskeletal: Osteopenia with degenerative changes of the spine.  Mildly displaced fractures of the posterior right 10th-12th ribs. CT ABDOMEN PELVIS FINDINGS No intra-abdominal free air or  free fluid. Hepatobiliary: The liver is unremarkable. No biliary dilatation. The gallbladder is unremarkable. Pancreas: Unremarkable. No pancreatic ductal dilatation or surrounding inflammatory changes. Spleen: Normal in size without focal abnormality. Adrenals/Urinary Tract: The adrenal glands are unremarkable. The kidneys, and the visualized ureters appear unremarkable. There is trabeculated appearance of the bladder wall suggestive of chronic bladder outlet obstruction. Stomach/Bowel: Moderate stool throughout the colon. There is sigmoid diverticulosis without active inflammatory changes. There is no bowel obstruction or active inflammation. The appendix is normal. Vascular/Lymphatic: Moderate aortoiliac atherosclerotic disease. The IVC is unremarkable. No portal venous gas. There is no adenopathy. Reproductive: Enlarged prostate gland measuring 5.7 cm in transverse axial diameter. The seminal vesicles are symmetric. Other: Small fat containing umbilical hernia. Musculoskeletal: Osteopenia with degenerative changes of the spine. No acute osseous pathology. Indeterminate 3.4 x 2.1 cm low attenuating, possibly cystic nodule in the subcutaneous soft tissues of the left anterior pelvic wall may represent Harmoney Sienkiewicz sebaceous cyst. Further evaluation with ultrasound on Atiyana Welte nonemergent/outpatient basis recommended. IMPRESSION: 1. Mildly displaced fractures of the posterior right 10th-12th ribs. No pneumothorax. 2. No acute/traumatic intra-abdominal or pelvic pathology. 3. Sigmoid diverticulosis. No bowel obstruction. Normal appendix. 4. 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 5.  Aortic Atherosclerosis (ICD10-I70.0). Electronically Signed   By: Elgie Collard M.D.   On: 09/04/2022 18:29   CT Head Wo Contrast  Result Date: 09/04/2022 CLINICAL DATA:  Head trauma, minor (Age >= 65y), fall EXAM: CT HEAD WITHOUT CONTRAST TECHNIQUE: Contiguous axial images were obtained from the base of the  skull through the vertex without intravenous contrast. RADIATION DOSE REDUCTION: This exam was performed according to the departmental dose-optimization program which includes automated exposure control, adjustment of the mA and/or kV according to patient size and/or use of iterative reconstruction technique. COMPARISON:  None Available. FINDINGS: Brain: Diffuse cerebral atrophy. No acute intracranial abnormality. Specifically, no hemorrhage, hydrocephalus, mass lesion, acute infarction, or significant intracranial injury. Vascular: No hyperdense vessel or unexpected calcification. Skull: No acute calvarial abnormality. Sinuses/Orbits: Mucosal thickening throughout the right paranasal sinuses. No air-fluid levels. Other: None IMPRESSION: Diffuse cerebral atrophy.  No acute intracranial abnormality. Right chronic sinusitis. Electronically Signed   By: Charlett Nose M.D.   On: 09/04/2022 18:15   DG Ribs Unilateral W/Chest Right  Result Date: 09/04/2022 CLINICAL DATA:  fall EXAM: RIGHT RIBS AND CHEST - 3+ VIEW COMPARISON:  September 11, 2006 FINDINGS: There is Jenavie Stanczak minimally displaced fracture of the RIGHT tenth and eleventh ribs. Nondisplaced fracture of the RIGHT twelfth rib. No pneumothorax or pleural effusion. Atherosclerotic calcifications of the aorta. IMPRESSION: Minimally displaced fractures of the RIGHT tenth and eleventh ribs. Nondisplaced fracture of the RIGHT twelfth rib. Electronically Signed   By: Meda Klinefelter M.D.   On: 09/04/2022 18:03        Scheduled Meds:  acetaminophen  1,000 mg Oral Q8H   atorvastatin  20 mg Oral QPM   carbidopa-levodopa  2 tablet Oral 3 times per day   donepezil  10 mg Oral Daily   escitalopram  20 mg Oral Daily   heparin  5,000 Units Subcutaneous Q8H   ibuprofen  400 mg Oral Q6H   lidocaine  3 patch Transdermal Q24H   methocarbamol  500 mg Oral TID   traZODone  50 mg Oral QHS   Continuous Infusions:   LOS: 1 day    Time spent: over  30 min    Lacretia Nicks, MD [facility]   To contact the attending provider between 7A-7P or the covering provider during after hours 7P-7A, please log into the web site [email or web address] and access using universal [place] password for that web site. If you do not have the password, please call the hospital operator.  09/05/2022, 8:52 AM

## 2022-09-05 NOTE — Evaluation (Signed)
Occupational Therapy Evaluation Patient Details Name: Tony [name] MRN: [id number] DOB: 02/25/1946 Today's Date: 09/05/2022   History of Present Illness Tony [name] is a 77 y.o. male with medical history significant for Parkinson's disease with associated dementia and unsteady gait who fell at home 7/10.  He was found to have 3 rib fractures on RT side 10th-12th, mildly displaced.   Clinical Impression   Patient is currently requiring assistance with ADLs including up to minimal assist with Lower body ADLs, up to moderate assist with Upper body ADLs,  as well as  light moderate assist with bed mobility and minimal assist with functional transfers to toilet using RW.   Current level of function is below patient's typical baseline, however family does endorse pt is assisted with most ADLs and supervised when ambulating in hallway at his ALF.  During this evaluation, patient was limited by neurological symptoms including LT UE inattention, RT peripheral vision Impairments, posterior bias with balance, cognitive deficit with short term memory loss, and pain to RT rib area, all of which has the potential to impact patient's safety and independence during functional mobility, as well as performance for ADLs.  Patient lives at [facility], with supportive family who supervise pt ad lib, but unable to provide close 24/7 supervision.   Patient demonstrates fair rehab potential, and should benefit from continued skilled occupational therapy services while in acute care to maximize safety, independence and quality of life at home.  Continued occupational therapy services are recommended.  ?       Recommendations for follow up therapy are one component of a multi-disciplinary discharge planning process, led by the attending physician.  Recommendations may be updated based on patient status, additional functional criteria and insurance authorization.   Assistance Recommended at Discharge Frequent or  constant Supervision/Assistance  Patient can return home with the following Assistance with feeding;Help with stairs or ramp for entrance;Assist for transportation;Assistance with cooking/housework;A lot of help with bathing/dressing/bathroom;A little help with walking and/or transfers;Direct supervision/assist for medications management;Direct supervision/assist for financial management    Functional Status Assessment  Patient has had a recent decline in their functional status and demonstrates the ability to make significant improvements in function in a reasonable and predictable amount of time.  Equipment Recommendations  None recommended by OT    Recommendations for Other Services       Precautions / Restrictions Precautions Precautions: Fall Restrictions Weight Bearing Restrictions: No      Mobility Bed Mobility Overal bed mobility: Needs Assistance Bed Mobility: Rolling, Supine to Sit Rolling: Min assist, Independent   Supine to sit: Mod assist     General bed mobility comments: Min Assist to light moderate assist needed as pt with decreased motor planning and not following instructions.    Transfers                          Balance Overall balance assessment: History of Falls, Needs assistance   Sitting balance-Leahy Scale: Fair     Standing balance support: Reliant on assistive device for balance, During functional activity, Bilateral upper extremity supported Standing balance-Leahy Scale: Poor                             ADL either performed or assessed with clinical judgement   ADL Overall ADL's : Needs assistance/impaired Eating/Feeding: Minimal assistance;Sitting Eating/Feeding Details (indicate cue type and reason): Not observed Grooming: Wash/dry face;Set up;Sitting  Upper Body Bathing: Minimal assistance;Sitting;Cueing for sequencing Upper Body Bathing Details (indicate cue type and reason): Cues to attend to LUE Lower Body  Bathing: Moderate assistance   Upper Body Dressing : Moderate assistance;Sitting;Cueing for sequencing;Cueing for compensatory techniques   Lower Body Dressing: Minimal assistance;Sit to/from stand;Sitting/lateral leans   Toilet Transfer: Minimal assistance;Rolling walker (2 wheels);Cueing for safety;Cueing for sequencing;Ambulation Toilet Transfer Details (indicate cue type and reason): Pt stood with Min As from EOB to RW. Pt ambulated in hallway with Min As, RW and chair follow for safety. Toileting- Clothing Manipulation and Hygiene: Minimal assistance;Sitting/lateral lean;Sit to/from stand Toileting - Clothing Manipulation Details (indicate cue type and reason): Incontinentn of bladder baseline.     Functional mobility during ADLs: Minimal assistance;Rolling walker (2 wheels);Cueing for sequencing;Cueing for safety       Vision Ability to See in Adequate Light: 2 Moderately impaired Vision Assessment?: Vision impaired- to be further tested in functional context Additional Comments: Pt lacking saccades. Peripheral vision significantly diminshed on the RT.  Very limited latearl eye movements with smooth pursuits.     Perception     Praxis      Pertinent Vitals/Pain Pain Assessment Pain Assessment: 0-10 Pain Score: 7  Pain Location: RT ribs Pain Descriptors / Indicators: Sharp Pain Intervention(s): Limited activity within patient's tolerance, Monitored during session, Premedicated before session, Repositioned     Hand Dominance Right   Extremity/Trunk Assessment Upper Extremity Assessment Upper Extremity Assessment: RUE deficits/detail;LUE deficits/detail RUE Deficits / Details: WFL but decreasedideomotor with inability to mirror OT making "okay" sign with digits. RUE Coordination: decreased fine motor LUE Deficits / Details: Inattention to LT UE. ROM WFL but pt does better moving BUEs or RT only. Increased difficulty to move LT UE when RUE is restrained in pt's lap. LUE  Sensation: decreased proprioception (Decreased kinesthesia) LUE Coordination: decreased gross motor;decreased fine motor   Lower Extremity Assessment Lower Extremity Assessment: Defer to PT evaluation   Cervical / Trunk Assessment Cervical / Trunk Assessment: Other exceptions Cervical / Trunk Exceptions: Posterior bias. Pt confirms he tends to fall or backwards.   Communication Communication Communication: No difficulties   Cognition Arousal/Alertness: Awake/alert Behavior During Therapy: Impulsive, Flat affect, Restless Overall Cognitive Status: History of cognitive impairments - at baseline Area of Impairment: Memory, Following commands                     Memory: Decreased short-term memory Following Commands: Follows one step commands with increased time             General Comments       Exercises     Shoulder Instructions      Home Living Family/patient expects to be discharged to:: Assisted living                             Home Equipment: Rollator (4 wheels);Rolling Walker (2 wheels);Transport chair          Prior Functioning/Environment Prior Level of Function : History of Falls (last six months);Needs assist       Physical Assist : ADLs (physical)   ADLs (physical): IADLs;Toileting;Dressing;Bathing Mobility Comments: Ambulates with Rollator to dining room, often accompanies by spouse or daughter. Uses Rollator inside his apartment. ADLs Comments: Wear Depends at baseline. Controls bowel but not bladder. Assisted with toileting, bathing and dressing.        OT Problem List: Decreased strength;Decreased range of motion;Decreased safety awareness;Decreased activity tolerance;Decreased knowledge of  use of DME or AE;Impaired UE functional use;Pain;Decreased knowledge of precautions;Impaired balance (sitting and/or standing);Impaired vision/perception;Decreased coordination      OT Treatment/Interventions: Self-care/ADL  training;Therapeutic activities;Therapeutic exercise;Neuromuscular education;Visual/perceptual remediation/compensation;Cognitive remediation/compensation;Patient/family education;DME and/or AE instruction;Balance training;Manual therapy    OT Goals(Current goals can be found in the care plan section) Acute Rehab OT Goals Patient Stated Goal: Per family, for pt to use his LUE functionally again. OT Goal Formulation: With family Time For Goal Achievement: 09/19/22 Potential to Achieve Goals: Fair ADL Goals Pt Will Perform Eating: with supervision;sitting;with adaptive utensils;with caregiver independent in assisting (Involving LUE in self-feeding at least 25% of meal with cues as needed.) Pt Will Perform Grooming: standing;with supervision (Involving LUE at least 25% of tasks with ues as needed) Pt Will Perform Upper Body Bathing: with min guard assist;sitting;with adaptive equipment;with caregiver independent in assisting (Involving LUE at least 25% of task with  cues as needed) Pt Will Perform Upper Body Dressing: with min guard assist;sitting;with caregiver independent in assisting (Involving LUE at least 25% of task with cues as needed) Pt Will Transfer to Toilet: with supervision;ambulating Additional ADL Goal #1: Pt will show overall attention to LUE , while integrating LE into tasks during functional ADLs and mobility at least 25% with cues as needed.  OT Frequency: Min 1X/week    Co-evaluation PT/OT/SLP Co-Evaluation/Treatment: Yes Reason for Co-Treatment: For patient/therapist safety   OT goals addressed during session: Strengthening/ROM;ADL's and self-care      AM-PAC OT "6 Clicks" Daily Activity     Outcome Measure Help from another person eating meals?: A Lot Help from another person taking care of personal grooming?: A Little Help from another person toileting, which includes using toliet, bedpan, or urinal?: A Little Help from another person bathing (including washing,  rinsing, drying)?: A Lot Help from another person to put on and taking off regular upper body clothing?: A Lot Help from another person to put on and taking off regular lower body clothing?: A Little 6 Click Score: 15   End of Session Equipment Utilized During Treatment: Gait belt;Rolling walker (2 wheels) Nurse Communication: Mobility status  Activity Tolerance: Patient tolerated treatment well Patient left: in chair;with chair alarm set;with family/visitor present;with call bell/phone within reach  OT Visit Diagnosis: Unsteadiness on feet (R26.81);Apraxia (R48.2);Feeding difficulties (R63.3);Hemiplegia and hemiparesis;History of falling (Z91.81);Muscle weakness (generalized) (M62.81);Low vision, both eyes (H54.2);Repeated falls (R29.6);Other abnormalities of gait and mobility (R26.89);Pain Hemiplegia - Right/Left: Left Hemiplegia - dominant/non-dominant: Non-Dominant Hemiplegia - caused by: Unspecified Pain - Right/Left: Right Pain - part of body:  (ribs/stomach)                Time: 8295-6213 OT Time Calculation (min): 41 min Charges:  OT General Charges $OT Visit: 1 Visit OT Evaluation $OT Eval Moderate Complexity: 1 Mod OT Treatments $Neuromuscular Re-education: 8-22 mins  Victorino Dike, OT Acute Rehab Services Office: [phone] 09/05/2022  Theodoro Clock 09/05/2022, 10:55 AM

## 2022-09-06 DIAGNOSIS — S2241XA Multiple fractures of ribs, right side, initial encounter for closed fracture: Secondary | ICD-10-CM | POA: Diagnosis not present

## 2022-09-06 LAB — PHOSPHORUS: Phosphorus: 4.3 mg/dL (ref 2.5–4.6)

## 2022-09-06 LAB — IRON AND TIBC
Iron: 101 ug/dL (ref 45–182)
Saturation Ratios: 39 % (ref 17.9–39.5)
TIBC: 256 ug/dL (ref 250–450)
UIBC: 155 ug/dL

## 2022-09-06 LAB — CBC
HCT: 30.1 % — ABNORMAL LOW (ref 39.0–52.0)
Hemoglobin: 9.4 g/dL — ABNORMAL LOW (ref 13.0–17.0)
MCH: 20.9 pg — ABNORMAL LOW (ref 26.0–34.0)
MCHC: 31.2 g/dL (ref 30.0–36.0)
MCV: 67 fL — ABNORMAL LOW (ref 80.0–100.0)
Platelets: 171 10*3/uL (ref 150–400)
RBC: 4.49 MIL/uL (ref 4.22–5.81)
RDW: 15.4 % (ref 11.5–15.5)
WBC: 5.2 10*3/uL (ref 4.0–10.5)
nRBC: 0 % (ref 0.0–0.2)

## 2022-09-06 LAB — HEMOGLOBIN AND HEMATOCRIT, BLOOD
HCT: 34.1 % — ABNORMAL LOW (ref 39.0–52.0)
Hemoglobin: 10.6 g/dL — ABNORMAL LOW (ref 13.0–17.0)

## 2022-09-06 LAB — FERRITIN: Ferritin: 248 ng/mL (ref 24–336)

## 2022-09-06 LAB — BASIC METABOLIC PANEL
Anion gap: 7 (ref 5–15)
BUN: 21 mg/dL (ref 8–23)
CO2: 24 mmol/L (ref 22–32)
Calcium: 8.6 mg/dL — ABNORMAL LOW (ref 8.9–10.3)
Chloride: 105 mmol/L (ref 98–111)
Creatinine, Ser: 0.87 mg/dL (ref 0.61–1.24)
GFR, Estimated: >60 mL/min (ref >60)
Glucose, Bld: 99 mg/dL (ref 70–99)
Potassium: 3.6 mmol/L (ref 3.5–5.1)
Sodium: 136 mmol/L (ref 135–145)

## 2022-09-06 LAB — FOLATE: Folate: 19.3 ng/mL (ref >5.9)

## 2022-09-06 LAB — VITAMIN B12: Vitamin B-12: 407 pg/mL (ref 180–914)

## 2022-09-06 LAB — MAGNESIUM: Magnesium: 1.6 mg/dL — ABNORMAL LOW (ref 1.7–2.4)

## 2022-09-06 MED ORDER — MAGNESIUM SULFATE 2 GM/50ML IV SOLN
2.0000 g | Freq: Once | INTRAVENOUS | Status: AC
  Administered 2022-09-06: 2 g via INTRAVENOUS
  Filled 2022-09-06: qty 50

## 2022-09-06 NOTE — TOC Progression Note (Signed)
Transition of Care [facility]) - Progression Note   Patient Details  Name: Tony [name] MRN: [id number] Date of Birth: [birth year]  [facility] CM/SW Contact  Ewing Schlein, LCSW Phone Number: 09/06/2022, 1:33 PM  Clinical Narrative: CSW met with patient and daughter, [name], regarding SNF bed offers. Family also requested CSW follow up with [facility]. CSW sent referral to [facility] for review. [facility] made bed offer and can accept the patient Monday (09/09/22). After reviewing bed offers with wife, [name], she chose [facility]. CSW updated patient and daughter. CSW started insurance authorization with Junious Dresser from [facility] [employer] for PTAR and SNF. TOC awaiting insurance approval.  Expected Discharge Plan: Skilled Nursing Facility Barriers to Discharge: Continued Medical Work up, English as a second language teacher  Expected Discharge Plan and Services In-house Referral: Clinical Social Work Post Acute Care Choice: Skilled Nursing Facility Living arrangements for the past 2 months: Assisted Living Facility ([facility])            DME Arranged: N/A DME Agency: NA  Social Determinants of Health (SDOH) Interventions SDOH Screenings   Food Insecurity: No Food Insecurity (09/04/2022)  Housing: Low Risk  (09/04/2022)  Transportation Needs: No Transportation Needs (09/04/2022)  Utilities: Not At Risk (09/04/2022)  Tobacco Use: High Risk (09/04/2022)   Readmission Risk Interventions    09/05/2022   11:23 AM  Readmission Risk Prevention Plan  Post Dischage Appt Complete  Medication Screening Complete  Transportation Screening Complete

## 2022-09-06 NOTE — Progress Notes (Signed)
Physical Therapy Treatment Patient Details Name: Tony [name] MRN: [id number] DOB: 10-10-1945 Today's Date: 09/06/2022   History of Present Illness Tony [name] is a 77 y.o. male with medical history significant for Parkinson's disease with associated dementia and unsteady gait who fell at home 7/10.  He was found to have 3 rib fractures on RT side 10th-12th, mildly displaced.    PT Comments  General Comments: AxO x 3 appears close to prior level.  Slightly slow/delayed.  Pleasant. Stated [place] sixt[place]". Pt currently resides at [facility] when he fell. Assisted OOB to amb in hallway was difficult.  General bed mobility comments: Mod Assist to light Max assist needed as pt with decreased motor planning and not following instructions.  Initial posterior LOB.  BradyKinesia.  Rigid. General transfer comment: To bring wt up and fwd and to balance in intial standing with RW.  Bradykinesia.   Rigid.  Increased assist with turns and VC's for hand placement with stand to sit. General Gait Details: amb with his personal Rollator and shoes on.  Tolerated amb 120 feet but required several standing rest breaks.  Pt presents with typical Parkinson's gait with forward flexed posture.  Festination.  Ataxia. Unsteady esp with turns and back stepping was very challenging.  HIGH FALL RISK. Pt will need ST Rehab at SNF to address mobility and functional decline prior to safely returning home.     Assistance Recommended at Discharge Frequent or constant Supervision/Assistance  If plan is discharge home, recommend the following:  Can travel by private vehicle    A little help with walking and/or transfers;A little help with bathing/dressing/bathroom;Assistance with cooking/housework;Assist for transportation   No  Equipment Recommendations  None recommended by PT    Recommendations for Other Services       Precautions / Restrictions Precautions Precautions:  Fall Precaution Comments: Hx Parkinsons Restrictions Weight Bearing Restrictions: No     Mobility  Bed Mobility Overal bed mobility: Needs Assistance Bed Mobility: Supine to Sit     Supine to sit: Mod assist     General bed mobility comments: Mod Assist to light Max assist needed as pt with decreased motor planning and not following instructions.  Initial posterior LOB.  BradyKinesia.  Rigid.    Transfers Overall transfer level: Needs assistance Equipment used: Rolling walker (2 wheels) Transfers: Sit to/from Stand Sit to Stand: Min assist, Mod assist           General transfer comment: To bring wt up and fwd and to balance in intial standing with RW.  Bradykinesia.   Rigid.  Increased assist with turns and VC's for hand placement with stand to sit.    Ambulation/Gait Ambulation/Gait assistance: Min assist Gait Distance (Feet): 120 Feet Assistive device: Rolling walker (2 wheels) Gait Pattern/deviations: Step-to pattern, Step-through pattern, Decreased step length - right, Decreased step length - left, Shuffle, Trunk flexed Gait velocity: decreased     General Gait Details: amb with his personal Rollator and shoes on.  Tolerated amb 120 feet but required several standing rest breaks.  Pt presents with typical Parkinson's gait with forward flexed posture.  Festination.  Ataxia. Unsteady esp with turns and back stepping was very challenging.  HIGH FALL RISK.   Stairs             Wheelchair Mobility     Tilt Bed    Modified Rankin (Stroke Patients Only)       Balance  Cognition Arousal/Alertness: Awake/alert Behavior During Therapy: WFL for tasks assessed/performed Overall Cognitive Status: Within Functional Limits for tasks assessed                                 General Comments: AxO x 3 appears close to prior level.  Slightly slow/delayed.  Pleasant. Stated [place] the sixties".        Exercises      General Comments        Pertinent Vitals/Pain Pain Assessment Pain Assessment: No/denies pain    Home Living                          Prior Function            PT Goals (current goals can now be found in the care plan section) Progress towards PT goals: Progressing toward goals    Frequency           PT Plan Current plan remains appropriate    Co-evaluation              AM-PAC PT "6 Clicks" Mobility   Outcome Measure  Help needed turning from your back to your side while in a flat bed without using bedrails?: A Little Help needed moving from lying on your back to sitting on the side of a flat bed without using bedrails?: A Little Help needed moving to and from a bed to a chair (including a wheelchair)?: A Little Help needed standing up from a chair using your arms (e.g., wheelchair or bedside chair)?: A Little Help needed to walk in hospital room?: A Lot Help needed climbing 3-5 steps with a railing? : Total 6 Click Score: 15    End of Session Equipment Utilized During Treatment: Gait belt Activity Tolerance: Patient tolerated treatment well Patient left: in chair;with call bell/phone within reach;with chair alarm set;with family/visitor present Nurse Communication: Mobility status PT Visit Diagnosis: History of falling (Z91.81);Other symptoms and signs involving the nervous system (R29.898);Pain;Difficulty in walking, not elsewhere classified (R26.2);Muscle weakness (generalized) (M62.81) Pain - Right/Left: Right     Time: 6578-4696 PT Time Calculation (min) (ACUTE ONLY): 25 min  Charges:    $Gait Training: 8-22 mins $Therapeutic Activity: 8-22 mins PT General Charges $$ ACUTE PT VISIT: 1 Visit                     Felecia Shelling  PTA Acute  Rehabilitation Services Office M-F          [phone]

## 2022-09-06 NOTE — Progress Notes (Signed)
PROGRESS NOTE    Tony [name]  ZOX:[id number] DOB: [birth year] DOA: 09/04/2022 PCP: Chilton Greathouse, MD  Chief Complaint  Patient presents with   Fall    Brief Narrative:    Tony [name] is Tony [name] 77 y.o. male with medical history significant for Parkinson's disease with associated dementia and unsteady gait who fell at home 7/10.  He was found to have 3 rib fractures.  Assessment & Plan:   Principal Problem:   Multiple rib fractures Active Problems:   Parkinsonism   Cognitive impairment   Intractable pain  Right 10-12th Rib Fractures Minimally Displaced R 10-11th Rib Fracture Nondisplaced R 12th Rib Fracture Pain  management Mobilize, IS CXR without focal infiltrates Appreciate surgery recs  Anemia Downtrending, no si/sx bleeding Will repeat H/H and trend Anemia labs  Parkison's Disease Dementia Sinemet Aricept  Depression  Anxiety Lexapro  Insomnia Trazodone, melatonin  Dyslipidemia Statin  BPH Noted on imaging    DVT prophylaxis: heparin Code Status: full Family Communication: none Disposition:   Status is: Inpatient Remains inpatient appropriate because: awaiting placement   Consultants:  surgery  Procedures:  none  Antimicrobials:  Anti-infectives (From admission, onward)    None       Subjective: C/o back pain from laying in bed   Objective: Vitals:   09/05/22 0511 09/05/22 1334 09/05/22 2114 09/06/22 0421  BP: 106/63 (!) 144/74 132/71 120/78  Pulse: 61 84 72 68  Resp: 17 20 17 18   Temp: 97.7 F (36.5 C) 98 F (36.7 C) 98.2 F (36.8 C) 97.8 F (36.6 C)  TempSrc: Oral Oral Oral   SpO2: 99% 98% 97% 100%  Weight:      Height:        Intake/Output Summary (Last 24 hours) at 09/06/2022 0933 Last data filed at 09/06/2022 0600 Gross per 24 hour  Intake 480 ml  Output 240 ml  Net 240 ml   Filed Weights   09/04/22 1537  Weight: 91.6 kg    Examination:  General: No acute distress. Lungs: unlabored Neurological:  Alert and oriented 3. Moves all extremities 4 with equal strength. Cranial nerves II through XII grossly intact. Extremities: trace edema   Data Reviewed: I have personally reviewed following labs and imaging studies  CBC: Recent Labs  Lab 09/04/22 1826 09/04/22 2000 09/06/22 0334  WBC  --  9.7 5.2  NEUTROABS  --  7.3  --   HGB 11.6* 10.3* 9.4*  HCT 34.0* 32.0* 30.1*  MCV  --  64.9* 67.0*  PLT  --  194 171    Basic Metabolic Panel: Recent Labs  Lab 09/04/22 1826 09/05/22 0345 09/06/22 0334  NA 139 137 136  K 3.9 3.8 3.6  CL 103 104 105  CO2  --  27 24  GLUCOSE 112* 98 99  BUN 13 17 21   CREATININE 1.00 1.05 0.87  CALCIUM  --  8.9 8.6*  MG  --   --  1.6*  PHOS  --   --  4.3    GFR: Estimated Creatinine Clearance: 75.6 mL/min (by C-G formula based on SCr of 0.87 mg/dL).  Liver Function Tests: Recent Labs  Lab 09/05/22 0345  AST 12*  ALT 20  ALKPHOS 70  BILITOT 0.9  PROT 6.7  ALBUMIN 3.6    CBG: No results for input(s): "GLUCAP" in the last 168 hours.   No results found for this or any previous visit (from the past 240 hour(s)).       Radiology Studies: DG CHEST  PORT 1 VIEW  Result Date: 09/05/2022 CLINICAL DATA:  Right rib fractures EXAM: PORTABLE CHEST 1 VIEW COMPARISON:  Previous studies done on 09/04/2022 FINDINGS: Cardiac size is within normal limits. There are no signs of pulmonary edema or focal pulmonary consolidation. There is no definite pleural effusion or pneumothorax. Skin fold is noted overlying the lateral aspect of right lower lung field. Slightly displaced fracture is seen in the lateral aspect of right tenth rib. Fractures of right eleventh and twelfth ribs are not optimally visualized in the current study. IMPRESSION: There are no focal infiltrates or signs of pulmonary edema. There is no significant pleural effusion or pneumothorax. Air soft tissue interface seen in the lateral aspect of right lower lung field may be caused by his  skin fold. Electronically Signed   By: Ernie Avena M.D.   On: 09/05/2022 09:02   CT ABDOMEN PELVIS WO CONTRAST  Result Date: 09/04/2022 CLINICAL DATA:  Trauma. EXAM: CT CHEST, ABDOMEN AND PELVIS WITHOUT CONTRAST TECHNIQUE: Multidetector CT imaging of the chest, abdomen and pelvis was performed following the standard protocol without IV contrast. RADIATION DOSE REDUCTION: This exam was performed according to the departmental dose-optimization program which includes automated exposure control, adjustment of the mA and/or kV according to patient size and/or use of iterative reconstruction technique. COMPARISON:  None Available. FINDINGS: Evaluation of this exam is limited in the absence of intravenous contrast. CT CHEST FINDINGS Cardiovascular: There is no cardiomegaly or pericardial effusion. There is 3 vessel coronary vascular calcification. Mild atherosclerotic calcification of the thoracic aorta. No aneurysmal dilatation. The central pulmonary arteries are grossly unremarkable on this noncontrast CT. Mediastinum/Nodes: No hilar or mediastinal adenopathy. Small hiatal hernia. The esophagus is grossly unremarkable. Asymmetric enlargement of the left thyroid lobe with Tony [name] 3.5 cm nodule. Recommend thyroid US (ref: J Am Coll Radiol. 2015 Feb;12(2): 143-50).No mediastinal fluid collection. Lungs/Pleura: No focal consolidation, pleural effusion, or pneumothorax. The central airways are patent. Musculoskeletal: Osteopenia with degenerative changes of the spine. Mildly displaced fractures of the posterior right 10th-12th ribs. CT ABDOMEN PELVIS FINDINGS No intra-abdominal free air or free fluid. Hepatobiliary: The liver is unremarkable. No biliary dilatation. The gallbladder is unremarkable. Pancreas: Unremarkable. No pancreatic ductal dilatation or surrounding inflammatory changes. Spleen: Normal in size without focal abnormality. Adrenals/Urinary Tract: The adrenal glands are unremarkable. The kidneys, and the  visualized ureters appear unremarkable. There is trabeculated appearance of the bladder wall suggestive of chronic bladder outlet obstruction. Stomach/Bowel: Moderate stool throughout the colon. There is sigmoid diverticulosis without active inflammatory changes. There is no bowel obstruction or active inflammation. The appendix is normal. Vascular/Lymphatic: Moderate aortoiliac atherosclerotic disease. The IVC is unremarkable. No portal venous gas. There is no adenopathy. Reproductive: Enlarged prostate gland measuring 5.7 cm in transverse axial diameter. The seminal vesicles are symmetric. Other: Small fat containing umbilical hernia. Musculoskeletal: Osteopenia with degenerative changes of the spine. No acute osseous pathology. Indeterminate 3.4 x 2.1 cm low attenuating, possibly cystic nodule in the subcutaneous soft tissues of the left anterior pelvic wall may represent Etheline Geppert sebaceous cyst. Further evaluation with ultrasound on Sarabelle Genson nonemergent/outpatient basis recommended. IMPRESSION: 1. Mildly displaced fractures of the posterior right 10th-12th ribs. No pneumothorax. 2. No acute/traumatic intra-abdominal or pelvic pathology. 3. Sigmoid diverticulosis. No bowel obstruction. Normal appendix. 4. 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 5.  Aortic Atherosclerosis (ICD10-I70.0). Electronically Signed   By: Elgie Collard M.D.   On: 09/04/2022 18:29   CT CHEST WO CONTRAST  Result Date: 09/04/2022 CLINICAL  DATA:  Trauma. EXAM: CT CHEST, ABDOMEN AND PELVIS WITHOUT CONTRAST TECHNIQUE: Multidetector CT imaging of the chest, abdomen and pelvis was performed following the standard protocol without IV contrast. RADIATION DOSE REDUCTION: This exam was performed according to the departmental dose-optimization program which includes automated exposure control, adjustment of the mA and/or kV according to patient size and/or use of iterative reconstruction technique. COMPARISON:  None  Available. FINDINGS: Evaluation of this exam is limited in the absence of intravenous contrast. CT CHEST FINDINGS Cardiovascular: There is no cardiomegaly or pericardial effusion. There is 3 vessel coronary vascular calcification. Mild atherosclerotic calcification of the thoracic aorta. No aneurysmal dilatation. The central pulmonary arteries are grossly unremarkable on this noncontrast CT. Mediastinum/Nodes: No hilar or mediastinal adenopathy. Small hiatal hernia. The esophagus is grossly unremarkable. Asymmetric enlargement of the left thyroid lobe with Marco Raper 3.5 cm nodule. Recommend thyroid US (ref: J Am Coll Radiol. 2015 Feb;12(2): 143-50).No mediastinal fluid collection. Lungs/Pleura: No focal consolidation, pleural effusion, or pneumothorax. The central airways are patent. Musculoskeletal: Osteopenia with degenerative changes of the spine. Mildly displaced fractures of the posterior right 10th-12th ribs. CT ABDOMEN PELVIS FINDINGS No intra-abdominal free air or free fluid. Hepatobiliary: The liver is unremarkable. No biliary dilatation. The gallbladder is unremarkable. Pancreas: Unremarkable. No pancreatic ductal dilatation or surrounding inflammatory changes. Spleen: Normal in size without focal abnormality. Adrenals/Urinary Tract: The adrenal glands are unremarkable. The kidneys, and the visualized ureters appear unremarkable. There is trabeculated appearance of the bladder wall suggestive of chronic bladder outlet obstruction. Stomach/Bowel: Moderate stool throughout the colon. There is sigmoid diverticulosis without active inflammatory changes. There is no bowel obstruction or active inflammation. The appendix is normal. Vascular/Lymphatic: Moderate aortoiliac atherosclerotic disease. The IVC is unremarkable. No portal venous gas. There is no adenopathy. Reproductive: Enlarged prostate gland measuring 5.7 cm in transverse axial diameter. The seminal vesicles are symmetric. Other: Small fat containing  umbilical hernia. Musculoskeletal: Osteopenia with degenerative changes of the spine. No acute osseous pathology. Indeterminate 3.4 x 2.1 cm low attenuating, possibly cystic nodule in the subcutaneous soft tissues of the left anterior pelvic wall may represent Ranard Harte sebaceous cyst. Further evaluation with ultrasound on Terecia Plaut nonemergent/outpatient basis recommended. IMPRESSION: 1. Mildly displaced fractures of the posterior right 10th-12th ribs. No pneumothorax. 2. No acute/traumatic intra-abdominal or pelvic pathology. 3. Sigmoid diverticulosis. No bowel obstruction. Normal appendix. 4. 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 5.  Aortic Atherosclerosis (ICD10-I70.0). Electronically Signed   By: Elgie Collard M.D.   On: 09/04/2022 18:29   CT Head Wo Contrast  Result Date: 09/04/2022 CLINICAL DATA:  Head trauma, minor (Age >= 65y), fall EXAM: CT HEAD WITHOUT CONTRAST TECHNIQUE: Contiguous axial images were obtained from the base of the skull through the vertex without intravenous contrast. RADIATION DOSE REDUCTION: This exam was performed according to the departmental dose-optimization program which includes automated exposure control, adjustment of the mA and/or kV according to patient size and/or use of iterative reconstruction technique. COMPARISON:  None Available. FINDINGS: Brain: Diffuse cerebral atrophy. No acute intracranial abnormality. Specifically, no hemorrhage, hydrocephalus, mass lesion, acute infarction, or significant intracranial injury. Vascular: No hyperdense vessel or unexpected calcification. Skull: No acute calvarial abnormality. Sinuses/Orbits: Mucosal thickening throughout the right paranasal sinuses. No air-fluid levels. Other: None IMPRESSION: Diffuse cerebral atrophy.  No acute intracranial abnormality. Right chronic sinusitis. Electronically Signed   By: Charlett Nose M.D.   On: 09/04/2022 18:15   DG Ribs Unilateral W/Chest Right  Result Date:  09/04/2022 CLINICAL DATA:  fall  EXAM: RIGHT RIBS AND CHEST - 3+ VIEW COMPARISON:  September 11, 2006 FINDINGS: There is Shemaiah Round minimally displaced fracture of the RIGHT tenth and eleventh ribs. Nondisplaced fracture of the RIGHT twelfth rib. No pneumothorax or pleural effusion. Atherosclerotic calcifications of the aorta. IMPRESSION: Minimally displaced fractures of the RIGHT tenth and eleventh ribs. Nondisplaced fracture of the RIGHT twelfth rib. Electronically Signed   By: Meda Klinefelter M.D.   On: 09/04/2022 18:03        Scheduled Meds:  acetaminophen  1,000 mg Oral Q8H   atorvastatin  20 mg Oral QPM   carbidopa-levodopa  2 tablet Oral 3 times per day   donepezil  10 mg Oral Daily   escitalopram  20 mg Oral Daily   heparin  5,000 Units Subcutaneous Q8H   ibuprofen  400 mg Oral Q6H   lidocaine  3 patch Transdermal Q24H   loratadine  10 mg Oral Daily   methocarbamol  500 mg Oral TID   traZODone  50 mg Oral QHS   Continuous Infusions:  magnesium sulfate bolus IVPB 2 g (09/06/22 0928)     LOS: 2 days    Time spent: over 30 min    Lacretia Nicks, MD [facility]   To contact the attending provider between 7A-7P or the covering provider during after hours 7P-7A, please log into the web site [email or web address] and access using universal [place] password for that web site. If you do not have the password, please call the hospital operator.  09/06/2022, 9:33 AM

## 2022-09-06 NOTE — Progress Notes (Signed)
Subjective/Chief Complaint: Reports pain is well-controlled.  Complains of not sleeping well due to noise outside of his room throughout the night   Objective: Vital signs in last 24 hours: Temp:  [97.8 F (36.6 C)-98.2 F (36.8 C)] 97.8 F (36.6 C) (07/12 0421) Pulse Rate:  [68-84] 68 (07/12 0421) Resp:  [17-20] 18 (07/12 0421) BP: (120-144)/(71-78) 120/78 (07/12 0421) SpO2:  [97 %-100 %] 100 % (07/12 0421) Last BM Date : 09/03/22  Intake/Output from previous day: 07/11 0701 - 07/12 0700 In: 480 [P.O.:480] Out: 240 [Urine:240] Intake/Output this shift: No intake/output data recorded.  Alert, well-appearing Unlabored respirations, 1000-1500 on IS  Lab Results:  Recent Labs    09/04/22 2000 09/06/22 0334  WBC 9.7 5.2  HGB 10.3* 9.4*  HCT 32.0* 30.1*  PLT 194 171   BMET Recent Labs    09/05/22 0345 09/06/22 0334  NA 137 136  K 3.8 3.6  CL 104 105  CO2 27 24  GLUCOSE 98 99  BUN 17 21  CREATININE 1.05 0.87  CALCIUM 8.9 8.6*   PT/INR No results for input(s): "LABPROT", "INR" in the last 72 hours. ABG No results for input(s): "PHART", "HCO3" in the last 72 hours.  Invalid input(s): "PCO2", "PO2"  Studies/Results: DG CHEST PORT 1 VIEW  Result Date: 09/05/2022 CLINICAL DATA:  Right rib fractures EXAM: PORTABLE CHEST 1 VIEW COMPARISON:  Previous studies done on 09/04/2022 FINDINGS: Cardiac size is within normal limits. There are no signs of pulmonary edema or focal pulmonary consolidation. There is no definite pleural effusion or pneumothorax. Skin fold is noted overlying the lateral aspect of right lower lung field. Slightly displaced fracture is seen in the lateral aspect of right tenth rib. Fractures of right eleventh and twelfth ribs are not optimally visualized in the current study. IMPRESSION: There are no focal infiltrates or signs of pulmonary edema. There is no significant pleural effusion or pneumothorax. Air soft tissue interface seen in the lateral  aspect of right lower lung field may be caused by his skin fold. Electronically Signed   By: Ernie Avena M.D.   On: 09/05/2022 09:02   CT ABDOMEN PELVIS WO CONTRAST  Result Date: 09/04/2022 CLINICAL DATA:  Trauma. EXAM: CT CHEST, ABDOMEN AND PELVIS WITHOUT CONTRAST TECHNIQUE: Multidetector CT imaging of the chest, abdomen and pelvis was performed following the standard protocol without IV contrast. RADIATION DOSE REDUCTION: This exam was performed according to the departmental dose-optimization program which includes automated exposure control, adjustment of the mA and/or kV according to patient size and/or use of iterative reconstruction technique. COMPARISON:  None Available. FINDINGS: Evaluation of this exam is limited in the absence of intravenous contrast. CT CHEST FINDINGS Cardiovascular: There is no cardiomegaly or pericardial effusion. There is 3 vessel coronary vascular calcification. Mild atherosclerotic calcification of the thoracic aorta. No aneurysmal dilatation. The central pulmonary arteries are grossly unremarkable on this noncontrast CT. Mediastinum/Nodes: No hilar or mediastinal adenopathy. Small hiatal hernia. The esophagus is grossly unremarkable. Asymmetric enlargement of the left thyroid lobe with a 3.5 cm nodule. Recommend thyroid US (ref: J Am Coll Radiol. 2015 Feb;12(2): 143-50).No mediastinal fluid collection. Lungs/Pleura: No focal consolidation, pleural effusion, or pneumothorax. The central airways are patent. Musculoskeletal: Osteopenia with degenerative changes of the spine. Mildly displaced fractures of the posterior right 10th-12th ribs. CT ABDOMEN PELVIS FINDINGS No intra-abdominal free air or free fluid. Hepatobiliary: The liver is unremarkable. No biliary dilatation. The gallbladder is unremarkable. Pancreas: Unremarkable. No pancreatic ductal dilatation or surrounding inflammatory changes.  Spleen: Normal in size without focal abnormality. Adrenals/Urinary Tract: The  adrenal glands are unremarkable. The kidneys, and the visualized ureters appear unremarkable. There is trabeculated appearance of the bladder wall suggestive of chronic bladder outlet obstruction. Stomach/Bowel: Moderate stool throughout the colon. There is sigmoid diverticulosis without active inflammatory changes. There is no bowel obstruction or active inflammation. The appendix is normal. Vascular/Lymphatic: Moderate aortoiliac atherosclerotic disease. The IVC is unremarkable. No portal venous gas. There is no adenopathy. Reproductive: Enlarged prostate gland measuring 5.7 cm in transverse axial diameter. The seminal vesicles are symmetric. Other: Small fat containing umbilical hernia. Musculoskeletal: Osteopenia with degenerative changes of the spine. No acute osseous pathology. Indeterminate 3.4 x 2.1 cm low attenuating, possibly cystic nodule in the subcutaneous soft tissues of the left anterior pelvic wall may represent a sebaceous cyst. Further evaluation with ultrasound on a nonemergent/outpatient basis recommended. IMPRESSION: 1. Mildly displaced fractures of the posterior right 10th-12th ribs. No pneumothorax. 2. No acute/traumatic intra-abdominal or pelvic pathology. 3. Sigmoid diverticulosis. No bowel obstruction. Normal appendix. 4. 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 5.  Aortic Atherosclerosis (ICD10-I70.0). Electronically Signed   By: Elgie Collard M.D.   On: 09/04/2022 18:29   CT CHEST WO CONTRAST  Result Date: 09/04/2022 CLINICAL DATA:  Trauma. EXAM: CT CHEST, ABDOMEN AND PELVIS WITHOUT CONTRAST TECHNIQUE: Multidetector CT imaging of the chest, abdomen and pelvis was performed following the standard protocol without IV contrast. RADIATION DOSE REDUCTION: This exam was performed according to the departmental dose-optimization program which includes automated exposure control, adjustment of the mA and/or kV according to patient size and/or use of  iterative reconstruction technique. COMPARISON:  None Available. FINDINGS: Evaluation of this exam is limited in the absence of intravenous contrast. CT CHEST FINDINGS Cardiovascular: There is no cardiomegaly or pericardial effusion. There is 3 vessel coronary vascular calcification. Mild atherosclerotic calcification of the thoracic aorta. No aneurysmal dilatation. The central pulmonary arteries are grossly unremarkable on this noncontrast CT. Mediastinum/Nodes: No hilar or mediastinal adenopathy. Small hiatal hernia. The esophagus is grossly unremarkable. Asymmetric enlargement of the left thyroid lobe with a 3.5 cm nodule. Recommend thyroid US (ref: J Am Coll Radiol. 2015 Feb;12(2): 143-50).No mediastinal fluid collection. Lungs/Pleura: No focal consolidation, pleural effusion, or pneumothorax. The central airways are patent. Musculoskeletal: Osteopenia with degenerative changes of the spine. Mildly displaced fractures of the posterior right 10th-12th ribs. CT ABDOMEN PELVIS FINDINGS No intra-abdominal free air or free fluid. Hepatobiliary: The liver is unremarkable. No biliary dilatation. The gallbladder is unremarkable. Pancreas: Unremarkable. No pancreatic ductal dilatation or surrounding inflammatory changes. Spleen: Normal in size without focal abnormality. Adrenals/Urinary Tract: The adrenal glands are unremarkable. The kidneys, and the visualized ureters appear unremarkable. There is trabeculated appearance of the bladder wall suggestive of chronic bladder outlet obstruction. Stomach/Bowel: Moderate stool throughout the colon. There is sigmoid diverticulosis without active inflammatory changes. There is no bowel obstruction or active inflammation. The appendix is normal. Vascular/Lymphatic: Moderate aortoiliac atherosclerotic disease. The IVC is unremarkable. No portal venous gas. There is no adenopathy. Reproductive: Enlarged prostate gland measuring 5.7 cm in transverse axial diameter. The seminal  vesicles are symmetric. Other: Small fat containing umbilical hernia. Musculoskeletal: Osteopenia with degenerative changes of the spine. No acute osseous pathology. Indeterminate 3.4 x 2.1 cm low attenuating, possibly cystic nodule in the subcutaneous soft tissues of the left anterior pelvic wall may represent a sebaceous cyst. Further evaluation with ultrasound on a nonemergent/outpatient basis recommended. IMPRESSION: 1. Mildly displaced fractures of the posterior right  10th-12th ribs. No pneumothorax. 2. No acute/traumatic intra-abdominal or pelvic pathology. 3. Sigmoid diverticulosis. No bowel obstruction. Normal appendix. 4. 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 5.  Aortic Atherosclerosis (ICD10-I70.0). Electronically Signed   By: Elgie Collard M.D.   On: 09/04/2022 18:29   CT Head Wo Contrast  Result Date: 09/04/2022 CLINICAL DATA:  Head trauma, minor (Age >= 65y), fall EXAM: CT HEAD WITHOUT CONTRAST TECHNIQUE: Contiguous axial images were obtained from the base of the skull through the vertex without intravenous contrast. RADIATION DOSE REDUCTION: This exam was performed according to the departmental dose-optimization program which includes automated exposure control, adjustment of the mA and/or kV according to patient size and/or use of iterative reconstruction technique. COMPARISON:  None Available. FINDINGS: Brain: Diffuse cerebral atrophy. No acute intracranial abnormality. Specifically, no hemorrhage, hydrocephalus, mass lesion, acute infarction, or significant intracranial injury. Vascular: No hyperdense vessel or unexpected calcification. Skull: No acute calvarial abnormality. Sinuses/Orbits: Mucosal thickening throughout the right paranasal sinuses. No air-fluid levels. Other: None IMPRESSION: Diffuse cerebral atrophy.  No acute intracranial abnormality. Right chronic sinusitis. Electronically Signed   By: Charlett Nose M.D.   On: 09/04/2022 18:15   DG  Ribs Unilateral W/Chest Right  Result Date: 09/04/2022 CLINICAL DATA:  fall EXAM: RIGHT RIBS AND CHEST - 3+ VIEW COMPARISON:  September 11, 2006 FINDINGS: There is a minimally displaced fracture of the RIGHT tenth and eleventh ribs. Nondisplaced fracture of the RIGHT twelfth rib. No pneumothorax or pleural effusion. Atherosclerotic calcifications of the aorta. IMPRESSION: Minimally displaced fractures of the RIGHT tenth and eleventh ribs. Nondisplaced fracture of the RIGHT twelfth rib. Electronically Signed   By: Meda Klinefelter M.D.   On: 09/04/2022 18:03    Anti-infectives: Anti-infectives (From admission, onward)    None       Assessment/Plan:  77 yo male s/p ground-level fall with multiple rib fractures ( R posterior 10-12). Continue aggressive pulmonary toilet, multimodal pain control.  Follow-up chest x-ray yesterday without pneumothorax or effusion.  He is saturating well on room air.  Surgery team will sign off at this time.  Okay for discharge when cleared by primary team.  He can follow-up with his primary care doctor and does not need surgical follow-up.   LOS: 2 days    Berna Bue 09/06/2022

## 2022-09-07 DIAGNOSIS — S2241XA Multiple fractures of ribs, right side, initial encounter for closed fracture: Secondary | ICD-10-CM | POA: Diagnosis not present

## 2022-09-07 MED ORDER — POLYETHYLENE GLYCOL 3350 17 G PO PACK
17.0000 g | PACK | Freq: Two times a day (BID) | ORAL | Status: DC
  Administered 2022-09-07 – 2022-09-08 (×2): 17 g via ORAL
  Filled 2022-09-07 (×2): qty 1

## 2022-09-07 NOTE — Plan of Care (Signed)
  Problem: Coping: Goal: Level of anxiety will decrease Outcome: Progressing   Problem: Pain Managment: Goal: General experience of comfort will improve Outcome: Progressing   

## 2022-09-07 NOTE — Progress Notes (Signed)
Physical Therapy Treatment Patient Details Name: Tony [name] MRN: [id number] DOB: 1945-05-10 Today's Date: 09/07/2022   History of Present Illness Tony [name] is a 77 y.o. male with medical history significant for Parkinson's disease with associated dementia and unsteady gait who fell at home 7/10.  He was found to have 3 rib fractures on RT side 10th-12th, mildly displaced.    PT Comments  Pt received OOB in recliner having just finished ambulation to restroom but agreeable to be seen. Pt required min guard for transfers to 4WRW and ambulated 269ft with min guard with Parkinsonian gait. Once returned to recliner, educated pt on splinting of ribs with pillowing during thoracic mobility such as bed mobility as well as coughing/laughing/sneezing etc, pt demonstrated good technique with cuing. Also educated pt and daughters on getting OOB on L side to reduce thoracic pressure on R rib fxs as well as rolling/sidelying to sit rather than supine to sit, verbalized understanding. Discharge destination remains appropriate. We will continue to follow acutely     Assistance Recommended at Discharge Frequent or constant Supervision/Assistance  If plan is discharge home, recommend the following:  Can travel by private vehicle    A little help with walking and/or transfers;A little help with bathing/dressing/bathroom;Assistance with cooking/housework;Assist for transportation   Yes  Equipment Recommendations  None recommended by PT    Recommendations for Other Services       Precautions / Restrictions Precautions Precautions: Fall Precaution Comments: Hx Parkinsons Restrictions Weight Bearing Restrictions: No     Mobility  Bed Mobility               General bed mobility comments: Pt OOB in recliner at entry and exit    Transfers Overall transfer level: Needs assistance Equipment used: Rolling walker (2 wheels) Transfers: Sit to/from Stand Sit to Stand: Min guard            General transfer comment: Pt instructed in proper STS form, on first attempt pt did not acheive sufficient anterior weight shift and after clearing ITs sat back down in recliner. With additional multimodal cuing and anterior weight shift pt min guard and able to comlpete transfer.    Ambulation/Gait Ambulation/Gait assistance: Min assist Gait Distance (Feet): 250 Feet Assistive device: Rolling walker (2 wheels) Gait Pattern/deviations: Step-to pattern, Step-through pattern, Decreased step length - right, Decreased step length - left, Shuffle, Trunk flexed, Festinating Gait velocity: decreased     General Gait Details: Pt instructed in gait training with 4WRW and min guard 225ft , festination, flexed posture, but with good proximityt o device.   Stairs             Wheelchair Mobility     Tilt Bed    Modified Rankin (Stroke Patients Only)       Balance Overall balance assessment: History of Falls, Needs assistance   Sitting balance-Leahy Scale: Fair     Standing balance support: Reliant on assistive device for balance, During functional activity, Bilateral upper extremity supported Standing balance-Leahy Scale: Poor                              Cognition Arousal/Alertness: Awake/alert Behavior During Therapy: WFL for tasks assessed/performed Overall Cognitive Status: Within Functional Limits for tasks assessed Area of Impairment: Memory, Following commands                     Memory: Decreased short-term memory Following Commands: Follows one step commands  with increased time       General Comments: AxO x 3 appears close to prior level.  Slightly slow/delayed.  Pleasant. Stated [place] sixties".        Exercises      General Comments General comments (skin integrity, edema, etc.): Two daugthers present      Pertinent Vitals/Pain Pain Assessment Pain Assessment: 0-10 Pain Score: 5  Pain Location:  RT ribs Pain Descriptors / Indicators: Sharp Pain Intervention(s): Limited activity within patient's tolerance, Monitored during session, Repositioned, Patient requesting pain meds-RN notified    Home Living                          Prior Function            PT Goals (current goals can now be found in the care plan section) Acute Rehab PT Goals Patient Stated Goal: Regain IND PT Goal Formulation: With patient Time For Goal Achievement: 09/19/22 Potential to Achieve Goals: Good Progress towards PT goals: Progressing toward goals    Frequency           PT Plan Current plan remains appropriate    Co-evaluation              AM-PAC PT "6 Clicks" Mobility   Outcome Measure  Help needed turning from your back to your side while in a flat bed without using bedrails?: A Little Help needed moving from lying on your back to sitting on the side of a flat bed without using bedrails?: A Little Help needed moving to and from a bed to a chair (including a wheelchair)?: A Little Help needed standing up from a chair using your arms (e.g., wheelchair or bedside chair)?: A Little Help needed to walk in hospital room?: A Little Help needed climbing 3-5 steps with a railing? : Total 6 Click Score: 16    End of Session Equipment Utilized During Treatment: Gait belt Activity Tolerance: Patient tolerated treatment well;No increased pain Patient left: in chair;with call bell/phone within reach;with chair alarm set;with family/visitor present Nurse Communication: Mobility status PT Visit Diagnosis: History of falling (Z91.81);Other symptoms and signs involving the nervous system (R29.898);Pain;Difficulty in walking, not elsewhere classified (R26.2);Muscle weakness (generalized) (M62.81) Pain - Right/Left: Right Pain - part of body:  (ribs 10 - 12)     Time: 9562-1308 PT Time Calculation (min) (ACUTE ONLY): 17 min  Charges:    $Gait Training: 8-22 mins PT General  Charges $$ ACUTE PT VISIT: 1 Visit                     Jamesetta Geralds, PT, DPT [facility] Rehabilitation Department Office: [phone]   Jamesetta Geralds 09/07/2022, 2:40 PM

## 2022-09-07 NOTE — Progress Notes (Signed)
PROGRESS NOTE    Tony [name]  ZOX:[id number] DOB: 1945-10-11 DOA: 09/04/2022 PCP: Chilton Greathouse, MD  Chief Complaint  Patient presents with   Fall    Brief Narrative:    Tony [name] is Tony [name] 77 y.o. male with medical history significant for Parkinson's disease with associated dementia and unsteady gait who fell at home 7/10.  He was found to have 3 rib fractures.  Assessment & Plan:   Principal Problem:   Multiple rib fractures Active Problems:   Parkinsonism   Cognitive impairment   Intractable pain  Right 10-12th Rib Fractures Minimally Displaced R 10-11th Rib Fracture Nondisplaced R 12th Rib Fracture Pain  management Mobilize, IS CXR without focal infiltrates Appreciate surgery recs  Anemia AOCD Normal iron, b12, folate ferritin Will monitor  Parkison's Disease Dementia Sinemet Aricept Delirium precautions  Depression  Anxiety Lexapro  Insomnia Trazodone, melatonin  Dyslipidemia Statin  BPH Noted on imaging    DVT prophylaxis: heparin Code Status: full Family Communication: daughter, wife Disposition:   Status is: Inpatient Remains inpatient appropriate because: awaiting placement   Consultants:  surgery  Procedures:  none  Antimicrobials:  Anti-infectives (From admission, onward)    None       Subjective: C/o issues sleeping   Objective: Vitals:   09/06/22 0421 09/06/22 1401 09/07/22 0334 09/07/22 0629  BP: 120/78 108/71 120/70 136/72  Pulse: 68 88 63 61  Resp: 18 18 18 16   Temp: 97.8 F (36.6 C) 98.6 F (37 C)  97.9 F (36.6 C)  TempSrc:    Oral  SpO2: 100% 98% 99% 100%  Weight:      Height:        Intake/Output Summary (Last 24 hours) at 09/07/2022 1258 Last data filed at 09/07/2022 0600 Gross per 24 hour  Intake 360 ml  Output --  Net 360 ml   Filed Weights   09/04/22 1537  Weight: 91.6 kg    Examination:  General: No acute distress. Cardiovascular: RRR Lungs: unlabored Abdomen: Soft,  nontender, nondistended  Bruising to lower back on R Neurological: Alert pleasantly confuse. Moves all extremities 4 with equal strength. Cranial nerves II through XII grossly intact. Extremities: No clubbing or cyanosis. No edema.     Data Reviewed: I have personally reviewed following labs and imaging studies  CBC: Recent Labs  Lab 09/04/22 1826 09/04/22 2000 09/06/22 0334 09/06/22 1201  WBC  --  9.7 5.2  --   NEUTROABS  --  7.3  --   --   HGB 11.6* 10.3* 9.4* 10.6*  HCT 34.0* 32.0* 30.1* 34.1*  MCV  --  64.9* 67.0*  --   PLT  --  194 171  --     Basic Metabolic Panel: Recent Labs  Lab 09/04/22 1826 09/05/22 0345 09/06/22 0334  NA 139 137 136  K 3.9 3.8 3.6  CL 103 104 105  CO2  --  27 24  GLUCOSE 112* 98 99  BUN 13 17 21   CREATININE 1.00 1.05 0.87  CALCIUM  --  8.9 8.6*  MG  --   --  1.6*  PHOS  --   --  4.3    GFR: Estimated Creatinine Clearance: 75.6 mL/min (by C-G formula based on SCr of 0.87 mg/dL).  Liver Function Tests: Recent Labs  Lab 09/05/22 0345  AST 12*  ALT 20  ALKPHOS 70  BILITOT 0.9  PROT 6.7  ALBUMIN 3.6    CBG: No results for input(s): "GLUCAP" in the last 168 hours.  No results found for this or any previous visit (from the past 240 hour(s)).       Radiology Studies: No results found.      Scheduled Meds:  acetaminophen  1,000 mg Oral Q8H   atorvastatin  20 mg Oral QPM   carbidopa-levodopa  2 tablet Oral 3 times per day   donepezil  10 mg Oral Daily   escitalopram  20 mg Oral Daily   heparin  5,000 Units Subcutaneous Q8H   ibuprofen  400 mg Oral Q6H   lidocaine  3 patch Transdermal Q24H   loratadine  10 mg Oral Daily   methocarbamol  500 mg Oral TID   polyethylene glycol  17 g Oral BID   traZODone  50 mg Oral QHS   Continuous Infusions:     LOS: 3 days    Time spent: over 30 min    Lacretia Nicks, MD [facility]   To contact the attending provider between 7A-7P or the covering  provider during after hours 7P-7A, please log into the web site [email or web address] and access using universal [place] password for that web site. If you do not have the password, please call the hospital operator.  09/07/2022, 12:58 PM

## 2022-09-07 NOTE — TOC Progression Note (Signed)
Transition of Care [facility]) - Progression Note    Patient Details  Name: Tony [name] MRN: [id number] Date of Birth: [birth year]  Transition of Care [facility]) CM/SW Contact  Adrian Prows, RN Phone Number: 09/07/2022, 9:33 AM  Clinical Narrative:    Notified by Junious Dresser from [employer] ins auth received; she gave approval # [id number]; she says pt has 5 business days to complete transfer; she also gave ambulance auth # [id number], good for 90 days.   Expected Discharge Plan: Skilled Nursing Facility Barriers to Discharge: Continued Medical Work up, English as a second language teacher  Expected Discharge Plan and Services In-house Referral: Clinical Social Work   Post Acute Care Choice: Skilled Nursing Facility Living arrangements for the past 2 months: Assisted Living Facility ([facility])                 DME Arranged: N/A DME Agency: NA                   Social Determinants of Health (SDOH) Interventions SDOH Screenings   Food Insecurity: No Food Insecurity (09/04/2022)  Housing: Low Risk  (09/04/2022)  Transportation Needs: No Transportation Needs (09/04/2022)  Utilities: Not At Risk (09/04/2022)  Tobacco Use: High Risk (09/04/2022)    Readmission Risk Interventions    09/05/2022   11:23 AM  Readmission Risk Prevention Plan  Post Dischage Appt Complete  Medication Screening Complete  Transportation Screening Complete

## 2022-09-08 DIAGNOSIS — S2241XA Multiple fractures of ribs, right side, initial encounter for closed fracture: Secondary | ICD-10-CM | POA: Diagnosis not present

## 2022-09-08 NOTE — Progress Notes (Signed)
PROGRESS NOTE    Tony [name]  ZOX:[id number] DOB: [birth year] DOA: 09/04/2022 PCP: Chilton Greathouse, MD  Chief Complaint  Patient presents with   Fall    Brief Narrative:    Tony [name] is Tony [name] 77 y.o. male with medical history significant for Parkinson's disease with associated dementia and unsteady gait who fell at home 7/10.  He was found to have 3 rib fractures.  Assessment & Plan:   Principal Problem:   Multiple rib fractures Active Problems:   Parkinsonism   Cognitive impairment   Intractable pain  Right 10-12th Rib Fractures Minimally Displaced R 10-11th Rib Fracture Nondisplaced R 12th Rib Fracture Pain  management Mobilize, IS CXR without focal infiltrates Appreciate surgery recs  Anemia AOCD Normal iron, b12, folate ferritin Will monitor  Parkison's Disease Dementia Sinemet Aricept Delirium precautions  Depression  Anxiety Lexapro  Insomnia Trazodone, melatonin  Dyslipidemia Statin  BPH Noted on imaging    DVT prophylaxis: heparin Code Status: full Family Communication: none Disposition:   Status is: Inpatient Remains inpatient appropriate because: awaiting placement - hopefully d/c 7/15   Consultants:  surgery  Procedures:  none  Antimicrobials:  Anti-infectives (From admission, onward)    None       Subjective: Wants to get up to chair   Objective: Vitals:   09/06/22 1401 09/07/22 0334 09/07/22 0629 09/08/22 0510  BP: 108/71 120/70 136/72 128/87  Pulse: 88 63 61 65  Resp: 18 18 16 18   Temp: 98.6 F (37 C)  97.9 F (36.6 C) 98 F (36.7 C)  TempSrc:   Oral   SpO2: 98% 99% 100% 95%  Weight:      Height:        Intake/Output Summary (Last 24 hours) at 09/08/2022 1022 Last data filed at 09/08/2022 0842 Gross per 24 hour  Intake 360 ml  Output --  Net 360 ml   Filed Weights   09/04/22 1537  Weight: 91.6 kg    Examination:  General: No acute distress. Cardiovascular: RRR Lungs:  unlabored Neurological: Alert . Moves all extremities 4 with equal strength. Cranial nerves II through XII grossly intact. Extremities: No clubbing or cyanosis. No edema.   Data Reviewed: I have personally reviewed following labs and imaging studies  CBC: Recent Labs  Lab 09/04/22 1826 09/04/22 2000 09/06/22 0334 09/06/22 1201  WBC  --  9.7 5.2  --   NEUTROABS  --  7.3  --   --   HGB 11.6* 10.3* 9.4* 10.6*  HCT 34.0* 32.0* 30.1* 34.1*  MCV  --  64.9* 67.0*  --   PLT  --  194 171  --     Basic Metabolic Panel: Recent Labs  Lab 09/04/22 1826 09/05/22 0345 09/06/22 0334  NA 139 137 136  K 3.9 3.8 3.6  CL 103 104 105  CO2  --  27 24  GLUCOSE 112* 98 99  BUN 13 17 21   CREATININE 1.00 1.05 0.87  CALCIUM  --  8.9 8.6*  MG  --   --  1.6*  PHOS  --   --  4.3    GFR: Estimated Creatinine Clearance: 75.6 mL/min (by C-G formula based on SCr of 0.87 mg/dL).  Liver Function Tests: Recent Labs  Lab 09/05/22 0345  AST 12*  ALT 20  ALKPHOS 70  BILITOT 0.9  PROT 6.7  ALBUMIN 3.6    CBG: No results for input(s): "GLUCAP" in the last 168 hours.   No results found for this or any  previous visit (from the past 240 hour(s)).       Radiology Studies: No results found.      Scheduled Meds:  acetaminophen  1,000 mg Oral Q8H   atorvastatin  20 mg Oral QPM   carbidopa-levodopa  2 tablet Oral 3 times per day   donepezil  10 mg Oral Daily   escitalopram  20 mg Oral Daily   heparin  5,000 Units Subcutaneous Q8H   ibuprofen  400 mg Oral Q6H   lidocaine  3 patch Transdermal Q24H   loratadine  10 mg Oral Daily   methocarbamol  500 mg Oral TID   polyethylene glycol  17 g Oral BID   traZODone  50 mg Oral QHS   Continuous Infusions:     LOS: 4 days    Time spent: over 30 min    Lacretia Nicks, MD [facility]   To contact the attending provider between 7A-7P or the covering provider during after hours 7P-7A, please log into the web site  [email or web address] and access using universal [place] password for that web site. If you do not have the password, please call the hospital operator.  09/08/2022, 10:22 AM

## 2022-09-08 NOTE — Plan of Care (Signed)
  Problem: Pain Managment: Goal: General experience of comfort will improve Outcome: Progressing   Problem: Safety: Goal: Ability to remain free from injury will improve Outcome: Progressing   Problem: Coping: Goal: Level of anxiety will decrease Outcome: Progressing   

## 2022-09-09 DIAGNOSIS — S2241XA Multiple fractures of ribs, right side, initial encounter for closed fracture: Secondary | ICD-10-CM | POA: Diagnosis not present

## 2022-09-09 DIAGNOSIS — F0390 Unspecified dementia without behavioral disturbance: Secondary | ICD-10-CM | POA: Diagnosis not present

## 2022-09-09 DIAGNOSIS — S2241XD Multiple fractures of ribs, right side, subsequent encounter for fracture with routine healing: Secondary | ICD-10-CM | POA: Diagnosis not present

## 2022-09-09 DIAGNOSIS — F0280 Dementia in other diseases classified elsewhere without behavioral disturbance: Secondary | ICD-10-CM | POA: Diagnosis not present

## 2022-09-09 DIAGNOSIS — G4700 Insomnia, unspecified: Secondary | ICD-10-CM | POA: Diagnosis not present

## 2022-09-09 DIAGNOSIS — K5730 Diverticulosis of large intestine without perforation or abscess without bleeding: Secondary | ICD-10-CM | POA: Diagnosis not present

## 2022-09-09 DIAGNOSIS — N400 Enlarged prostate without lower urinary tract symptoms: Secondary | ICD-10-CM | POA: Diagnosis not present

## 2022-09-09 DIAGNOSIS — R4189 Other symptoms and signs involving cognitive functions and awareness: Secondary | ICD-10-CM | POA: Diagnosis not present

## 2022-09-09 DIAGNOSIS — Z9181 History of falling: Secondary | ICD-10-CM | POA: Diagnosis not present

## 2022-09-09 DIAGNOSIS — G20A1 Parkinson's disease without dyskinesia, without mention of fluctuations: Secondary | ICD-10-CM | POA: Diagnosis not present

## 2022-09-09 DIAGNOSIS — E785 Hyperlipidemia, unspecified: Secondary | ICD-10-CM | POA: Diagnosis not present

## 2022-09-09 DIAGNOSIS — D649 Anemia, unspecified: Secondary | ICD-10-CM | POA: Diagnosis not present

## 2022-09-09 DIAGNOSIS — F419 Anxiety disorder, unspecified: Secondary | ICD-10-CM | POA: Diagnosis not present

## 2022-09-09 DIAGNOSIS — R4182 Altered mental status, unspecified: Secondary | ICD-10-CM | POA: Diagnosis not present

## 2022-09-09 DIAGNOSIS — R2681 Unsteadiness on feet: Secondary | ICD-10-CM | POA: Diagnosis not present

## 2022-09-09 DIAGNOSIS — M6281 Muscle weakness (generalized): Secondary | ICD-10-CM | POA: Diagnosis not present

## 2022-09-09 DIAGNOSIS — R2689 Other abnormalities of gait and mobility: Secondary | ICD-10-CM | POA: Diagnosis not present

## 2022-09-09 DIAGNOSIS — Z7401 Bed confinement status: Secondary | ICD-10-CM | POA: Diagnosis not present

## 2022-09-09 DIAGNOSIS — F411 Generalized anxiety disorder: Secondary | ICD-10-CM | POA: Diagnosis not present

## 2022-09-09 DIAGNOSIS — R471 Dysarthria and anarthria: Secondary | ICD-10-CM | POA: Diagnosis not present

## 2022-09-09 DIAGNOSIS — S2249XD Multiple fractures of ribs, unspecified side, subsequent encounter for fracture with routine healing: Secondary | ICD-10-CM | POA: Diagnosis not present

## 2022-09-09 LAB — MAGNESIUM: Magnesium: 1.4 mg/dL — ABNORMAL LOW (ref 1.7–2.4)

## 2022-09-09 LAB — BASIC METABOLIC PANEL
Anion gap: 7 (ref 5–15)
BUN: 20 mg/dL (ref 8–23)
CO2: 23 mmol/L (ref 22–32)
Calcium: 8.5 mg/dL — ABNORMAL LOW (ref 8.9–10.3)
Chloride: 104 mmol/L (ref 98–111)
Creatinine, Ser: 0.85 mg/dL (ref 0.61–1.24)
GFR, Estimated: >60 mL/min (ref >60)
Glucose, Bld: 148 mg/dL — ABNORMAL HIGH (ref 70–99)
Potassium: 3.9 mmol/L (ref 3.5–5.1)
Sodium: 134 mmol/L — ABNORMAL LOW (ref 135–145)

## 2022-09-09 LAB — CBC
HCT: 32.7 % — ABNORMAL LOW (ref 39.0–52.0)
Hemoglobin: 10.2 g/dL — ABNORMAL LOW (ref 13.0–17.0)
MCH: 20.6 pg — ABNORMAL LOW (ref 26.0–34.0)
MCHC: 31.2 g/dL (ref 30.0–36.0)
MCV: 66.2 fL — ABNORMAL LOW (ref 80.0–100.0)
Platelets: 187 10*3/uL (ref 150–400)
RBC: 4.94 MIL/uL (ref 4.22–5.81)
RDW: 15.5 % (ref 11.5–15.5)
WBC: 6.2 10*3/uL (ref 4.0–10.5)
nRBC: 0 % (ref 0.0–0.2)

## 2022-09-09 LAB — PHOSPHORUS: Phosphorus: 2.5 mg/dL (ref 2.5–4.6)

## 2022-09-09 MED ORDER — LIDOCAINE 5 % EX PTCH: 3.0000 | MEDICATED_PATCH | CUTANEOUS | Status: DC

## 2022-09-09 MED ORDER — MAGNESIUM SULFATE 4 GM/100ML IV SOLN
4.0000 g | Freq: Once | INTRAVENOUS | Status: AC
  Administered 2022-09-09: 4 g via INTRAVENOUS
  Filled 2022-09-09: qty 100

## 2022-09-09 MED ORDER — DICLOFENAC SODIUM 1 % EX GEL: 2.0000 g | Freq: Four times a day (QID) | CUTANEOUS | Status: DC | PRN

## 2022-09-09 MED ORDER — POLYETHYLENE GLYCOL 3350 17 G PO PACK: 17.0000 g | PACK | Freq: Every day | ORAL | 0 refills | Status: AC

## 2022-09-09 MED ORDER — ACETAMINOPHEN 325 MG PO TABS: 650.0000 mg | ORAL_TABLET | Freq: Four times a day (QID) | ORAL | Status: AC | PRN

## 2022-09-09 MED ORDER — OXYCODONE HCL 5 MG PO TABS: 2.5000 mg | ORAL_TABLET | Freq: Four times a day (QID) | ORAL | 0 refills | Status: AC | PRN

## 2022-09-09 NOTE — Discharge Summary (Signed)
Physician Discharge Summary  Tony [name] ZOX:[id number] DOB: Oct 26, 1945 DOA: 09/04/2022  PCP: Tony Greathouse, MD  Admit date: 09/04/2022 Discharge date: 09/09/2022  Time spent: 40 minutes  Recommendations for Outpatient Follow-up:  Follow outpatient CBC/CMP  Follow pain control - use tylenol, lidocaine patches, voltaren - oxycodone for pain not improved with those BPH on imaging, follow symptoms outpatient, consider urology follow up    Discharge Diagnoses:  Principal Problem:   Multiple rib fractures Active Problems:   Parkinsonism   Cognitive impairment   Intractable pain   Discharge Condition: stable  Diet recommendation: heart healthy  Filed Weights   09/04/22 1537  Weight: 91.6 kg    History of present illness:   Tony [name] is Tony [name] 77 y.o. male with medical history significant for Parkinson's disease with associated dementia and unsteady gait who fell at home 7/10.  He was found to have 3 rib fractures.  Pain improved.  Plan to discharge to SNF for rehab.  See below for additional details  Hospital Course:  Assessment and Plan:  Right 10-12th Rib Fractures Minimally Displaced R 10-11th Rib Fracture Nondisplaced R 12th Rib Fracture Pain  management Mobilize, IS CXR without focal infiltrates Appreciate surgery recs - ok to follow with PCP outpatient   Anemia AOCD Normal iron, b12, folate ferritin Follow outpatient   Parkison's Disease Dementia Sinemet Aricept Delirium precautions  Depression  Anxiety Lexapro   Insomnia Trazodone, melatonin   Dyslipidemia Statin  BPH Noted on imaging Follow outpatient symptoms as needed, consider urology follow up    Procedures: none   Consultations: surgery  Discharge Exam: Vitals:   09/08/22 2015 09/09/22 0517  BP: 129/79 (!) 143/85  Pulse: 71 69  Resp: 17 16  Temp: 98.6 F (37 C) 98.6 F (37 C)  SpO2: 95% 100%   No complaints Wife at bedside  General: No acute  distress. Cardiovascular: RRR Lungs: unlabored Neurological: Alert . Moves all extremities 4 with equal strength. Cranial nerves II through XII grossly intact. Extremities: No clubbing or cyanosis. No edema.  Discharge Instructions   Discharge Instructions     Call MD for:  difficulty breathing, headache or visual disturbances   Complete by: As directed    Call MD for:  extreme fatigue   Complete by: As directed    Call MD for:  hives   Complete by: As directed    Call MD for:  persistant dizziness or light-headedness   Complete by: As directed    Call MD for:  persistant nausea and vomiting   Complete by: As directed    Call MD for:  redness, tenderness, or signs of infection (pain, swelling, redness, odor or green/yellow discharge around incision site)   Complete by: As directed    Call MD for:  severe uncontrolled pain   Complete by: As directed    Call MD for:  temperature >100.4   Complete by: As directed    Diet - low sodium heart healthy   Complete by: As directed    Discharge instructions   Complete by: As directed    You were seen for rib fractures after Tony [name] fall.  Continue pain control with tylenol, lidocaine patches, voltaren gel as needed.  Use opiates for pain not resolved with other measures.  Continue to use your incentive spirometer to help keep your lungs open.    You had an enlarged prostate on imaging.  If you have issues with urination, please let your doctor know, it may be worth trying Tony [name]  medicine called flomax or having urology follow up.  Follow repeat labs within the next week or so at the skilled nursing facility.  Return for new, recurrent, or worsening symptoms.  Please ask your PCP to request records from this hospitalization so they know what was done and what the next steps will be.   Increase activity slowly   Complete by: As directed       Allergies as of 09/09/2022       Reactions   Shellfish-derived Products Hives, Swelling         Medication List     STOP taking these medications    citalopram 10 MG tablet Commonly known as: CeleXA       TAKE these medications    acetaminophen 325 MG tablet Commonly known as: Tylenol Take 2 tablets (650 mg total) by mouth every 6 (six) hours as needed.   atorvastatin 20 MG tablet Commonly known as: LIPITOR Take 20 mg by mouth every evening.   carbidopa-levodopa 25-100 MG tablet Commonly known as: SINEMET IR Take 2 tablets by mouth 3 (three) times daily. What changed:  when to take this additional instructions   CertaVite Senior/Antioxidant Tabs Take 1 tablet by mouth daily with breakfast.   cetirizine 10 MG tablet Commonly known as: ZYRTEC Take 10 mg by mouth daily.   donepezil 10 MG tablet Commonly known as: ARICEPT Take 1 tablet (10 mg total) by mouth in the morning.   escitalopram 20 MG tablet Commonly known as: LEXAPRO Take 20 mg by mouth daily.   lidocaine 5 % Commonly known as: LIDODERM Place 3 patches onto the skin daily. Apply to rib pain.  Remove & Discard patch within 12 hours or as directed by MD   oxyCODONE 5 MG immediate release tablet Commonly known as: Oxy IR/ROXICODONE Take 0.5-1 tablets (2.5-5 mg total) by mouth every 6 (six) hours as needed for up to 3 days for moderate pain or severe pain.   polyethylene glycol 17 g packet Commonly known as: MIRALAX / GLYCOLAX Take 17 g by mouth daily. Titrate to 1 soft bowel movement daily   traZODone 50 MG tablet Commonly known as: DESYREL Take 50 mg by mouth at bedtime.       Allergies  Allergen Reactions   Shellfish-Derived Products Hives and Swelling    Contact information for after-discharge care     Destination     [facility]erred [facility] .   Service: Skilled Nursing Contact information: [address] Test Update Address [place] [address] [phone]                      The results of significant diagnostics from this hospitalization  (including imaging, microbiology, ancillary and laboratory) are listed below for reference.    Significant Diagnostic Studies: DG CHEST PORT 1 VIEW  Result Date: 09/05/2022 CLINICAL DATA:  Right rib fractures EXAM: PORTABLE CHEST 1 VIEW COMPARISON:  Previous studies done on 09/04/2022 FINDINGS: Cardiac size is within normal limits. There are no signs of pulmonary edema or focal pulmonary consolidation. There is no definite pleural effusion or pneumothorax. Skin fold is noted overlying the lateral aspect of right lower lung field. Slightly displaced fracture is seen in the lateral aspect of right tenth rib. Fractures of right eleventh and twelfth ribs are not optimally visualized in the current study. IMPRESSION: There are no focal infiltrates or signs of pulmonary edema. There is no significant pleural effusion or pneumothorax. Air soft tissue interface seen  in the lateral aspect of right lower lung field may be caused by his skin fold. Electronically Signed   By: Ernie Avena M.D.   On: 09/05/2022 09:02   CT ABDOMEN PELVIS WO CONTRAST  Result Date: 09/04/2022 CLINICAL DATA:  Trauma. EXAM: CT CHEST, ABDOMEN AND PELVIS WITHOUT CONTRAST TECHNIQUE: Multidetector CT imaging of the chest, abdomen and pelvis was performed following the standard protocol without IV contrast. RADIATION DOSE REDUCTION: This exam was performed according to the departmental dose-optimization program which includes automated exposure control, adjustment of the mA and/or kV according to patient size and/or use of iterative reconstruction technique. COMPARISON:  None Available. FINDINGS: Evaluation of this exam is limited in the absence of intravenous contrast. CT CHEST FINDINGS Cardiovascular: There is no cardiomegaly or pericardial effusion. There is 3 vessel coronary vascular calcification. Mild atherosclerotic calcification of the thoracic aorta. No aneurysmal dilatation. The central pulmonary arteries are grossly unremarkable  on this noncontrast CT. Mediastinum/Nodes: No hilar or mediastinal adenopathy. Small hiatal hernia. The esophagus is grossly unremarkable. Asymmetric enlargement of the left thyroid lobe with Ventura Leggitt 3.5 cm nodule. Recommend thyroid US (ref: J Am Coll Radiol. 2015 Feb;12(2): 143-50).No mediastinal fluid collection. Lungs/Pleura: No focal consolidation, pleural effusion, or pneumothorax. The central airways are patent. Musculoskeletal: Osteopenia with degenerative changes of the spine. Mildly displaced fractures of the posterior right 10th-12th ribs. CT ABDOMEN PELVIS FINDINGS No intra-abdominal free air or free fluid. Hepatobiliary: The liver is unremarkable. No biliary dilatation. The gallbladder is unremarkable. Pancreas: Unremarkable. No pancreatic ductal dilatation or surrounding inflammatory changes. Spleen: Normal in size without focal abnormality. Adrenals/Urinary Tract: The adrenal glands are unremarkable. The kidneys, and the visualized ureters appear unremarkable. There is trabeculated appearance of the bladder wall suggestive of chronic bladder outlet obstruction. Stomach/Bowel: Moderate stool throughout the colon. There is sigmoid diverticulosis without active inflammatory changes. There is no bowel obstruction or active inflammation. The appendix is normal. Vascular/Lymphatic: Moderate aortoiliac atherosclerotic disease. The IVC is unremarkable. No portal venous gas. There is no adenopathy. Reproductive: Enlarged prostate gland measuring 5.7 cm in transverse axial diameter. The seminal vesicles are symmetric. Other: Small fat containing umbilical hernia. Musculoskeletal: Osteopenia with degenerative changes of the spine. No acute osseous pathology. Indeterminate 3.4 x 2.1 cm low attenuating, possibly cystic nodule in the subcutaneous soft tissues of the left anterior pelvic wall may represent Lemuel Boodram sebaceous cyst. Further evaluation with ultrasound on Cherilyn Sautter nonemergent/outpatient basis recommended. IMPRESSION: 1.  Mildly displaced fractures of the posterior right 10th-12th ribs. No pneumothorax. 2. No acute/traumatic intra-abdominal or pelvic pathology. 3. Sigmoid diverticulosis. No bowel obstruction. Normal appendix. 4. 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 5.  Aortic Atherosclerosis (ICD10-I70.0). Electronically Signed   By: Elgie Collard M.D.   On: 09/04/2022 18:29   CT CHEST WO CONTRAST  Result Date: 09/04/2022 CLINICAL DATA:  Trauma. EXAM: CT CHEST, ABDOMEN AND PELVIS WITHOUT CONTRAST TECHNIQUE: Multidetector CT imaging of the chest, abdomen and pelvis was performed following the standard protocol without IV contrast. RADIATION DOSE REDUCTION: This exam was performed according to the departmental dose-optimization program which includes automated exposure control, adjustment of the mA and/or kV according to patient size and/or use of iterative reconstruction technique. COMPARISON:  None Available. FINDINGS: Evaluation of this exam is limited in the absence of intravenous contrast. CT CHEST FINDINGS Cardiovascular: There is no cardiomegaly or pericardial effusion. There is 3 vessel coronary vascular calcification. Mild atherosclerotic calcification of the thoracic aorta. No aneurysmal dilatation. The central pulmonary arteries are grossly unremarkable  on this noncontrast CT. Mediastinum/Nodes: No hilar or mediastinal adenopathy. Small hiatal hernia. The esophagus is grossly unremarkable. Asymmetric enlargement of the left thyroid lobe with Iria Jamerson 3.5 cm nodule. Recommend thyroid US (ref: J Am Coll Radiol. 2015 Feb;12(2): 143-50).No mediastinal fluid collection. Lungs/Pleura: No focal consolidation, pleural effusion, or pneumothorax. The central airways are patent. Musculoskeletal: Osteopenia with degenerative changes of the spine. Mildly displaced fractures of the posterior right 10th-12th ribs. CT ABDOMEN PELVIS FINDINGS No intra-abdominal free air or free fluid. Hepatobiliary:  The liver is unremarkable. No biliary dilatation. The gallbladder is unremarkable. Pancreas: Unremarkable. No pancreatic ductal dilatation or surrounding inflammatory changes. Spleen: Normal in size without focal abnormality. Adrenals/Urinary Tract: The adrenal glands are unremarkable. The kidneys, and the visualized ureters appear unremarkable. There is trabeculated appearance of the bladder wall suggestive of chronic bladder outlet obstruction. Stomach/Bowel: Moderate stool throughout the colon. There is sigmoid diverticulosis without active inflammatory changes. There is no bowel obstruction or active inflammation. The appendix is normal. Vascular/Lymphatic: Moderate aortoiliac atherosclerotic disease. The IVC is unremarkable. No portal venous gas. There is no adenopathy. Reproductive: Enlarged prostate gland measuring 5.7 cm in transverse axial diameter. The seminal vesicles are symmetric. Other: Small fat containing umbilical hernia. Musculoskeletal: Osteopenia with degenerative changes of the spine. No acute osseous pathology. Indeterminate 3.4 x 2.1 cm low attenuating, possibly cystic nodule in the subcutaneous soft tissues of the left anterior pelvic wall may represent Zyler Hyson sebaceous cyst. Further evaluation with ultrasound on Gyanna Jarema nonemergent/outpatient basis recommended. IMPRESSION: 1. Mildly displaced fractures of the posterior right 10th-12th ribs. No pneumothorax. 2. No acute/traumatic intra-abdominal or pelvic pathology. 3. Sigmoid diverticulosis. No bowel obstruction. Normal appendix. 4. 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 5.  Aortic Atherosclerosis (ICD10-I70.0). Electronically Signed   By: Elgie Collard M.D.   On: 09/04/2022 18:29   CT Head Wo Contrast  Result Date: 09/04/2022 CLINICAL DATA:  Head trauma, minor (Age >= 65y), fall EXAM: CT HEAD WITHOUT CONTRAST TECHNIQUE: Contiguous axial images were obtained from the base of the skull through the vertex  without intravenous contrast. RADIATION DOSE REDUCTION: This exam was performed according to the departmental dose-optimization program which includes automated exposure control, adjustment of the mA and/or kV according to patient size and/or use of iterative reconstruction technique. COMPARISON:  None Available. FINDINGS: Brain: Diffuse cerebral atrophy. No acute intracranial abnormality. Specifically, no hemorrhage, hydrocephalus, mass lesion, acute infarction, or significant intracranial injury. Vascular: No hyperdense vessel or unexpected calcification. Skull: No acute calvarial abnormality. Sinuses/Orbits: Mucosal thickening throughout the right paranasal sinuses. No air-fluid levels. Other: None IMPRESSION: Diffuse cerebral atrophy.  No acute intracranial abnormality. Right chronic sinusitis. Electronically Signed   By: Charlett Nose M.D.   On: 09/04/2022 18:15   DG Ribs Unilateral W/Chest Right  Result Date: 09/04/2022 CLINICAL DATA:  fall EXAM: RIGHT RIBS AND CHEST - 3+ VIEW COMPARISON:  September 11, 2006 FINDINGS: There is Mirabella Hilario minimally displaced fracture of the RIGHT tenth and eleventh ribs. Nondisplaced fracture of the RIGHT twelfth rib. No pneumothorax or pleural effusion. Atherosclerotic calcifications of the aorta. IMPRESSION: Minimally displaced fractures of the RIGHT tenth and eleventh ribs. Nondisplaced fracture of the RIGHT twelfth rib. Electronically Signed   By: Meda Klinefelter M.D.   On: 09/04/2022 18:03    Microbiology: No results found for this or any previous visit (from the past 240 hour(s)).   Labs: Basic Metabolic Panel: Recent Labs  Lab 09/04/22 1826 09/05/22 0345 09/06/22 0334 09/09/22 0842  NA 139 137 136  134*  K 3.9 3.8 3.6 3.9  CL 103 104 105 104  CO2  --  27 24 23   GLUCOSE 112* 98 99 148*  BUN 13 17 21 20   CREATININE 1.00 1.05 0.87 0.85  CALCIUM  --  8.9 8.6* 8.5*  MG  --   --  1.6* 1.4*  PHOS  --   --  4.3 2.5   Liver Function Tests: Recent Labs  Lab  09/05/22 0345  AST 12*  ALT 20  ALKPHOS 70  BILITOT 0.9  PROT 6.7  ALBUMIN 3.6   No results for input(s): "LIPASE", "AMYLASE" in the last 168 hours. No results for input(s): "AMMONIA" in the last 168 hours. CBC: Recent Labs  Lab 09/04/22 1826 09/04/22 2000 09/06/22 0334 09/06/22 1201 09/09/22 0842  WBC  --  9.7 5.2  --  6.2  NEUTROABS  --  7.3  --   --   --   HGB 11.6* 10.3* 9.4* 10.6* 10.2*  HCT 34.0* 32.0* 30.1* 34.1* 32.7*  MCV  --  64.9* 67.0*  --  66.2*  PLT  --  194 171  --  187   Cardiac Enzymes: No results for input(s): "CKTOTAL", "CKMB", "CKMBINDEX", "TROPONINI" in the last 168 hours. BNP: BNP (last 3 results) No results for input(s): "BNP" in the last 8760 hours.  ProBNP (last 3 results) No results for input(s): "PROBNP" in the last 8760 hours.  CBG: No results for input(s): "GLUCAP" in the last 168 hours.     Signed:  Lacretia Nicks MD.  [facility] 09/09/2022, 10:17 AM

## 2022-09-09 NOTE — Plan of Care (Signed)
  Problem: Coping: Goal: Level of anxiety will decrease Outcome: Progressing   Problem: Safety: Goal: Ability to remain free from injury will improve Outcome: Progressing   Problem: Pain Managment: Goal: General experience of comfort will improve Outcome: Progressing   

## 2022-09-09 NOTE — Progress Notes (Signed)
Physical Therapy Treatment Patient Details Name: Tony [name] MRN: [id number] DOB: 07/28/1945 Today's Date: 09/09/2022   History of Present Illness Tony [name] is a 77 y.o. male with medical history significant for Parkinson's disease with associated dementia and unsteady gait who fell at home 7/10.  He was found to have 3 rib fractures on RT side 10th-12th, mildly displaced.    PT Comments  General Comments: AxO x 3 appears close to prior level.  Pleasant. Stated [place]in the sixties".  Resides at [facility]F. Assisted OOB to amb in hallway was difficult.  General bed mobility comments: Assist with B LE off bed.  Also present with initial rigidity and posterior lean.  General transfer comment: Pt instructed in proper STS form, on first attempt pt did not acheive sufficient anterior weight shift and after clearing ITs sat back down in recliner. With additional multimodal cuing and anterior weight shift pt min guard and able to comlpete transfer. General Gait Details: Pt instructed in gait training with RW and min guard 34ft , festination, flexed posture, but with good proximityt of device. Pt will need ST Rehab at SNF to address mobility and functional decline prior to safely returning home.     Assistance Recommended at Discharge Frequent or constant Supervision/Assistance  If plan is discharge home, recommend the following:  Can travel by private vehicle    A little help with walking and/or transfers;A little help with bathing/dressing/bathroom;Assistance with cooking/housework;Assist for transportation   Yes  Equipment Recommendations  None recommended by PT    Recommendations for Other Services       Precautions / Restrictions Precautions Precautions: Fall Precaution Comments: Hx Parkinsons Restrictions Weight Bearing Restrictions: No     Mobility  Bed Mobility Overal bed mobility: Needs Assistance Bed Mobility: Supine to Sit      Supine to sit: Min assist, Mod assist     General bed mobility comments: Assist with B LE off bed.  Also present with initial rigidity and posterior lean    Transfers Overall transfer level: Needs assistance Equipment used: Rolling walker (2 wheels) Transfers: Sit to/from Stand Sit to Stand: Min assist           General transfer comment: Pt instructed in proper STS form, on first attempt pt did not acheive sufficient anterior weight shift and after clearing ITs sat back down in recliner. With additional multimodal cuing and anterior weight shift pt min guard and able to comlpete transfer.    Ambulation/Gait Ambulation/Gait assistance: Min guard, Min assist Gait Distance (Feet): 75 Feet Assistive device: Rolling walker (2 wheels) Gait Pattern/deviations: Step-to pattern, Step-through pattern, Decreased step length - right, Decreased step length - left, Shuffle, Trunk flexed, Festinating Gait velocity: decreased     General Gait Details: Pt instructed in gait training with RW and min guard 45ft , festination, flexed posture, but with good proximityt of device.   Stairs             Wheelchair Mobility     Tilt Bed    Modified Rankin (Stroke Patients Only)       Balance                                            Cognition Arousal/Alertness: Awake/alert   Overall Cognitive Status: Within Functional Limits for tasks assessed  General Comments: AxO x 3 appears close to prior level.  Pleasant. Stated [place] the sixties".  Resides at [facility].        Exercises      General Comments        Pertinent Vitals/Pain      Home Living                          Prior Function            PT Goals (current goals can now be found in the care plan section) Progress towards PT goals: Progressing toward goals    Frequency           PT Plan  Current plan remains appropriate    Co-evaluation              AM-PAC PT "6 Clicks" Mobility   Outcome Measure  Help needed turning from your back to your side while in a flat bed without using bedrails?: A Lot Help needed moving from lying on your back to sitting on the side of a flat bed without using bedrails?: A Lot Help needed moving to and from a bed to a chair (including a wheelchair)?: A Lot Help needed standing up from a chair using your arms (e.g., wheelchair or bedside chair)?: A Lot Help needed to walk in hospital room?: A Lot Help needed climbing 3-5 steps with a railing? : Total 6 Click Score: 11    End of Session Equipment Utilized During Treatment: Gait belt Activity Tolerance: Patient tolerated treatment well;No increased pain Patient left: in chair;with call bell/phone within reach;with chair alarm set;with family/visitor present Nurse Communication: Mobility status PT Visit Diagnosis: History of falling (Z91.81);Other symptoms and signs involving the nervous system (R29.898);Pain;Difficulty in walking, not elsewhere classified (R26.2);Muscle weakness (generalized) (M62.81) Pain - Right/Left: Right Pain - part of body: Hip     Time: 1205-1220 PT Time Calculation (min) (ACUTE ONLY): 15 min  Charges:    $Gait Training: 8-22 mins PT General Charges $$ ACUTE PT VISIT: 1 Visit                     Felecia Shelling  PTA Acute  Rehabilitation Services Office M-F          [phone]

## 2022-09-09 NOTE — TOC Transition Note (Signed)
Transition of Care [facility]) - CM/SW Discharge Note  Patient Details  Name: Tony [name] MRN: [id number] Date of Birth: [birth year]  [facility] CM/SW Contact:  Ewing Schlein, LCSW Phone Number: 09/09/2022, 12:02 PM  Clinical Narrative: [facility] has bed availability to admit today. Patient will go to room 409P and the number for report is [phone]. Discharge summary, discharge orders, and SNF transfer report faxed to facility in hub. CSW updated patient, wife, and daughter regarding discharge and insurance approval. Medical necessity form done; PTAR scheduled. Discharge packet completed. RN updated. TOC signing off.  Final next level of care: Skilled Nursing Facility Barriers to Discharge: Barriers Resolved  Patient Goals and CMS Choice CMS Medicare.gov Compare Post Acute Care list provided to:: Patient Represenative (must comment) Choice offered to / list presented to : Spouse  Discharge Placement PASRR number recieved: 09/06/22         Patient chooses bed at: [facility] Patient to be transferred to facility by: PTAR Name of family member notified: Blaire Hodsdon (spouse) Patient and family notified of of transfer: 09/09/22  Discharge Plan and Services Additional resources added to the After Visit Summary for   In-house Referral: Clinical Social Work Post Acute Care Choice: Skilled Nursing Facility          DME Arranged: N/A DME Agency: NA  Social Determinants of Health (SDOH) Interventions SDOH Screenings   Food Insecurity: No Food Insecurity (09/04/2022)  Housing: Low Risk  (09/04/2022)  Transportation Needs: No Transportation Needs (09/04/2022)  Utilities: Not At Risk (09/04/2022)  Tobacco Use: High Risk (09/04/2022)   Readmission Risk Interventions    09/05/2022   11:23 AM  Readmission Risk Prevention Plan  Post Dischage Appt Complete  Medication Screening Complete  Transportation Screening Complete

## 2022-09-10 DIAGNOSIS — D649 Anemia, unspecified: Secondary | ICD-10-CM | POA: Diagnosis not present

## 2022-09-10 DIAGNOSIS — F0390 Unspecified dementia without behavioral disturbance: Secondary | ICD-10-CM

## 2022-09-10 DIAGNOSIS — G4700 Insomnia, unspecified: Secondary | ICD-10-CM

## 2022-09-10 DIAGNOSIS — G20A1 Parkinson's disease without dyskinesia, without mention of fluctuations: Secondary | ICD-10-CM | POA: Diagnosis not present

## 2022-09-10 DIAGNOSIS — F419 Anxiety disorder, unspecified: Secondary | ICD-10-CM

## 2022-09-10 DIAGNOSIS — N400 Enlarged prostate without lower urinary tract symptoms: Secondary | ICD-10-CM | POA: Diagnosis not present

## 2022-09-10 DIAGNOSIS — E785 Hyperlipidemia, unspecified: Secondary | ICD-10-CM | POA: Diagnosis not present

## 2022-09-10 DIAGNOSIS — M6281 Muscle weakness (generalized): Secondary | ICD-10-CM

## 2022-09-11 ENCOUNTER — Ambulatory Visit: Payer: PPO | Admitting: Physical Therapy

## 2022-09-11 ENCOUNTER — Encounter: Payer: PPO | Admitting: Speech Pathology

## 2022-09-11 ENCOUNTER — Encounter: Payer: PPO | Admitting: Occupational Therapy

## 2022-09-12 DIAGNOSIS — G20A1 Parkinson's disease without dyskinesia, without mention of fluctuations: Secondary | ICD-10-CM | POA: Diagnosis not present

## 2022-09-12 DIAGNOSIS — Z9181 History of falling: Secondary | ICD-10-CM | POA: Diagnosis not present

## 2022-09-12 DIAGNOSIS — F419 Anxiety disorder, unspecified: Secondary | ICD-10-CM | POA: Diagnosis not present

## 2022-09-12 DIAGNOSIS — R4189 Other symptoms and signs involving cognitive functions and awareness: Secondary | ICD-10-CM | POA: Diagnosis not present

## 2022-09-12 DIAGNOSIS — S2249XD Multiple fractures of ribs, unspecified side, subsequent encounter for fracture with routine healing: Secondary | ICD-10-CM | POA: Diagnosis not present

## 2022-09-16 DIAGNOSIS — G4700 Insomnia, unspecified: Secondary | ICD-10-CM | POA: Diagnosis not present

## 2022-09-16 DIAGNOSIS — F411 Generalized anxiety disorder: Secondary | ICD-10-CM | POA: Diagnosis not present

## 2022-09-16 DIAGNOSIS — F0280 Dementia in other diseases classified elsewhere without behavioral disturbance: Secondary | ICD-10-CM | POA: Diagnosis not present

## 2022-09-23 ENCOUNTER — Telehealth: Payer: Self-pay

## 2022-09-23 DIAGNOSIS — Z0289 Encounter for other administrative examinations: Secondary | ICD-10-CM

## 2022-09-23 NOTE — Telephone Encounter (Signed)
Patients' POA sent in a request for paperwork to be completed for an around the clock aid to be with patient. Are you agreeable to filling this out.  Thanks, [employer]

## 2022-09-25 NOTE — Telephone Encounter (Signed)
I have given the form to medical records to relay that Maralyn Sago thinks PCP should be one to complete paperwork

## 2022-09-26 DIAGNOSIS — H40013 Open angle with borderline findings, low risk, bilateral: Secondary | ICD-10-CM | POA: Diagnosis not present

## 2022-09-26 DIAGNOSIS — H02833 Dermatochalasis of right eye, unspecified eyelid: Secondary | ICD-10-CM | POA: Diagnosis not present

## 2022-09-26 DIAGNOSIS — H04123 Dry eye syndrome of bilateral lacrimal glands: Secondary | ICD-10-CM | POA: Diagnosis not present

## 2022-09-26 DIAGNOSIS — H43811 Vitreous degeneration, right eye: Secondary | ICD-10-CM | POA: Diagnosis not present

## 2022-09-26 DIAGNOSIS — H524 Presbyopia: Secondary | ICD-10-CM | POA: Diagnosis not present

## 2022-10-01 DIAGNOSIS — Z9181 History of falling: Secondary | ICD-10-CM | POA: Diagnosis not present

## 2022-10-01 DIAGNOSIS — G4700 Insomnia, unspecified: Secondary | ICD-10-CM | POA: Diagnosis not present

## 2022-10-01 DIAGNOSIS — F0283 Dementia in other diseases classified elsewhere, unspecified severity, with mood disturbance: Secondary | ICD-10-CM | POA: Diagnosis not present

## 2022-10-01 DIAGNOSIS — N400 Enlarged prostate without lower urinary tract symptoms: Secondary | ICD-10-CM | POA: Diagnosis not present

## 2022-10-01 DIAGNOSIS — E785 Hyperlipidemia, unspecified: Secondary | ICD-10-CM | POA: Diagnosis not present

## 2022-10-01 DIAGNOSIS — F0284 Dementia in other diseases classified elsewhere, unspecified severity, with anxiety: Secondary | ICD-10-CM | POA: Diagnosis not present

## 2022-10-01 DIAGNOSIS — G20A1 Parkinson's disease without dyskinesia, without mention of fluctuations: Secondary | ICD-10-CM | POA: Diagnosis not present

## 2022-10-01 DIAGNOSIS — D649 Anemia, unspecified: Secondary | ICD-10-CM | POA: Diagnosis not present

## 2022-10-01 DIAGNOSIS — S2241XD Multiple fractures of ribs, right side, subsequent encounter for fracture with routine healing: Secondary | ICD-10-CM | POA: Diagnosis not present

## 2022-10-01 DIAGNOSIS — F32A Depression, unspecified: Secondary | ICD-10-CM | POA: Diagnosis not present

## 2022-10-02 DIAGNOSIS — E785 Hyperlipidemia, unspecified: Secondary | ICD-10-CM | POA: Diagnosis not present

## 2022-10-02 DIAGNOSIS — E041 Nontoxic single thyroid nodule: Secondary | ICD-10-CM | POA: Diagnosis not present

## 2022-10-02 DIAGNOSIS — Z125 Encounter for screening for malignant neoplasm of prostate: Secondary | ICD-10-CM | POA: Diagnosis not present

## 2022-10-02 DIAGNOSIS — Z79899 Other long term (current) drug therapy: Secondary | ICD-10-CM | POA: Diagnosis not present

## 2022-10-03 DIAGNOSIS — D649 Anemia, unspecified: Secondary | ICD-10-CM | POA: Diagnosis not present

## 2022-10-06 ENCOUNTER — Other Ambulatory Visit: Payer: Self-pay

## 2022-10-06 ENCOUNTER — Emergency Department (HOSPITAL_COMMUNITY): Payer: PPO

## 2022-10-06 ENCOUNTER — Emergency Department (HOSPITAL_COMMUNITY)
Admission: EM | Admit: 2022-10-06 | Discharge: 2022-10-07 | Disposition: A | Discharge status: Home / Self Care | Payer: PPO | Attending: Emergency Medicine | Admitting: Emergency Medicine

## 2022-10-06 DIAGNOSIS — R258 Other abnormal involuntary movements: Secondary | ICD-10-CM | POA: Diagnosis not present

## 2022-10-06 DIAGNOSIS — R462 Strange and inexplicable behavior: Secondary | ICD-10-CM | POA: Diagnosis not present

## 2022-10-06 DIAGNOSIS — R464 Slowness and poor responsiveness: Secondary | ICD-10-CM | POA: Diagnosis not present

## 2022-10-06 DIAGNOSIS — G20A1 Parkinson's disease without dyskinesia, without mention of fluctuations: Secondary | ICD-10-CM | POA: Insufficient documentation

## 2022-10-06 DIAGNOSIS — R55 Syncope and collapse: Secondary | ICD-10-CM | POA: Diagnosis not present

## 2022-10-06 DIAGNOSIS — R259 Unspecified abnormal involuntary movements: Secondary | ICD-10-CM

## 2022-10-06 DIAGNOSIS — R4182 Altered mental status, unspecified: Secondary | ICD-10-CM | POA: Diagnosis not present

## 2022-10-06 LAB — URINALYSIS, ROUTINE W REFLEX MICROSCOPIC
Bilirubin Urine: NEGATIVE
Glucose, UA: NEGATIVE mg/dL
Hgb urine dipstick: NEGATIVE
Ketones, ur: 5 mg/dL — AB
Nitrite: NEGATIVE
Protein, ur: NEGATIVE mg/dL
Specific Gravity, Urine: 1.023 (ref 1.005–1.030)
pH: 5 (ref 5.0–8.0)

## 2022-10-06 LAB — CBC WITH DIFFERENTIAL/PLATELET
Abs Immature Granulocytes: 0.02 10*3/uL (ref 0.00–0.07)
Basophils Absolute: 0 10*3/uL (ref 0.0–0.1)
Basophils Relative: 1 %
Eosinophils Absolute: 0.2 10*3/uL (ref 0.0–0.5)
Eosinophils Relative: 3 %
HCT: 30.2 % — ABNORMAL LOW (ref 39.0–52.0)
Hemoglobin: 9.6 g/dL — ABNORMAL LOW (ref 13.0–17.0)
Immature Granulocytes: 0 %
Lymphocytes Relative: 26 %
Lymphs Abs: 1.7 10*3/uL (ref 0.7–4.0)
MCH: 20.8 pg — ABNORMAL LOW (ref 26.0–34.0)
MCHC: 31.8 g/dL (ref 30.0–36.0)
MCV: 65.4 fL — ABNORMAL LOW (ref 80.0–100.0)
Monocytes Absolute: 0.6 10*3/uL (ref 0.1–1.0)
Monocytes Relative: 9 %
Neutro Abs: 4 10*3/uL (ref 1.7–7.7)
Neutrophils Relative %: 61 %
Platelets: 179 10*3/uL (ref 150–400)
RBC: 4.62 MIL/uL (ref 4.22–5.81)
RDW: 15.4 % (ref 11.5–15.5)
WBC: 6.5 10*3/uL (ref 4.0–10.5)
nRBC: 0 % (ref 0.0–0.2)

## 2022-10-06 LAB — COMPREHENSIVE METABOLIC PANEL
ALT: 7 U/L (ref 0–44)
AST: 14 U/L — ABNORMAL LOW (ref 15–41)
Albumin: 3.3 g/dL — ABNORMAL LOW (ref 3.5–5.0)
Alkaline Phosphatase: 92 U/L (ref 38–126)
Anion gap: 12 (ref 5–15)
BUN: 15 mg/dL (ref 8–23)
CO2: 24 mmol/L (ref 22–32)
Calcium: 9.1 mg/dL (ref 8.9–10.3)
Chloride: 102 mmol/L (ref 98–111)
Creatinine, Ser: 0.87 mg/dL (ref 0.61–1.24)
GFR, Estimated: >60 mL/min (ref >60)
Glucose, Bld: 110 mg/dL — ABNORMAL HIGH (ref 70–99)
Potassium: 3.9 mmol/L (ref 3.5–5.1)
Sodium: 138 mmol/L (ref 135–145)
Total Bilirubin: 0.3 mg/dL (ref 0.3–1.2)
Total Protein: 6.7 g/dL (ref 6.5–8.1)

## 2022-10-06 LAB — CBG MONITORING, ED: Glucose-Capillary: 125 mg/dL — ABNORMAL HIGH (ref 70–99)

## 2022-10-06 NOTE — ED Provider Notes (Addendum)
[place] EMERGENCY DEPARTMENT AT [facility] Provider Note  MDM   HPI/ROS:  Tony [name] is a 77 y.o. male with history of Parkinson's disease presenting from facility out of chief from concern for altered mental status and potential syncopal episode.  At around 530 this afternoon, patient was eating dinner and slumped over forwards and to the left.  Facility staff became concerned especially after patient's mental status did not quickly returned to baseline.  Family is at bedside and feels that he has been more disoriented as of recently and is concerned for UTI.  Of note, patient has a history of PD and has left-sided weakness at baseline.  He was recently discharged after experiencing a fall with 3 rib fractures.  That this time, he was discharged to SNF.  Physical exam is notable for: - Overall well-appearing, no acute distress - GCS 15, ANO x 4 - Neurologic exam overall unremarkable.  Some left-sided weakness and hand tremors, reported baseline per family.  Tremors and left upper extremity - Cardiopulmonary exam unremarkable - No abdominal tenderness to palpation - No lower extremity edema  On my initial evaluation, patient is:  -Vital signs stable. Patient afebrile, hemodynamically stable, and non-toxic appearing. -Additional history obtained from daughter and son-in-law at bedside  Given the patient's history and physical exam, differential diagnosis includes but is not limited to cardiogenic syncope, cardiac arrhythmia, vasovagal syncope, orthostatic syncope, infection, metabolic disturbance, etc.  Interpretations, interventions, and the patient's course of care are documented below.    Clinical Course as of 10/06/22 2321  Tony [name] Oct 06, 2022  2106 Stable 41 YOM with chief complaint of near syncope event. Back to baseline.  [CC]    Clinical Course User Index [CC] Glyn Ade, MD    Infectious metabolic workup initiated.  EKG overall unremarkable with no ST  changes, interval disturbances, conduction blocks.  Overall reassuring.  Labs returned with no acute abnormalities.  No leukocytosis on CBC, hemoglobin of 9.6, baseline per chart review.  CMP with normal electrolytes and renal function.  Urinalysis resulted without signs of infection.  Chest x-ray clear.  Upon reassessment, patient remains at mental baseline per family.  Remains ANO x 4, GCS 15, answering questions and following commands appropriately.  Remains conversationally competent.  Vital signs remained within normal limits, afebrile, normotensive, not tachycardic.  Given that patient has remained vitally stable, workup is unremarkable, patient is at his mental baseline, and presentation was benign, deemed safe and stable for discharge.  Strict return precautions voiced, instructed to follow-up with primary care provider.  Patient's daughter was called for transfer back to facility.  Disposition:  I discussed the plan for discharge with the patient and/or their surrogate at bedside prior to discharge and they were in agreement with the plan and verbalized understanding of the return precautions provided. All questions answered to the best of my ability. Ultimately, the patient was discharged in stable condition with stable vital signs. I am reassured that they are capable of close follow up and good social support at home.   Clinical Impression:  1. Abnormal movement     Rx / DC Orders ED Discharge Orders     None       The plan for this patient was discussed with Dr. Doran Durand, who voiced agreement and who oversaw evaluation and treatment of this patient.   Clinical Complexity A medically appropriate history, review of systems, and physical exam was performed.  My independent interpretations of EKG, labs, and radiology are  documented in the ED course above.   Click here for ABCD2, HEART and other calculatorsREFRESH Note before signing   Patient's presentation is most  consistent with acute complicated illness / injury requiring diagnostic workup.  Medical Decision Making Amount and/or Complexity of Data Reviewed Labs: ordered. Radiology: ordered.    HPI/ROS      See MDM section for pertinent HPI and ROS. A complete ROS was performed with pertinent positives/negatives noted above.   Past Medical History:  Diagnosis Date   Anxiety    Boil of buttock    High cholesterol    Imbalance    Low back pain    Parkinsons     Past Surgical History:  Procedure Laterality Date   CYST REMOVAL LEG  2014      Physical Exam   Vitals:   10/06/22 2100 10/06/22 2115 10/06/22 2200 10/06/22 2230  BP: 114/80 117/72 121/66 104/67  Pulse: 66 61 71 64  Resp: 16 (!) 25 16 14   Temp:      TempSrc:      SpO2: 100% 100% 99% 100%  Weight:      Height:        Physical Exam Vitals and nursing note reviewed.  Constitutional:      General: He is not in acute distress.    Appearance: He is well-developed.  HENT:     Head: Normocephalic and atraumatic.  Eyes:     Conjunctiva/sclera: Conjunctivae normal.  Cardiovascular:     Rate and Rhythm: Normal rate and regular rhythm.     Heart sounds: No murmur heard. Pulmonary:     Effort: Pulmonary effort is normal. No respiratory distress.     Breath sounds: Normal breath sounds.  Abdominal:     Palpations: Abdomen is soft.     Tenderness: There is no abdominal tenderness.  Musculoskeletal:        General: No swelling.     Cervical back: Neck supple.     Right lower leg: No edema.     Left lower leg: No edema.  Skin:    General: Skin is warm and dry.     Capillary Refill: Capillary refill takes less than 2 seconds.  Neurological:     General: No focal deficit present.     Mental Status: He is alert and oriented to person, place, and time.  Psychiatric:        Mood and Affect: Mood normal.     Starleen Arms, MD Department of Emergency Medicine   Please note that this documentation was produced with  the assistance of voice-to-text technology and may contain errors.    Dyanne Iha, MD 10/06/22 1610    Dyanne Iha, MD 10/06/22 9604    Glyn Ade, MD 10/11/22 1454

## 2022-10-06 NOTE — Discharge Instructions (Signed)
You were seen today for abnormal movement. While you were here we monitored your vitals, preformed a physical exam, and checked an EKG, blood work, urine, and imaging. These were all reassuring and there is no indication for any further testing or intervention in the emergency department at this time.   Things to do:  - Follow up with your primary care provider within the next 1-2 weeks  Return to the emergency department if you have any new or worsening symptoms including altered mental status, chest pain, shortness of breath, pain with urination, numbness/weakness/vision changes, or if you have any other concerns.

## 2022-10-06 NOTE — ED Triage Notes (Signed)
Pt bib PTAR from [facility]. Per report pt ambulated to dinner around 1745. At 1830 pt suddenly slumped over to the left and was unable to sit back up straight. Pt denies LOC and did not fall out of the chair. Per family pt "seems disoriented". Upon arrival to ED pt is a/ox4 and recalls the entire event.   Pt has hx of parkinson's and left side weakness.

## 2022-10-07 NOTE — ED Notes (Signed)
Pt daughter, Herbert Seta, came to pick up pt to transport back. Pt able to stand and get in wheelchair with minimal assistance.

## 2022-10-07 NOTE — ED Notes (Signed)
Daughters were called x2 with the numbers listed in patient chart referred to as "contacts", no answer x2 to question if they are able to take pt back to facility. RN notified.

## 2022-10-07 NOTE — ED Notes (Signed)
Ptar called, no eta 

## 2022-10-09 DIAGNOSIS — G20B2 Parkinson's disease with dyskinesia, with fluctuations: Secondary | ICD-10-CM | POA: Diagnosis not present

## 2022-10-09 DIAGNOSIS — E785 Hyperlipidemia, unspecified: Secondary | ICD-10-CM | POA: Diagnosis not present

## 2022-10-09 DIAGNOSIS — R296 Repeated falls: Secondary | ICD-10-CM | POA: Diagnosis not present

## 2022-10-09 DIAGNOSIS — L989 Disorder of the skin and subcutaneous tissue, unspecified: Secondary | ICD-10-CM | POA: Diagnosis not present

## 2022-10-09 DIAGNOSIS — G4700 Insomnia, unspecified: Secondary | ICD-10-CM | POA: Diagnosis not present

## 2022-10-09 DIAGNOSIS — M48061 Spinal stenosis, lumbar region without neurogenic claudication: Secondary | ICD-10-CM | POA: Diagnosis not present

## 2022-10-09 DIAGNOSIS — R82998 Other abnormal findings in urine: Secondary | ICD-10-CM | POA: Diagnosis not present

## 2022-10-09 DIAGNOSIS — Z0000 Encounter for general adult medical examination without abnormal findings: Secondary | ICD-10-CM | POA: Diagnosis not present

## 2022-10-09 DIAGNOSIS — J309 Allergic rhinitis, unspecified: Secondary | ICD-10-CM | POA: Diagnosis not present

## 2022-10-09 DIAGNOSIS — E041 Nontoxic single thyroid nodule: Secondary | ICD-10-CM | POA: Diagnosis not present

## 2022-10-09 DIAGNOSIS — Z1339 Encounter for screening examination for other mental health and behavioral disorders: Secondary | ICD-10-CM | POA: Diagnosis not present

## 2022-10-09 DIAGNOSIS — G629 Polyneuropathy, unspecified: Secondary | ICD-10-CM | POA: Diagnosis not present

## 2022-10-09 DIAGNOSIS — D568 Other thalassemias: Secondary | ICD-10-CM | POA: Diagnosis not present

## 2022-10-09 DIAGNOSIS — Z1331 Encounter for screening for depression: Secondary | ICD-10-CM | POA: Diagnosis not present

## 2022-10-09 DIAGNOSIS — K5730 Diverticulosis of large intestine without perforation or abscess without bleeding: Secondary | ICD-10-CM | POA: Diagnosis not present

## 2022-10-17 ENCOUNTER — Ambulatory Visit: Payer: PPO | Admitting: Neurology

## 2022-10-17 ENCOUNTER — Encounter: Payer: Self-pay | Admitting: Neurology

## 2022-10-17 VITALS — BP 114/72 | HR 74 | Resp 15 | Ht 72.0 in | Wt 194.0 lb

## 2022-10-17 DIAGNOSIS — R269 Unspecified abnormalities of gait and mobility: Secondary | ICD-10-CM

## 2022-10-17 DIAGNOSIS — G20C Parkinsonism, unspecified: Secondary | ICD-10-CM | POA: Diagnosis not present

## 2022-10-17 DIAGNOSIS — R4189 Other symptoms and signs involving cognitive functions and awareness: Secondary | ICD-10-CM | POA: Diagnosis not present

## 2022-10-17 NOTE — Progress Notes (Signed)
ASSESSMENT AND PLAN 77 y.o. 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  Return To Clinic With NP In 6 Months  DIAGNOSTIC DATA (LABS, IMAGING, TESTING) - I reviewed patient records, labs, notes, testing and imaging myself where available.     DaTSCAN in July 2022, bilateral decreased radiotracer activity in the putamen and asymmetric decreased radiotracer activity in the head of right caudate nucleus, this pattern of loss dopamine transporter activity is suggestive of parkinsonian syndrome pathology,    HISTORY  Tony [name] is a 77 year old male, seen in request by his primary care physician Dr. Felipa Eth, Joylene Draft for evaluation of memory loss, unsteady gait, initial evaluation was on March 30, 2020 with his wife,   I reviewed and summarized the referring note.  Past medical history Hyperlipidemia   He has been very active all his life, played basketball until age 66, since he moved to a different house in 2020, he become less active, he doesn't have to do his yard work anymore, had a gradual onset anxiety, especially noticeable since October 2021, he has difficulty sleeping, taking melatonin, also noticed difficulty driving, could not park properly, he was treated with Lexapro for a long time, dosage was increased to 5 to 10 mg, he complains of  intolerable dizziness, has to back down to 5 mg again    He is still walking his neighborhood regularly, 1 to 2 miles each day, but he noticed mild difficulty walking, tripped easily, lost dexterity, was taken to emergency room October 19, he was doing his routine walk, without any clear triggers, he fell face down, he denied loss of consciousness, he does complain dizziness with sudden positional change, such as getting up from prolonged sitting position   Laboratory evaluation October 2021, hemoglobin of 11.9, normal BMP MRI of brain without contrast in Feb 2022 mild generalized atrophy, ventriculomegaly seems to be out of proportion to the generalized atrophy, CT of cervical spine, degenerative changes, no acute fracture   MoCA examination is only 20 out of 30, he has apparent visuospatial difficulties   With his memory loss, parkinsonian features, he was started on Sinemet 25/100 mg since initial visit in February 2022, reported moderate improvement,    Laboratory evaluation February 2022, normal B12, RPR, BMP, CBC showed mildly decreased hemoglobin 11.3, is at his baseline, 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    UPDATE December 25, 2020: He is accompanied by his wife at today's visit,    DaTSCAN in July 2022, bilateral decreased radiotracer activity in the putamen and asymmetric decreased radiotracer activity in the head of right caudate nucleus, this pattern of loss dopamine transporter activity is suggestive of parkinsonian syndrome pathology,   His walking has improved taking Sinemet 25/100 mg 3 times a day, he walks 1/2 mile each day, sometimes uses walking stick, sleeps well most of the time, wake up few times in the middle of the night using bathroom, moves a lot, talking  during his dream,  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, 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[employer], was noted to have mild memory loss, tends to [employer], drinks a lot of water, driving  without difficulty, seems to be more depressed anxious as well  UPDATE Sept 14 2023: He is accompanied by his wife and daughter at today's clinical visit, reported decline, slower motion, increased difficulty walking, difficulty buttoning his shirt  MoCA examination 26/30, mostly visual-spatial disorientation, slow processing time,   He is taking Sinemet 25/100 mg 30 minutes after meal, prior he was taking empty stomach, complains of dizziness, nausea is better, today's examination is performed 90 minutes after last dose of Sinemet, significant parkinsonian features, left worse than right  Physical therapy was helpful, is motivated to do cycling regularly every week  He complains of excessive drowsiness, sleepiness, taking frequent naps, loud snoring, narrow oropharyngeal space, at risk for obstructive sleep apnea  UPDATE October 17 2022: He is at assistant living now, accompanied by his wife and daughter at clinical visit, continue to progress rapidly, worsening confusion, could not complete MOCA examination, significant executive visual spatial disorientation, Mini-Mental Status Examination 21/30  He can carry on normal conversation, but word finding difficulty, slow thinking, this has caused social embarrassment, he is not as active as he is family think he should be, spent most of the time watching TV, occasionally visual hallucinations  Previous Lexapro 20 mg daily has helped him some, also take trazodone 50 mg for sleep, has vivid nightmares, is taking Aricept 10 mg every morning,  REVIEW OF SYSTEMS: Out of a complete 14 system review of symptoms, the patient complains only of the following symptoms, and all other reviewed systems are negative.  See HPI  PHYSICAL EXAM  Vitals:   11/08/21 0932  BP: 120/75  Pulse: 71  Weight: 202 lb (91.6 kg)  Height: 5\' 11"  (1.803 m)   Body mass index is 28.17 kg/m.     05/16/2022    9:39 AM 11/08/2021    9:00 AM 07/04/2021   10:35 AM  12/25/2020   11:00 AM 03/30/2020    8:00 AM  Montreal Cognitive Assessment   Visuospatial/ Executive (0/5) 1 1 0 3 0  Naming (0/3) 3 3 2 3 3   Attention: Read list of digits (0/2) 2 2 2 2 2   Attention: Read list of letters (0/1) 1 1 0 0 1  Attention: Serial 7 subtraction starting at 100 (0/3) 3 3 1 3 2   Language: Repeat phrase (0/2) 1 2 2 2 1   Language : Fluency (0/1) 1 1 1 1 1   Abstraction (0/2) 2 2 2 2 2   Delayed Recall (0/5) 3 2 0 1 2  Orientation (0/6) 6 6 6 6 6   Total 23 23 16 23 20     Generalized:  decreased facial expression, voice is soft, word finding difficulties, Neurological examination  Cranial nerve II-XII: Pupils were small equal round reactive to light. Extraocular movements were full, .  Left visual field deficit, facial symmetric, facial sensation and strength were normal.  Head turning and shoulder shrug  were normal and symmetric.  Motor: Pronation drift of left upper extremity, fixation of left upper extremity on rapid rotating movement left more than right moderate rigidity, bradykinesia, Sensory: Left hemineglect Coordination: No truncal ataxia or limb dysmetria, Gait and station: Need push-up to get up from seated position, absent left side swing, en bloc turning, wide-based Reflexes: Deep tendon reflexes are hypo active and symmetric  bilaterally  ALLERGIES: Allergies  Allergen Reactions   Shellfish Allergy Hives and Swelling    Other Reaction(s): Unknown   Shellfish-Derived Products Hives and Swelling    HOME MEDICATIONS: Outpatient Medications Prior to Visit  Medication Sig Dispense Refill   acetaminophen (TYLENOL) 325 MG tablet Take 2 tablets (650 mg total) by mouth every 6 (six) hours as needed.     atorvastatin (LIPITOR) 20 MG tablet Take 20 mg by mouth every evening.     carbidopa-levodopa (SINEMET IR) 25-100 MG tablet Take 2 tablets by mouth 3 (three) times daily. (Patient taking differently: Take 2 tablets by mouth See admin instructions. Take 2  tablets by mouth at 8 AM, 2 PM, and 8 PM) 180 tablet 11   cetirizine (ZYRTEC) 10 MG tablet Take 10 mg by mouth daily.     diclofenac Sodium (VOLTAREN ARTHRITIS PAIN) 1 % GEL Apply 2 g topically 4 (four) times daily as needed (rib pain).     donepezil (ARICEPT) 10 MG tablet Take 1 tablet (10 mg total) by mouth in the morning. 90 tablet 3   escitalopram (LEXAPRO) 20 MG tablet Take 20 mg by mouth daily.     lidocaine (LIDODERM) 5 % Place 3 patches onto the skin daily. Apply to rib pain.  Remove & Discard patch within 12 hours or as directed by MD     Multiple Vitamins-Minerals (CERTAVITE SENIOR/ANTIOXIDANT) TABS Take 1 tablet by mouth daily with breakfast.     polyethylene glycol (MIRALAX / GLYCOLAX) 17 g packet Take 17 g by mouth daily. Titrate to 1 soft bowel movement daily 14 each 0   traZODone (DESYREL) 50 MG tablet Take 50 mg by mouth at bedtime.     No facility-administered medications prior to visit.    PAST MEDICAL HISTORY: Past Medical History:  Diagnosis Date   Anxiety    Boil of buttock    High cholesterol    Imbalance    Low back pain    Parkinsons     PAST SURGICAL HISTORY: Past Surgical History:  Procedure Laterality Date   CYST REMOVAL LEG  2014    FAMILY HISTORY: Family History  Problem Relation Age of Onset   Stroke Mother    Heart disease Father    Heart attack Paternal Grandfather    Sleep apnea Neg Hx     SOCIAL HISTORY: Social History   Socioeconomic History   Marital status: Married    Spouse name: Not on file   Number of children: 5   Years of education: college   Highest education level: Bachelor's degree (e.g., BA, AB, BS)  Occupational History   Occupation: Retired  Tobacco Use   Smoking status: Never   Smokeless tobacco: Current  Substance and Sexual Activity   Alcohol use: Yes    Comment: one beer weekly   Drug use: Never   Sexual activity: Not on file  Other Topics Concern   Not on file  Social History Narrative   Lives at home  with his wife.   Right-handed.   Caffeine use: recently discontinued daily use of [facility].   Social Determinants of Health   Financial Resource Strain: Not on file  Food Insecurity: No Food Insecurity (09/04/2022)   Hunger Vital Sign    Worried About Running Out of Food in the Last Year: Never true    Ran Out of Food in the Last Year: Never true  Transportation Needs: No Transportation Needs (09/04/2022)   PRAPARE - Transportation  Lack of Transportation (Medical): No    Lack of Transportation (Non-Medical): No  Physical Activity: Not on file  Stress: Not on file  Social Connections: Not on file  Intimate Partner Violence: Not At Risk (09/04/2022)   Humiliation, Afraid, Rape, and Kick questionnaire    Fear of Current or Ex-Partner: No    Emotionally Abused: No    Physically Abused: No    Sexually Abused: No    Levert Feinstein, M.D. Ph.D.  [facility] [address] [place], [place] [address] Phone: [phone] Fax:      [phone]   Total time spent reviewing the chart, obtaining history, examined patient, ordering tests, documentation, consultations and family, care coordination was 42 mintues

## 2022-10-17 NOTE — Patient Instructions (Signed)
Keep current dose of Sinemet 25/100 2 tabs tid Stop Aricept 10mg  daily, call office if noticed worsening symptoms Continue Physical therapy.

## 2022-10-21 ENCOUNTER — Emergency Department (HOSPITAL_COMMUNITY): Payer: No Typology Code available for payment source

## 2022-10-21 ENCOUNTER — Other Ambulatory Visit: Payer: Self-pay

## 2022-10-21 ENCOUNTER — Encounter (HOSPITAL_COMMUNITY): Payer: Self-pay

## 2022-10-21 ENCOUNTER — Inpatient Hospital Stay (HOSPITAL_COMMUNITY)
Admission: EM | Admit: 2022-10-21 | Discharge: 2022-11-04 | DRG: 872 | Disposition: A | Discharge status: Skilled Nursing Facility | Payer: No Typology Code available for payment source | Attending: Family Medicine | Admitting: Family Medicine

## 2022-10-21 DIAGNOSIS — Z7409 Other reduced mobility: Secondary | ICD-10-CM | POA: Diagnosis present

## 2022-10-21 DIAGNOSIS — R627 Adult failure to thrive: Secondary | ICD-10-CM | POA: Diagnosis present

## 2022-10-21 DIAGNOSIS — F0280 Dementia in other diseases classified elsewhere without behavioral disturbance: Secondary | ICD-10-CM | POA: Diagnosis present

## 2022-10-21 DIAGNOSIS — A4151 Sepsis due to Escherichia coli [E. coli]: Principal | ICD-10-CM | POA: Diagnosis present

## 2022-10-21 DIAGNOSIS — E785 Hyperlipidemia, unspecified: Secondary | ICD-10-CM | POA: Insufficient documentation

## 2022-10-21 DIAGNOSIS — Z1611 Resistance to penicillins: Secondary | ICD-10-CM | POA: Diagnosis present

## 2022-10-21 DIAGNOSIS — Z1152 Encounter for screening for COVID-19: Secondary | ICD-10-CM

## 2022-10-21 DIAGNOSIS — R509 Fever, unspecified: Secondary | ICD-10-CM | POA: Diagnosis not present

## 2022-10-21 DIAGNOSIS — R414 Neurologic neglect syndrome: Secondary | ICD-10-CM | POA: Diagnosis present

## 2022-10-21 DIAGNOSIS — Z8249 Family history of ischemic heart disease and other diseases of the circulatory system: Secondary | ICD-10-CM

## 2022-10-21 DIAGNOSIS — Z91013 Allergy to seafood: Secondary | ICD-10-CM

## 2022-10-21 DIAGNOSIS — G4700 Insomnia, unspecified: Secondary | ICD-10-CM | POA: Diagnosis present

## 2022-10-21 DIAGNOSIS — R410 Disorientation, unspecified: Secondary | ICD-10-CM

## 2022-10-21 DIAGNOSIS — I10 Essential (primary) hypertension: Secondary | ICD-10-CM | POA: Diagnosis not present

## 2022-10-21 DIAGNOSIS — R531 Weakness: Secondary | ICD-10-CM | POA: Diagnosis not present

## 2022-10-21 DIAGNOSIS — G20A1 Parkinson's disease without dyskinesia, without mention of fluctuations: Secondary | ICD-10-CM | POA: Diagnosis present

## 2022-10-21 DIAGNOSIS — N390 Urinary tract infection, site not specified: Secondary | ICD-10-CM | POA: Diagnosis present

## 2022-10-21 DIAGNOSIS — Z66 Do not resuscitate: Secondary | ICD-10-CM | POA: Diagnosis not present

## 2022-10-21 DIAGNOSIS — F419 Anxiety disorder, unspecified: Secondary | ICD-10-CM | POA: Diagnosis present

## 2022-10-21 DIAGNOSIS — M545 Low back pain, unspecified: Secondary | ICD-10-CM | POA: Diagnosis present

## 2022-10-21 DIAGNOSIS — A419 Sepsis, unspecified organism: Secondary | ICD-10-CM | POA: Diagnosis present

## 2022-10-21 DIAGNOSIS — N400 Enlarged prostate without lower urinary tract symptoms: Secondary | ICD-10-CM | POA: Diagnosis present

## 2022-10-21 DIAGNOSIS — Z720 Tobacco use: Secondary | ICD-10-CM

## 2022-10-21 DIAGNOSIS — Z823 Family history of stroke: Secondary | ICD-10-CM

## 2022-10-21 DIAGNOSIS — R001 Bradycardia, unspecified: Secondary | ICD-10-CM | POA: Diagnosis not present

## 2022-10-21 DIAGNOSIS — R4189 Other symptoms and signs involving cognitive functions and awareness: Secondary | ICD-10-CM | POA: Diagnosis present

## 2022-10-21 DIAGNOSIS — R441 Visual hallucinations: Secondary | ICD-10-CM | POA: Diagnosis not present

## 2022-10-21 DIAGNOSIS — R4182 Altered mental status, unspecified: Secondary | ICD-10-CM | POA: Diagnosis not present

## 2022-10-21 DIAGNOSIS — E871 Hypo-osmolality and hyponatremia: Secondary | ICD-10-CM

## 2022-10-21 DIAGNOSIS — Z79899 Other long term (current) drug therapy: Secondary | ICD-10-CM

## 2022-10-21 DIAGNOSIS — E7800 Pure hypercholesterolemia, unspecified: Secondary | ICD-10-CM | POA: Diagnosis present

## 2022-10-21 DIAGNOSIS — I951 Orthostatic hypotension: Secondary | ICD-10-CM | POA: Diagnosis not present

## 2022-10-21 DIAGNOSIS — I700 Atherosclerosis of aorta: Secondary | ICD-10-CM | POA: Diagnosis not present

## 2022-10-21 DIAGNOSIS — D649 Anemia, unspecified: Secondary | ICD-10-CM | POA: Insufficient documentation

## 2022-10-21 DIAGNOSIS — R54 Age-related physical debility: Secondary | ICD-10-CM | POA: Diagnosis present

## 2022-10-21 LAB — I-STAT CHEM 8, ED
BUN: 19 mg/dL (ref 8–23)
Calcium, Ion: 1.13 mmol/L — ABNORMAL LOW (ref 1.15–1.40)
Chloride: 101 mmol/L (ref 98–111)
Creatinine, Ser: 0.9 mg/dL (ref 0.61–1.24)
Glucose, Bld: 123 mg/dL — ABNORMAL HIGH (ref 70–99)
HCT: 32 % — ABNORMAL LOW (ref 39.0–52.0)
Hemoglobin: 10.9 g/dL — ABNORMAL LOW (ref 13.0–17.0)
Potassium: 4.3 mmol/L (ref 3.5–5.1)
Sodium: 135 mmol/L (ref 135–145)
TCO2: 25 mmol/L (ref 22–32)

## 2022-10-21 LAB — PROTIME-INR
INR: 1.1 (ref 0.8–1.2)
Prothrombin Time: 14.4 seconds (ref 11.4–15.2)

## 2022-10-21 LAB — COMPREHENSIVE METABOLIC PANEL
ALT: 8 U/L (ref 0–44)
AST: 11 U/L — ABNORMAL LOW (ref 15–41)
Albumin: 3.5 g/dL (ref 3.5–5.0)
Alkaline Phosphatase: 74 U/L (ref 38–126)
Anion gap: 9 (ref 5–15)
BUN: 15 mg/dL (ref 8–23)
CO2: 23 mmol/L (ref 22–32)
Calcium: 8.9 mg/dL (ref 8.9–10.3)
Chloride: 101 mmol/L (ref 98–111)
Creatinine, Ser: 0.96 mg/dL (ref 0.61–1.24)
GFR, Estimated: >60 mL/min (ref >60)
Glucose, Bld: 131 mg/dL — ABNORMAL HIGH (ref 70–99)
Potassium: 3.6 mmol/L (ref 3.5–5.1)
Sodium: 133 mmol/L — ABNORMAL LOW (ref 135–145)
Total Bilirubin: 0.6 mg/dL (ref 0.3–1.2)
Total Protein: 7.2 g/dL (ref 6.5–8.1)

## 2022-10-21 LAB — CBC
HCT: 32 % — ABNORMAL LOW (ref 39.0–52.0)
Hemoglobin: 10.1 g/dL — ABNORMAL LOW (ref 13.0–17.0)
MCH: 21 pg — ABNORMAL LOW (ref 26.0–34.0)
MCHC: 31.6 g/dL (ref 30.0–36.0)
MCV: 66.5 fL — ABNORMAL LOW (ref 80.0–100.0)
Platelets: 191 10*3/uL (ref 150–400)
RBC: 4.81 MIL/uL (ref 4.22–5.81)
RDW: 15.5 % (ref 11.5–15.5)
WBC: 18.3 10*3/uL — ABNORMAL HIGH (ref 4.0–10.5)
nRBC: 0 % (ref 0.0–0.2)

## 2022-10-21 LAB — URINALYSIS, ROUTINE W REFLEX MICROSCOPIC
Bilirubin Urine: NEGATIVE
Glucose, UA: NEGATIVE mg/dL
Ketones, ur: 5 mg/dL — AB
Nitrite: POSITIVE — AB
Protein, ur: 30 mg/dL — AB
RBC / HPF: >50 RBC/hpf (ref 0–5)
Specific Gravity, Urine: 1.023 (ref 1.005–1.030)
WBC, UA: >50 WBC/hpf (ref 0–5)
pH: 5 (ref 5.0–8.0)

## 2022-10-21 LAB — APTT: aPTT: 34 seconds (ref 24–36)

## 2022-10-21 LAB — RESP PANEL BY RT-PCR (RSV, FLU A&B, COVID)  RVPGX2
Influenza A by PCR: NEGATIVE
Influenza B by PCR: NEGATIVE
Resp Syncytial Virus by PCR: NEGATIVE
SARS Coronavirus 2 by RT PCR: NEGATIVE

## 2022-10-21 LAB — I-STAT CG4 LACTIC ACID, ED: Lactic Acid, Venous: 1.8 mmol/L (ref 0.5–1.9)

## 2022-10-21 LAB — CBG MONITORING, ED: Glucose-Capillary: 132 mg/dL — ABNORMAL HIGH (ref 70–99)

## 2022-10-21 MED ORDER — LACTATED RINGERS IV SOLN: INTRAVENOUS | Status: AC

## 2022-10-21 MED ORDER — ACETAMINOPHEN 500 MG PO TABS
1000.0000 mg | ORAL_TABLET | Freq: Four times a day (QID) | ORAL | Status: DC | PRN
  Administered 2022-10-21 – 2022-10-22 (×2): 1000 mg via ORAL
  Filled 2022-10-21 (×2): qty 2

## 2022-10-21 MED ORDER — SODIUM CHLORIDE 0.9 % IV SOLN
1.0000 g | Freq: Once | INTRAVENOUS | Status: AC
  Administered 2022-10-21: 1 g via INTRAVENOUS
  Filled 2022-10-21: qty 10

## 2022-10-21 MED ORDER — SODIUM CHLORIDE 0.9 % IV BOLUS
1000.0000 mL | Freq: Once | INTRAVENOUS | Status: AC
  Administered 2022-10-21: 1000 mL via INTRAVENOUS

## 2022-10-21 NOTE — Sepsis Progress Note (Signed)
Elink following for Sepsis Protocol 

## 2022-10-21 NOTE — ED Notes (Signed)
Wife Cari Voshell - [phone]  Daughter Clydie Braun - [phone]

## 2022-10-21 NOTE — ED Provider Notes (Signed)
[place] EMERGENCY DEPARTMENT AT [facility] Provider Note   CSN: [id number] Arrival date & time: 10/21/22  1835     History  Chief Complaint  Patient presents with   Altered Mental Status    Tony [name] is a 77 y.o. male w/ pmhx of thalassemia hyperlipidemia Parkinson's and left-sided weakness presented to the emergency room today with altered mental status and increased generalized weakness that started this morning.  Family member feels that patient is hallucinating and reaching out for things that are not there.  Not reporting any pain but he feels hot.  No episodes of diarrhea or vomiting that they know of. Recently hospitalized for rib fracture after fall 1 month ago.    Altered Mental Status      Home Medications Prior to Admission medications   Medication Sig Start Date End Date Taking? Authorizing Provider  acetaminophen (TYLENOL) 325 MG tablet Take 2 tablets (650 mg total) by mouth every 6 (six) hours as needed. 09/09/22 09/09/23  Zigmund Daniel., MD  atorvastatin (LIPITOR) 20 MG tablet Take 20 mg by mouth every evening.    [provider]  carbidopa-levodopa (SINEMET IR) 25-100 MG tablet Take 2 tablets by mouth 3 (three) times daily. Patient taking differently: Take 2 tablets by mouth See admin instructions. Take 2 tablets by mouth at 8 AM, 2 PM, and 8 PM 05/16/22   Glean Salvo, NP  cetirizine (ZYRTEC) 10 MG tablet Take 10 mg by mouth daily.    [provider]  diclofenac Sodium (VOLTAREN ARTHRITIS PAIN) 1 % GEL Apply 2 g topically 4 (four) times daily as needed (rib pain). 09/09/22   Zigmund Daniel., MD  donepezil (ARICEPT) 10 MG tablet Take 1 tablet (10 mg total) by mouth in the morning. 05/16/22   Glean Salvo, NP  escitalopram (LEXAPRO) 20 MG tablet Take 20 mg by mouth daily.    [provider]  lidocaine (LIDODERM) 5 % Place 3 patches onto the skin daily. Apply to rib pain.  Remove & Discard patch within 12 hours  or as directed by MD 09/09/22   Zigmund Daniel., MD  Multiple Vitamins-Minerals (CERTAVITE SENIOR/ANTIOXIDANT) TABS Take 1 tablet by mouth daily with breakfast.    [provider]  polyethylene glycol (MIRALAX / GLYCOLAX) 17 g packet Take 17 g by mouth daily. Titrate to 1 soft bowel movement daily 09/09/22   Zigmund Daniel., MD  traZODone (DESYREL) 50 MG tablet Take 50 mg by mouth at bedtime. 09/24/21   [provider]      Allergies    Shellfish allergy and Shellfish-derived products    Review of Systems   Review of Systems  Physical Exam Updated Vital Signs BP 107/86   Pulse (!) 102   Temp (!) 103.9 F (39.9 C) (Rectal)   Resp (!) 21   Ht 6' (1.829 m)   Wt 88 kg   SpO2 100%   BMI 26.31 kg/m  Physical Exam Vitals and nursing note reviewed.  Constitutional:      General: He is not in acute distress.    Appearance: He is not toxic-appearing.  HENT:     Head: Normocephalic and atraumatic.  Eyes:     General: No scleral icterus.    Conjunctiva/sclera: Conjunctivae normal.  Cardiovascular:     Rate and Rhythm: Normal rate and regular rhythm.     Pulses: Normal pulses.     Heart sounds: Normal heart sounds.  Pulmonary:  Effort: Pulmonary effort is normal. No respiratory distress.     Breath sounds: Normal breath sounds.  Abdominal:     General: Abdomen is flat. Bowel sounds are normal.     Palpations: Abdomen is soft.     Tenderness: There is no abdominal tenderness.  Skin:    General: Skin is warm and dry.     Findings: No lesion.  Neurological:     General: No focal deficit present.     Mental Status: He is alert and oriented to person, place, and time. Mental status is at baseline.     ED Results / Procedures / Treatments   Labs (all labs ordered are listed, but only abnormal results are displayed) Labs Reviewed  CBG MONITORING, ED - Abnormal; Notable for the following components:      Result Value   Glucose-Capillary 132 (*)     All other components within normal limits  I-STAT CHEM 8, ED - Abnormal; Notable for the following components:   Glucose, Bld 123 (*)    Calcium, Ion 1.13 (*)    Hemoglobin 10.9 (*)    HCT 32.0 (*)    All other components within normal limits  RESP PANEL BY RT-PCR (RSV, FLU A&B, COVID)  RVPGX2  CULTURE, BLOOD (ROUTINE X 2)  CULTURE, BLOOD (ROUTINE X 2)  COMPREHENSIVE METABOLIC PANEL  CBC  URINALYSIS, ROUTINE W REFLEX MICROSCOPIC  PROTIME-INR  APTT  I-STAT CG4 LACTIC ACID, ED    EKG None  Radiology No results found.  Procedures .Critical Care  Performed by: Smitty Knudsen, PA-C Authorized by: Smitty Knudsen, PA-C   Critical care provider statement:    Critical care time (minutes):  42   Critical care was necessary to treat or prevent imminent or life-threatening deterioration of the following conditions:  Sepsis   Critical care was time spent personally by me on the following activities:  Development of treatment plan with patient or surrogate, discussions with consultants, evaluation of patient's response to treatment, examination of patient, ordering and review of laboratory studies, ordering and review of radiographic studies, ordering and performing treatments and interventions, pulse oximetry, re-evaluation of patient's condition and review of old charts     Medications Ordered in ED Medications  acetaminophen (TYLENOL) tablet 1,000 mg (has no administration in time range)  cefTRIAXone (ROCEPHIN) 1 g in sodium chloride 0.9 % 100 mL IVPB (has no administration in time range)  sodium chloride 0.9 % bolus 1,000 mL (has no administration in time range)  lactated ringers infusion (has no administration in time range)    ED Course/ Medical Decision Making/ A&P                                 Medical Decision Making Amount and/or Complexity of Data Reviewed Labs: ordered. Radiology: ordered.  Risk OTC drugs. Prescription drug management. Decision regarding  hospitalization.   This patient presents to the ED for concern of AMS, this involves an extensive number of treatment options, and is a complaint that carries with it a high risk of complications and morbidity.  The differential diagnosis includes ammonia, electrolyte abnormality, dehydration, UTI, upper respiratory tract infection   Co morbidities that complicate the patient evaluation  Parkinsons   Additional history obtained:  Additional history obtained from wife and daughter at bedside   Lab Tests:  I Ordered, and personally interpreted labs.  The pertinent results include:   Poc bg Cbc  elevated wbc, hbg consistent with baseline Cmp unremarkable  Lactic wnl BC x2 PENDING  UA significant nitrites, leukocytes and bacteria  (-) COVID, Flu, RSV   Imaging Studies ordered:  I ordered imaging studies including cxray   I independently visualized and interpreted imaging which showed no acute findings  I agree with the radiologist interpretation   Cardiac Monitoring: / EKG:  The patient was maintained on a cardiac monitor.  I personally viewed and interpreted the cardiac monitored which showed an underlying rhythm of: sinus tachy   Consultations Obtained:  I requested consultation with the hospitalist,  and discussed lab and imaging findings as well as pertinent plan - they recommend: admission   Problem List / ED Course / Critical interventions / Medication management  Patient presenting to the emergency room with altered mental status that has been ongoing since this morning.  Wife is reporting possible hallucinations and increase generalized weakness. Uon arrival tachy, fever, and increase RR - started sepsis workup and gave 1 g rocephin and 1 g tylenol  Reevaluation of the patient after these medicines showed that the patient improved I have reviewed the patients home medicines and have made adjustments as needed   Plan Patient admitted to hospital for sepsis, UTI  and AMS        Final Clinical Impression(s) / ED Diagnoses Final diagnoses:  None    Rx / DC Orders ED Discharge Orders     None         Raford Pitcher Evalee Jefferson 10/21/22 2305    Eber Hong, MD 10/22/22 [phone]

## 2022-10-21 NOTE — ED Triage Notes (Signed)
Pt BIBGEMS from [facility] with c/o altered mental status. Per family patient seemed altered and weaker today. Pt appears to not be using left side as much. Larey Seat one month ago and got rib fractures, stroke screen negative. Patient leans to the left. Pt is hot to the touch. Alert and oriented x 4.   134/84 Hr 110 Rr 22 CBG 183  Hx Parkinsons and dementia

## 2022-10-22 DIAGNOSIS — N400 Enlarged prostate without lower urinary tract symptoms: Secondary | ICD-10-CM | POA: Diagnosis not present

## 2022-10-22 DIAGNOSIS — I951 Orthostatic hypotension: Secondary | ICD-10-CM | POA: Diagnosis not present

## 2022-10-22 DIAGNOSIS — A419 Sepsis, unspecified organism: Secondary | ICD-10-CM | POA: Diagnosis not present

## 2022-10-22 DIAGNOSIS — R531 Weakness: Secondary | ICD-10-CM | POA: Diagnosis not present

## 2022-10-22 DIAGNOSIS — Z7409 Other reduced mobility: Secondary | ICD-10-CM | POA: Diagnosis present

## 2022-10-22 DIAGNOSIS — F0390 Unspecified dementia without behavioral disturbance: Secondary | ICD-10-CM | POA: Diagnosis not present

## 2022-10-22 DIAGNOSIS — M6281 Muscle weakness (generalized): Secondary | ICD-10-CM | POA: Diagnosis not present

## 2022-10-22 DIAGNOSIS — K5730 Diverticulosis of large intestine without perforation or abscess without bleeding: Secondary | ICD-10-CM | POA: Diagnosis not present

## 2022-10-22 DIAGNOSIS — Z91013 Allergy to seafood: Secondary | ICD-10-CM | POA: Diagnosis not present

## 2022-10-22 DIAGNOSIS — Z66 Do not resuscitate: Secondary | ICD-10-CM | POA: Diagnosis not present

## 2022-10-22 DIAGNOSIS — F419 Anxiety disorder, unspecified: Secondary | ICD-10-CM | POA: Diagnosis not present

## 2022-10-22 DIAGNOSIS — R339 Retention of urine, unspecified: Secondary | ICD-10-CM | POA: Diagnosis not present

## 2022-10-22 DIAGNOSIS — R2689 Other abnormalities of gait and mobility: Secondary | ICD-10-CM | POA: Diagnosis not present

## 2022-10-22 DIAGNOSIS — Z1152 Encounter for screening for COVID-19: Secondary | ICD-10-CM | POA: Diagnosis not present

## 2022-10-22 DIAGNOSIS — G20A1 Parkinson's disease without dyskinesia, without mention of fluctuations: Secondary | ICD-10-CM | POA: Diagnosis not present

## 2022-10-22 DIAGNOSIS — A4151 Sepsis due to Escherichia coli [E. coli]: Secondary | ICD-10-CM | POA: Diagnosis present

## 2022-10-22 DIAGNOSIS — R008 Other abnormalities of heart beat: Secondary | ICD-10-CM | POA: Diagnosis not present

## 2022-10-22 DIAGNOSIS — Z1611 Resistance to penicillins: Secondary | ICD-10-CM | POA: Diagnosis present

## 2022-10-22 DIAGNOSIS — E871 Hypo-osmolality and hyponatremia: Secondary | ICD-10-CM | POA: Diagnosis not present

## 2022-10-22 DIAGNOSIS — Z515 Encounter for palliative care: Secondary | ICD-10-CM | POA: Diagnosis not present

## 2022-10-22 DIAGNOSIS — E785 Hyperlipidemia, unspecified: Secondary | ICD-10-CM | POA: Diagnosis not present

## 2022-10-22 DIAGNOSIS — Z8249 Family history of ischemic heart disease and other diseases of the circulatory system: Secondary | ICD-10-CM | POA: Diagnosis not present

## 2022-10-22 DIAGNOSIS — D649 Anemia, unspecified: Secondary | ICD-10-CM | POA: Diagnosis not present

## 2022-10-22 DIAGNOSIS — F0280 Dementia in other diseases classified elsewhere without behavioral disturbance: Secondary | ICD-10-CM | POA: Diagnosis present

## 2022-10-22 DIAGNOSIS — R414 Neurologic neglect syndrome: Secondary | ICD-10-CM | POA: Diagnosis present

## 2022-10-22 DIAGNOSIS — Z9181 History of falling: Secondary | ICD-10-CM | POA: Diagnosis not present

## 2022-10-22 DIAGNOSIS — R54 Age-related physical debility: Secondary | ICD-10-CM | POA: Diagnosis present

## 2022-10-22 DIAGNOSIS — R4182 Altered mental status, unspecified: Secondary | ICD-10-CM | POA: Diagnosis present

## 2022-10-22 DIAGNOSIS — R627 Adult failure to thrive: Secondary | ICD-10-CM | POA: Diagnosis present

## 2022-10-22 DIAGNOSIS — Z823 Family history of stroke: Secondary | ICD-10-CM | POA: Diagnosis not present

## 2022-10-22 DIAGNOSIS — R4189 Other symptoms and signs involving cognitive functions and awareness: Secondary | ICD-10-CM | POA: Diagnosis not present

## 2022-10-22 DIAGNOSIS — R41841 Cognitive communication deficit: Secondary | ICD-10-CM | POA: Diagnosis not present

## 2022-10-22 DIAGNOSIS — N390 Urinary tract infection, site not specified: Secondary | ICD-10-CM

## 2022-10-22 DIAGNOSIS — R1312 Dysphagia, oropharyngeal phase: Secondary | ICD-10-CM | POA: Diagnosis not present

## 2022-10-22 DIAGNOSIS — E7800 Pure hypercholesterolemia, unspecified: Secondary | ICD-10-CM | POA: Diagnosis present

## 2022-10-22 DIAGNOSIS — Z720 Tobacco use: Secondary | ICD-10-CM | POA: Diagnosis not present

## 2022-10-22 DIAGNOSIS — M545 Low back pain, unspecified: Secondary | ICD-10-CM | POA: Diagnosis present

## 2022-10-22 DIAGNOSIS — Z7189 Other specified counseling: Secondary | ICD-10-CM | POA: Diagnosis not present

## 2022-10-22 DIAGNOSIS — G4700 Insomnia, unspecified: Secondary | ICD-10-CM | POA: Diagnosis not present

## 2022-10-22 DIAGNOSIS — Z79899 Other long term (current) drug therapy: Secondary | ICD-10-CM | POA: Diagnosis not present

## 2022-10-22 DIAGNOSIS — R441 Visual hallucinations: Secondary | ICD-10-CM | POA: Diagnosis not present

## 2022-10-22 LAB — BASIC METABOLIC PANEL
Anion gap: 7 (ref 5–15)
BUN: 13 mg/dL (ref 8–23)
CO2: 23 mmol/L (ref 22–32)
Calcium: 8.5 mg/dL — ABNORMAL LOW (ref 8.9–10.3)
Chloride: 106 mmol/L (ref 98–111)
Creatinine, Ser: 0.86 mg/dL (ref 0.61–1.24)
GFR, Estimated: >60 mL/min (ref >60)
Glucose, Bld: 108 mg/dL — ABNORMAL HIGH (ref 70–99)
Potassium: 3.6 mmol/L (ref 3.5–5.1)
Sodium: 136 mmol/L (ref 135–145)

## 2022-10-22 LAB — CBC
HCT: 27.5 % — ABNORMAL LOW (ref 39.0–52.0)
Hemoglobin: 8.8 g/dL — ABNORMAL LOW (ref 13.0–17.0)
MCH: 20.7 pg — ABNORMAL LOW (ref 26.0–34.0)
MCHC: 32 g/dL (ref 30.0–36.0)
MCV: 64.6 fL — ABNORMAL LOW (ref 80.0–100.0)
Platelets: 151 10*3/uL (ref 150–400)
RBC: 4.26 MIL/uL (ref 4.22–5.81)
RDW: 15.3 % (ref 11.5–15.5)
WBC: 16.2 10*3/uL — ABNORMAL HIGH (ref 4.0–10.5)
nRBC: 0 % (ref 0.0–0.2)

## 2022-10-22 LAB — BLOOD CULTURE ID PANEL (REFLEXED) - BCID2

## 2022-10-22 LAB — I-STAT CG4 LACTIC ACID, ED: Lactic Acid, Venous: 0.9 mmol/L (ref 0.5–1.9)

## 2022-10-22 MED ORDER — SODIUM CHLORIDE 0.9 % IV SOLN
2.0000 g | Freq: Three times a day (TID) | INTRAVENOUS | Status: DC
  Administered 2022-10-22 – 2022-10-23 (×2): 2 g via INTRAVENOUS
  Filled 2022-10-22 (×2): qty 12.5

## 2022-10-22 MED ORDER — ATORVASTATIN CALCIUM 10 MG PO TABS
20.0000 mg | ORAL_TABLET | Freq: Every evening | ORAL | Status: DC
  Administered 2022-10-22 – 2022-11-04 (×14): 20 mg via ORAL
  Filled 2022-10-22 (×14): qty 2

## 2022-10-22 MED ORDER — LORATADINE 10 MG PO TABS
10.0000 mg | ORAL_TABLET | Freq: Every day | ORAL | Status: DC
  Administered 2022-10-22 – 2022-11-04 (×14): 10 mg via ORAL
  Filled 2022-10-22 (×14): qty 1

## 2022-10-22 MED ORDER — SODIUM CHLORIDE 0.9 % IV SOLN
1.0000 g | INTRAVENOUS | Status: DC
  Administered 2022-10-22: 1 g via INTRAVENOUS
  Filled 2022-10-22: qty 10

## 2022-10-22 MED ORDER — TRAZODONE HCL 50 MG PO TABS
50.0000 mg | ORAL_TABLET | Freq: Every day | ORAL | Status: DC
  Administered 2022-10-22 – 2022-10-26 (×6): 50 mg via ORAL
  Filled 2022-10-22 (×6): qty 1

## 2022-10-22 MED ORDER — ESCITALOPRAM OXALATE 20 MG PO TABS
20.0000 mg | ORAL_TABLET | Freq: Every day | ORAL | Status: DC
  Administered 2022-10-22 – 2022-11-03 (×13): 20 mg via ORAL
  Filled 2022-10-22 (×13): qty 1

## 2022-10-22 MED ORDER — SODIUM CHLORIDE 0.9 % IV SOLN: 2.0000 g | Freq: Three times a day (TID) | INTRAVENOUS | Status: DC

## 2022-10-22 MED ORDER — LACTATED RINGERS IV SOLN: INTRAVENOUS | Status: AC

## 2022-10-22 MED ORDER — ACETAMINOPHEN 325 MG PO TABS
650.0000 mg | ORAL_TABLET | Freq: Four times a day (QID) | ORAL | Status: DC | PRN
  Administered 2022-10-23 – 2022-11-03 (×8): 650 mg via ORAL
  Filled 2022-10-22 (×9): qty 2

## 2022-10-22 MED ORDER — SODIUM CHLORIDE 0.9 % IV SOLN: 2.0000 g | INTRAVENOUS | Status: DC

## 2022-10-22 MED ORDER — ENOXAPARIN SODIUM 40 MG/0.4ML IJ SOSY
40.0000 mg | PREFILLED_SYRINGE | INTRAMUSCULAR | Status: DC
  Administered 2022-10-22 – 2022-11-04 (×14): 40 mg via SUBCUTANEOUS
  Filled 2022-10-22 (×14): qty 0.4

## 2022-10-22 MED ORDER — IBUPROFEN 400 MG PO TABS
400.0000 mg | ORAL_TABLET | Freq: Once | ORAL | Status: AC
  Administered 2022-10-22: 400 mg via ORAL
  Filled 2022-10-22: qty 1

## 2022-10-22 MED ORDER — POLYETHYLENE GLYCOL 3350 17 G PO PACK
17.0000 g | PACK | Freq: Every day | ORAL | Status: DC
  Administered 2022-10-22 – 2022-10-27 (×6): 17 g via ORAL
  Filled 2022-10-22 (×6): qty 1

## 2022-10-22 MED ORDER — SODIUM CHLORIDE 0.9 % IV SOLN: 1.0000 g | Freq: Once | INTRAVENOUS | Status: DC

## 2022-10-22 MED ORDER — CARBIDOPA-LEVODOPA 25-100 MG PO TABS
2.0000 | ORAL_TABLET | Freq: Three times a day (TID) | ORAL | Status: DC
  Administered 2022-10-22 – 2022-11-04 (×42): 2 via ORAL
  Filled 2022-10-22 (×43): qty 2

## 2022-10-22 NOTE — TOC CM/SW Note (Signed)
Transition of Care [facility]) - Inpatient Brief Assessment   Patient Details  Name: Tony [name] MRN: [id number] Date of Birth: [birth year]  Transition of Care [facility]) CM/SW Contact:    Tom-Johnson, Hershal Coria, RN Phone Number: 10/22/2022, 12:13 PM   Clinical Narrative:  Patient presented to the ED with Altered Mental Status and Weakness. Found to have Sepsis 2/2 UTI. On IV abx.   From [facility]. Wife, Enid Derry does not live with him, [place].  Has five supportive children, two lives locally.  Patient has Hx of Parkinson's with Lt side weakness, arm greater.  Enid Derry states patient had a fall a month ago and sustained Rib Fxs. Was admitted and went to [facility] for Rehab. Patient returned to [facility] after two weeks of Therapy.  Has a rollator, w/c and handicapped bathroom.  PCP is Avva, Ravisankar, MD and uses [facility] at [employer].      CM called the 72 hr Notification Hotline at the [place]. Notification# [phone].   Patient not Medically ready for discharge.  Awaiting PT/OT eval for disposition.  CM will continue to follow as patient progresses with care towards discharge.       Transition of Care Asessment: Insurance and Status: Insurance coverage has been reviewed Patient has primary care physician: Yes Home environment has been reviewed: Assisted Living Prior level of function:: Modified assistance Prior/Current Home Services: Current home services (From ALF) Social Determinants of Health Reivew: SDOH reviewed no interventions necessary Readmission risk has been reviewed: Yes Transition of care needs: transition of care needs identified, TOC will continue to follow

## 2022-10-22 NOTE — Progress Notes (Addendum)
    Patient: Tony [name] WUX:[id number] DOB: 1945/11/06      Brief hospital course: Tony [name] is a 77 y.o. 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   Patient reports that this past week he was seeing double. Has felt weak. Previously could ambulate with walker and now have trouble even getting out of bed.  In the ER febrile to 103F, tachycardic.  WBC 18.  UA with nitrites, bacteria and leuks.    Started on Rocephin and admitted.  Blood cultures growing GNRs.    This is a no charge note, for further details, please see the H&P by my partner, Dr. Cyndia Bent from earlier today.    Principal Problem:   Sepsis secondary to gram negative rod bacteremia due to UTI [facility]) Active Problems:   HLD (hyperlipidemia)   Parkinson's disease   Hyponatremia   Normocytic anemia   Blood cultures positive Continue IV antibiotics Continue fluids today PT/OT Continue Sinemet     Physical Exam: BP 119/76 (BP Location: Right Arm)   Pulse 85   Temp 98.6 F (37 C) (Oral)   Resp 16   Ht 6' (1.829 m)   Wt 88 kg   SpO2 98%   BMI 26.31 kg/m   Patient seen and examined.     Family Communication: Wife        Author: Alberteen Sam, MD 10/22/2022 2:43 PM

## 2022-10-22 NOTE — Assessment & Plan Note (Signed)
-  Mild hyponatremia -continue to monitor after IV fluids

## 2022-10-22 NOTE — Assessment & Plan Note (Signed)
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

## 2022-10-22 NOTE — H&P (Signed)
History and Physical    Patient: Tony [name] ZOX:[id number] DOB: 09/27/1945 DOA: 10/21/2022 DOS: the patient was seen and examined on 10/22/2022 PCP: Chilton Greathouse, MD  Patient coming from: Home  Chief Complaint:  Chief Complaint  Patient presents with   Altered Mental Status   HPI: Tony [name] is a 77 y.o. 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  Patient reports that this past week he was seeing double. Has felt weak. Previously could ambulate with walker and now have trouble even getting out of bed. Denies any nausea, vomiting or diarrhea. No abdominal pain. No dysuria, increase frequency or urgency.   In the ED, he was febrile up to 103.45F, tachycardic and tachypneic and normotensive on room air.  CBC with leukocytosis of 18.3, Hgb of 10.1 which is his baseline. Lactate within normal limits.   Mild hyponatremia of 133, creatinine normal at 0.91.   UA grossly positive with large hemoglobin, positive nitrate, large leukocyte and many bacteria.  He was given IV fluid resuscitation and started on IV Rocephin.  Hospitalist consulted for admission.  Review of Systems: As mentioned in the history of present illness. All other systems reviewed and are negative. Past Medical History:  Diagnosis Date   Anxiety    Boil of buttock    High cholesterol    Imbalance    Low back pain    Parkinsons    Past Surgical History:  Procedure Laterality Date   CYST REMOVAL LEG  2014   Social History:  reports that he has never smoked. He uses smokeless tobacco. He reports current alcohol use. He reports that he does not use drugs.  Allergies  Allergen Reactions   Shellfish Allergy Hives and Swelling    Other Reaction(s): Unknown   Shellfish-Derived Products Hives and Swelling    Family History  Problem Relation Age of Onset    Stroke Mother    Heart disease Father    Heart attack Paternal Grandfather    Sleep apnea Neg Hx     Prior to Admission medications   Medication Sig Start Date End Date Taking? Authorizing Provider  acetaminophen (TYLENOL) 325 MG tablet Take 2 tablets (650 mg total) by mouth every 6 (six) hours as needed. 09/09/22 09/09/23  Zigmund Daniel., MD  atorvastatin (LIPITOR) 20 MG tablet Take 20 mg by mouth every evening.    [provider]  carbidopa-levodopa (SINEMET IR) 25-100 MG tablet Take 2 tablets by mouth 3 (three) times daily. Patient taking differently: Take 2 tablets by mouth See admin instructions. Take 2 tablets by mouth at 8 AM, 2 PM, and 8 PM 05/16/22   Glean Salvo, NP  cetirizine (ZYRTEC) 10 MG tablet Take 10 mg by mouth daily.    [provider]  diclofenac Sodium (VOLTAREN ARTHRITIS PAIN) 1 % GEL Apply 2 g topically 4 (four) times daily as needed (rib pain). 09/09/22   Zigmund Daniel., MD  donepezil (ARICEPT) 10 MG tablet Take 1 tablet (10 mg total) by mouth in the morning. 05/16/22   Glean Salvo, NP  escitalopram (LEXAPRO) 20 MG tablet Take 20 mg by mouth daily.    [provider]  lidocaine (LIDODERM) 5 % Place 3 patches onto the skin daily. Apply to rib pain.  Remove & Discard patch within 12 hours or as directed by MD 09/09/22   Zigmund Daniel., MD  Multiple Vitamins-Minerals (CERTAVITE SENIOR/ANTIOXIDANT) TABS Take 1 tablet by mouth daily with breakfast.    [provider]  polyethylene glycol (MIRALAX / GLYCOLAX) 17 g packet Take 17 g by mouth daily. Titrate to 1 soft bowel movement daily 09/09/22   Zigmund Daniel., MD  traZODone (DESYREL) 50 MG tablet Take 50 mg by mouth at bedtime. 09/24/21   [provider]    Physical Exam: Vitals:   10/22/22 0000 10/22/22 0036 10/22/22 0100 10/22/22 0116  BP: (!) 100/55  (!) 118/58   Pulse: 87  91   Resp: 20  17   Temp:  98.5 F (36.9 C)  98.2 F (36.8 C)   TempSrc:  Oral    SpO2: 100%  100%   Weight:      Height:       Constitutional: NAD, calm, comfortable, elderly male laying in bed  Eyes: PERRL, lids and conjunctivae normal ENMT: Mucous membranes are moist.  Neck: normal, supple Respiratory: clear to auscultation bilaterally, no wheezing, no crackles. Normal respiratory effort. No accessory muscle use.  Cardiovascular: Regular rate and rhythm, no murmurs / rubs / gallops. No extremity edema.  Abdomen: no tenderness, soft, non-distended. Bowel sounds positive.  Musculoskeletal: no clubbing / cyanosis. No joint deformity upper and lower extremities. Good ROM, no contractures. Normal muscle tone.  Skin: no rashes, lesions, ulcers.  Neurologic: CN 2-12 grossly intact. Slow and delayed speech. Able to lift bilateral LE against gravity Psychiatric: Normal judgment and insight. Alert and oriented x 3. Normal mood.   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 Inpatient status is judged to be reasonable and necessary in order to provide the required intensity of service to ensure the patient's safety. The patient's presenting symptoms, physical exam findings, and initial radiographic and laboratory data in the context of their chronic comorbidities is felt to place them at high risk for further clinical deterioration.  Furthermore, it is not anticipated that the patient will be medically stable for discharge from the hospital within 2 midnights of admission.   * I certify that at the point of admission it is my clinical judgment that the patient will require inpatient hospital care spanning beyond 2 midnights from the point of admission due to high intensity of service, high risk for further deterioration and high frequency of surveillance required.*  Author: Anselm Jungling, DO 10/22/2022 1:26 AM  For on call review [email or web address].

## 2022-10-22 NOTE — Evaluation (Signed)
Physical Therapy Evaluation Patient Details Name: Tony [name] MRN: [id number] DOB: 1945/08/06 Today's Date: 10/22/2022  History of Present Illness  77 y.o. male who presented 10/21/22 with altered mental status and weakness. 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 PTA patient was living in ALF, having assist with ADLs (except able to feed himself), and walking in hallways with rollator and supervision of wife or daughter.  Pt currently requires up to mod assist to ambulate 25 ft with RW with poor endurance, increasing left lean as he fatigued, and required assist to maneuver RW. Recommend continued post-acute therapies in residential setting with <3 hrs therapy/day at incr frequency.  Anticipate patient will benefit from PT to address problems listed below.Will continue to follow acutely to maximize functional mobility independence and safety.           If plan is discharge home, recommend the following: Two people to help with walking and/or transfers;Assistance with feeding;Direct supervision/assist for medications management;Direct supervision/assist for financial management;Assist for transportation;Supervision due to cognitive status   Can travel by private vehicle   No    Equipment Recommendations None recommended by PT  Recommendations for Other Services       Functional Status Assessment Patient has had a recent decline in their functional status and demonstrates the ability to make significant improvements in function in a reasonable and predictable amount of time.     Precautions / Restrictions Precautions Precautions: Fall Precaution Comments: wife mentioned 2 recent falls Restrictions Weight Bearing Restrictions: No      Mobility  Bed Mobility Overal bed mobility:  Needs Assistance Bed Mobility: Rolling, Sidelying to Sit, Sit to Supine Rolling: Min assist, Used rails Sidelying to sit: Min assist, Used rails   Sit to supine: Mod assist   General bed mobility comments: step by step cues for sequencing/technique; assist to move legs over EOB and raise torso; assist to raise legs up onto bed    Transfers Overall transfer level: Needs assistance Equipment used: Rolling walker (2 wheels) Transfers: Sit to/from Stand Sit to Stand: Mod assist           General transfer comment: assist for anterior translation over BOS; wife counts for pt "1,2,3" and cues "nose over toes"; she reports therapy has been working on getting him to push off surface and reach back for surface prior to sitting; stood x 3 reps as bed being changed    Ambulation/Gait Ambulation/Gait assistance: Mod assist Gait Distance (Feet): 25 Feet Assistive device: Rolling walker (2 wheels) Gait Pattern/deviations: Step-to pattern, Decreased stride length, Trunk flexed, Narrow base of support   Gait velocity interpretation: <1.8 ft/sec, indicate of risk for recurrent falls   General Gait Details: initial 12 ft min assist with RW, however pt with significant difficulty turning around to return to room/bed; as fatigued, began to lean to his left  Stairs            Wheelchair Mobility     Tilt Bed    Modified Rankin (Stroke Patients Only)       Balance Overall balance assessment: Needs assistance Sitting-balance support: Bilateral upper extremity supported, Feet supported Sitting balance-Leahy Scale: Poor Sitting balance - Comments: with fatigue, leans left Postural control: Left lateral lean Standing balance support: Bilateral upper extremity supported, Reliant on assistive device for balance Standing balance-Leahy Scale: Poor  Pertinent Vitals/Pain Pain Assessment Pain Assessment: No/denies pain    Home Living  Family/patient expects to be discharged to:: Skilled nursing facility                 Home Equipment: Rollator (4 wheels);Rolling Walker (2 wheels);Transport chair      Prior Function Prior Level of Function : Needs assist;History of Falls (last six months);Patient poor historian/Family not available             Mobility Comments: Ambulates with Rollator to dining room, often accompanies by spouse or daughter. Uses Rollator inside his apartment. ADLs Comments: Wear Depends at baseline. Controls bowel but not bladder. Assisted with toileting, bathing and dressing.     Extremity/Trunk Assessment   Upper Extremity Assessment Upper Extremity Assessment: Defer to OT evaluation    Lower Extremity Assessment Lower Extremity Assessment: Generalized weakness (LLE weaker than RLE)    Cervical / Trunk Assessment Cervical / Trunk Assessment: Other exceptions Cervical / Trunk Exceptions: leans to his left in sitting and as fatigues with walking  Communication   Communication Communication: Difficulty following commands/understanding Cueing Techniques: Verbal cues;Gestural cues;Tactile cues;Visual cues  Cognition Arousal: Lethargic Behavior During Therapy: Flat affect Overall Cognitive Status: Impaired/Different from baseline Area of Impairment: Attention, Memory, Following commands, Safety/judgement, Awareness, Problem solving                   Current Attention Level: Sustained Memory: Decreased short-term memory (wife corrected his responses numerous times) Following Commands: Follows one step commands inconsistently, Follows one step commands with increased time Safety/Judgement: Decreased awareness of safety, Decreased awareness of deficits Awareness: Intellectual Problem Solving: Slow processing, Decreased initiation, Difficulty sequencing, Requires verbal cues, Requires tactile cues General Comments: pt with difficulty sequencing how to get OOB and how to come to  standing with RW; requires max cues (including tactile)        General Comments General comments (skin integrity, edema, etc.): Wife present and helpful throughout. Patient's condom catheter was off on arrival with pt and bed soaked. Incr time for staff to respond to call light and come to assist with changing linens.    Exercises     Assessment/Plan    PT Assessment Patient needs continued PT services  PT Problem List Decreased strength;Decreased activity tolerance;Decreased balance;Decreased mobility;Decreased coordination;Decreased cognition;Decreased knowledge of use of DME;Decreased safety awareness;Decreased knowledge of precautions       PT Treatment Interventions DME instruction;Gait training;Functional mobility training;Therapeutic activities;Therapeutic exercise;Balance training;Cognitive remediation;Patient/family education;Neuromuscular re-education    PT Goals (Current goals can be found in the Care Plan section)  Acute Rehab PT Goals Patient Stated Goal: unable to state; wife hopes he will become ambulatory enough to return to [place] house PT Goal Formulation: With family Time For Goal Achievement: 11/05/22 Potential to Achieve Goals: Fair    Frequency Min 1X/week     Co-evaluation               AM-PAC PT "6 Clicks" Mobility  Outcome Measure Help needed turning from your back to your side while in a flat bed without using bedrails?: A Little Help needed moving from lying on your back to sitting on the side of a flat bed without using bedrails?: A Lot Help needed moving to and from a bed to a chair (including a wheelchair)?: A Lot Help needed standing up from a chair using your arms (e.g., wheelchair or bedside chair)?: A Lot Help needed to walk in hospital room?: A Lot Help needed climbing 3-5 steps  with a railing? : Total 6 Click Score: 12    End of Session Equipment Utilized During Treatment: Gait belt Activity Tolerance: Patient limited by  fatigue Patient left: in bed;with call bell/phone within reach;with bed alarm set;with family/visitor present Nurse Communication: Mobility status PT Visit Diagnosis: Unsteadiness on feet (R26.81);Other abnormalities of gait and mobility (R26.89);History of falling (Z91.81);Muscle weakness (generalized) (M62.81)    Time: 1610-9604 PT Time Calculation (min) (ACUTE ONLY): 41 min   Charges:   PT Evaluation $PT Eval Moderate Complexity: 1 Mod PT Treatments $Gait Training: 8-22 mins PT General Charges $$ ACUTE PT VISIT: 1 Visit          Jerolyn Center, PT Acute Rehabilitation Services  Office [phone]   Zena Amos 10/22/2022, 12:49 PM

## 2022-10-22 NOTE — Hospital Course (Signed)
Tony [name] is a 77 y.o. 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   Patient reports that this past week he was seeing double. Has felt weak. Previously could ambulate with walker and now have trouble even getting out of bed.  In the ER febrile to 103F, tachycardic.  WBC 18.  UA with nitrites, bacteria and leuks.    Started on Rocephin and admitted.

## 2022-10-22 NOTE — Progress Notes (Addendum)
PHARMACY - PHYSICIAN COMMUNICATION CRITICAL VALUE ALERT - BLOOD CULTURE IDENTIFICATION (BCID)  BCID result: 1/3 E.coli (with no resistance)  Name of physician (or Provider) Contacted: Dr. Joen Laura  Changes to prescribed antibiotics: Currently on ceftriaxone 1g switched to cefepime 2g q8h after discussion with Dr. Maryfrances Bunnell    Contains abnormal data Blood Culture ID Panel (Reflexed) 0 Result Notes    Component Ref Range & Units 1 d ago  Enterococcus faecalis NOT DETECTED NOT DETECTED  Enterococcus Faecium NOT DETECTED NOT DETECTED  Listeria monocytogenes NOT DETECTED NOT DETECTED  Staphylococcus species NOT DETECTED NOT DETECTED  Staphylococcus aureus (BCID) NOT DETECTED NOT DETECTED  Staphylococcus epidermidis NOT DETECTED NOT DETECTED  Staphylococcus lugdunensis NOT DETECTED NOT DETECTED  Streptococcus species NOT DETECTED NOT DETECTED  Streptococcus agalactiae NOT DETECTED NOT DETECTED  Streptococcus pneumoniae NOT DETECTED NOT DETECTED  Streptococcus pyogenes NOT DETECTED NOT DETECTED  A.calcoaceticus-baumannii NOT DETECTED NOT DETECTED  Bacteroides fragilis NOT DETECTED NOT DETECTED  Enterobacterales NOT DETECTED DETECTED Abnormal   Comment: Enterobacterales represent a large order of gram negative bacteria, not a single organism. CRITICAL RESULT CALLED TO, READ BACK BY AND VERIFIED WITH: PHARMD Francesco Provencal ON 10/22/22 @ 1714 [facility] DRT  Enterobacter cloacae complex NOT DETECTED NOT DETECTED  Escherichia coli NOT DETECTED DETECTED Abnormal   Comment: CRITICAL RESULT CALLED TO, READ BACK BY AND VERIFIED WITH: PHARMD Kennley Schwandt ON 10/22/22 @ 1714 [facility] DRT  Klebsiella aerogenes NOT DETECTED NOT DETECTED  Klebsiella oxytoca NOT DETECTED NOT DETECTED  Klebsiella pneumoniae NOT DETECTED NOT DETECTED  Proteus species NOT DETECTED NOT DETECTED  Salmonella species NOT DETECTED NOT DETECTED  Serratia marcescens NOT DETECTED NOT DETECTED  Haemophilus  influenzae NOT DETECTED NOT DETECTED  Neisseria meningitidis NOT DETECTED NOT DETECTED  Pseudomonas aeruginosa NOT DETECTED NOT DETECTED  Stenotrophomonas maltophilia NOT DETECTED NOT DETECTED  Candida albicans NOT DETECTED NOT DETECTED  Candida auris NOT DETECTED NOT DETECTED  Candida glabrata NOT DETECTED NOT DETECTED  Candida krusei NOT DETECTED NOT DETECTED  Candida parapsilosis NOT DETECTED NOT DETECTED  Candida tropicalis NOT DETECTED NOT DETECTED  Cryptococcus neoformans/gattii NOT DETECTED NOT DETECTED  CTX-M ESBL NOT DETECTED NOT DETECTED  Carbapenem resistance IMP NOT DETECTED NOT DETECTED  Carbapenem resistance KPC NOT DETECTED NOT DETECTED  Carbapenem resistance NDM NOT DETECTED NOT DETECTED  Carbapenem resist OXA 48 LIKE NOT DETECTED NOT DETECTED  Carbapenem resistance VIM NOT DETECTED NOT DETECTED  Comment: Performed at [facility], [address] [place], [place] [address]  Resulting Agency [facility]     Thank you for allowing pharmacy to participate in this patient's care.  Marja Kays, PharmD Emergency Medicine Clinical Pharmacist 10/22/2022,5:20 PM
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
## 2022-10-22 NOTE — [place] FL2 (Signed)
[place] MEDICAID FL2 LEVEL OF CARE FORM     IDENTIFICATION  Patient Name: Tony [name] Birthdate: 07/17/1945 Sex: male Admission Date (Current Location): 10/21/2022  [facility] and [place] Number:  Producer, television/film/video and Address:  [facility], [address], [place], [place] [address]      Provider Number: [id number]  Attending Physician Name and Address:  Alberteen Sam, *  Relative Name and Phone Number:       Current Level of Care: Hospital Recommended Level of Care: Skilled Nursing Facility Prior Approval Number:    Date Approved/Denied:   PASRR Number: [id number]  Discharge Plan: SNF    Current Diagnoses: Patient Active Problem List   Diagnosis Date Noted   Sepsis secondary to UTI (HCC) 10/22/2022   Hyponatremia 10/22/2022   Normocytic anemia 10/22/2022   Multiple rib fractures 09/04/2022   Intractable pain 09/04/2022   History of falling 11/29/2021   Unsteadiness on feet 11/29/2021   Excessive sleepiness 11/08/2021   Cognitive impairment 12/25/2020   Anxiety 04/25/2020   Gait abnormality 03/30/2020   Parkinson's disease 03/30/2020   Memory loss 03/30/2020   Parkinsonism 03/30/2020   CELLULITIS AND ABSCESS OF FACE 02/01/2008   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/2006   HLD (hyperlipidemia) 06/30/2006   DIVERTICULOSIS, COLON 06/30/2006   SEBORRHEIC KERATOSIS 06/30/2006   BACK PAIN, LUMBAR 06/30/2006    Orientation RESPIRATION BLADDER Height & Weight     Self, Situation, Place  Normal External catheter Weight: 194 lb 0.1 oz (88 kg) Height:  6' (182.9 cm)  BEHAVIORAL SYMPTOMS/MOOD NEUROLOGICAL BOWEL NUTRITION STATUS      Continent Diet (See dc summary)  AMBULATORY STATUS COMMUNICATION OF NEEDS Skin   Extensive Assist Verbally Normal                       Personal Care Assistance Level of Assistance  Bathing, Feeding, Dressing Bathing Assistance: Maximum assistance Feeding assistance:  Independent Dressing Assistance: Maximum assistance     Functional Limitations Info  Sight, Hearing, Speech Sight Info: Adequate Hearing Info: Adequate Speech Info: Adequate    SPECIAL CARE FACTORS FREQUENCY  PT (By licensed PT), OT (By licensed OT)     PT Frequency: 5xweek OT Frequency: 5xweek            Contractures Contractures Info: Not present    Additional Factors Info  Code Status, Allergies Code Status Info: Full Allergies Info: Shellfish Allergy  Shellfish-derived Products           Current Medications (10/22/2022):  This is the current hospital active medication list Current Facility-Administered Medications  Medication Dose Route Frequency Provider Last Rate Last Admin   acetaminophen (TYLENOL) tablet 650 mg  650 mg Oral Q6H PRN Tu, Ching T, DO       atorvastatin (LIPITOR) tablet 20 mg  20 mg Oral QPM Tu, Ching T, DO       carbidopa-levodopa (SINEMET IR) 25-100 MG per tablet immediate release 2 tablet  2 tablet Oral TID WC Tu, Ching T, DO   2 tablet at 10/22/22 1206   cefTRIAXone (ROCEPHIN) 1 g in sodium chloride 0.9 % 100 mL IVPB  1 g Intravenous Q24H Tu, Ching T, DO 200 mL/hr at 10/22/22 0223 1 g at 10/22/22 0223   enoxaparin (LOVENOX) injection 40 mg  40 mg Subcutaneous Q24H Tu, Ching T, DO   40 mg at 10/22/22 0908   escitalopram (LEXAPRO) tablet 20 mg  20 mg  Oral Daily Tu, Ching T, DO   20 mg at 10/22/22 5409   lactated ringers infusion   Intravenous Continuous Barrett, Horald Chestnut, PA-C   Stopped at 10/22/22 1440   loratadine (CLARITIN) tablet 10 mg  10 mg Oral Daily Tu, Ching T, DO   10 mg at 10/22/22 0908   polyethylene glycol (MIRALAX / GLYCOLAX) packet 17 g  17 g Oral Daily Tu, Ching T, DO   17 g at 10/22/22 8119   traZODone (DESYREL) tablet 50 mg  50 mg Oral QHS Tu, Ching T, DO   50 mg at 10/22/22 1478     Discharge Medications: Please see discharge summary for a list of discharge medications.  Relevant Imaging Results:  Relevant Lab  Results:   Additional Information SSN: [id number]  Oletta Lamas, MSW, [facility]  Clinical Social Worker I

## 2022-10-22 NOTE — Assessment & Plan Note (Signed)
Continue statin. 

## 2022-10-22 NOTE — Assessment & Plan Note (Signed)
-  Patient presented with fever, tachycardia and leukocytosis with grossly positive UA -Continuous IV fluid hydration overnight - Continue IV Rocephin

## 2022-10-23 ENCOUNTER — Inpatient Hospital Stay (HOSPITAL_COMMUNITY): Payer: No Typology Code available for payment source

## 2022-10-23 DIAGNOSIS — A419 Sepsis, unspecified organism: Secondary | ICD-10-CM | POA: Diagnosis not present

## 2022-10-23 DIAGNOSIS — N390 Urinary tract infection, site not specified: Secondary | ICD-10-CM | POA: Diagnosis not present

## 2022-10-23 LAB — COMPREHENSIVE METABOLIC PANEL
ALT: 16 U/L (ref 0–44)
AST: 15 U/L (ref 15–41)
Albumin: 2.6 g/dL — ABNORMAL LOW (ref 3.5–5.0)
Alkaline Phosphatase: 59 U/L (ref 38–126)
Anion gap: 9 (ref 5–15)
BUN: 13 mg/dL (ref 8–23)
CO2: 23 mmol/L (ref 22–32)
Calcium: 8.2 mg/dL — ABNORMAL LOW (ref 8.9–10.3)
Chloride: 103 mmol/L (ref 98–111)
Creatinine, Ser: 0.8 mg/dL (ref 0.61–1.24)
GFR, Estimated: >60 mL/min (ref >60)
Glucose, Bld: 100 mg/dL — ABNORMAL HIGH (ref 70–99)
Potassium: 4 mmol/L (ref 3.5–5.1)
Sodium: 135 mmol/L (ref 135–145)
Total Bilirubin: 0.4 mg/dL (ref 0.3–1.2)
Total Protein: 5.7 g/dL — ABNORMAL LOW (ref 6.5–8.1)

## 2022-10-23 LAB — CBC
HCT: 26.7 % — ABNORMAL LOW (ref 39.0–52.0)
Hemoglobin: 8.4 g/dL — ABNORMAL LOW (ref 13.0–17.0)
MCH: 20.5 pg — ABNORMAL LOW (ref 26.0–34.0)
MCHC: 31.5 g/dL (ref 30.0–36.0)
MCV: 65.1 fL — ABNORMAL LOW (ref 80.0–100.0)
Platelets: 146 10*3/uL — ABNORMAL LOW (ref 150–400)
RBC: 4.1 MIL/uL — ABNORMAL LOW (ref 4.22–5.81)
RDW: 15.3 % (ref 11.5–15.5)
WBC: 9.3 10*3/uL (ref 4.0–10.5)
nRBC: 0 % (ref 0.0–0.2)

## 2022-10-23 LAB — GLUCOSE, CAPILLARY: Glucose-Capillary: 95 mg/dL (ref 70–99)

## 2022-10-23 MED ORDER — TAMSULOSIN HCL 0.4 MG PO CAPS
0.4000 mg | ORAL_CAPSULE | Freq: Every day | ORAL | Status: DC
  Administered 2022-10-23 – 2022-10-31 (×9): 0.4 mg via ORAL
  Filled 2022-10-23 (×9): qty 1

## 2022-10-23 MED ORDER — SODIUM CHLORIDE 0.9 % IV SOLN
2.0000 g | INTRAVENOUS | Status: DC
  Administered 2022-10-23 – 2022-10-24 (×2): 2 g via INTRAVENOUS
  Filled 2022-10-23 (×2): qty 20

## 2022-10-23 NOTE — Plan of Care (Signed)
  Problem: Safety: Goal: Ability to remain free from injury will improve Outcome: Progressing   

## 2022-10-23 NOTE — Progress Notes (Signed)
PROGRESS NOTE    Tony [name]  ION:[id number] DOB: 31-Jan-1946 DOA: 10/21/2022 PCP: Chilton Greathouse, MD    Brief Narrative:  77 year old with Parkinson's, cognitive impairment, left hemineglect, dyspraxia, hyperlipidemia and anxiety presented with altered mental status and weakness.  Could not walk with a walker.  He lives in assisted living facility [facility].  Admitted with acute UTI , temperature 103, tachycardic, WBC count 18.  Now with E. coli bacteremia.   Assessment & Plan:   Sepsis present on admission secondary to E. coli UTI and bacteremia.  Presented with fever, tachycardia and leukocytosis. Sepsis physiology improving. Blood cultures growing E. coli no resistance.  Urine cultures pending. Continue IV Rocephin.  Mobilize.   Check postvoid residual urine.  Also check renal ultrasound to rule out hydronephrosis or urinary retention.  Continue Flomax.  Chronic medical issues including Parkinson's, stable on Sinemet Hyperlipidemia, on statin  Continue antibiotics.  Continue mobilizing with PT OT.   DVT prophylaxis: enoxaparin (LOVENOX) injection 40 mg Start: 10/22/22 1000   Code Status: Full code Family Communication: Wife at the bedside Disposition Plan: Status is: Inpatient Remains inpatient appropriate because: Bacteremia, IV antibiotics     Consultants:  None  Procedures:  None  Antimicrobials:  Rocephin 8/27---   Subjective: Patient seen and examined.  Wife was at the bedside.  Patient tells me that he wants to go back to his assisted living place.  Denies any complaints but poor historian.  Denies difficulty urinating.  Remains afebrile overnight.  Objective: Vitals:   10/22/22 1534 10/22/22 2140 10/23/22 0533 10/23/22 0850  BP: 98/60 108/63 109/60 119/68  Pulse: 71 73 64 75  Resp: 16 18 14 18   Temp: 97.7 F (36.5 C) (!) 97.4 F (36.3 C) 98.8 F (37.1 C) 98.6 F (37 C)  TempSrc:  Axillary    SpO2: 98% 100% 98% 100%  Weight:      Height:         Intake/Output Summary (Last 24 hours) at 10/23/2022 1317 Last data filed at 10/23/2022 0900 Gross per 24 hour  Intake 940 ml  Output 875 ml  Net 65 ml   Filed Weights   10/21/22 1849  Weight: 88 kg    Examination:  General exam: Appears calm and comfortable  Chronically sick looking.  Frail and debilitated gentleman.  Not in any distress. Respiratory system: No added sounds. Cardiovascular system: S1 & S2 heard, RRR.  Gastrointestinal system: Abdomen is nondistended, soft and nontender. No organomegaly or masses felt. Normal bowel sounds heard. Central nervous system: Alert and oriented. No focal neurological deficits.  Generalized weakness.    Data Reviewed: I have personally reviewed following labs and imaging studies  CBC: Recent Labs  Lab 10/21/22 1850 10/21/22 1906 10/22/22 1001 10/23/22 0503  WBC 18.3*  --  16.2* 9.3  HGB 10.1* 10.9* 8.8* 8.4*  HCT 32.0* 32.0* 27.5* 26.7*  MCV 66.5*  --  64.6* 65.1*  PLT 191  --  151 146*   Basic Metabolic Panel: Recent Labs  Lab 10/21/22 1906 10/21/22 2017 10/22/22 1001 10/23/22 0503  NA 135 133* 136 135  K 4.3 3.6 3.6 4.0  CL 101 101 106 103  CO2  --  23 23 23   GLUCOSE 123* 131* 108* 100*  BUN 19 15 13 13   CREATININE 0.90 0.96 0.86 0.80  CALCIUM  --  8.9 8.5* 8.2*   GFR: Estimated Creatinine Clearance: 82.2 mL/min (by C-G formula based on SCr of 0.8 mg/dL). Liver Function Tests: Recent Labs  Lab 10/21/22 2017 10/23/22 0503  AST 11* 15  ALT 8 16  ALKPHOS 74 59  BILITOT 0.6 0.4  PROT 7.2 5.7*  ALBUMIN 3.5 2.6*   No results for input(s): "LIPASE", "AMYLASE" in the last 168 hours. No results for input(s): "AMMONIA" in the last 168 hours. Coagulation Profile: Recent Labs  Lab 10/21/22 2017  INR 1.1   Cardiac Enzymes: No results for input(s): "CKTOTAL", "CKMB", "CKMBINDEX", "TROPONINI" in the last 168 hours. BNP (last 3 results) No results for input(s): "PROBNP" in the last 8760 hours. HbA1C: No  results for input(s): "HGBA1C" in the last 72 hours. CBG: Recent Labs  Lab 10/21/22 1904 10/23/22 0737  GLUCAP 132* 95   Lipid Profile: No results for input(s): "CHOL", "HDL", "LDLCALC", "TRIG", "CHOLHDL", "LDLDIRECT" in the last 72 hours. Thyroid Function Tests: No results for input(s): "TSH", "T4TOTAL", "FREET4", "T3FREE", "THYROIDAB" in the last 72 hours. Anemia Panel: No results for input(s): "VITAMINB12", "FOLATE", "FERRITIN", "TIBC", "IRON", "RETICCTPCT" in the last 72 hours. Sepsis Labs: Recent Labs  Lab 10/21/22 1906 10/22/22 0035  LATICACIDVEN 1.8 0.9    Recent Results (from the past 240 hour(s))  Resp panel by RT-PCR (RSV, Flu A&B, Covid) Anterior Nasal Swab     Status: None   Collection Time: 10/21/22  8:17 PM   Specimen: Anterior Nasal Swab  Result Value Ref Range Status   SARS Coronavirus 2 by RT PCR NEGATIVE NEGATIVE Final   Influenza A by PCR NEGATIVE NEGATIVE Final   Influenza B by PCR NEGATIVE NEGATIVE Final    Comment: (NOTE) The Xpert Xpress SARS-CoV-2/FLU/RSV plus assay is intended as an aid in the diagnosis of influenza from Nasopharyngeal swab specimens and should not be used as a sole basis for treatment. Nasal washings and aspirates are unacceptable for Xpert Xpress SARS-CoV-2/FLU/RSV testing.  Fact Sheet for Patients: BloggerCourse.com  Fact Sheet for Healthcare Providers: SeriousBroker.it  This test is not yet approved or cleared by the [place] FDA and has been authorized for detection and/or diagnosis of SARS-CoV-2 by FDA under an Emergency Use Authorization (EUA). This EUA will remain in effect (meaning this test can be used) for the duration of the COVID-19 declaration under Section 564(b)(1) of the Act, 21 U.S.C. section 360bbb-3(b)(1), unless the authorization is terminated or revoked.     Resp Syncytial Virus by PCR NEGATIVE NEGATIVE Final    Comment: (NOTE) Fact Sheet for  Patients: BloggerCourse.com  Fact Sheet for Healthcare Providers: SeriousBroker.it  This test is not yet approved or cleared by the [place] FDA and has been authorized for detection and/or diagnosis of SARS-CoV-2 by FDA under an Emergency Use Authorization (EUA). This EUA will remain in effect (meaning this test can be used) for the duration of the COVID-19 declaration under Section 564(b)(1) of the Act, 21 U.S.C. section 360bbb-3(b)(1), unless the authorization is terminated or revoked.  Performed at [facility], [address], [place], [place] [address]   Blood Culture (routine x 2)     Status: Abnormal (Preliminary result)   Collection Time: 10/21/22  8:17 PM   Specimen: BLOOD RIGHT FOREARM  Result Value Ref Range Status   Specimen Description BLOOD RIGHT FOREARM  Final   Special Requests   Final    BOTTLES DRAWN AEROBIC AND ANAEROBIC Blood Culture results may not be optimal due to an excessive volume of blood received in culture bottles   Culture  Setup Time   Final    GRAM NEGATIVE RODS AEROBIC BOTTLE ONLY CRITICAL VALUE  NOTED.  VALUE IS CONSISTENT WITH PREVIOUSLY REPORTED AND CALLED VALUE.    Culture (A)  Final    ESCHERICHIA COLI SUSCEPTIBILITIES TO FOLLOW Performed at [facility], [address], [place], [place] [address]    Report Status PENDING  Incomplete  Blood Culture (routine x 2)     Status: Abnormal (Preliminary result)   Collection Time: 10/21/22  8:17 PM   Specimen: BLOOD LEFT HAND  Result Value Ref Range Status   Specimen Description BLOOD LEFT HAND  Final   Special Requests   Final    BOTTLES DRAWN AEROBIC ONLY Blood Culture adequate volume   Culture  Setup Time   Final    GRAM NEGATIVE RODS AEROBIC BOTTLE ONLY CRITICAL RESULT CALLED TO, READ BACK BY AND VERIFIED WITH: PHARMD SHELBY WISE ON 10/22/22 @ 1714 BYH DRT    Culture (A)  Final    ESCHERICHIA COLI SUSCEPTIBILITIES TO  FOLLOW Performed at [facility], [address], [place], [place] [address]    Report Status PENDING  Incomplete  Blood Culture ID Panel (Reflexed)     Status: Abnormal   Collection Time: 10/21/22  8:17 PM  Result Value Ref Range Status   Enterococcus faecalis NOT DETECTED NOT DETECTED Final   Enterococcus Faecium NOT DETECTED NOT DETECTED Final   Listeria monocytogenes NOT DETECTED NOT DETECTED Final   Staphylococcus species NOT DETECTED NOT DETECTED Final   Staphylococcus aureus (BCID) NOT DETECTED NOT DETECTED Final   Staphylococcus epidermidis NOT DETECTED NOT DETECTED Final   Staphylococcus lugdunensis NOT DETECTED NOT DETECTED Final   Streptococcus species NOT DETECTED NOT DETECTED Final   Streptococcus agalactiae NOT DETECTED NOT DETECTED Final   Streptococcus pneumoniae NOT DETECTED NOT DETECTED Final   Streptococcus pyogenes NOT DETECTED NOT DETECTED Final   A.calcoaceticus-baumannii NOT DETECTED NOT DETECTED Final   Bacteroides fragilis NOT DETECTED NOT DETECTED Final   Enterobacterales DETECTED (A) NOT DETECTED Final    Comment: Enterobacterales represent a large order of gram negative bacteria, not a single organism. CRITICAL RESULT CALLED TO, READ BACK BY AND VERIFIED WITH: PHARMD SHELBY WISE ON 10/22/22 @ 1714 BYH DRT    Enterobacter cloacae complex NOT DETECTED NOT DETECTED Final   Escherichia coli DETECTED (A) NOT DETECTED Final    Comment: CRITICAL RESULT CALLED TO, READ BACK BY AND VERIFIED WITH: PHARMD SHELBY WISE ON 10/22/22 @ 1714 BYH DRT    Klebsiella aerogenes NOT DETECTED NOT DETECTED Final   Klebsiella oxytoca NOT DETECTED NOT DETECTED Final   Klebsiella pneumoniae NOT DETECTED NOT DETECTED Final   Proteus species NOT DETECTED NOT DETECTED Final   Salmonella species NOT DETECTED NOT DETECTED Final   Serratia marcescens NOT DETECTED NOT DETECTED Final   Haemophilus influenzae NOT DETECTED NOT DETECTED Final   Neisseria meningitidis NOT DETECTED NOT  DETECTED Final   Pseudomonas aeruginosa NOT DETECTED NOT DETECTED Final   Stenotrophomonas maltophilia NOT DETECTED NOT DETECTED Final   Candida albicans NOT DETECTED NOT DETECTED Final   Candida auris NOT DETECTED NOT DETECTED Final   Candida glabrata NOT DETECTED NOT DETECTED Final   Candida krusei NOT DETECTED NOT DETECTED Final   Candida parapsilosis NOT DETECTED NOT DETECTED Final   Candida tropicalis NOT DETECTED NOT DETECTED Final   Cryptococcus neoformans/gattii NOT DETECTED NOT DETECTED Final   CTX-M ESBL NOT DETECTED NOT DETECTED Final   Carbapenem resistance IMP NOT DETECTED NOT DETECTED Final   Carbapenem resistance KPC NOT DETECTED NOT DETECTED Final   Carbapenem resistance  NDM NOT DETECTED NOT DETECTED Final   Carbapenem resist OXA 48 LIKE NOT DETECTED NOT DETECTED Final   Carbapenem resistance VIM NOT DETECTED NOT DETECTED Final    Comment: Performed at [facility], [address] [place], [place] [address]         Radiology Studies: DG Chest Portable 1 View  Result Date: 10/21/2022 CLINICAL DATA:  Fevers EXAM: PORTABLE CHEST 1 VIEW COMPARISON:  10/06/2022 FINDINGS: Cardiac shadow is stable. Aortic calcifications are again seen. Lungs are well aerated bilaterally. No focal infiltrate or effusion is seen. IMPRESSION: No acute abnormality noted. Electronically Signed   By: Alcide Clever M.D.   On: 10/21/2022 20:26        Scheduled Meds:  atorvastatin  20 mg Oral QPM   carbidopa-levodopa  2 tablet Oral TID WC   enoxaparin (LOVENOX) injection  40 mg Subcutaneous Q24H   escitalopram  20 mg Oral Daily   loratadine  10 mg Oral Daily   polyethylene glycol  17 g Oral Daily   tamsulosin  0.4 mg Oral Daily   traZODone  50 mg Oral QHS   Continuous Infusions:  cefTRIAXone (ROCEPHIN)  IV       LOS: 1 day    Time spent: 35 minutes    Dorcas Carrow, MD [facility]

## 2022-10-23 NOTE — Plan of Care (Signed)

## 2022-10-23 NOTE — Evaluation (Signed)
Occupational Therapy Evaluation Patient Details Name: Tony [name] MRN: [id number] DOB: [birth year] Today's Date: 10/23/2022   History of Present Illness 77 y.o. male who presented 10/21/22 with altered mental status and weakness. 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 Pt currently presenting with impaired balance and cognition, has trouble with RW management and continuously bumps into object on R hand side despite him saying he sees that it is there. With use of vestibular movements he is CGA for STS from elevated surfaces but needs min to mod A for ambulation. He also displays inattention to his L side. Pt would benefit from continued acute skilled OT services to address deficits and help transition to next level of care. Patient would benefit from post acute skilled rehab facility with <3 hours of therapy and 24/7 support       If plan is discharge home, recommend the following: A little help with walking and/or transfers;A lot of help with bathing/dressing/bathroom;Assistance with cooking/housework;Supervision due to cognitive status;Direct supervision/assist for financial management;Assist for transportation;Help with stairs or ramp for entrance;Direct supervision/assist for medications management    Functional Status Assessment  Patient has had a recent decline in their functional status and demonstrates the ability to make significant improvements in function in a reasonable and predictable amount of time.  Equipment Recommendations  None recommended by OT (defer to next level of care)    Recommendations for Other Services       Precautions / Restrictions Precautions Precautions: Fall Precaution Comments: wife mentioned 2 recent falls Restrictions Weight  Bearing Restrictions: No      Mobility Bed Mobility Overal bed mobility: Needs Assistance Bed Mobility: Sit to Supine, Supine to Sit     Supine to sit: Contact guard, Used rails Sit to supine: Contact guard assist, Used rails   General bed mobility comments: increased time needed. Cues to use bed rails to assist with bed mobility    Transfers Overall transfer level: Needs assistance Equipment used: Rolling walker (2 wheels) Transfers: Sit to/from Stand Sit to Stand: Contact guard assist, From elevated surface           General transfer comment: With use of vestibular movements and cues for nose over toes. Dynamic reaching prep activity as well that promote anterior weight shifts      Balance Overall balance assessment: Needs assistance Sitting-balance support: Bilateral upper extremity supported, Feet supported Sitting balance-Leahy Scale: Fair     Standing balance support: Bilateral upper extremity supported, Reliant on assistive device for balance Standing balance-Leahy Scale: Poor Standing balance comment: RW                           ADL either performed or assessed with clinical judgement   ADL Overall ADL's : Needs assistance/impaired Eating/Feeding: Set up;Sitting Eating/Feeding Details (indicate cue type and reason): supported sitting in a recliner Grooming: Sitting;Wash/dry face;Set up;Contact guard assist   Upper Body Bathing: Contact guard assist;Sitting Upper Body Bathing Details (indicate cue type and reason): cues may be needed to follow prompt Lower Body Bathing: Moderate assistance;Sitting/lateral leans   Upper Body Dressing : Minimal assistance;Sitting;Cueing for sequencing   Lower Body Dressing: Maximal assistance;Sitting/lateral leans;Sit to/from stand   Toilet Transfer: Minimal assistance;Rolling walker (2 wheels);Ambulation Toilet Transfer Details (indicate cue type and  reason): simulated based on ambulation in room Toileting-  Clothing Manipulation and Hygiene: Sit to/from stand;Moderate assistance       Functional mobility during ADLs: Rolling walker (2 wheels);Minimal assistance General ADL Comments: Pt needing Min A to ambulate with RW due to impaired managment of it. Upon fatigue pt needing Mod A to help sequence with return to sitting EOB     Vision Baseline Vision/History: 1 Wears glasses Additional Comments: Needs further assessment, reports no current problems but bumps into objects on R hand side when ambulating     Perception         Praxis         Pertinent Vitals/Pain Pain Assessment Pain Assessment: No/denies pain     Extremity/Trunk Assessment Upper Extremity Assessment Upper Extremity Assessment: Generalized weakness (Pt LUE noted to be less coordinate, gross grasp intact. L inattention)   Lower Extremity Assessment Lower Extremity Assessment:  (LLE weaker than RLE)   Cervical / Trunk Assessment Cervical / Trunk Assessment: Other exceptions   Communication Communication Communication: Difficulty following commands/understanding Cueing Techniques: Gestural cues;Verbal cues;Tactile cues;Visual cues   Cognition Arousal: Alert Behavior During Therapy: Flat affect Overall Cognitive Status: Impaired/Different from baseline Area of Impairment: Following commands, Safety/judgement, Awareness, Problem solving                       Following Commands: Follows one step commands inconsistently, Follows one step commands with increased time Safety/Judgement: Decreased awareness of safety, Decreased awareness of deficits Awareness: Intellectual Problem Solving: Slow processing, Decreased initiation, Difficulty sequencing, Requires verbal cues, Requires tactile cues General Comments: Pt noted to have difficulty with word finding. Needs cueing for safety and assist with RW     General Comments  Wife present,  Pt reports dizziness at end of session BP 98/65 sitting EOB but was  128/75 upon return to supine.    Exercises Other Exercises Other Exercises: Dynamic reaching from floor to prep for anterior WS Other Exercises: Marching in place seated EOB Other Exercises: Vestibular movements prompting opening and closing of the UB   Shoulder Instructions      Home Living Family/patient expects to be discharged to:: Skilled nursing facility                             Home Equipment: Rollator (4 wheels);Rolling Walker (2 wheels);Transport chair   Additional Comments: 6-7 falls in the last year due to losing balance      Prior Functioning/Environment Prior Level of Function : Needs assist;History of Falls (last six months);Patient poor historian/Family not available             Mobility Comments: Ambulates with Rollator to dining room, often accompanies by spouse or daughter. Uses Rollator inside his apartment. ADLs Comments: Wear Depends at baseline. Controls bowel but not bladder. Assisted with toileting, bathing and dressing.        OT Problem List: Decreased strength;Impaired balance (sitting and/or standing);Decreased safety awareness;Decreased coordination      OT Treatment/Interventions: Self-care/ADL training;Balance training;Therapeutic exercise;Therapeutic activities;Visual/perceptual remediation/compensation;DME and/or AE instruction;Patient/family education    OT Goals(Current goals can be found in the care plan section) Acute Rehab OT Goals Patient Stated Goal: To get better OT Goal Formulation: With patient Time For Goal Achievement: 11/06/22 Potential to Achieve Goals: Good ADL Goals Pt Will Perform Grooming: standing;with set-up;with supervision Pt Will Perform Upper Body Dressing: with supervision;with set-up;sitting Pt Will Perform Lower Body Dressing: with contact guard  assist;with adaptive equipment;sitting/lateral leans (AE prn) Pt Will Transfer to Toilet: with contact guard assist;ambulating Additional ADL Goal #1:  Pt will demonstrate improved attention to L side by independently locating 3/3 items for his grooming routine that are positioned on his L side.  OT Frequency: Min 1X/week    Co-evaluation              AM-PAC OT "6 Clicks" Daily Activity     Outcome Measure Help from another person eating meals?: A Little Help from another person taking care of personal grooming?: A Little Help from another person toileting, which includes using toliet, bedpan, or urinal?: A Little Help from another person bathing (including washing, rinsing, drying)?: A Lot Help from another person to put on and taking off regular upper body clothing?: A Little Help from another person to put on and taking off regular lower body clothing?: A Lot 6 Click Score: 16   End of Session Equipment Utilized During Treatment: Gait belt;Rolling walker (2 wheels) Nurse Communication: Mobility status  Activity Tolerance: Patient tolerated treatment well Patient left: in bed;with call bell/phone within reach;with bed alarm set;with family/visitor present  OT Visit Diagnosis: Unsteadiness on feet (R26.81);Other abnormalities of gait and mobility (R26.89);History of falling (Z91.81);Muscle weakness (generalized) (M62.81)                Time: 1453-1540 OT Time Calculation (min): 47 min Charges:  OT General Charges $OT Visit: 1 Visit OT Evaluation $OT Eval Moderate Complexity: 1 Mod OT Treatments $Self Care/Home Management : 8-22 mins $Therapeutic Activity: 8-22 mins  10/23/2022  AB, OTR/L  Acute Rehabilitation Services  Office: [phone]   Tristan Schroeder 10/23/2022, 4:01 PM

## 2022-10-24 DIAGNOSIS — A419 Sepsis, unspecified organism: Secondary | ICD-10-CM | POA: Diagnosis not present

## 2022-10-24 DIAGNOSIS — N390 Urinary tract infection, site not specified: Secondary | ICD-10-CM | POA: Diagnosis not present

## 2022-10-24 LAB — CULTURE, BLOOD (ROUTINE X 2): Special Requests: ADEQUATE

## 2022-10-24 MED ORDER — SULFAMETHOXAZOLE-TRIMETHOPRIM 800-160 MG PO TABS
2.0000 | ORAL_TABLET | Freq: Two times a day (BID) | ORAL | Status: AC
  Administered 2022-10-24 – 2022-10-28 (×10): 2 via ORAL
  Filled 2022-10-24 (×10): qty 2

## 2022-10-24 MED ORDER — LORAZEPAM 0.5 MG PO TABS
0.5000 mg | ORAL_TABLET | Freq: Four times a day (QID) | ORAL | Status: DC | PRN
  Administered 2022-10-24 – 2022-11-03 (×14): 0.5 mg via ORAL
  Filled 2022-10-24 (×15): qty 1

## 2022-10-24 MED ORDER — HALOPERIDOL 1 MG PO TABS: 2.0000 mg | ORAL_TABLET | Freq: Three times a day (TID) | ORAL | Status: DC | PRN

## 2022-10-24 NOTE — TOC Progression Note (Signed)
Transition of Care [facility]) - Progression Note    Patient Details  Name: Zoel Bassani MRN: [id number] Date of Birth: 1945-07-13  [facility]) CM/SW Contact  Carley Hammed, LCSW Phone Number: 10/24/2022, 12:35 PM  Clinical Narrative:    CSW met with pt and spouse at bedside. They confirmed they want to return to [facility] with HH there. [facility] contracts with [facility], Cathlean Sauer states they will set this up as long as the order is on the DC summary. They requested an updated FL2, this will be sent when cosigned. PT to see today to determine if ALF is appropriate. Cathlean Sauer states they will not have transportation. TOC will continue to follow for DC needs.         Expected Discharge Plan and Services                                               Social Determinants of Health (SDOH) Interventions SDOH Screenings   Food Insecurity: No Food Insecurity (09/04/2022)  Housing: Low Risk  (09/04/2022)  Transportation Needs: No Transportation Needs (09/04/2022)  Utilities: Not At Risk (09/04/2022)  Tobacco Use: High Risk (10/21/2022)    Readmission Risk Interventions    10/22/2022   12:12 PM 09/05/2022   11:23 AM  Readmission Risk Prevention Plan  Post Dischage Appt  Complete  Medication Screening  Complete  Transportation Screening Complete Complete  PCP or Specialist Appt within 5-7 Days Complete   Home Care Screening Complete   Medication Review (RN CM) Referral to Pharmacy

## 2022-10-24 NOTE — [place] FL2 (Signed)
[place] MEDICAID FL2 LEVEL OF CARE FORM     IDENTIFICATION  Patient Name: Tony [name] Birthdate: 1946/02/03 Sex: male Admission Date (Current Location): 10/21/2022  [facility] and [place] Number:  Producer, television/film/video and Address:  [facility], [address] [place], [place] [address]      Provider Number: [id number]  Attending Physician Name and Address:  Dorcas Carrow, MD  Relative Name and Phone Number:       Current Level of Care: Hospital Recommended Level of Care: Assisted Living Facility Prior Approval Number:    Date Approved/Denied:   PASRR Number: [id number]  Discharge Plan: Other (Comment) (ALF)    Current Diagnoses: Patient Active Problem List   Diagnosis Date Noted   Sepsis secondary to gram negative rod bacteremia due to UTI (HCC) 10/22/2022   Hyponatremia 10/22/2022   Normocytic anemia 10/22/2022   Multiple rib fractures 09/04/2022   Intractable pain 09/04/2022   History of falling 11/29/2021   Unsteadiness on feet 11/29/2021   Excessive sleepiness 11/08/2021   Cognitive impairment 12/25/2020   Anxiety 04/25/2020   Gait abnormality 03/30/2020   Parkinson's disease 03/30/2020   Memory loss 03/30/2020   Parkinsonism 03/30/2020   CELLULITIS AND ABSCESS OF FACE 02/01/2008   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/2006   HLD (hyperlipidemia) 06/30/2006   DIVERTICULOSIS, COLON 06/30/2006   SEBORRHEIC KERATOSIS 06/30/2006   BACK PAIN, LUMBAR 06/30/2006    Orientation RESPIRATION BLADDER Height & Weight     Self, Situation, Place  Normal Continent Weight: 194 lb 0.1 oz (88 kg) Height:  6' (182.9 cm)  BEHAVIORAL SYMPTOMS/MOOD NEUROLOGICAL BOWEL NUTRITION STATUS      Continent Diet (Regular)  AMBULATORY STATUS COMMUNICATION OF NEEDS Skin   Limited Assist Verbally Normal                       Personal Care Assistance Level of Assistance  Bathing, Feeding, Dressing Bathing Assistance: Limited  assistance Feeding assistance: Independent Dressing Assistance: Limited assistance     Functional Limitations Info  Sight, Hearing, Speech Sight Info: Adequate Hearing Info: Adequate Speech Info: Adequate    SPECIAL CARE FACTORS FREQUENCY  PT (By licensed PT), OT (By licensed OT)     PT Frequency: 2x week OT Frequency: 2x week            Contractures Contractures Info: Not present    Additional Factors Info  Code Status, Allergies Code Status Info: Full Allergies Info: Shellfish Allergy, Shellfish- derived products           Current Medications (10/24/2022):  This is the current hospital active medication list Current Facility-Administered Medications  Medication Dose Route Frequency Provider Last Rate Last Admin   acetaminophen (TYLENOL) tablet 650 mg  650 mg Oral Q6H PRN Tu, Ching T, DO   650 mg at 10/23/22 2040   atorvastatin (LIPITOR) tablet 20 mg  20 mg Oral QPM Tu, Ching T, DO   20 mg at 10/23/22 1659   carbidopa-levodopa (SINEMET IR) 25-100 MG per tablet immediate release 2 tablet  2 tablet Oral TID WC Tu, Ching T, DO   2 tablet at 10/24/22 1116   cefTRIAXone (ROCEPHIN) 2 g in sodium chloride 0.9 % 100 mL IVPB  2 g Intravenous Q24H Dorcas Carrow, MD 200 mL/hr at 10/23/22 1342 2 g at 10/23/22 1342   enoxaparin (LOVENOX) injection 40 mg  40 mg Subcutaneous Q24H Tu, Ching T, DO   40 mg at 10/24/22  1117   escitalopram (LEXAPRO) tablet 20 mg  20 mg Oral Daily Tu, Ching T, DO   20 mg at 10/24/22 1117   loratadine (CLARITIN) tablet 10 mg  10 mg Oral Daily Tu, Ching T, DO   10 mg at 10/24/22 1116   polyethylene glycol (MIRALAX / GLYCOLAX) packet 17 g  17 g Oral Daily Tu, Ching T, DO   17 g at 10/24/22 1117   tamsulosin (FLOMAX) capsule 0.4 mg  0.4 mg Oral Daily Dorcas Carrow, MD   0.4 mg at 10/24/22 1117   traZODone (DESYREL) tablet 50 mg  50 mg Oral QHS Tu, Ching T, DO   50 mg at 10/23/22 2040     Discharge Medications:   atorvastatin  20 mg Oral QPM    carbidopa-levodopa  2 tablet Oral TID WC   enoxaparin (LOVENOX) injection  40 mg Subcutaneous Q24H   escitalopram  20 mg Oral Daily   loratadine  10 mg Oral Daily   polyethylene glycol  17 g Oral Daily   tamsulosin  0.4 mg Oral Daily   traZODone  50 mg Oral QHS        Relevant Imaging Results:  Relevant Lab Results:   Additional Information SSN: [id number]  Carley Hammed, LCSW

## 2022-10-24 NOTE — Progress Notes (Signed)
PROGRESS NOTE    Tony [name]  WUJ:[id number] DOB: 03-27-1945 DOA: 10/21/2022 PCP: Chilton Greathouse, MD    Brief Narrative:  77 year old with Parkinson's, cognitive impairment, left hemineglect, dyspraxia, hyperlipidemia and anxiety presented with altered mental status and weakness.  Could not walk with a walker.  He lives in assisted living [facility].  Admitted with acute UTI , temperature 103, tachycardic, WBC count 18.  Now with E. coli bacteremia.   Assessment & Plan:   Sepsis present on admission secondary to E. coli UTI and bacteremia.  Presented with fever, tachycardia and leukocytosis. Sepsis physiology improving. Blood cultures growing E. coli no resistance.  Unfortunately urine cultures were not collected. On IV Rocephin, day 3 of IV antibiotics today.  Will continue IV antibiotics while patient is in the hospital and change to high-dose oral antibiotics to complete 7 days of therapy on discharge. Renal ultrasound did not show any hydronephrosis or urine retention.  Does have prostatomegaly.  He is on Flomax.  Check postvoid residual urine.  Chronic medical issues including Parkinson's, stable on Sinemet Hyperlipidemia, on statin  Reevaluated with PT OT as patient and family would like to go back to [place].  DVT prophylaxis: enoxaparin (LOVENOX) injection 40 mg Start: 10/22/22 1000   Code Status: Full code Family Communication: Wife at the bedside Disposition Plan: Status is: Inpatient Remains inpatient appropriate because: Bacteremia, IV antibiotics     Consultants:  None  Procedures:  None  Antimicrobials:  Rocephin 8/27---   Subjective:  Patient seen and examined.  Patient tells me he had some visual hallucination last night otherwise currently denies any complaints. Postvoid residuals were not checked and recorded. Remains afebrile.  Objective: Vitals:   10/23/22 1619 10/23/22 2213 10/24/22 0439 10/24/22 0909  BP: 129/73 132/77 135/82  110/69  Pulse: 84 82 67 77  Resp: 18 16 16 18   Temp: 98.5 F (36.9 C) 98.4 F (36.9 C) (!) 97.5 F (36.4 C) 98.7 F (37.1 C)  TempSrc:  Oral Oral Oral  SpO2: 98% 95% 100% 98%  Weight:      Height:        Intake/Output Summary (Last 24 hours) at 10/24/2022 1138 Last data filed at 10/24/2022 1043 Gross per 24 hour  Intake 840 ml  Output 3690 ml  Net -2850 ml   Filed Weights   10/21/22 1849  Weight: 88 kg    Examination:  General exam: Appears calm and comfortable , frail and debilitated. Respiratory system: No added sounds. Cardiovascular system: S1 & S2 heard, RRR.  Gastrointestinal system: Abdomen is nondistended, soft and nontender. No organomegaly or masses felt. Normal bowel sounds heard. Central nervous system: Alert and oriented. No focal neurological deficits.  Generalized weakness.    Data Reviewed: I have personally reviewed following labs and imaging studies  CBC: Recent Labs  Lab 10/21/22 1850 10/21/22 1906 10/22/22 1001 10/23/22 0503  WBC 18.3*  --  16.2* 9.3  HGB 10.1* 10.9* 8.8* 8.4*  HCT 32.0* 32.0* 27.5* 26.7*  MCV 66.5*  --  64.6* 65.1*  PLT 191  --  151 146*   Basic Metabolic Panel: Recent Labs  Lab 10/21/22 1906 10/21/22 2017 10/22/22 1001 10/23/22 0503  NA 135 133* 136 135  K 4.3 3.6 3.6 4.0  CL 101 101 106 103  CO2  --  23 23 23   GLUCOSE 123* 131* 108* 100*  BUN 19 15 13 13   CREATININE 0.90 0.96 0.86 0.80  CALCIUM  --  8.9 8.5* 8.2*  GFR: Estimated Creatinine Clearance: 82.2 mL/min (by C-G formula based on SCr of 0.8 mg/dL). Liver Function Tests: Recent Labs  Lab 10/21/22 2017 10/23/22 0503  AST 11* 15  ALT 8 16  ALKPHOS 74 59  BILITOT 0.6 0.4  PROT 7.2 5.7*  ALBUMIN 3.5 2.6*   No results for input(s): "LIPASE", "AMYLASE" in the last 168 hours. No results for input(s): "AMMONIA" in the last 168 hours. Coagulation Profile: Recent Labs  Lab 10/21/22 2017  INR 1.1   Cardiac Enzymes: No results for input(s):  "CKTOTAL", "CKMB", "CKMBINDEX", "TROPONINI" in the last 168 hours. BNP (last 3 results) No results for input(s): "PROBNP" in the last 8760 hours. HbA1C: No results for input(s): "HGBA1C" in the last 72 hours. CBG: Recent Labs  Lab 10/21/22 1904 10/23/22 0737  GLUCAP 132* 95   Lipid Profile: No results for input(s): "CHOL", "HDL", "LDLCALC", "TRIG", "CHOLHDL", "LDLDIRECT" in the last 72 hours. Thyroid Function Tests: No results for input(s): "TSH", "T4TOTAL", "FREET4", "T3FREE", "THYROIDAB" in the last 72 hours. Anemia Panel: No results for input(s): "VITAMINB12", "FOLATE", "FERRITIN", "TIBC", "IRON", "RETICCTPCT" in the last 72 hours. Sepsis Labs: Recent Labs  Lab 10/21/22 1906 10/22/22 0035  LATICACIDVEN 1.8 0.9    Recent Results (from the past 240 hour(s))  Resp panel by RT-PCR (RSV, Flu A&B, Covid) Anterior Nasal Swab     Status: None   Collection Time: 10/21/22  8:17 PM   Specimen: Anterior Nasal Swab  Result Value Ref Range Status   SARS Coronavirus 2 by RT PCR NEGATIVE NEGATIVE Final   Influenza A by PCR NEGATIVE NEGATIVE Final   Influenza B by PCR NEGATIVE NEGATIVE Final    Comment: (NOTE) The Xpert Xpress SARS-CoV-2/FLU/RSV plus assay is intended as an aid in the diagnosis of influenza from Nasopharyngeal swab specimens and should not be used as a sole basis for treatment. Nasal washings and aspirates are unacceptable for Xpert Xpress SARS-CoV-2/FLU/RSV testing.  Fact Sheet for Patients: BloggerCourse.com  Fact Sheet for Healthcare Providers: SeriousBroker.it  This test is not yet approved or cleared by the [place] FDA and has been authorized for detection and/or diagnosis of SARS-CoV-2 by FDA under an Emergency Use Authorization (EUA). This EUA will remain in effect (meaning this test can be used) for the duration of the COVID-19 declaration under Section 564(b)(1) of the Act, 21 U.S.C. section  360bbb-3(b)(1), unless the authorization is terminated or revoked.     Resp Syncytial Virus by PCR NEGATIVE NEGATIVE Final    Comment: (NOTE) Fact Sheet for Patients: BloggerCourse.com  Fact Sheet for Healthcare Providers: SeriousBroker.it  This test is not yet approved or cleared by the [place] FDA and has been authorized for detection and/or diagnosis of SARS-CoV-2 by FDA under an Emergency Use Authorization (EUA). This EUA will remain in effect (meaning this test can be used) for the duration of the COVID-19 declaration under Section 564(b)(1) of the Act, 21 U.S.C. section 360bbb-3(b)(1), unless the authorization is terminated or revoked.  Performed at [facility], [address] [place], [place] [address]   Blood Culture (routine x 2)     Status: Abnormal   Collection Time: 10/21/22  8:17 PM   Specimen: BLOOD RIGHT FOREARM  Result Value Ref Range Status   Specimen Description BLOOD RIGHT FOREARM  Final   Special Requests   Final    BOTTLES DRAWN AEROBIC AND ANAEROBIC Blood Culture results may not be optimal due to an excessive volume of blood received in culture bottles  Culture  Setup Time   Final    GRAM NEGATIVE RODS AEROBIC BOTTLE ONLY CRITICAL VALUE NOTED.  VALUE IS CONSISTENT WITH PREVIOUSLY REPORTED AND CALLED VALUE.    Culture (A)  Final    ESCHERICHIA COLI SUSCEPTIBILITIES PERFORMED ON PREVIOUS CULTURE WITHIN THE LAST 5 DAYS. Performed at [facility], [address], [place], [place] [address]    Report Status 10/24/2022 FINAL  Final  Blood Culture (routine x 2)     Status: Abnormal   Collection Time: 10/21/22  8:17 PM   Specimen: BLOOD LEFT HAND  Result Value Ref Range Status   Specimen Description BLOOD LEFT HAND  Final   Special Requests   Final    BOTTLES DRAWN AEROBIC ONLY Blood Culture adequate volume   Culture  Setup Time   Final    GRAM NEGATIVE RODS AEROBIC BOTTLE  ONLY CRITICAL RESULT CALLED TO, READ BACK BY AND VERIFIED WITH: PHARMD SHELBY WISE ON 10/22/22 @ 1714 BYH DRT Performed at [facility], [address] [place], [place] [address]    Culture ESCHERICHIA COLI (A)  Final   Report Status 10/24/2022 FINAL  Final   Organism ID, Bacteria ESCHERICHIA COLI  Final      Susceptibility   Escherichia coli - MIC*    AMPICILLIN >=32 RESISTANT Resistant     CEFEPIME <=0.12 SENSITIVE Sensitive     CEFTAZIDIME <=1 SENSITIVE Sensitive     CEFTRIAXONE <=0.25 SENSITIVE Sensitive     CIPROFLOXACIN <=0.25 SENSITIVE Sensitive     GENTAMICIN <=1 SENSITIVE Sensitive     IMIPENEM <=0.25 SENSITIVE Sensitive     TRIMETH/SULFA <=20 SENSITIVE Sensitive     AMPICILLIN/SULBACTAM 16 INTERMEDIATE Intermediate     PIP/TAZO <=4 SENSITIVE Sensitive     * ESCHERICHIA COLI  Blood Culture ID Panel (Reflexed)     Status: Abnormal   Collection Time: 10/21/22  8:17 PM  Result Value Ref Range Status   Enterococcus faecalis NOT DETECTED NOT DETECTED Final   Enterococcus Faecium NOT DETECTED NOT DETECTED Final   Listeria monocytogenes NOT DETECTED NOT DETECTED Final   Staphylococcus species NOT DETECTED NOT DETECTED Final   Staphylococcus aureus (BCID) NOT DETECTED NOT DETECTED Final   Staphylococcus epidermidis NOT DETECTED NOT DETECTED Final   Staphylococcus lugdunensis NOT DETECTED NOT DETECTED Final   Streptococcus species NOT DETECTED NOT DETECTED Final   Streptococcus agalactiae NOT DETECTED NOT DETECTED Final   Streptococcus pneumoniae NOT DETECTED NOT DETECTED Final   Streptococcus pyogenes NOT DETECTED NOT DETECTED Final   A.calcoaceticus-baumannii NOT DETECTED NOT DETECTED Final   Bacteroides fragilis NOT DETECTED NOT DETECTED Final   Enterobacterales DETECTED (A) NOT DETECTED Final    Comment: Enterobacterales represent a large order of gram negative bacteria, not a single organism. CRITICAL RESULT CALLED TO, READ BACK BY AND VERIFIED WITH: PHARMD SHELBY  WISE ON 10/22/22 @ 1714 BYH DRT    Enterobacter cloacae complex NOT DETECTED NOT DETECTED Final   Escherichia coli DETECTED (A) NOT DETECTED Final    Comment: CRITICAL RESULT CALLED TO, READ BACK BY AND VERIFIED WITH: PHARMD SHELBY WISE ON 10/22/22 @ 1714 BYH DRT    Klebsiella aerogenes NOT DETECTED NOT DETECTED Final   Klebsiella oxytoca NOT DETECTED NOT DETECTED Final   Klebsiella pneumoniae NOT DETECTED NOT DETECTED Final   Proteus species NOT DETECTED NOT DETECTED Final   Salmonella species NOT DETECTED NOT DETECTED Final   Serratia marcescens NOT DETECTED NOT DETECTED Final   Haemophilus influenzae NOT DETECTED NOT  DETECTED Final   Neisseria meningitidis NOT DETECTED NOT DETECTED Final   Pseudomonas aeruginosa NOT DETECTED NOT DETECTED Final   Stenotrophomonas maltophilia NOT DETECTED NOT DETECTED Final   Candida albicans NOT DETECTED NOT DETECTED Final   Candida auris NOT DETECTED NOT DETECTED Final   Candida glabrata NOT DETECTED NOT DETECTED Final   Candida krusei NOT DETECTED NOT DETECTED Final   Candida parapsilosis NOT DETECTED NOT DETECTED Final   Candida tropicalis NOT DETECTED NOT DETECTED Final   Cryptococcus neoformans/gattii NOT DETECTED NOT DETECTED Final   CTX-M ESBL NOT DETECTED NOT DETECTED Final   Carbapenem resistance IMP NOT DETECTED NOT DETECTED Final   Carbapenem resistance KPC NOT DETECTED NOT DETECTED Final   Carbapenem resistance NDM NOT DETECTED NOT DETECTED Final   Carbapenem resist OXA 48 LIKE NOT DETECTED NOT DETECTED Final   Carbapenem resistance VIM NOT DETECTED NOT DETECTED Final    Comment: Performed at [facility], [address], [place], [place] [address]         Radiology Studies: US RENAL  Result Date: 10/23/2022 CLINICAL DATA:  Retention of urine. EXAM: RENAL / URINARY TRACT ULTRASOUND COMPLETE COMPARISON:  CT of the abdomen and pelvis without contrast 08/05/2022 FINDINGS: Right Kidney: Renal measurements: 11.0 x 7.1 x 6 1 cm =  volume: 274 mL. Echogenicity within normal limits. No mass or hydronephrosis visualized. Left Kidney: Renal measurements: 10.4 x 6.9 x 6.8 cm = volume: 254 ML. Echogenicity within normal limits. No mass or hydronephrosis visualized. Minimal perinephric fluid is noted. Bladder: Appears normal for degree of bladder distention. Other: Prostate is enlarged, measuring 6.5 x 5 3 x 5.0 cm. IMPRESSION: 1. No evidence of hydronephrosis.  No significant nephrolithiasis. 2. Minimal perinephric fluid on the left is nonspecific. 3. Enlarged prostate. Electronically Signed   By: Marin Roberts M.D.   On: 10/23/2022 16:28        Scheduled Meds:  atorvastatin  20 mg Oral QPM   carbidopa-levodopa  2 tablet Oral TID WC   enoxaparin (LOVENOX) injection  40 mg Subcutaneous Q24H   escitalopram  20 mg Oral Daily   loratadine  10 mg Oral Daily   polyethylene glycol  17 g Oral Daily   tamsulosin  0.4 mg Oral Daily   traZODone  50 mg Oral QHS   Continuous Infusions:  cefTRIAXone (ROCEPHIN)  IV 2 g (10/23/22 1342)     LOS: 2 days    Time spent: 35 minutes    Dorcas Carrow, MD [facility]

## 2022-10-25 DIAGNOSIS — A419 Sepsis, unspecified organism: Secondary | ICD-10-CM | POA: Diagnosis not present

## 2022-10-25 DIAGNOSIS — N390 Urinary tract infection, site not specified: Secondary | ICD-10-CM | POA: Diagnosis not present

## 2022-10-25 MED ORDER — SODIUM CHLORIDE 0.9 % IV SOLN: INTRAVENOUS | Status: DC

## 2022-10-25 NOTE — Plan of Care (Signed)

## 2022-10-25 NOTE — Progress Notes (Signed)
Occupational Therapy Treatment Patient Details Name: Tony [name] MRN: [id number] DOB: [birth year] Today's Date: 10/25/2022   History of present illness 77 y.o. male who presented 10/21/22 with altered mental status and weakness. 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   OT comments  Pt limited by impaired cognition this session, much harder time following commands and sequencing movements. Performed rocking and practice of weight shifts prior to standing which made him Min A for STS but pt having a hard time with maintaining standing balance without assist. Pt Bp noted to drop during transitions, check orthostatic note for more details. At this time pt would benefit from SNF post acute due to the level of complexity and need for assist. OT to continue to progress pt as able.         If plan is discharge home, recommend the following:  A little help with walking and/or transfers;A lot of help with bathing/dressing/bathroom;Assistance with cooking/housework;Supervision due to cognitive status;Direct supervision/assist for financial management;Assist for transportation;Help with stairs or ramp for entrance;Direct supervision/assist for medications management   Equipment Recommendations  None recommended by OT (defer)    Recommendations for Other Services      Precautions / Restrictions Precautions Precautions: Fall Precaution Comments: hx dyspraxia, L hemineglect, freezing episodes Restrictions Weight Bearing Restrictions: No       Mobility Bed Mobility Overal bed mobility: Needs Assistance         Sit to supine: Mod assist, Used rails   General bed mobility comments: Pt presenting with posterior lean when sititng, cues to prop self on R shoulder to allow OT assist with LEs during bed mobility    Transfers Overall transfer level: Needs assistance Equipment  used: Rolling walker (2 wheels) Transfers: Sit to/from Stand, Bed to chair/wheelchair/BSC       Step pivot transfers: Mod assist     General transfer comment: STSx2 from recliner min A but pt having trouble maintaining standing balance, leaning posteriorly and losing footing despite cues from OT. During step pivot pt having hard time understanding commands to pivot and position bottom towards the bed     Balance Overall balance assessment: Needs assistance Sitting-balance support: Bilateral upper extremity supported, Feet supported Sitting balance-Leahy Scale: Poor Sitting balance - Comments: sitting EOB Postural control: Posterior lean Standing balance support: Bilateral upper extremity supported, Reliant on assistive device for balance, During functional activity Standing balance-Leahy Scale: Poor Standing balance comment: RW, posterior lean in prolonged standing                           ADL either performed or assessed with clinical judgement   ADL Overall ADL's : Needs assistance/impaired                                       General ADL Comments: Focused session on progressing with vestibular prep movements and functional mobility, assessed orthostatics as well.    Extremity/Trunk Assessment              Vision       Perception     Praxis      Cognition Arousal: Alert Behavior During Therapy: Flat affect Overall Cognitive Status: Impaired/Different from baseline Area of Impairment: Following commands, Safety/judgement, Awareness, Problem solving, Memory  Memory: Decreased short-term memory Following Commands: Follows one step commands inconsistently, Follows one step commands with increased time Safety/Judgement: Decreased awareness of safety, Decreased awareness of deficits Awareness: Intellectual Problem Solving: Slow processing, Decreased initiation, Difficulty sequencing, Requires verbal cues,  Requires tactile cues General Comments: Pt partially follows commands, during standing pt not able to follow commands for pivoting R (goes L instead). Decreased safety awarenss, stated he needed to sit and immediately made efforts to sit without knowing if the bed was behind him (although it was)        Exercises      Shoulder Instructions       General Comments Pt reports a headache but denies dizziness this session.  Bp assessed, pt is orthostatic (check seperate OT note for orthostatics).    Pertinent Vitals/ Pain       Pain Assessment Pain Assessment: Faces Faces Pain Scale: Hurts little more Pain Location: back Pain Descriptors / Indicators: Discomfort, Grimacing, Aching Pain Intervention(s): Limited activity within patient's tolerance, Monitored during session, Repositioned  Home Living                                          Prior Functioning/Environment              Frequency  Min 1X/week        Progress Toward Goals  OT Goals(current goals can now be found in the care plan section)  Progress towards OT goals: Progressing toward goals  Acute Rehab OT Goals Patient Stated Goal: To get better OT Goal Formulation: With patient Time For Goal Achievement: 11/06/22 Potential to Achieve Goals: Good  Plan      Co-evaluation                 AM-PAC OT "6 Clicks" Daily Activity     Outcome Measure   Help from another person eating meals?: A Little Help from another person taking care of personal grooming?: A Little Help from another person toileting, which includes using toliet, bedpan, or urinal?: A Lot Help from another person bathing (including washing, rinsing, drying)?: A Lot Help from another person to put on and taking off regular upper body clothing?: A Little Help from another person to put on and taking off regular lower body clothing?: A Lot 6 Click Score: 15    End of Session Equipment Utilized During Treatment: Gait  belt;Rolling walker (2 wheels)  OT Visit Diagnosis: Unsteadiness on feet (R26.81);Other abnormalities of gait and mobility (R26.89);History of falling (Z91.81);Muscle weakness (generalized) (M62.81)   Activity Tolerance Other (comment) (Limited by cognition)   Patient Left in bed;with call bell/phone within reach;with bed alarm set;with family/visitor present   Nurse Communication Mobility status        Time: 1610-9604 OT Time Calculation (min): 33 min  Charges: OT General Charges $OT Visit: 1 Visit OT Treatments $Therapeutic Activity: 23-37 mins  10/25/2022  AB, OTR/L  Acute Rehabilitation Services  Office: [phone]   Tristan Schroeder 10/25/2022, 2:16 PM

## 2022-10-25 NOTE — Progress Notes (Signed)
Physical Therapy Treatment Patient Details Name: Tony [name] MRN: [id number] DOB: [birth year] Today's Date: 10/25/2022   History of Present Illness 77 y.o. male who presented 10/21/22 with altered mental status and weakness. 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    PT Comments  Pt received in supine, pt eager to participate in therapy session due to c/o frustration with family wanting him to go to SNF instead of ALF. Pt needing increased assist, up to modA from hospital bed with [facility] elevated and use of rails for supine to EOB transfer and pt demonstrates initial zero progressing to poor seated balance, needing >3 mins of external trunk support prior to sitting unsupported/with single UE supported by rail. Pt needing +1 maxA for sit<>stand from elevated bed<>RW and rollator and pt with poor motor sequencing despite Errorless Learning strategy utilized and needs greatly increased time/cues to initiate and perform all tasks. Pt with episodes of freezing while performing pivotal transfers and gait trial, chair pulled up for pt to sit due to needing increased maxA during freezing episode and posterior LOB x3 episodes. RN/MD notified of pt increased assist levels needed and freezing episodes. Pt with skin breakdown on his upper back, RN notified. Pt continues to benefit from PT services to progress toward functional mobility goals, continue to recommend short term lower intensity post-acute rehab <3 hours/day upon DC, pt/family agreeable and case mgmt notified.    If plan is discharge home, recommend the following: Two people to help with walking and/or transfers;Assistance with feeding;Direct supervision/assist for medications management;Direct supervision/assist for financial management;Assist for transportation;Supervision due to cognitive status   Can travel by private vehicle     No   Equipment Recommendations  None recommended by PT;Other (comment) (TBD post-acute)    Recommendations for Other Services       Precautions / Restrictions Precautions Precautions: Fall (x2 recent falls) Precaution Comments: hx dyspraxia, L hemineglect, freezing episodes Restrictions Weight Bearing Restrictions: No     Mobility  Bed Mobility Overal bed mobility: Needs Assistance Bed Mobility: Supine to Sit     Supine to sit: Used rails, Mod assist     General bed mobility comments: increased time needed. Cues to use bed rails to assist with bed mobility. HOB elevated per home set-up. Pt had great difficulty with cues for anterior scooting and scooted backward instead of forward. Posterior lean upon sitting EO    Transfers Overall transfer level: Needs assistance Equipment used: Rolling walker (2 wheels) Transfers: Sit to/from Stand Sit to Stand: From elevated surface, Max assist, Mod assist           General transfer comment: From lower bed height pt standing initally with maxA with posterior bias, then took pivotal steps from EOB to [facility] with maxA due to posterior lean and difficulty lifting feet to step. From [facility] and chair height, pt standing with max cues and min to modA and still with posterior bias/LOB upon standing.    Ambulation/Gait Ambulation/Gait assistance: Mod assist, +2 safety/equipment Gait Distance (Feet): 6 Feet Assistive device: Rolling walker (2 wheels) (used 4WW for initial pivot to [facility], switched to RW for gait attempts due to poor rollator management) Gait Pattern/deviations: Step-to pattern, Decreased stride length, Trunk flexed, Narrow base of support, Shuffle       General Gait Details: in room ~76ft wtih dense cues, assist with RW mgmt and chair follow, pt had freezing episode and needing chair pulled up  and max cues to reach back for arm rest of chair and sit down. Pt again stood from chair for pre-gait marching exercise and again froze, unable to  follow commands for ~10 seconds to stand upright or reach back to sit down, RN/MD notified.   Stairs             Wheelchair Mobility     Tilt Bed    Modified Rankin (Stroke Patients Only)       Balance Overall balance assessment: Needs assistance Sitting-balance support: Bilateral upper extremity supported, Feet supported Sitting balance-Leahy Scale: Fair     Standing balance support: Bilateral upper extremity supported, Reliant on assistive device for balance Standing balance-Leahy Scale: Poor (Poor to Zero as he froze/fatigued) Standing balance comment: RW                            Cognition Arousal: Alert Behavior During Therapy: Flat affect Overall Cognitive Status: Impaired/Different from baseline Area of Impairment: Following commands, Safety/judgement, Awareness, Problem solving                       Following Commands: Follows one step commands inconsistently, Follows one step commands with increased time Safety/Judgement: Decreased awareness of safety, Decreased awareness of deficits Awareness: Intellectual Problem Solving: Slow processing, Decreased initiation, Difficulty sequencing, Requires verbal cues, Requires tactile cues General Comments: Pt noted to have difficulty with word finding, pt with freezing episodes while standing at RW and with significantly increased posterior lean during episode. Pt with difficulty sequencing body movements with motor commands this date and poor awareness of deficits/safety. Pt does admit he's not doing as well as he thought he would during functional tasks. Some LUE and RUE dysmetria this date.        Exercises Other Exercises Other Exercises: standing hip flexion x5 reps at RW prior to freezing/pt fatigue    General Comments General comments (skin integrity, edema, etc.): Pt with significant skin breakdown on his upper back/rash, RN notified, pt has blood on his sheets from this area. RN called  to room to witness the area on his back and groin where skin is bothering him. Pt did not c/o dizziness with standing or transfers but would benefit from orthostatic vitals assessment next session.      Pertinent Vitals/Pain Pain Assessment Pain Assessment: Faces Faces Pain Scale: Hurts a little bit Pain Location: back, groin (skin feels raw), generalized Pain Descriptors / Indicators: Discomfort, Grimacing Pain Intervention(s): Monitored during session, Limited activity within patient's tolerance, Repositioned     PT Goals (current goals can now be found in the care plan section) Acute Rehab PT Goals PT Goal Formulation: With family Time For Goal Achievement: 11/05/22 Progress towards PT goals: Progressing toward goals    Frequency    Min 1X/week      PT Plan         AM-PAC PT "6 Clicks" Mobility   Outcome Measure  Help needed turning from your back to your side while in a flat bed without using bedrails?: A Little Help needed moving from lying on your back to sitting on the side of a flat bed without using bedrails?: Total (flat bed/no rails) Help needed moving to and from a bed to a chair (including a wheelchair)?: A Lot Help needed standing up from a chair using your arms (e.g., wheelchair or bedside chair)?: A Lot Help needed to walk in hospital room?: Total Help needed  climbing 3-5 steps with a railing? : Total 6 Click Score: 10    End of Session Equipment Utilized During Treatment: Gait belt Activity Tolerance: Patient limited by fatigue;Treatment limited secondary to medical complications (Comment);Other (comment) (freezing episodes with standing/gait attempts) Patient left: in chair;with call bell/phone within reach;with chair alarm set;with family/visitor present;Other (comment) (spouse in the room with him) Nurse Communication: Mobility status;Other (comment) (skin breakdown on his back, pt freezing episodes needs +2 for safety with transfers) PT Visit  Diagnosis: Unsteadiness on feet (R26.81);Other abnormalities of gait and mobility (R26.89);History of falling (Z91.81);Muscle weakness (generalized) (M62.81)     Time: 1610-9604 PT Time Calculation (min) (ACUTE ONLY): 41 min  Charges:    $Gait Training: 8-22 mins $Therapeutic Activity: 23-37 mins PT General Charges $$ ACUTE PT VISIT: 1 Visit                     Jahmiya Guidotti P., PTA Acute Rehabilitation Services Secure Chat Preferred 9a-5:30pm Office: [phone]    Dorathy Kinsman [facility] 10/25/2022, 11:56 AM

## 2022-10-25 NOTE — Progress Notes (Signed)
Orthostatic BPs  Supine (after activity) 114/80  Sitting 106/72  Standing after 1 min 84/57    Pt orthostatic vitals assessed. Pt with poor static standing balance, only able to get Bp readings after standing for 1 min. Pt already sitting in recliner upon OT entry and tx session. When returned to bed pt BP noted to increased to what the family reports as more normal.  10/25/2022  AB, OTR/L  Acute Rehabilitation Services  Office: [phone]

## 2022-10-25 NOTE — Progress Notes (Signed)
PROGRESS NOTE    Tony [name]  TKZ:[id number] DOB: 1945/05/18 DOA: 10/21/2022 PCP: Chilton Greathouse, MD    Brief Narrative:  77 year old with Parkinson's, cognitive impairment, left hemineglect, dyspraxia, hyperlipidemia and anxiety presented with altered mental status and weakness.  Could not walk with a walker.  He lives in [facility] living facility [facility].  Admitted with acute UTI , temperature 103, tachycardic, WBC count 18.  Found to have E. coli bacteremia.   Assessment & Plan:   Sepsis present on admission secondary to E. coli UTI and bacteremia.  Presented with fever, tachycardia and leukocytosis. Sepsis physiology improving. Blood cultures growing E. coli no resistance.  Unfortunately urine cultures were not collected. Rocephin for 3 days, changing to Bactrim to complete 7 days of therapy.   Renal ultrasound did not show any hydronephrosis or urine retention.  Does have prostatomegaly.  He is on Flomax.  No significant postvoid residuals.  Chronic medical issues including Parkinson's, stable on Sinemet Hyperlipidemia, on statin  Debility deconditioning: Significant debility and deconditioning.  Difficulty mobilizing to be able to go to assisted living facility.  Will pursue SNF placement.  DVT prophylaxis: enoxaparin (LOVENOX) injection 40 mg Start: 10/22/22 1000   Code Status: Full code Family Communication: Wife at the bedside Disposition Plan: Status is: Inpatient Remains inpatient appropriate because: Unsafe discharge     Consultants:  None  Procedures:  None  Antimicrobials:  Rocephin 8/27--- 8/29 Bactrim 8/29--   Subjective:  Patient seen and examined.  Denies any complaints.  Poor historian.  Patient is quite upset about not being able to go to his assisted living facility.  Wife at the bedside.  Remains afebrile. Patient needed significant assist from 2 people to mobilize.  Objective: Vitals:   10/24/22 1622 10/24/22 2025 10/25/22 0455  10/25/22 0848  BP: (!) 140/89 (!) 143/84 136/79 109/68  Pulse: 87 90 70 67  Resp: 18 16 16 18   Temp: 98 F (36.7 C) 99 F (37.2 C) 98.1 F (36.7 C) 98.2 F (36.8 C)  TempSrc:  Oral Oral   SpO2: 99% 95% 93% 97%  Weight:      Height:        Intake/Output Summary (Last 24 hours) at 10/25/2022 1239 Last data filed at 10/25/2022 1100 Gross per 24 hour  Intake 660 ml  Output 1586 ml  Net -926 ml   Filed Weights   10/21/22 1849  Weight: 88 kg    Examination:  General exam: Anxious.  Comfortable.  Alert awake and oriented to himself and family.  With flat affect today. Respiratory system: No added sounds. Cardiovascular system: S1 & S2 heard, RRR.  Gastrointestinal system: Abdomen is nondistended, soft and nontender. No organomegaly or masses felt. Normal bowel sounds heard. Central nervous system: Alert and oriented. No focal neurological deficits.  Generalized weakness.    Data Reviewed: I have personally reviewed following labs and imaging studies  CBC: Recent Labs  Lab 10/21/22 1850 10/21/22 1906 10/22/22 1001 10/23/22 0503  WBC 18.3*  --  16.2* 9.3  HGB 10.1* 10.9* 8.8* 8.4*  HCT 32.0* 32.0* 27.5* 26.7*  MCV 66.5*  --  64.6* 65.1*  PLT 191  --  151 146*   Basic Metabolic Panel: Recent Labs  Lab 10/21/22 1906 10/21/22 2017 10/22/22 1001 10/23/22 0503  NA 135 133* 136 135  K 4.3 3.6 3.6 4.0  CL 101 101 106 103  CO2  --  23 23 23   GLUCOSE 123* 131* 108* 100*  BUN 19 15 13  13  CREATININE 0.90 0.96 0.86 0.80  CALCIUM  --  8.9 8.5* 8.2*   GFR: Estimated Creatinine Clearance: 82.2 mL/min (by C-G formula based on SCr of 0.8 mg/dL). Liver Function Tests: Recent Labs  Lab 10/21/22 2017 10/23/22 0503  AST 11* 15  ALT 8 16  ALKPHOS 74 59  BILITOT 0.6 0.4  PROT 7.2 5.7*  ALBUMIN 3.5 2.6*   No results for input(s): "LIPASE", "AMYLASE" in the last 168 hours. No results for input(s): "AMMONIA" in the last 168 hours. Coagulation Profile: Recent Labs   Lab 10/21/22 2017  INR 1.1   Cardiac Enzymes: No results for input(s): "CKTOTAL", "CKMB", "CKMBINDEX", "TROPONINI" in the last 168 hours. BNP (last 3 results) No results for input(s): "PROBNP" in the last 8760 hours. HbA1C: No results for input(s): "HGBA1C" in the last 72 hours. CBG: Recent Labs  Lab 10/21/22 1904 10/23/22 0737  GLUCAP 132* 95   Lipid Profile: No results for input(s): "CHOL", "HDL", "LDLCALC", "TRIG", "CHOLHDL", "LDLDIRECT" in the last 72 hours. Thyroid Function Tests: No results for input(s): "TSH", "T4TOTAL", "FREET4", "T3FREE", "THYROIDAB" in the last 72 hours. Anemia Panel: No results for input(s): "VITAMINB12", "FOLATE", "FERRITIN", "TIBC", "IRON", "RETICCTPCT" in the last 72 hours. Sepsis Labs: Recent Labs  Lab 10/21/22 1906 10/22/22 0035  LATICACIDVEN 1.8 0.9    Recent Results (from the past 240 hour(s))  Resp panel by RT-PCR (RSV, Flu A&B, Covid) Anterior Nasal Swab     Status: None   Collection Time: 10/21/22  8:17 PM   Specimen: Anterior Nasal Swab  Result Value Ref Range Status   SARS Coronavirus 2 by RT PCR NEGATIVE NEGATIVE Final   Influenza A by PCR NEGATIVE NEGATIVE Final   Influenza B by PCR NEGATIVE NEGATIVE Final    Comment: (NOTE) The Xpert Xpress SARS-CoV-2/FLU/RSV plus assay is intended as an aid in the diagnosis of influenza from Nasopharyngeal swab specimens and should not be used as a sole basis for treatment. Nasal washings and aspirates are unacceptable for Xpert Xpress SARS-CoV-2/FLU/RSV testing.  Fact Sheet for Patients: BloggerCourse.com  Fact Sheet for Healthcare Providers: SeriousBroker.it  This test is not yet approved or cleared by the [place] FDA and has been authorized for detection and/or diagnosis of SARS-CoV-2 by FDA under an Emergency Use Authorization (EUA). This EUA will remain in effect (meaning this test can be used) for the duration of  the COVID-19 declaration under Section 564(b)(1) of the Act, 21 U.S.C. section 360bbb-3(b)(1), unless the authorization is terminated or revoked.     Resp Syncytial Virus by PCR NEGATIVE NEGATIVE Final    Comment: (NOTE) Fact Sheet for Patients: BloggerCourse.com  Fact Sheet for Healthcare Providers: SeriousBroker.it  This test is not yet approved or cleared by the [place] FDA and has been authorized for detection and/or diagnosis of SARS-CoV-2 by FDA under an Emergency Use Authorization (EUA). This EUA will remain in effect (meaning this test can be used) for the duration of the COVID-19 declaration under Section 564(b)(1) of the Act, 21 U.S.C. section 360bbb-3(b)(1), unless the authorization is terminated or revoked.  Performed at [facility], [address] [place], [place] [address]   Blood Culture (routine x 2)     Status: Abnormal   Collection Time: 10/21/22  8:17 PM   Specimen: BLOOD RIGHT FOREARM  Result Value Ref Range Status   Specimen Description BLOOD RIGHT FOREARM  Final   Special Requests   Final    BOTTLES DRAWN AEROBIC AND ANAEROBIC Blood Culture  results may not be optimal due to an excessive volume of blood received in culture bottles   Culture  Setup Time   Final    GRAM NEGATIVE RODS AEROBIC BOTTLE ONLY CRITICAL VALUE NOTED.  VALUE IS CONSISTENT WITH PREVIOUSLY REPORTED AND CALLED VALUE.    Culture (A)  Final    ESCHERICHIA COLI SUSCEPTIBILITIES PERFORMED ON PREVIOUS CULTURE WITHIN THE LAST 5 DAYS. Performed at [facility], [address] [place], [place] [address]    Report Status 10/24/2022 FINAL  Final  Blood Culture (routine x 2)     Status: Abnormal   Collection Time: 10/21/22  8:17 PM   Specimen: BLOOD LEFT HAND  Result Value Ref Range Status   Specimen Description BLOOD LEFT HAND  Final   Special Requests   Final    BOTTLES DRAWN AEROBIC ONLY Blood Culture adequate volume    Culture  Setup Time   Final    GRAM NEGATIVE RODS AEROBIC BOTTLE ONLY CRITICAL RESULT CALLED TO, READ BACK BY AND VERIFIED WITH: PHARMD SHELBY WISE ON 10/22/22 @ 1714 BYH DRT Performed at [facility], [address] [place], [place] [address]    Culture ESCHERICHIA COLI (A)  Final   Report Status 10/24/2022 FINAL  Final   Organism ID, Bacteria ESCHERICHIA COLI  Final      Susceptibility   Escherichia coli - MIC*    AMPICILLIN >=32 RESISTANT Resistant     CEFEPIME <=0.12 SENSITIVE Sensitive     CEFTAZIDIME <=1 SENSITIVE Sensitive     CEFTRIAXONE <=0.25 SENSITIVE Sensitive     CIPROFLOXACIN <=0.25 SENSITIVE Sensitive     GENTAMICIN <=1 SENSITIVE Sensitive     IMIPENEM <=0.25 SENSITIVE Sensitive     TRIMETH/SULFA <=20 SENSITIVE Sensitive     AMPICILLIN/SULBACTAM 16 INTERMEDIATE Intermediate     PIP/TAZO <=4 SENSITIVE Sensitive     * ESCHERICHIA COLI  Blood Culture ID Panel (Reflexed)     Status: Abnormal   Collection Time: 10/21/22  8:17 PM  Result Value Ref Range Status   Enterococcus faecalis NOT DETECTED NOT DETECTED Final   Enterococcus Faecium NOT DETECTED NOT DETECTED Final   Listeria monocytogenes NOT DETECTED NOT DETECTED Final   Staphylococcus species NOT DETECTED NOT DETECTED Final   Staphylococcus aureus (BCID) NOT DETECTED NOT DETECTED Final   Staphylococcus epidermidis NOT DETECTED NOT DETECTED Final   Staphylococcus lugdunensis NOT DETECTED NOT DETECTED Final   Streptococcus species NOT DETECTED NOT DETECTED Final   Streptococcus agalactiae NOT DETECTED NOT DETECTED Final   Streptococcus pneumoniae NOT DETECTED NOT DETECTED Final   Streptococcus pyogenes NOT DETECTED NOT DETECTED Final   A.calcoaceticus-baumannii NOT DETECTED NOT DETECTED Final   Bacteroides fragilis NOT DETECTED NOT DETECTED Final   Enterobacterales DETECTED (A) NOT DETECTED Final    Comment: Enterobacterales represent a large order of gram negative bacteria, not a single  organism. CRITICAL RESULT CALLED TO, READ BACK BY AND VERIFIED WITH: PHARMD SHELBY WISE ON 10/22/22 @ 1714 BYH DRT    Enterobacter cloacae complex NOT DETECTED NOT DETECTED Final   Escherichia coli DETECTED (A) NOT DETECTED Final    Comment: CRITICAL RESULT CALLED TO, READ BACK BY AND VERIFIED WITH: PHARMD SHELBY WISE ON 10/22/22 @ 1714 BYH DRT    Klebsiella aerogenes NOT DETECTED NOT DETECTED Final   Klebsiella oxytoca NOT DETECTED NOT DETECTED Final   Klebsiella pneumoniae NOT DETECTED NOT DETECTED Final   Proteus species NOT DETECTED NOT DETECTED Final   Salmonella species NOT DETECTED NOT  DETECTED Final   Serratia marcescens NOT DETECTED NOT DETECTED Final   Haemophilus influenzae NOT DETECTED NOT DETECTED Final   Neisseria meningitidis NOT DETECTED NOT DETECTED Final   Pseudomonas aeruginosa NOT DETECTED NOT DETECTED Final   Stenotrophomonas maltophilia NOT DETECTED NOT DETECTED Final   Candida albicans NOT DETECTED NOT DETECTED Final   Candida auris NOT DETECTED NOT DETECTED Final   Candida glabrata NOT DETECTED NOT DETECTED Final   Candida krusei NOT DETECTED NOT DETECTED Final   Candida parapsilosis NOT DETECTED NOT DETECTED Final   Candida tropicalis NOT DETECTED NOT DETECTED Final   Cryptococcus neoformans/gattii NOT DETECTED NOT DETECTED Final   CTX-M ESBL NOT DETECTED NOT DETECTED Final   Carbapenem resistance IMP NOT DETECTED NOT DETECTED Final   Carbapenem resistance KPC NOT DETECTED NOT DETECTED Final   Carbapenem resistance NDM NOT DETECTED NOT DETECTED Final   Carbapenem resist OXA 48 LIKE NOT DETECTED NOT DETECTED Final   Carbapenem resistance VIM NOT DETECTED NOT DETECTED Final    Comment: Performed at [facility], [address], [place], [place] [address]         Radiology Studies: US RENAL  Result Date: 10/23/2022 CLINICAL DATA:  Retention of urine. EXAM: RENAL / URINARY TRACT ULTRASOUND COMPLETE COMPARISON:  CT of the abdomen and pelvis without  contrast 08/05/2022 FINDINGS: Right Kidney: Renal measurements: 11.0 x 7.1 x 6 1 cm = volume: 274 mL. Echogenicity within normal limits. No mass or hydronephrosis visualized. Left Kidney: Renal measurements: 10.4 x 6.9 x 6.8 cm = volume: 254 ML. Echogenicity within normal limits. No mass or hydronephrosis visualized. Minimal perinephric fluid is noted. Bladder: Appears normal for degree of bladder distention. Other: Prostate is enlarged, measuring 6.5 x 5 3 x 5.0 cm. IMPRESSION: 1. No evidence of hydronephrosis.  No significant nephrolithiasis. 2. Minimal perinephric fluid on the left is nonspecific. 3. Enlarged prostate. Electronically Signed   By: Marin Roberts M.D.   On: 10/23/2022 16:28        Scheduled Meds:  atorvastatin  20 mg Oral QPM   carbidopa-levodopa  2 tablet Oral TID WC   enoxaparin (LOVENOX) injection  40 mg Subcutaneous Q24H   escitalopram  20 mg Oral Daily   loratadine  10 mg Oral Daily   polyethylene glycol  17 g Oral Daily   sulfamethoxazole-trimethoprim  2 tablet Oral Q12H   tamsulosin  0.4 mg Oral Daily   traZODone  50 mg Oral QHS   Continuous Infusions:     LOS: 3 days    Time spent: 35 minutes    Dorcas Carrow, MD [facility]

## 2022-10-25 NOTE — TOC Progression Note (Addendum)
Transition of Care [facility]) - Progression Note    Patient Details  Name: Tony [name] MRN: [id number] Date of Birth: 1945/07/14  Transition of Care [facility] CM/SW Contact  Carley Hammed, LCSW Phone Number: 10/25/2022, 1:10 PM  Clinical Narrative:    CSW met with pt and family at bedside and they have confirmed they would like to pursue [facility] SNF for rehab. CSW spoke with [facility] and they will have a bed Tuesday as it is a holiday weekend. [facility]'s [facility] says [facility] as [facility], but pt has an active HTA policy. CSW left VM for HTA to start authorization. TOC will continue to follow for DC needs.   1:30 pt Orthostatic, will hold off on initiating auth at this time. Pt's HTA info is on his card under the media tab.  ID# [id number]      Expected Discharge Plan and Services                                               Social Determinants of Health (SDOH) Interventions SDOH Screenings   Food Insecurity: No Food Insecurity (09/04/2022)  Housing: Low Risk  (09/04/2022)  Transportation Needs: No Transportation Needs (09/04/2022)  Utilities: Not At Risk (09/04/2022)  Tobacco Use: High Risk (10/21/2022)    Readmission Risk Interventions    10/22/2022   12:12 PM 09/05/2022   11:23 AM  Readmission Risk Prevention Plan  Post Dischage Appt  Complete  Medication Screening  Complete  Transportation Screening Complete Complete  PCP or Specialist Appt within 5-7 Days Complete   Home Care Screening Complete   Medication Review (RN CM) Referral to Pharmacy

## 2022-10-26 DIAGNOSIS — A419 Sepsis, unspecified organism: Secondary | ICD-10-CM | POA: Diagnosis not present

## 2022-10-26 DIAGNOSIS — N390 Urinary tract infection, site not specified: Secondary | ICD-10-CM | POA: Diagnosis not present

## 2022-10-26 LAB — GLUCOSE, CAPILLARY
Glucose-Capillary: 91 mg/dL (ref 70–99)
Glucose-Capillary: 102 mg/dL — ABNORMAL HIGH (ref 70–99)

## 2022-10-26 NOTE — Progress Notes (Signed)
PROGRESS NOTE    Tony [name]  ZOX:[id number] DOB: 17-Feb-1946 DOA: 10/21/2022 PCP: Chilton Greathouse, MD    Brief Narrative:  77 year old with Parkinson's, cognitive impairment, left hemineglect, dyspraxia, hyperlipidemia and anxiety presented with altered mental status and weakness.  Could not walk with a walker.  He lives in assisted living [facility].  Admitted with acute UTI , temperature 103, tachycardic, WBC count 18.  Found to have E. coli bacteremia.   Assessment & Plan:   Sepsis present on admission secondary to E. coli UTI and bacteremia.  Presented with fever, tachycardia and leukocytosis. Sepsis physiology improving. Blood cultures with E. coli no resistance.  Unfortunately urine cultures were not collected. Rocephin for 3 days, changing to Bactrim to complete 7 days of therapy.   Renal ultrasound did not show any hydronephrosis or urine retention.  Does have prostatomegaly.  He is on Flomax.  No significant postvoid residuals.  Chronic medical issues including Parkinson's, stable on Sinemet Hyperlipidemia, on statin  Debility deconditioning: Significant debility and deconditioning.  Recommending SNF.  Medically stable.  DVT prophylaxis: enoxaparin (LOVENOX) injection 40 mg Start: 10/22/22 1000   Code Status: Full code Family Communication: Daughter at the bedside. Disposition Plan: Status is: Inpatient Remains inpatient appropriate because: Medically stable.  Waiting for SNF.     Consultants:  None  Procedures:  None  Antimicrobials:  Rocephin 8/27--- 8/29 Bactrim 8/29--   Subjective: Patient was seen and examined.  Himself denies any complaints.  Daughter at the bedside.  Had significant orthostatic dizziness however no drop in blood pressure.  Will discontinue IV fluids.  Encourage oral intake.  Objective: Vitals:   10/25/22 0848 10/25/22 1625 10/25/22 2100 10/26/22 0946  BP: 109/68 (!) 141/83 126/74 119/69  Pulse: 67 (!) 101 90 78  Resp:  18 18 16 18   Temp: 98.2 F (36.8 C) 98.6 F (37 C) 98.9 F (37.2 C) 97.8 F (36.6 C)  TempSrc:   Oral Oral  SpO2: 97% 94% 96% 97%  Weight:      Height:        Intake/Output Summary (Last 24 hours) at 10/26/2022 1137 Last data filed at 10/26/2022 0600 Gross per 24 hour  Intake 1518.58 ml  Output 400 ml  Net 1118.58 ml   Filed Weights   10/21/22 1849  Weight: 88 kg    Examination:  General exam: Anxious.  Flat affect.  Comfortable.  Not in any distress. Respiratory system: No added sounds. Cardiovascular system: S1 & S2 heard, RRR.  Gastrointestinal system: Abdomen is nondistended, soft and nontender. No organomegaly or masses felt. Normal bowel sounds heard. Central nervous system: Alert and oriented. No focal neurological deficits.  Generalized weakness.    Data Reviewed: I have personally reviewed following labs and imaging studies  CBC: Recent Labs  Lab 10/21/22 1850 10/21/22 1906 10/22/22 1001 10/23/22 0503  WBC 18.3*  --  16.2* 9.3  HGB 10.1* 10.9* 8.8* 8.4*  HCT 32.0* 32.0* 27.5* 26.7*  MCV 66.5*  --  64.6* 65.1*  PLT 191  --  151 146*   Basic Metabolic Panel: Recent Labs  Lab 10/21/22 1906 10/21/22 2017 10/22/22 1001 10/23/22 0503  NA 135 133* 136 135  K 4.3 3.6 3.6 4.0  CL 101 101 106 103  CO2  --  23 23 23   GLUCOSE 123* 131* 108* 100*  BUN 19 15 13 13   CREATININE 0.90 0.96 0.86 0.80  CALCIUM  --  8.9 8.5* 8.2*   GFR: Estimated Creatinine Clearance: 82.2 mL/min (  by C-G formula based on SCr of 0.8 mg/dL). Liver Function Tests: Recent Labs  Lab 10/21/22 2017 10/23/22 0503  AST 11* 15  ALT 8 16  ALKPHOS 74 59  BILITOT 0.6 0.4  PROT 7.2 5.7*  ALBUMIN 3.5 2.6*   No results for input(s): "LIPASE", "AMYLASE" in the last 168 hours. No results for input(s): "AMMONIA" in the last 168 hours. Coagulation Profile: Recent Labs  Lab 10/21/22 2017  INR 1.1   Cardiac Enzymes: No results for input(s): "CKTOTAL", "CKMB", "CKMBINDEX",  "TROPONINI" in the last 168 hours. BNP (last 3 results) No results for input(s): "PROBNP" in the last 8760 hours. HbA1C: No results for input(s): "HGBA1C" in the last 72 hours. CBG: Recent Labs  Lab 10/21/22 1904 10/23/22 0737 10/26/22 0728  GLUCAP 132* 95 91   Lipid Profile: No results for input(s): "CHOL", "HDL", "LDLCALC", "TRIG", "CHOLHDL", "LDLDIRECT" in the last 72 hours. Thyroid Function Tests: No results for input(s): "TSH", "T4TOTAL", "FREET4", "T3FREE", "THYROIDAB" in the last 72 hours. Anemia Panel: No results for input(s): "VITAMINB12", "FOLATE", "FERRITIN", "TIBC", "IRON", "RETICCTPCT" in the last 72 hours. Sepsis Labs: Recent Labs  Lab 10/21/22 1906 10/22/22 0035  LATICACIDVEN 1.8 0.9    Recent Results (from the past 240 hour(s))  Resp panel by RT-PCR (RSV, Flu A&B, Covid) Anterior Nasal Swab     Status: None   Collection Time: 10/21/22  8:17 PM   Specimen: Anterior Nasal Swab  Result Value Ref Range Status   SARS Coronavirus 2 by RT PCR NEGATIVE NEGATIVE Final   Influenza A by PCR NEGATIVE NEGATIVE Final   Influenza B by PCR NEGATIVE NEGATIVE Final    Comment: (NOTE) The Xpert Xpress SARS-CoV-2/FLU/RSV plus assay is intended as an aid in the diagnosis of influenza from Nasopharyngeal swab specimens and should not be used as a sole basis for treatment. Nasal washings and aspirates are unacceptable for Xpert Xpress SARS-CoV-2/FLU/RSV testing.  Fact Sheet for Patients: BloggerCourse.com  Fact Sheet for Healthcare Providers: SeriousBroker.it  This test is not yet approved or cleared by the [place] FDA and has been authorized for detection and/or diagnosis of SARS-CoV-2 by FDA under an Emergency Use Authorization (EUA). This EUA will remain in effect (meaning this test can be used) for the duration of the COVID-19 declaration under Section 564(b)(1) of the Act, 21 U.S.C. section 360bbb-3(b)(1),  unless the authorization is terminated or revoked.     Resp Syncytial Virus by PCR NEGATIVE NEGATIVE Final    Comment: (NOTE) Fact Sheet for Patients: BloggerCourse.com  Fact Sheet for Healthcare Providers: SeriousBroker.it  This test is not yet approved or cleared by the [place] FDA and has been authorized for detection and/or diagnosis of SARS-CoV-2 by FDA under an Emergency Use Authorization (EUA). This EUA will remain in effect (meaning this test can be used) for the duration of the COVID-19 declaration under Section 564(b)(1) of the Act, 21 U.S.C. section 360bbb-3(b)(1), unless the authorization is terminated or revoked.  Performed at [facility], [address] [place], [place] [address]   Blood Culture (routine x 2)     Status: Abnormal   Collection Time: 10/21/22  8:17 PM   Specimen: BLOOD RIGHT FOREARM  Result Value Ref Range Status   Specimen Description BLOOD RIGHT FOREARM  Final   Special Requests   Final    BOTTLES DRAWN AEROBIC AND ANAEROBIC Blood Culture results may not be optimal due to an excessive volume of blood received in culture bottles   Culture  Setup Time   Final    GRAM NEGATIVE RODS AEROBIC BOTTLE ONLY CRITICAL VALUE NOTED.  VALUE IS CONSISTENT WITH PREVIOUSLY REPORTED AND CALLED VALUE.    Culture (A)  Final    ESCHERICHIA COLI SUSCEPTIBILITIES PERFORMED ON PREVIOUS CULTURE WITHIN THE LAST 5 DAYS. Performed at [facility], [address], [place], [place] [address]    Report Status 10/24/2022 FINAL  Final  Blood Culture (routine x 2)     Status: Abnormal   Collection Time: 10/21/22  8:17 PM   Specimen: BLOOD LEFT HAND  Result Value Ref Range Status   Specimen Description BLOOD LEFT HAND  Final   Special Requests   Final    BOTTLES DRAWN AEROBIC ONLY Blood Culture adequate volume   Culture  Setup Time   Final    GRAM NEGATIVE RODS AEROBIC BOTTLE ONLY CRITICAL RESULT  CALLED TO, READ BACK BY AND VERIFIED WITH: PHARMD SHELBY WISE ON 10/22/22 @ 1714 [facility] DRT Performed at [facility], [address], [place], [place] [address]    Culture ESCHERICHIA COLI (A)  Final   Report Status 10/24/2022 FINAL  Final   Organism ID, Bacteria ESCHERICHIA COLI  Final      Susceptibility   Escherichia coli - MIC*    AMPICILLIN >=32 RESISTANT Resistant     CEFEPIME <=0.12 SENSITIVE Sensitive     CEFTAZIDIME <=1 SENSITIVE Sensitive     CEFTRIAXONE <=0.25 SENSITIVE Sensitive     CIPROFLOXACIN <=0.25 SENSITIVE Sensitive     GENTAMICIN <=1 SENSITIVE Sensitive     IMIPENEM <=0.25 SENSITIVE Sensitive     TRIMETH/SULFA <=20 SENSITIVE Sensitive     AMPICILLIN/SULBACTAM 16 INTERMEDIATE Intermediate     PIP/TAZO <=4 SENSITIVE Sensitive     * ESCHERICHIA COLI  Blood Culture ID Panel (Reflexed)     Status: Abnormal   Collection Time: 10/21/22  8:17 PM  Result Value Ref Range Status   Enterococcus faecalis NOT DETECTED NOT DETECTED Final   Enterococcus Faecium NOT DETECTED NOT DETECTED Final   Listeria monocytogenes NOT DETECTED NOT DETECTED Final   Staphylococcus species NOT DETECTED NOT DETECTED Final   Staphylococcus aureus (BCID) NOT DETECTED NOT DETECTED Final   Staphylococcus epidermidis NOT DETECTED NOT DETECTED Final   Staphylococcus lugdunensis NOT DETECTED NOT DETECTED Final   Streptococcus species NOT DETECTED NOT DETECTED Final   Streptococcus agalactiae NOT DETECTED NOT DETECTED Final   Streptococcus pneumoniae NOT DETECTED NOT DETECTED Final   Streptococcus pyogenes NOT DETECTED NOT DETECTED Final   A.calcoaceticus-baumannii NOT DETECTED NOT DETECTED Final   Bacteroides fragilis NOT DETECTED NOT DETECTED Final   Enterobacterales DETECTED (A) NOT DETECTED Final    Comment: Enterobacterales represent a large order of gram negative bacteria, not a single organism. CRITICAL RESULT CALLED TO, READ BACK BY AND VERIFIED WITH: PHARMD SHELBY WISE ON 10/22/22 @ 1714  [facility] DRT    Enterobacter cloacae complex NOT DETECTED NOT DETECTED Final   Escherichia coli DETECTED (A) NOT DETECTED Final    Comment: CRITICAL RESULT CALLED TO, READ BACK BY AND VERIFIED WITH: PHARMD SHELBY WISE ON 10/22/22 @ 1714 [facility] DRT    Klebsiella aerogenes NOT DETECTED NOT DETECTED Final   Klebsiella oxytoca NOT DETECTED NOT DETECTED Final   Klebsiella pneumoniae NOT DETECTED NOT DETECTED Final   Proteus species NOT DETECTED NOT DETECTED Final   Salmonella species NOT DETECTED NOT DETECTED Final   Serratia marcescens NOT DETECTED NOT DETECTED Final   Haemophilus influenzae NOT DETECTED NOT DETECTED Final  Neisseria meningitidis NOT DETECTED NOT DETECTED Final   Pseudomonas aeruginosa NOT DETECTED NOT DETECTED Final   Stenotrophomonas maltophilia NOT DETECTED NOT DETECTED Final   Candida albicans NOT DETECTED NOT DETECTED Final   Candida auris NOT DETECTED NOT DETECTED Final   Candida glabrata NOT DETECTED NOT DETECTED Final   Candida krusei NOT DETECTED NOT DETECTED Final   Candida parapsilosis NOT DETECTED NOT DETECTED Final   Candida tropicalis NOT DETECTED NOT DETECTED Final   Cryptococcus neoformans/gattii NOT DETECTED NOT DETECTED Final   CTX-M ESBL NOT DETECTED NOT DETECTED Final   Carbapenem resistance IMP NOT DETECTED NOT DETECTED Final   Carbapenem resistance KPC NOT DETECTED NOT DETECTED Final   Carbapenem resistance NDM NOT DETECTED NOT DETECTED Final   Carbapenem resist OXA 48 LIKE NOT DETECTED NOT DETECTED Final   Carbapenem resistance VIM NOT DETECTED NOT DETECTED Final    Comment: Performed at [facility], [address] [place], [place] [address]         Radiology Studies: No results found.      Scheduled Meds:  atorvastatin  20 mg Oral QPM   carbidopa-levodopa  2 tablet Oral TID WC   enoxaparin (LOVENOX) injection  40 mg Subcutaneous Q24H   escitalopram  20 mg Oral Daily   loratadine  10 mg Oral Daily   polyethylene glycol  17 g Oral  Daily   sulfamethoxazole-trimethoprim  2 tablet Oral Q12H   tamsulosin  0.4 mg Oral Daily   traZODone  50 mg Oral QHS   Continuous Infusions:     LOS: 4 days    Time spent: 35 minutes    Dorcas Carrow, MD [facility]

## 2022-10-26 NOTE — Progress Notes (Signed)
Physical Therapy Treatment Patient Details Name: Tony [name] MRN: [id number] DOB: Mar 29, 1945 Today's Date: 10/26/2022   History of Present Illness 77 y.o. male who presented 10/21/22 with altered mental status and weakness. 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    PT Comments  Patient resting in bed, daughter at bedside and pt agreeable and motivated to participate in therapy.  Min assist for bed mobility and once seated EOB pt with slight posterior lean, exercises completed to facilitate anterior trunk lean and pt able to maintain more upright posture following exercises. Pt completed 2x sit<>stand from EOB with RW and Mod+2 assist, mod assist to guide walker and steps to move bed>chair. Pt ambulated ~6' forward in room with RW and chair follow for safety. Pt with poor Lt LE advancement and limited attention to bring Lt foot forward. Rt step length improved with visual cues to step up towards therapists foot. Pt agreeable to remain OOB in recliner and EOS participated in Lt UE forward reaching and object manipulation to challenge Le UE strength, coordination and facilitate anterior trunk weight shift. Will continue to progress pt as able, recommendations remain appropriate.    If plan is discharge home, recommend the following: Two people to help with walking and/or transfers;Assistance with feeding;Direct supervision/assist for medications management;Direct supervision/assist for financial management;Assist for transportation;Supervision due to cognitive status   Can travel by private vehicle     No  Equipment Recommendations  None recommended by PT;Other (comment)    Recommendations for Other Services       Precautions / Restrictions Precautions Precautions: Fall Precaution Comments: hx dyspraxia, L hemineglect, freezing episodes Restrictions Weight Bearing  Restrictions: No     Mobility  Bed Mobility Overal bed mobility: Needs Assistance Bed Mobility: Supine to Sit     Supine to sit: HOB elevated, Used rails, +2 for safety/equipment, Min assist     General bed mobility comments: pt able to follow cues to initiate moving LE's off EOB. cues and min asssit to guide use of bed rail to pivot hips and raise trunk upright. Min assist on sitting EOB to stabilize balance due to posterior lean and cues to hod knees improved posture. pt compelted 10x ant reach to toes to facilitate anterior lean and improved uprigh posture following exercise.    Transfers Overall transfer level: Needs assistance Equipment used: Rolling walker (2 wheels) Transfers: Sit to/from Stand, Bed to chair/wheelchair/BSC Sit to Stand: From elevated surface, +2 safety/equipment, Mod assist, Max assist   Step pivot transfers: Mod assist, +2 safety/equipment       General transfer comment: Mod+2 with Rw for sit<>stand from elevated EOB. manual assist for anterior weight shift. pt requesting sit after ~2 mintues standing due to "feel like I'm going out". pt remained alert and continued conversation with therapist with seated rest and on following stand. 2+ with RW to guide weigth shift for stepping and guide RW with turn to move bed>chair.    Ambulation/Gait Ambulation/Gait assistance: Mod assist, +2 safety/equipment Gait Distance (Feet): 6 Feet Assistive device: Rolling walker (2 wheels) Gait Pattern/deviations: Step-to pattern, Decreased stride length, Trunk flexed, Narrow base of support, Shuffle, Decreased step length - left Gait velocity: decr     General Gait Details: Chair follow for safety. Mod assist with RW to ambulate short bout in room. pt able to step Rt LE forward to match therapists foot/visual cue but Lt LE with very limited/short advacnement.  Stairs             Wheelchair Mobility     Tilt Bed    Modified Rankin (Stroke Patients Only)        Balance Overall balance assessment: Needs assistance Sitting-balance support: Bilateral upper extremity supported, Feet supported Sitting balance-Leahy Scale: Poor Sitting balance - Comments: reliant on cues for ant lean and hand placement on knees Postural control: Posterior lean Standing balance support: Bilateral upper extremity supported, Reliant on assistive device for balance, During functional activity Standing balance-Leahy Scale: Poor                              Cognition Arousal: Alert Behavior During Therapy: Flat affect (smiling at conversation about family and jokes) Overall Cognitive Status: Impaired/Different from baseline Area of Impairment: Following commands, Safety/judgement, Awareness, Problem solving, Memory                     Memory: Decreased short-term memory Following Commands: Follows one step commands inconsistently, Follows one step commands with increased time Safety/Judgement: Decreased awareness of safety, Decreased awareness of deficits Awareness: Intellectual Problem Solving: Slow processing, Decreased initiation, Difficulty sequencing, Requires verbal cues, Requires tactile cues General Comments: Pt partially follows commands, during standing pt not able to follow commands for pivoting R (goes L instead). Decreased safety awarenss, stated he needed to sit and immediately made efforts to sit without knowing if the bed was behind him (although it was)        Exercises Other Exercises Other Exercises: seated anterior lean, 10x, cues to reach bil hands to touch toes/socks, AAROM provided on Lt UE. Other Exercises: Lt UE reaching to pick up 2 objects at floor level and place on bed height with Lt hand. pt removed from bed transferred to Rt hand and placed objects back to floor. pt completed 2x through (2 objects)    General Comments        Pertinent Vitals/Pain Pain Assessment Pain Assessment: Faces Faces Pain Scale:  Hurts a little bit Pain Location: generalized with mobility, back Pain Descriptors / Indicators: Discomfort, Grimacing, Aching Pain Intervention(s): Limited activity within patient's tolerance, Monitored during session, Repositioned    Home Living                          Prior Function            PT Goals (current goals can now be found in the care plan section) Acute Rehab PT Goals PT Goal Formulation: With family Time For Goal Achievement: 11/05/22 Potential to Achieve Goals: Fair Progress towards PT goals: Progressing toward goals    Frequency    Min 1X/week      PT Plan      Co-evaluation              AM-PAC PT "6 Clicks" Mobility   Outcome Measure  Help needed turning from your back to your side while in a flat bed without using bedrails?: A Little Help needed moving from lying on your back to sitting on the side of a flat bed without using bedrails?: A Lot Help needed moving to and from a bed to a chair (including a wheelchair)?: A Lot Help needed standing up from a chair using your arms (e.g., wheelchair or bedside chair)?: A Lot Help needed to walk in hospital room?: Total Help needed climbing 3-5 steps with a railing? :  Total 6 Click Score: 11    End of Session Equipment Utilized During Treatment: Gait belt Activity Tolerance: Patient tolerated treatment well Patient left: in chair;with call bell/phone within reach;with family/visitor present Nurse Communication: Mobility status;Other (comment) (+2 for safety with transfers) PT Visit Diagnosis: Unsteadiness on feet (R26.81);Other abnormalities of gait and mobility (R26.89);History of falling (Z91.81);Muscle weakness (generalized) (M62.81)     Time: 7846-9629 PT Time Calculation (min) (ACUTE ONLY): 38 min  Charges:    $Gait Training: 8-22 mins $Therapeutic Activity: 23-37 mins PT General Charges $$ ACUTE PT VISIT: 1 Visit                     Wynn Maudlin, DPT Acute Rehabilitation  Services Office [phone]  10/26/22 12:15 PM

## 2022-10-26 NOTE — Progress Notes (Signed)
   10/26/22 1743  Vitals  Temp 97.9 F (36.6 C)  Temp Source Oral  BP 118/72  MAP (mmHg) 85  BP Location Left Arm  BP Method Automatic  Patient Position (if appropriate) Lying  Pulse Rate 89  Pulse Rate Source Monitor  Resp 18  MEWS COLOR  MEWS Score Color Green  Orthostatic Lying   BP- Lying 118/72  Pulse- Lying 89  Orthostatic Sitting  BP- Sitting 109/68  Pulse- Sitting 98  Orthostatic Standing at 0 minutes  BP- Standing at 0 minutes  (couldnt get BP to register)  Pulse- Standing at 0 minutes 117  Orthostatic Standing at 3 minutes  BP- Standing at 3 minutes 94/70  Pulse- Standing at 3 minutes 115  Oxygen Therapy  SpO2 93 %  O2 Device Room Air  MEWS Score  MEWS Temp 0  MEWS Systolic 0  MEWS Pulse 0  MEWS RR 0  MEWS LOC 0  MEWS Score 0

## 2022-10-26 NOTE — Plan of Care (Signed)

## 2022-10-27 DIAGNOSIS — A419 Sepsis, unspecified organism: Secondary | ICD-10-CM | POA: Diagnosis not present

## 2022-10-27 DIAGNOSIS — N390 Urinary tract infection, site not specified: Secondary | ICD-10-CM | POA: Diagnosis not present

## 2022-10-27 LAB — GLUCOSE, CAPILLARY
Glucose-Capillary: 84 mg/dL (ref 70–99)
Glucose-Capillary: 102 mg/dL — ABNORMAL HIGH (ref 70–99)
Glucose-Capillary: 123 mg/dL — ABNORMAL HIGH (ref 70–99)
Glucose-Capillary: 115 mg/dL — ABNORMAL HIGH (ref 70–99)

## 2022-10-27 MED ORDER — POLYETHYLENE GLYCOL 3350 17 G PO PACK
17.0000 g | PACK | Freq: Two times a day (BID) | ORAL | Status: DC
  Administered 2022-10-27 – 2022-11-04 (×14): 17 g via ORAL
  Filled 2022-10-27 (×13): qty 1

## 2022-10-27 MED ORDER — BISACODYL 10 MG RE SUPP
10.0000 mg | Freq: Once | RECTAL | Status: AC
  Administered 2022-10-27: 10 mg via RECTAL
  Filled 2022-10-27: qty 1

## 2022-10-27 MED ORDER — TRAZODONE HCL 50 MG PO TABS
100.0000 mg | ORAL_TABLET | Freq: Every day | ORAL | Status: DC
  Administered 2022-10-27 – 2022-11-03 (×8): 100 mg via ORAL
  Filled 2022-10-27 (×8): qty 2

## 2022-10-27 NOTE — Progress Notes (Signed)
PROGRESS NOTE    Tony [name]  ZOX:[id number] DOB: 07-25-1945 DOA: 10/21/2022 PCP: Chilton Greathouse, MD    Brief Narrative:  77 year old with Parkinson's, cognitive impairment, left hemineglect, dyspraxia, hyperlipidemia and anxiety presented with altered mental status and weakness.  Could not walk with a walker.  [facility] lives in [facility].  Admitted with acute UTI , temperature 103, tachycardic, WBC count 18.  Found to have E. coli bacteremia.   Assessment & Plan:   Sepsis present on admission secondary to E. coli UTI and bacteremia.  Presented with fever, tachycardia and leukocytosis. Sepsis physiology improving. Blood cultures with E. coli no resistance.  Unfortunately urine cultures were not collected. Rocephin for 3 days, changing to Bactrim to complete 7 days of therapy.   Renal ultrasound did not show any hydronephrosis or urine retention.  Does have prostatomegaly.  He is on Flomax.  No significant postvoid residuals.  Chronic medical issues including Parkinson's, stable on Sinemet Hyperlipidemia, on statin  Debility deconditioning: Significant debility and deconditioning.  Recommending SNF.  Medically stable.  DVT prophylaxis: enoxaparin (LOVENOX) injection 40 mg Start: 10/22/22 1000   Code Status: Full code Family Communication: Wife at the bedside. Disposition Plan: Status is: Inpatient Remains inpatient appropriate because: Medically stable.  Waiting for SNF.     Consultants:  None  Procedures:  None  Antimicrobials:  Rocephin 8/27--- 8/29 Bactrim 8/29--   Subjective:  Patient seen and examined.  No overnight events.  No bowel movement since 8/27.  Unhappy but cannot tell me what is wrong.  Did not get enough sleep last night. Will increase trazodone to 100 mg at night.  Objective: Vitals:   10/26/22 1751 10/26/22 1755 10/26/22 2053 10/27/22 0850  BP: 94/70  130/85 107/63  Pulse: (!) 115 88 84 75  Resp:   17 17  Temp:   97.8  F (36.6 C) 98 F (36.7 C)  TempSrc:   Oral Oral  SpO2:   95% 96%  Weight:      Height:        Intake/Output Summary (Last 24 hours) at 10/27/2022 1041 Last data filed at 10/27/2022 0850 Gross per 24 hour  Intake 600 ml  Output 1750 ml  Net -1150 ml   Filed Weights   10/21/22 1849  Weight: 88 kg    Examination:  General exam: Anxious with flat affect.  On room air.  Looks comfortable. Respiratory system: No added sounds. Cardiovascular system: S1 & S2 heard, RRR.  Gastrointestinal system: Abdomen is nondistended, soft and nontender. No organomegaly or masses felt. Normal bowel sounds heard. Central nervous system: Alert and oriented. No focal neurological deficits.  Generalized weakness.    Data Reviewed: I have personally reviewed following labs and imaging studies  CBC: Recent Labs  Lab 10/21/22 1850 10/21/22 1906 10/22/22 1001 10/23/22 0503  WBC 18.3*  --  16.2* 9.3  HGB 10.1* 10.9* 8.8* 8.4*  HCT 32.0* 32.0* 27.5* 26.7*  MCV 66.5*  --  64.6* 65.1*  PLT 191  --  151 146*   Basic Metabolic Panel: Recent Labs  Lab 10/21/22 1906 10/21/22 2017 10/22/22 1001 10/23/22 0503  NA 135 133* 136 135  K 4.3 3.6 3.6 4.0  CL 101 101 106 103  CO2  --  23 23 23   GLUCOSE 123* 131* 108* 100*  BUN 19 15 13 13   CREATININE 0.90 0.96 0.86 0.80  CALCIUM  --  8.9 8.5* 8.2*   GFR: Estimated Creatinine Clearance: 82.2 mL/min (by C-G  formula based on SCr of 0.8 mg/dL). Liver Function Tests: Recent Labs  Lab 10/21/22 2017 10/23/22 0503  AST 11* 15  ALT 8 16  ALKPHOS 74 59  BILITOT 0.6 0.4  PROT 7.2 5.7*  ALBUMIN 3.5 2.6*   No results for input(s): "LIPASE", "AMYLASE" in the last 168 hours. No results for input(s): "AMMONIA" in the last 168 hours. Coagulation Profile: Recent Labs  Lab 10/21/22 2017  INR 1.1   Cardiac Enzymes: No results for input(s): "CKTOTAL", "CKMB", "CKMBINDEX", "TROPONINI" in the last 168 hours. BNP (last 3 results) No results for input(s):  "PROBNP" in the last 8760 hours. HbA1C: No results for input(s): "HGBA1C" in the last 72 hours. CBG: Recent Labs  Lab 10/21/22 1904 10/23/22 0737 10/26/22 0728 10/26/22 1151 10/27/22 0715  GLUCAP 132* 95 91 102* 84   Lipid Profile: No results for input(s): "CHOL", "HDL", "LDLCALC", "TRIG", "CHOLHDL", "LDLDIRECT" in the last 72 hours. Thyroid Function Tests: No results for input(s): "TSH", "T4TOTAL", "FREET4", "T3FREE", "THYROIDAB" in the last 72 hours. Anemia Panel: No results for input(s): "VITAMINB12", "FOLATE", "FERRITIN", "TIBC", "IRON", "RETICCTPCT" in the last 72 hours. Sepsis Labs: Recent Labs  Lab 10/21/22 1906 10/22/22 0035  LATICACIDVEN 1.8 0.9    Recent Results (from the past 240 hour(s))  Resp panel by RT-PCR (RSV, Flu A&B, Covid) Anterior Nasal Swab     Status: None   Collection Time: 10/21/22  8:17 PM   Specimen: Anterior Nasal Swab  Result Value Ref Range Status   SARS Coronavirus 2 by RT PCR NEGATIVE NEGATIVE Final   Influenza A by PCR NEGATIVE NEGATIVE Final   Influenza B by PCR NEGATIVE NEGATIVE Final    Comment: (NOTE) The Xpert Xpress SARS-CoV-2/FLU/RSV plus assay is intended as an aid in the diagnosis of influenza from Nasopharyngeal swab specimens and should not be used as a sole basis for treatment. Nasal washings and aspirates are unacceptable for Xpert Xpress SARS-CoV-2/FLU/RSV testing.  Fact Sheet for Patients: BloggerCourse.com  Fact Sheet for Healthcare Providers: SeriousBroker.it  This test is not yet approved or cleared by the [place] FDA and has been authorized for detection and/or diagnosis of SARS-CoV-2 by FDA under an Emergency Use Authorization (EUA). This EUA will remain in effect (meaning this test can be used) for the duration of the COVID-19 declaration under Section 564(b)(1) of the Act, 21 U.S.C. section 360bbb-3(b)(1), unless the authorization is terminated  or revoked.     Resp Syncytial Virus by PCR NEGATIVE NEGATIVE Final    Comment: (NOTE) Fact Sheet for Patients: BloggerCourse.com  Fact Sheet for Healthcare Providers: SeriousBroker.it  This test is not yet approved or cleared by the [place] FDA and has been authorized for detection and/or diagnosis of SARS-CoV-2 by FDA under an Emergency Use Authorization (EUA). This EUA will remain in effect (meaning this test can be used) for the duration of the COVID-19 declaration under Section 564(b)(1) of the Act, 21 U.S.C. section 360bbb-3(b)(1), unless the authorization is terminated or revoked.  Performed at [facility], [address] [place], [place] [address]   Blood Culture (routine x 2)     Status: Abnormal   Collection Time: 10/21/22  8:17 PM   Specimen: BLOOD RIGHT FOREARM  Result Value Ref Range Status   Specimen Description BLOOD RIGHT FOREARM  Final   Special Requests   Final    BOTTLES DRAWN AEROBIC AND ANAEROBIC Blood Culture results may not be optimal due to an excessive volume of blood received in culture bottles  Culture  Setup Time   Final    GRAM NEGATIVE RODS AEROBIC BOTTLE ONLY CRITICAL VALUE NOTED.  VALUE IS CONSISTENT WITH PREVIOUSLY REPORTED AND CALLED VALUE.    Culture (A)  Final    ESCHERICHIA COLI SUSCEPTIBILITIES PERFORMED ON PREVIOUS CULTURE WITHIN THE LAST 5 DAYS. Performed at [facility], [address], [place], [place] [address]    Report Status 10/24/2022 FINAL  Final  Blood Culture (routine x 2)     Status: Abnormal   Collection Time: 10/21/22  8:17 PM   Specimen: BLOOD LEFT HAND  Result Value Ref Range Status   Specimen Description BLOOD LEFT HAND  Final   Special Requests   Final    BOTTLES DRAWN AEROBIC ONLY Blood Culture adequate volume   Culture  Setup Time   Final    GRAM NEGATIVE RODS AEROBIC BOTTLE ONLY CRITICAL RESULT CALLED TO, READ BACK BY AND VERIFIED WITH:  PHARMD SHELBY WISE ON 10/22/22 @ 1714 BYH DRT Performed at [facility], [address] [place], [place] [address]    Culture ESCHERICHIA COLI (A)  Final   Report Status 10/24/2022 FINAL  Final   Organism ID, Bacteria ESCHERICHIA COLI  Final      Susceptibility   Escherichia coli - MIC*    AMPICILLIN >=32 RESISTANT Resistant     CEFEPIME <=0.12 SENSITIVE Sensitive     CEFTAZIDIME <=1 SENSITIVE Sensitive     CEFTRIAXONE <=0.25 SENSITIVE Sensitive     CIPROFLOXACIN <=0.25 SENSITIVE Sensitive     GENTAMICIN <=1 SENSITIVE Sensitive     IMIPENEM <=0.25 SENSITIVE Sensitive     TRIMETH/SULFA <=20 SENSITIVE Sensitive     AMPICILLIN/SULBACTAM 16 INTERMEDIATE Intermediate     PIP/TAZO <=4 SENSITIVE Sensitive     * ESCHERICHIA COLI  Blood Culture ID Panel (Reflexed)     Status: Abnormal   Collection Time: 10/21/22  8:17 PM  Result Value Ref Range Status   Enterococcus faecalis NOT DETECTED NOT DETECTED Final   Enterococcus Faecium NOT DETECTED NOT DETECTED Final   Listeria monocytogenes NOT DETECTED NOT DETECTED Final   Staphylococcus species NOT DETECTED NOT DETECTED Final   Staphylococcus aureus (BCID) NOT DETECTED NOT DETECTED Final   Staphylococcus epidermidis NOT DETECTED NOT DETECTED Final   Staphylococcus lugdunensis NOT DETECTED NOT DETECTED Final   Streptococcus species NOT DETECTED NOT DETECTED Final   Streptococcus agalactiae NOT DETECTED NOT DETECTED Final   Streptococcus pneumoniae NOT DETECTED NOT DETECTED Final   Streptococcus pyogenes NOT DETECTED NOT DETECTED Final   A.calcoaceticus-baumannii NOT DETECTED NOT DETECTED Final   Bacteroides fragilis NOT DETECTED NOT DETECTED Final   Enterobacterales DETECTED (A) NOT DETECTED Final    Comment: Enterobacterales represent a large order of gram negative bacteria, not a single organism. CRITICAL RESULT CALLED TO, READ BACK BY AND VERIFIED WITH: PHARMD SHELBY WISE ON 10/22/22 @ 1714 BYH DRT    Enterobacter cloacae complex  NOT DETECTED NOT DETECTED Final   Escherichia coli DETECTED (A) NOT DETECTED Final    Comment: CRITICAL RESULT CALLED TO, READ BACK BY AND VERIFIED WITH: PHARMD SHELBY WISE ON 10/22/22 @ 1714 BYH DRT    Klebsiella aerogenes NOT DETECTED NOT DETECTED Final   Klebsiella oxytoca NOT DETECTED NOT DETECTED Final   Klebsiella pneumoniae NOT DETECTED NOT DETECTED Final   Proteus species NOT DETECTED NOT DETECTED Final   Salmonella species NOT DETECTED NOT DETECTED Final   Serratia marcescens NOT DETECTED NOT DETECTED Final   Haemophilus influenzae NOT DETECTED NOT  DETECTED Final   Neisseria meningitidis NOT DETECTED NOT DETECTED Final   Pseudomonas aeruginosa NOT DETECTED NOT DETECTED Final   Stenotrophomonas maltophilia NOT DETECTED NOT DETECTED Final   Candida albicans NOT DETECTED NOT DETECTED Final   Candida auris NOT DETECTED NOT DETECTED Final   Candida glabrata NOT DETECTED NOT DETECTED Final   Candida krusei NOT DETECTED NOT DETECTED Final   Candida parapsilosis NOT DETECTED NOT DETECTED Final   Candida tropicalis NOT DETECTED NOT DETECTED Final   Cryptococcus neoformans/gattii NOT DETECTED NOT DETECTED Final   CTX-M ESBL NOT DETECTED NOT DETECTED Final   Carbapenem resistance IMP NOT DETECTED NOT DETECTED Final   Carbapenem resistance KPC NOT DETECTED NOT DETECTED Final   Carbapenem resistance NDM NOT DETECTED NOT DETECTED Final   Carbapenem resist OXA 48 LIKE NOT DETECTED NOT DETECTED Final   Carbapenem resistance VIM NOT DETECTED NOT DETECTED Final    Comment: Performed at [facility], [address], [place], [place] [address]         Radiology Studies: No results found.      Scheduled Meds:  atorvastatin  20 mg Oral QPM   bisacodyl  10 mg Rectal Once   carbidopa-levodopa  2 tablet Oral TID WC   enoxaparin (LOVENOX) injection  40 mg Subcutaneous Q24H   escitalopram  20 mg Oral Daily   loratadine  10 mg Oral Daily   polyethylene glycol  17 g Oral BID    sulfamethoxazole-trimethoprim  2 tablet Oral Q12H   tamsulosin  0.4 mg Oral Daily   traZODone  100 mg Oral QHS   Continuous Infusions:     LOS: 5 days    Time spent: 35 minutes    Dorcas Carrow, MD [facility]

## 2022-10-28 DIAGNOSIS — A419 Sepsis, unspecified organism: Secondary | ICD-10-CM | POA: Diagnosis not present

## 2022-10-28 DIAGNOSIS — N390 Urinary tract infection, site not specified: Secondary | ICD-10-CM | POA: Diagnosis not present

## 2022-10-28 LAB — GLUCOSE, CAPILLARY: Glucose-Capillary: 95 mg/dL (ref 70–99)

## 2022-10-28 MED ORDER — MIDODRINE HCL 5 MG PO TABS
5.0000 mg | ORAL_TABLET | Freq: Three times a day (TID) | ORAL | Status: DC
  Administered 2022-10-28 – 2022-10-29 (×4): 5 mg via ORAL
  Filled 2022-10-28 (×4): qty 1

## 2022-10-28 MED ORDER — ORAL CARE MOUTH RINSE: 15.0000 mL | OROMUCOSAL | Status: DC | PRN

## 2022-10-28 NOTE — Progress Notes (Signed)
PROGRESS NOTE    Tony [name]  ZOX:[id number] DOB: Oct 03, 1945 DOA: 10/21/2022 PCP: Chilton Greathouse, MD    Brief Narrative:  77 year old with Parkinson's, cognitive impairment, left hemineglect, dyspraxia, hyperlipidemia and anxiety presented with altered mental status and weakness.  Could not walk with a walker.  He lives in [facility] living facility [facility].  Admitted with acute UTI , temperature 103, tachycardic, WBC count 18.  Found to have E. coli bacteremia.   Assessment & Plan:   Sepsis present on admission secondary to E. coli UTI and bacteremia.  Presented with fever, tachycardia and leukocytosis. Sepsis physiology improving. Blood cultures with E. coli no resistance.  Unfortunately urine cultures were not collected. Rocephin for 3 days, changing to Bactrim to complete 7 days of therapy.   Renal ultrasound did not show any hydronephrosis or urine retention.  Does have prostatomegaly.  He is on Flomax.  No significant postvoid residuals.  Orthostatic hypotension: Significant orthostatic hypotension with drop in systolic blood pressure more than 20.  Not relieved with IV fluids. Start midodrine 5 mg 3 times daily.  Compression socks.  Frequent mobility and upright posture.  Orthostatic precautions. Flomax may cause orthostatic drop in blood pressure, however he has significant prostatism.  Will continue.  Chronic medical issues including Parkinson's, stable on Sinemet Hyperlipidemia, on statin  Debility deconditioning: Significant debility and deconditioning.  Recommending SNF.  Medically stable.  DVT prophylaxis: enoxaparin (LOVENOX) injection 40 mg Start: 10/22/22 1000   Code Status: Full code Family Communication: Wife and daughter at the bedside. Disposition Plan: Status is: Inpatient Remains inpatient appropriate because: Medically stable.  Waiting for SNF.     Consultants:  None  Procedures:  None  Antimicrobials:  Rocephin 8/27--- 8/29 Bactrim  8/29--   Subjective:  Patient seen and examined.  Patient himself denies any complaints.  Noted to be orthostatic yesterday evening while attempting to get out of the bed to the chair. Objective: Vitals:   10/27/22 1631 10/27/22 2018 10/28/22 0616 10/28/22 0857  BP: 111/75 113/74 105/75 105/69  Pulse: 93 95 74 73  Resp:  19 20   Temp: 98.4 F (36.9 C) 98.4 F (36.9 C) 98.1 F (36.7 C) (!) 97.4 F (36.3 C)  TempSrc: Oral   Oral  SpO2: 95% (!) 83% 98% 96%  Weight:      Height:        Intake/Output Summary (Last 24 hours) at 10/28/2022 1157 Last data filed at 10/28/2022 0641 Gross per 24 hour  Intake 720 ml  Output 827 ml  Net -107 ml   Filed Weights   10/21/22 1849  Weight: 88 kg    Examination:  General exam: Flat affect.  Interactive.  On room air.  Looks comfortable. Respiratory system: No added sounds. Cardiovascular system: S1 & S2 heard, RRR.  Gastrointestinal system: Abdomen is nondistended, soft and nontender. No organomegaly or masses felt. Normal bowel sounds heard. Central nervous system: Alert and oriented. No focal neurological deficits.  Generalized weakness.    Data Reviewed: I have personally reviewed following labs and imaging studies  CBC: Recent Labs  Lab 10/21/22 1850 10/21/22 1906 10/22/22 1001 10/23/22 0503  WBC 18.3*  --  16.2* 9.3  HGB 10.1* 10.9* 8.8* 8.4*  HCT 32.0* 32.0* 27.5* 26.7*  MCV 66.5*  --  64.6* 65.1*  PLT 191  --  151 146*   Basic Metabolic Panel: Recent Labs  Lab 10/21/22 1906 10/21/22 2017 10/22/22 1001 10/23/22 0503  NA 135 133* 136 135  K 4.3  3.6 3.6 4.0  CL 101 101 106 103  CO2  --  23 23 23   GLUCOSE 123* 131* 108* 100*  BUN 19 15 13 13   CREATININE 0.90 0.96 0.86 0.80  CALCIUM  --  8.9 8.5* 8.2*   GFR: Estimated Creatinine Clearance: 82.2 mL/min (by C-G formula based on SCr of 0.8 mg/dL). Liver Function Tests: Recent Labs  Lab 10/21/22 2017 10/23/22 0503  AST 11* 15  ALT 8 16  ALKPHOS 74 59   BILITOT 0.6 0.4  PROT 7.2 5.7*  ALBUMIN 3.5 2.6*   No results for input(s): "LIPASE", "AMYLASE" in the last 168 hours. No results for input(s): "AMMONIA" in the last 168 hours. Coagulation Profile: Recent Labs  Lab 10/21/22 2017  INR 1.1   Cardiac Enzymes: No results for input(s): "CKTOTAL", "CKMB", "CKMBINDEX", "TROPONINI" in the last 168 hours. BNP (last 3 results) No results for input(s): "PROBNP" in the last 8760 hours. HbA1C: No results for input(s): "HGBA1C" in the last 72 hours. CBG: Recent Labs  Lab 10/27/22 0715 10/27/22 1138 10/27/22 1631 10/27/22 2019 10/28/22 0729  GLUCAP 84 102* 123* 115* 95   Lipid Profile: No results for input(s): "CHOL", "HDL", "LDLCALC", "TRIG", "CHOLHDL", "LDLDIRECT" in the last 72 hours. Thyroid Function Tests: No results for input(s): "TSH", "T4TOTAL", "FREET4", "T3FREE", "THYROIDAB" in the last 72 hours. Anemia Panel: No results for input(s): "VITAMINB12", "FOLATE", "FERRITIN", "TIBC", "IRON", "RETICCTPCT" in the last 72 hours. Sepsis Labs: Recent Labs  Lab 10/21/22 1906 10/22/22 0035  LATICACIDVEN 1.8 0.9    Recent Results (from the past 240 hour(s))  Resp panel by RT-PCR (RSV, Flu A&B, Covid) Anterior Nasal Swab     Status: None   Collection Time: 10/21/22  8:17 PM   Specimen: Anterior Nasal Swab  Result Value Ref Range Status   SARS Coronavirus 2 by RT PCR NEGATIVE NEGATIVE Final   Influenza A by PCR NEGATIVE NEGATIVE Final   Influenza B by PCR NEGATIVE NEGATIVE Final    Comment: (NOTE) The Xpert Xpress SARS-CoV-2/FLU/RSV plus assay is intended as an aid in the diagnosis of influenza from Nasopharyngeal swab specimens and should not be used as a sole basis for treatment. Nasal washings and aspirates are unacceptable for Xpert Xpress SARS-CoV-2/FLU/RSV testing.  Fact Sheet for Patients: BloggerCourse.com  Fact Sheet for Healthcare Providers: SeriousBroker.it  This  test is not yet approved or cleared by the [place] FDA and has been authorized for detection and/or diagnosis of SARS-CoV-2 by FDA under an Emergency Use Authorization (EUA). This EUA will remain in effect (meaning this test can be used) for the duration of the COVID-19 declaration under Section 564(b)(1) of the Act, 21 U.S.C. section 360bbb-3(b)(1), unless the authorization is terminated or revoked.     Resp Syncytial Virus by PCR NEGATIVE NEGATIVE Final    Comment: (NOTE) Fact Sheet for Patients: BloggerCourse.com  Fact Sheet for Healthcare Providers: SeriousBroker.it  This test is not yet approved or cleared by the [place] FDA and has been authorized for detection and/or diagnosis of SARS-CoV-2 by FDA under an Emergency Use Authorization (EUA). This EUA will remain in effect (meaning this test can be used) for the duration of the COVID-19 declaration under Section 564(b)(1) of the Act, 21 U.S.C. section 360bbb-3(b)(1), unless the authorization is terminated or revoked.  Performed at [facility], [address] [place], [place] [address]   Blood Culture (routine x 2)     Status: Abnormal   Collection Time: 10/21/22  8:17 PM  Specimen: BLOOD RIGHT FOREARM  Result Value Ref Range Status   Specimen Description BLOOD RIGHT FOREARM  Final   Special Requests   Final    BOTTLES DRAWN AEROBIC AND ANAEROBIC Blood Culture results may not be optimal due to an excessive volume of blood received in culture bottles   Culture  Setup Time   Final    GRAM NEGATIVE RODS AEROBIC BOTTLE ONLY CRITICAL VALUE NOTED.  VALUE IS CONSISTENT WITH PREVIOUSLY REPORTED AND CALLED VALUE.    Culture (A)  Final    ESCHERICHIA COLI SUSCEPTIBILITIES PERFORMED ON PREVIOUS CULTURE WITHIN THE LAST 5 DAYS. Performed at [facility], [address] [place], [place] [address]    Report Status 10/24/2022 FINAL  Final  Blood Culture  (routine x 2)     Status: Abnormal   Collection Time: 10/21/22  8:17 PM   Specimen: BLOOD LEFT HAND  Result Value Ref Range Status   Specimen Description BLOOD LEFT HAND  Final   Special Requests   Final    BOTTLES DRAWN AEROBIC ONLY Blood Culture adequate volume   Culture  Setup Time   Final    GRAM NEGATIVE RODS AEROBIC BOTTLE ONLY CRITICAL RESULT CALLED TO, READ BACK BY AND VERIFIED WITH: PHARMD SHELBY WISE ON 10/22/22 @ 1714 BYH DRT Performed at [facility], [address] [place], [place] [address]    Culture ESCHERICHIA COLI (A)  Final   Report Status 10/24/2022 FINAL  Final   Organism ID, Bacteria ESCHERICHIA COLI  Final      Susceptibility   Escherichia coli - MIC*    AMPICILLIN >=32 RESISTANT Resistant     CEFEPIME <=0.12 SENSITIVE Sensitive     CEFTAZIDIME <=1 SENSITIVE Sensitive     CEFTRIAXONE <=0.25 SENSITIVE Sensitive     CIPROFLOXACIN <=0.25 SENSITIVE Sensitive     GENTAMICIN <=1 SENSITIVE Sensitive     IMIPENEM <=0.25 SENSITIVE Sensitive     TRIMETH/SULFA <=20 SENSITIVE Sensitive     AMPICILLIN/SULBACTAM 16 INTERMEDIATE Intermediate     PIP/TAZO <=4 SENSITIVE Sensitive     * ESCHERICHIA COLI  Blood Culture ID Panel (Reflexed)     Status: Abnormal   Collection Time: 10/21/22  8:17 PM  Result Value Ref Range Status   Enterococcus faecalis NOT DETECTED NOT DETECTED Final   Enterococcus Faecium NOT DETECTED NOT DETECTED Final   Listeria monocytogenes NOT DETECTED NOT DETECTED Final   Staphylococcus species NOT DETECTED NOT DETECTED Final   Staphylococcus aureus (BCID) NOT DETECTED NOT DETECTED Final   Staphylococcus epidermidis NOT DETECTED NOT DETECTED Final   Staphylococcus lugdunensis NOT DETECTED NOT DETECTED Final   Streptococcus species NOT DETECTED NOT DETECTED Final   Streptococcus agalactiae NOT DETECTED NOT DETECTED Final   Streptococcus pneumoniae NOT DETECTED NOT DETECTED Final   Streptococcus pyogenes NOT DETECTED NOT DETECTED Final    A.calcoaceticus-baumannii NOT DETECTED NOT DETECTED Final   Bacteroides fragilis NOT DETECTED NOT DETECTED Final   Enterobacterales DETECTED (A) NOT DETECTED Final    Comment: Enterobacterales represent a large order of gram negative bacteria, not a single organism. CRITICAL RESULT CALLED TO, READ BACK BY AND VERIFIED WITH: PHARMD SHELBY WISE ON 10/22/22 @ 1714 BYH DRT    Enterobacter cloacae complex NOT DETECTED NOT DETECTED Final   Escherichia coli DETECTED (A) NOT DETECTED Final    Comment: CRITICAL RESULT CALLED TO, READ BACK BY AND VERIFIED WITH: PHARMD SHELBY WISE ON 10/22/22 @ 1714 BYH DRT    Klebsiella aerogenes NOT DETECTED NOT  DETECTED Final   Klebsiella oxytoca NOT DETECTED NOT DETECTED Final   Klebsiella pneumoniae NOT DETECTED NOT DETECTED Final   Proteus species NOT DETECTED NOT DETECTED Final   Salmonella species NOT DETECTED NOT DETECTED Final   Serratia marcescens NOT DETECTED NOT DETECTED Final   Haemophilus influenzae NOT DETECTED NOT DETECTED Final   Neisseria meningitidis NOT DETECTED NOT DETECTED Final   Pseudomonas aeruginosa NOT DETECTED NOT DETECTED Final   Stenotrophomonas maltophilia NOT DETECTED NOT DETECTED Final   Candida albicans NOT DETECTED NOT DETECTED Final   Candida auris NOT DETECTED NOT DETECTED Final   Candida glabrata NOT DETECTED NOT DETECTED Final   Candida krusei NOT DETECTED NOT DETECTED Final   Candida parapsilosis NOT DETECTED NOT DETECTED Final   Candida tropicalis NOT DETECTED NOT DETECTED Final   Cryptococcus neoformans/gattii NOT DETECTED NOT DETECTED Final   CTX-M ESBL NOT DETECTED NOT DETECTED Final   Carbapenem resistance IMP NOT DETECTED NOT DETECTED Final   Carbapenem resistance KPC NOT DETECTED NOT DETECTED Final   Carbapenem resistance NDM NOT DETECTED NOT DETECTED Final   Carbapenem resist OXA 48 LIKE NOT DETECTED NOT DETECTED Final   Carbapenem resistance VIM NOT DETECTED NOT DETECTED Final    Comment: Performed at [facility], [address], [place], [place] [address]         Radiology Studies: No results found.      Scheduled Meds:  atorvastatin  20 mg Oral QPM   carbidopa-levodopa  2 tablet Oral TID WC   enoxaparin (LOVENOX) injection  40 mg Subcutaneous Q24H   escitalopram  20 mg Oral Daily   loratadine  10 mg Oral Daily   midodrine  5 mg Oral TID WC   polyethylene glycol  17 g Oral BID   sulfamethoxazole-trimethoprim  2 tablet Oral Q12H   tamsulosin  0.4 mg Oral Daily   traZODone  100 mg Oral QHS   Continuous Infusions:     LOS: 6 days    Time spent: 35 minutes    Dorcas Carrow, MD [facility]

## 2022-10-28 NOTE — Progress Notes (Signed)
Physical Therapy Treatment Patient Details Name: Tony [name] MRN: [id number] DOB: January 09, 1946 Today's Date: 10/28/2022   History of Present Illness 77 y.o. male who presented 10/21/22 with altered mental status and weakness. 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    PT Comments  Pt received in supine, knee-high compression socks donned and pt anxious to participate in therapy session due to eagerness to progress strength for DC. Pt remains limited due to symptomatic orthostatic hypotension despite compression socks, RN/MD notified. Pt tolerates standing ~2 mins at bedside with RW for 2 trials and up to modA +2 physical assist, pt needing max encouragement to stand for more than a few seconds. Pt reports severe fatigue >7/10 modified RPE at end of sesison.  Pt continues to benefit from PT services to progress toward functional mobility goals.   10/28/22 1541  Orthostatic Lying   BP- Lying 112/66  Pulse- Lying 83  Orthostatic Sitting  BP- Sitting 102/72  Pulse- Sitting 94  Orthostatic Standing at 0 minutes  BP- Standing at 0 minutes (!) 79/50  Pulse- Standing at 0 minutes 105     If plan is discharge home, recommend the following: Two people to help with walking and/or transfers;Assistance with feeding;Direct supervision/assist for medications management;Direct supervision/assist for financial management;Assist for transportation;Supervision due to cognitive status   Can travel by private vehicle     No  Equipment Recommendations  None recommended by PT;Other (comment)    Recommendations for Other Services       Precautions / Restrictions Precautions Precautions: Fall Precaution Comments: orthostatic hypotension, L hemineglect, freezing episodes Required Braces or Orthoses:  (bil compression socks) Restrictions Weight Bearing Restrictions:  No     Mobility  Bed Mobility Overal bed mobility: Needs Assistance Bed Mobility: Supine to Sit, Rolling, Sit to Supine Rolling: Min assist, Used rails Sidelying to sit: Min assist, Used rails, HOB elevated   Sit to supine: Min assist, Used rails   General bed mobility comments: with cues pt able to initiate moving LE's off EOB. cues and min assist to guide use of bed rail to pivot hips and raise trunk upright. MinA for anterior scooting to foot flat and CGA to Supervision for seated balance at EOB.    Transfers Overall transfer level: Needs assistance Equipment used: Rolling walker (2 wheels) Transfers: Sit to/from Stand, Bed to chair/wheelchair/BSC Sit to Stand: From elevated surface, +2 safety/equipment, Mod assist, Min assist   Step pivot transfers: Mod assist, +2 safety/equipment       General transfer comment: ModA from EOB with NT standing by for safety, pt cued to push from EOB or knees but unable to understand and becomes agitated, so kept BUE on RW handles. Pt c/o lightheadedness upon standing at EOB with RW and standing ~2 mins while BP checking prior to needing to sit due to symptoms. After rest break, pt able to stand again and found to have symptomatic orthostatic hypotension, but was able to take a few lateral steps toward [facility] with +1 modA to simulate step pivot transfer. Pt defers transfer OOB to chair due to fatigue after sitting in chair earlier in the day. ModA for lateral seated scooting toward his L side due to L hemineglect.    Ambulation/Gait             Pre-gait activities: a few lateral steps toward his L side (HOB) with tactile and verbal cues  for sidesteps toward his L side. General Gait Details: pt too orthostatic to perform today, with c/o severe fatigue after standing trials x2 at bedside   Stairs             Wheelchair Mobility     Tilt Bed    Modified Rankin (Stroke Patients Only)       Balance Overall balance assessment: Needs  assistance Sitting-balance support: Feet supported, Single extremity supported Sitting balance-Leahy Scale: Fair Sitting balance - Comments: reliant on single rail but sitting with close Supervision after PTA assists to position him with feet flat/rail support; pt c/o initial lightheadedness which resolves after a few mins sitting, seated BP stable Postural control: Posterior lean Standing balance support: Bilateral upper extremity supported, Reliant on assistive device for balance, During functional activity Standing balance-Leahy Scale: Poor Standing balance comment: +1-2 assist for safety standing at RW, posterior lean increases as orthostatic symptoms increase; only able to step/weight shift minimally                            Cognition Arousal: Alert Behavior During Therapy: Flat affect, Anxious Overall Cognitive Status: Impaired/Different from baseline Area of Impairment: Following commands, Safety/judgement, Awareness, Problem solving, Memory, Attention, Orientation                 Orientation Level: Disoriented to, Time Current Attention Level: Focused Memory: Decreased short-term memory Following Commands: Follows one step commands inconsistently, Follows one step commands with increased time Safety/Judgement: Decreased awareness of safety, Decreased awareness of deficits Awareness: Intellectual Problem Solving: Slow processing, Decreased initiation, Difficulty sequencing, Requires verbal cues, Requires tactile cues General Comments: Pt following 50% of simple commands, difficulty switching from one task to another. Decreased safety awareness and environmental awareness. Pt noted to have symptomatic orthostatic hypotension in standing which may contribute to difficulty following commands while standing and sitting.        Exercises Other Exercises Other Exercises: STS x2 for BLE strengthening Other Exercises: seated BUE AROM: rowing (shoulder/elbow flex/ext),  x10 reps ea (improved ROM on R side) Other Exercises: standing hip flexion/weight shift x3-5 reps ea    General Comments General comments (skin integrity, edema, etc.): see comments above for orthostatics; SpO2 WFL on RA      Pertinent Vitals/Pain Pain Assessment Pain Assessment: Faces Faces Pain Scale: Hurts a little bit Pain Location: generalized with mobility, back Pain Descriptors / Indicators: Discomfort, Grimacing, Aching Pain Intervention(s): Limited activity within patient's tolerance, Monitored during session, Repositioned    Home Living                          Prior Function            PT Goals (current goals can now be found in the care plan section) Acute Rehab PT Goals Patient Stated Goal: unable to state; wife hopes he will become ambulatory enough to return to [facility] PT Goal Formulation: With family Time For Goal Achievement: 11/05/22 Progress towards PT goals: Progressing toward goals    Frequency    Min 1X/week      PT Plan      Co-evaluation              AM-PAC PT "6 Clicks" Mobility   Outcome Measure  Help needed turning from your back to your side while in a flat bed without using bedrails?: A Little Help needed moving from lying on your back  to sitting on the side of a flat bed without using bedrails?: A Lot (without rails) Help needed moving to and from a bed to a chair (including a wheelchair)?: A Lot Help needed standing up from a chair using your arms (e.g., wheelchair or bedside chair)?: A Lot Help needed to walk in hospital room?: Total Help needed climbing 3-5 steps with a railing? : Total 6 Click Score: 11    End of Session Equipment Utilized During Treatment: Gait belt Activity Tolerance: Patient tolerated treatment well Patient left: in chair;with call bell/phone within reach;with family/visitor present Nurse Communication: Mobility status;Other (comment) (+2 for safety with transfers) PT Visit Diagnosis:  Unsteadiness on feet (R26.81);Other abnormalities of gait and mobility (R26.89);History of falling (Z91.81);Muscle weakness (generalized) (M62.81)     Time: 1308-6578 PT Time Calculation (min) (ACUTE ONLY): 28 min  Charges:    $Therapeutic Exercise: 8-22 mins $Therapeutic Activity: 8-22 mins PT General Charges $$ ACUTE PT VISIT: 1 Visit                     Emily Massar P., PTA Acute Rehabilitation Services Secure Chat Preferred 9a-5:30pm Office: [phone]    Dorathy Kinsman [facility] 10/28/2022, 4:22 PM

## 2022-10-28 NOTE — Plan of Care (Signed)
  Problem: Activity: Goal: Risk for activity intolerance will decrease Outcome: Progressing   

## 2022-10-29 DIAGNOSIS — A419 Sepsis, unspecified organism: Secondary | ICD-10-CM | POA: Diagnosis not present

## 2022-10-29 DIAGNOSIS — N390 Urinary tract infection, site not specified: Secondary | ICD-10-CM | POA: Diagnosis not present

## 2022-10-29 LAB — CORTISOL-AM, BLOOD: Cortisol - AM: 14.7 ug/dL (ref 6.7–22.6)

## 2022-10-29 MED ORDER — MIDODRINE HCL 5 MG PO TABS
10.0000 mg | ORAL_TABLET | Freq: Three times a day (TID) | ORAL | Status: DC
  Administered 2022-10-29 – 2022-11-04 (×19): 10 mg via ORAL
  Filled 2022-10-29 (×19): qty 2

## 2022-10-29 NOTE — Progress Notes (Signed)
Occupational Therapy Treatment Patient Details Name: Tony [name] MRN: [id number] DOB: 01/16/1946 Today's Date: 10/29/2022   History of present illness 77 y.o. male who presented 10/21/22 with altered mental status and weakness. 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   OT comments  Pt is progressing towards OT goals this session. Bil knee high compression socks on, max A to don grippy socks in supine. Then session focused on direction following, activity tolerance, orthostatic measurements, and ability to go sit<>stand working towards grooming tasks. Pt remains min guard EOB for grooming tasks such as brushing hair, requires more assist for multi-step tasks like oral care (especially in initiation) and is mod A with strong bracing on bed for sit<>stand. Pt symptomatic in standing for orthostatics (light headed). OT will continue to follow acutely, POC remains appropriate at this time.     10/29/22 1100  Orthostatic Lying   BP- Lying 112/69  Orthostatic Sitting  BP- Sitting 103/76  Pulse- Sitting 98  Orthostatic Standing at 0 minutes  BP- Standing at 0 minutes (!) 71/49  Pulse- Standing at 0 minutes 102        If plan is discharge home, recommend the following:  A little help with walking and/or transfers;A lot of help with bathing/dressing/bathroom;Assistance with cooking/housework;Supervision due to cognitive status;Direct supervision/assist for financial management;Assist for transportation;Help with stairs or ramp for entrance;Direct supervision/assist for medications management   Equipment Recommendations  Other (comment) (defer)    Recommendations for Other Services PT consult    Precautions / Restrictions Precautions Precautions: Fall Precaution Comments: orthostatic hypotension, L hemineglect, freezing episodes Required Braces or  Orthoses:  (Bil compression socks) Restrictions Weight Bearing Restrictions: No       Mobility Bed Mobility Overal bed mobility: Needs Assistance Bed Mobility: Supine to Sit, Sit to Supine     Supine to sit: Mod assist, HOB elevated, Used rails Sit to supine: Mod assist   General bed mobility comments: use of bed pad to assist with hips EOB, verbal and tactile cueing throughout,    Transfers Overall transfer level: Needs assistance Equipment used: Rolling walker (2 wheels) Transfers: Sit to/from Stand Sit to Stand: From elevated surface, Mod assist           General transfer comment: PT uses legs braced against the bed and mod A to come to standing frequent cues for hand placement and Pt attempting to pick RW up at some points.     Balance Overall balance assessment: Needs assistance Sitting-balance support: Feet supported, Single extremity supported Sitting balance-Leahy Scale: Fair   Postural control: Posterior lean Standing balance support: Bilateral upper extremity supported, Reliant on assistive device for balance, During functional activity Standing balance-Leahy Scale: Poor Standing balance comment: +1-2 assist for safety standing at RW, posterior lean increases as orthostatic symptoms increase; only able to step/weight shift minimally                           ADL either performed or assessed with clinical judgement   ADL Overall ADL's : Needs assistance/impaired     Grooming: Wash/dry face;Oral care;Minimal assistance;Sitting Grooming Details (indicate cue type and reason): cues for sequencing, provided with pre-loaded toothbrush, inattention to L hand, but once pt started task- he completed with min verbal cues             Lower Body Dressing: Maximal assistance;Bed  level Lower Body Dressing Details (indicate cue type and reason): to don socks             Functional mobility during ADLs: Moderate assistance;Rolling walker (2 wheels)       Extremity/Trunk Assessment              Vision       Perception     Praxis      Cognition Arousal: Alert Behavior During Therapy: Flat affect, Anxious Overall Cognitive Status: Impaired/Different from baseline Area of Impairment: Following commands, Safety/judgement, Awareness, Problem solving, Memory, Attention, Orientation                 Orientation Level: Disoriented to, Time Current Attention Level: Focused Memory: Decreased short-term memory Following Commands: Follows one step commands inconsistently, Follows one step commands with increased time Safety/Judgement: Decreased awareness of safety, Decreased awareness of deficits Awareness: Intellectual Problem Solving: Slow processing, Decreased initiation, Difficulty sequencing, Requires verbal cues, Requires tactile cues General Comments: Pt following 50% of simple commands, difficulty switching from one task to another. Decreased safety awareness and environmental awareness. Pt noted to have symptomatic orthostatic hypotension in standing which may contribute to difficulty following commands while standing and sitting.        Exercises Exercises: Other exercises Other Exercises Other Exercises: STS x4 for BLE strengthening Other Exercises: seated BUE AROM: rowing (shoulder/elbow flex/ext), x10 reps ea (improved ROM on R side) Other Exercises: heel slides in supine prior to coming EOB    Shoulder Instructions       General Comments see ortho static vitals    Pertinent Vitals/ Pain       Pain Assessment Pain Assessment: No/denies pain Pain Intervention(s): Monitored during session, Repositioned  Home Living                                          Prior Functioning/Environment              Frequency  Min 1X/week        Progress Toward Goals  OT Goals(current goals can now be found in the care plan section)  Progress towards OT goals: Progressing toward  goals  Acute Rehab OT Goals Patient Stated Goal: get as independent as possible OT Goal Formulation: With patient/family Time For Goal Achievement: 11/06/22 Potential to Achieve Goals: Good  Plan      Co-evaluation                 AM-PAC OT "6 Clicks" Daily Activity     Outcome Measure   Help from another person eating meals?: A Little Help from another person taking care of personal grooming?: A Little Help from another person toileting, which includes using toliet, bedpan, or urinal?: A Lot Help from another person bathing (including washing, rinsing, drying)?: A Lot Help from another person to put on and taking off regular upper body clothing?: A Little Help from another person to put on and taking off regular lower body clothing?: A Lot 6 Click Score: 15    End of Session Equipment Utilized During Treatment: Gait belt;Rolling walker (2 wheels)  OT Visit Diagnosis: Unsteadiness on feet (R26.81);Other abnormalities of gait and mobility (R26.89);History of falling (Z91.81);Muscle weakness (generalized) (M62.81)   Activity Tolerance Patient tolerated treatment well   Patient Left in bed;with call bell/phone within reach;with bed alarm set;with family/visitor present   Nurse Communication Mobility  status;Other (comment) (orthostatics)        Time: 4166-0630 OT Time Calculation (min): 25 min  Charges: OT General Charges $OT Visit: 1 Visit OT Treatments $Self Care/Home Management : 8-22 mins $Therapeutic Exercise: 8-22 mins  Nyoka Cowden OTR/L Acute Rehabilitation Services Office: [phone]  [facility] 10/29/2022, 11:40 AM

## 2022-10-29 NOTE — TOC Progression Note (Signed)
Transition of Care [facility]) - Progression Note    Patient Details  Name: Tony [name] MRN: [id number] Date of Birth: 1945-04-03  Transition of Care [facility] CM/SW Contact  Carley Hammed, LCSW Phone Number: 10/29/2022, 2:41 PM  Clinical Narrative:     CSW advised that that pt was approved for Ambulance transport [phone]. SNF auth still under review. MD noted orthostatics and will hold DC.  Family notified. TOC will continue to follow.       Expected Discharge Plan and Services                                               Social Determinants of Health (SDOH) Interventions SDOH Screenings   Food Insecurity: No Food Insecurity (09/04/2022)  Housing: Low Risk  (09/04/2022)  Transportation Needs: No Transportation Needs (09/04/2022)  Utilities: Not At Risk (09/04/2022)  Tobacco Use: High Risk (10/21/2022)    Readmission Risk Interventions    10/22/2022   12:12 PM 09/05/2022   11:23 AM  Readmission Risk Prevention Plan  Post Dischage Appt  Complete  Medication Screening  Complete  Transportation Screening Complete Complete  PCP or Specialist Appt within 5-7 Days Complete   Home Care Screening Complete   Medication Review (RN CM) Referral to Pharmacy

## 2022-10-29 NOTE — Progress Notes (Signed)
PROGRESS NOTE    Tony [name]  MVH:[id number] DOB: 14-Dec-1945 DOA: 10/21/2022 PCP: Chilton Greathouse, MD    Brief Narrative:  77 year old with Parkinson's, cognitive impairment, left hemineglect, dyspraxia, hyperlipidemia and anxiety presented with altered mental status and weakness.  Could not walk with a walker.  He lives in assisted living facility at [place].  Admitted with acute UTI , temperature 103, tachycardic, WBC count 18.  Found to have E. coli bacteremia. Remains in the hospital.  Waiting for SNF bed availability.  Also has significant orthostatic hypotension causing difficulty with mobility.   Assessment & Plan:   Sepsis present on admission secondary to E. coli UTI and bacteremia.  Presented with fever, tachycardia and leukocytosis. Sepsis physiology improving. Blood cultures with E. coli no resistance.  Unfortunately urine cultures were not collected. Received Rocephin for 3 days, changing to Bactrim to complete 7 days of therapy.   Renal ultrasound did not show any hydronephrosis or urine retention.  Does have prostatomegaly.  He is on Flomax.  No significant postvoid residuals.  Orthostatic hypotension: Significant orthostatic hypotension with drop in systolic blood pressure more than 20.  Not relieved with IV fluids. Started on midodrine 5 mg 3 times daily.  Compression socks.  Frequent mobility and upright posture.  Orthostatic precautions. Flomax may cause orthostatic drop in blood pressure, however he has significant prostatism.  Will continue. Morning cortisol levels were adequate.  Chronic medical issues including Parkinson's, stable on Sinemet Hyperlipidemia, on statin  Debility deconditioning: Significant debility and deconditioning.  Recommending SNF.  Medically stable.  DVT prophylaxis: enoxaparin (LOVENOX) injection 40 mg Start: 10/22/22 1000   Code Status: Full code Family Communication: Wife at the bedside. Disposition Plan: Status is:  Inpatient Remains inpatient appropriate because: Medically stable.  Waiting for SNF.     Consultants:  None  Procedures:  None  Antimicrobials:  Rocephin 8/27--- 8/29 Bactrim 8/29--completed.   Subjective:  Patient seen and examined.  Patient tells me that he slept better last night.  Wife at the bedside.  She thinks he is more coherent and interactive today. He did better with trazodone 100 mg at night for insomnia.   Objective: Vitals:   10/28/22 1805 10/28/22 1954 10/29/22 0525 10/29/22 0859  BP: 106/71 117/83 123/76 103/64  Pulse: 99 85 67 76  Resp: 17 17 18 18   Temp: 98.2 F (36.8 C) 98.5 F (36.9 C) 98.1 F (36.7 C) (!) 97 F (36.1 C)  TempSrc: Oral Oral    SpO2: 99% 97% 96% 96%  Weight:      Height:        Intake/Output Summary (Last 24 hours) at 10/29/2022 1053 Last data filed at 10/29/2022 0908 Gross per 24 hour  Intake 600 ml  Output 680 ml  Net -80 ml   Filed Weights   10/21/22 1849  Weight: 88 kg    Examination:  General exam: In normal mood today.  Interactive.  On room air.  Looks comfortable. Respiratory system: No added sounds. Cardiovascular system: S1 & S2 heard, RRR.  Gastrointestinal system: Abdomen is nondistended, soft and nontender. No organomegaly or masses felt. Normal bowel sounds heard. Central nervous system: Alert and oriented. No focal neurological deficits.  Generalized weakness.    Data Reviewed: I have personally reviewed following labs and imaging studies  CBC: Recent Labs  Lab 10/23/22 0503  WBC 9.3  HGB 8.4*  HCT 26.7*  MCV 65.1*  PLT 146*   Basic Metabolic Panel: Recent Labs  Lab 10/23/22 0503  NA  135  K 4.0  CL 103  CO2 23  GLUCOSE 100*  BUN 13  CREATININE 0.80  CALCIUM 8.2*   GFR: Estimated Creatinine Clearance: 82.2 mL/min (by C-G formula based on SCr of 0.8 mg/dL). Liver Function Tests: Recent Labs  Lab 10/23/22 0503  AST 15  ALT 16  ALKPHOS 59  BILITOT 0.4  PROT 5.7*  ALBUMIN 2.6*    No results for input(s): "LIPASE", "AMYLASE" in the last 168 hours. No results for input(s): "AMMONIA" in the last 168 hours. Coagulation Profile: No results for input(s): "INR", "PROTIME" in the last 168 hours.  Cardiac Enzymes: No results for input(s): "CKTOTAL", "CKMB", "CKMBINDEX", "TROPONINI" in the last 168 hours. BNP (last 3 results) No results for input(s): "PROBNP" in the last 8760 hours. HbA1C: No results for input(s): "HGBA1C" in the last 72 hours. CBG: Recent Labs  Lab 10/27/22 0715 10/27/22 1138 10/27/22 1631 10/27/22 2019 10/28/22 0729  GLUCAP 84 102* 123* 115* 95   Lipid Profile: No results for input(s): "CHOL", "HDL", "LDLCALC", "TRIG", "CHOLHDL", "LDLDIRECT" in the last 72 hours. Thyroid Function Tests: No results for input(s): "TSH", "T4TOTAL", "FREET4", "T3FREE", "THYROIDAB" in the last 72 hours. Anemia Panel: No results for input(s): "VITAMINB12", "FOLATE", "FERRITIN", "TIBC", "IRON", "RETICCTPCT" in the last 72 hours. Sepsis Labs: No results for input(s): "PROCALCITON", "LATICACIDVEN" in the last 168 hours.   Recent Results (from the past 240 hour(s))  Resp panel by RT-PCR (RSV, Flu A&B, Covid) Anterior Nasal Swab     Status: None   Collection Time: 10/21/22  8:17 PM   Specimen: Anterior Nasal Swab  Result Value Ref Range Status   SARS Coronavirus 2 by RT PCR NEGATIVE NEGATIVE Final   Influenza A by PCR NEGATIVE NEGATIVE Final   Influenza B by PCR NEGATIVE NEGATIVE Final    Comment: (NOTE) The Xpert Xpress SARS-CoV-2/FLU/RSV plus assay is intended as an aid in the diagnosis of influenza from Nasopharyngeal swab specimens and should not be used as a sole basis for treatment. Nasal washings and aspirates are unacceptable for Xpert Xpress SARS-CoV-2/FLU/RSV testing.  Fact Sheet for Patients: BloggerCourse.com  Fact Sheet for Healthcare Providers: SeriousBroker.it  This test is not yet approved  or cleared by the [place] FDA and has been authorized for detection and/or diagnosis of SARS-CoV-2 by FDA under an Emergency Use Authorization (EUA). This EUA will remain in effect (meaning this test can be used) for the duration of the COVID-19 declaration under Section 564(b)(1) of the Act, 21 U.S.C. section 360bbb-3(b)(1), unless the authorization is terminated or revoked.     Resp Syncytial Virus by PCR NEGATIVE NEGATIVE Final    Comment: (NOTE) Fact Sheet for Patients: BloggerCourse.com  Fact Sheet for Healthcare Providers: SeriousBroker.it  This test is not yet approved or cleared by the [place] FDA and has been authorized for detection and/or diagnosis of SARS-CoV-2 by FDA under an Emergency Use Authorization (EUA). This EUA will remain in effect (meaning this test can be used) for the duration of the COVID-19 declaration under Section 564(b)(1) of the Act, 21 U.S.C. section 360bbb-3(b)(1), unless the authorization is terminated or revoked.  Performed at [facility], [address], [place], [place] [address]   Blood Culture (routine x 2)     Status: Abnormal   Collection Time: 10/21/22  8:17 PM   Specimen: BLOOD RIGHT FOREARM  Result Value Ref Range Status   Specimen Description BLOOD RIGHT FOREARM  Final   Special Requests   Final  BOTTLES DRAWN AEROBIC AND ANAEROBIC Blood Culture results may not be optimal due to an excessive volume of blood received in culture bottles   Culture  Setup Time   Final    GRAM NEGATIVE RODS AEROBIC BOTTLE ONLY CRITICAL VALUE NOTED.  VALUE IS CONSISTENT WITH PREVIOUSLY REPORTED AND CALLED VALUE.    Culture (A)  Final    ESCHERICHIA COLI SUSCEPTIBILITIES PERFORMED ON PREVIOUS CULTURE WITHIN THE LAST 5 DAYS. Performed at [facility], [address] [place], [place] [address]    Report Status 10/24/2022 FINAL  Final  Blood Culture (routine x 2)     Status:  Abnormal   Collection Time: 10/21/22  8:17 PM   Specimen: BLOOD LEFT HAND  Result Value Ref Range Status   Specimen Description BLOOD LEFT HAND  Final   Special Requests   Final    BOTTLES DRAWN AEROBIC ONLY Blood Culture adequate volume   Culture  Setup Time   Final    GRAM NEGATIVE RODS AEROBIC BOTTLE ONLY CRITICAL RESULT CALLED TO, READ BACK BY AND VERIFIED WITH: PHARMD SHELBY WISE ON 10/22/22 @ 1714 [facility] DRT Performed at [facility], [address] [place], [place] [address]    Culture ESCHERICHIA COLI (A)  Final   Report Status 10/24/2022 FINAL  Final   Organism ID, Bacteria ESCHERICHIA COLI  Final      Susceptibility   Escherichia coli - MIC*    AMPICILLIN >=32 RESISTANT Resistant     CEFEPIME <=0.12 SENSITIVE Sensitive     CEFTAZIDIME <=1 SENSITIVE Sensitive     CEFTRIAXONE <=0.25 SENSITIVE Sensitive     CIPROFLOXACIN <=0.25 SENSITIVE Sensitive     GENTAMICIN <=1 SENSITIVE Sensitive     IMIPENEM <=0.25 SENSITIVE Sensitive     TRIMETH/SULFA <=20 SENSITIVE Sensitive     AMPICILLIN/SULBACTAM 16 INTERMEDIATE Intermediate     PIP/TAZO <=4 SENSITIVE Sensitive     * ESCHERICHIA COLI  Blood Culture ID Panel (Reflexed)     Status: Abnormal   Collection Time: 10/21/22  8:17 PM  Result Value Ref Range Status   Enterococcus faecalis NOT DETECTED NOT DETECTED Final   Enterococcus Faecium NOT DETECTED NOT DETECTED Final   Listeria monocytogenes NOT DETECTED NOT DETECTED Final   Staphylococcus species NOT DETECTED NOT DETECTED Final   Staphylococcus aureus (BCID) NOT DETECTED NOT DETECTED Final   Staphylococcus epidermidis NOT DETECTED NOT DETECTED Final   Staphylococcus lugdunensis NOT DETECTED NOT DETECTED Final   Streptococcus species NOT DETECTED NOT DETECTED Final   Streptococcus agalactiae NOT DETECTED NOT DETECTED Final   Streptococcus pneumoniae NOT DETECTED NOT DETECTED Final   Streptococcus pyogenes NOT DETECTED NOT DETECTED Final   A.calcoaceticus-baumannii NOT  DETECTED NOT DETECTED Final   Bacteroides fragilis NOT DETECTED NOT DETECTED Final   Enterobacterales DETECTED (A) NOT DETECTED Final    Comment: Enterobacterales represent a large order of gram negative bacteria, not a single organism. CRITICAL RESULT CALLED TO, READ BACK BY AND VERIFIED WITH: PHARMD SHELBY WISE ON 10/22/22 @ 1714 [facility] DRT    Enterobacter cloacae complex NOT DETECTED NOT DETECTED Final   Escherichia coli DETECTED (A) NOT DETECTED Final    Comment: CRITICAL RESULT CALLED TO, READ BACK BY AND VERIFIED WITH: PHARMD SHELBY WISE ON 10/22/22 @ 1714 BY[facility] DRT    Klebsiella aerogenes NOT DETECTED NOT DETECTED Final   Klebsiella oxytoca NOT DETECTED NOT DETECTED Final   Klebsiella pneumoniae NOT DETECTED NOT DETECTED Final   Proteus species NOT DETECTED NOT DETECTED Final  Salmonella species NOT DETECTED NOT DETECTED Final   Serratia marcescens NOT DETECTED NOT DETECTED Final   Haemophilus influenzae NOT DETECTED NOT DETECTED Final   Neisseria meningitidis NOT DETECTED NOT DETECTED Final   Pseudomonas aeruginosa NOT DETECTED NOT DETECTED Final   Stenotrophomonas maltophilia NOT DETECTED NOT DETECTED Final   Candida albicans NOT DETECTED NOT DETECTED Final   Candida auris NOT DETECTED NOT DETECTED Final   Candida glabrata NOT DETECTED NOT DETECTED Final   Candida krusei NOT DETECTED NOT DETECTED Final   Candida parapsilosis NOT DETECTED NOT DETECTED Final   Candida tropicalis NOT DETECTED NOT DETECTED Final   Cryptococcus neoformans/gattii NOT DETECTED NOT DETECTED Final   CTX-M ESBL NOT DETECTED NOT DETECTED Final   Carbapenem resistance IMP NOT DETECTED NOT DETECTED Final   Carbapenem resistance KPC NOT DETECTED NOT DETECTED Final   Carbapenem resistance NDM NOT DETECTED NOT DETECTED Final   Carbapenem resist OXA 48 LIKE NOT DETECTED NOT DETECTED Final   Carbapenem resistance VIM NOT DETECTED NOT DETECTED Final    Comment: Performed at [facility], [address], [place], [place] [address]         Radiology Studies: No results found.      Scheduled Meds:  atorvastatin  20 mg Oral QPM   carbidopa-levodopa  2 tablet Oral TID WC   enoxaparin (LOVENOX) injection  40 mg Subcutaneous Q24H   escitalopram  20 mg Oral Daily   loratadine  10 mg Oral Daily   midodrine  5 mg Oral TID WC   polyethylene glycol  17 g Oral BID   tamsulosin  0.4 mg Oral Daily   traZODone  100 mg Oral QHS   Continuous Infusions:     LOS: 7 days    Time spent: 35 minutes    Dorcas Carrow, MD [facility]

## 2022-10-29 NOTE — Care Plan (Signed)
Patient is still with significant orthostatic symptoms and drop in blood pressure. Increase midodrine 10 mg 3 times daily, continue compression stockings.  Careful mobility.  Hopefully we can improve his blood pressures to send him to SNF by tomorrow.

## 2022-10-29 NOTE — TOC Progression Note (Signed)
Transition of Care [facility]) - Progression Note    Patient Details  Name: Tony [name] MRN: [id number] Date of Birth: 1945/10/11  Transition [facility]) CM/SW Contact  Carley Hammed, LCSW Phone Number: 10/29/2022, 11:26 AM  Clinical Narrative:     Per documentation, pt is medically stable to DC to SNF. Authorization for SNF and ambulance started with HTA and pending. Facility updated to current barriers. TOC will continue to follow for DC needs.        Expected Discharge Plan and Services                                               Social Determinants of Health (SDOH) Interventions SDOH Screenings   Food Insecurity: No Food Insecurity (09/04/2022)  Housing: Low Risk  (09/04/2022)  Transportation Needs: No Transportation Needs (09/04/2022)  Utilities: Not At Risk (09/04/2022)  Tobacco Use: High Risk (10/21/2022)    Readmission Risk Interventions    10/22/2022   12:12 PM 09/05/2022   11:23 AM  Readmission Risk Prevention Plan  Post Dischage Appt  Complete  Medication Screening  Complete  Transportation Screening Complete Complete  PCP or Specialist Appt within 5-7 Days Complete   Home Care Screening Complete   Medication Review (RN CM) Referral to Pharmacy

## 2022-10-30 ENCOUNTER — Other Ambulatory Visit: Payer: Self-pay | Admitting: Internal Medicine

## 2022-10-30 DIAGNOSIS — A419 Sepsis, unspecified organism: Secondary | ICD-10-CM | POA: Diagnosis not present

## 2022-10-30 DIAGNOSIS — N390 Urinary tract infection, site not specified: Secondary | ICD-10-CM | POA: Diagnosis not present

## 2022-10-30 NOTE — Progress Notes (Addendum)
Physical Therapy Treatment Patient Details Name: Tony [name] MRN: [id number] DOB: 04/13/1945 Today's Date: 10/30/2022   History of Present Illness 77 y.o. male who presented 10/21/22 with altered mental status and weakness. 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    PT Comments  Pt received in supine, agreeable to therapy session with sister present providing encouragement for him to mobilize OOB. Pt needing increased assist transfer from supine to sitting on L EOB due to L hemibody neglect and increased +2 mod/maxA for static standing at RW due to posterior bias and progressive fatigue. Pt BP with drop >20 points from supine to sitting, but SBP stable with sit<>stand with abdominal binder donned and pt reports less dizziness than previous date's session, endorses fatigue. Recommend pt continue wearing abdominal binder for any transfers (to EOB or when standing) and pt agreeable to sit up in recliner at end of session. Pt continues to benefit from PT services to progress toward functional mobility goals.  Vital Signs   Patient Position (if appropriate) Orthostatic Vitals  Orthostatic Lying   BP- Lying (!) 129/96   Pulse- Lying 86  Orthostatic Sitting  BP- Sitting 107/78 (89)  Pulse- Sitting 108  Orthostatic Standing at 0 minutes (1532 pm)  BP- Standing at 0 minutes 105/61 (69)  Pulse- Standing at 0 minutes 122       10/30/22 1540 pm  Orthostatic Sitting  BP- Sitting 113/83  (post-exertion in chair)  Pulse- Sitting 119    If plan is discharge home, recommend the following: Two people to help with walking and/or transfers;Assistance with feeding;Direct supervision/assist for medications management;Direct supervision/assist for financial management;Assist for transportation;Supervision due to cognitive status   Can travel by private vehicle      No  Equipment Recommendations  None recommended by PT;Other (comment) (TBD post-acute)    Recommendations for Other Services       Precautions / Restrictions Precautions Precautions: Fall Precaution Comments: orthostatic hypotension, L hemineglect, freezing episodes Required Braces or Orthoses: Other Brace (Bil compression socks) Other Brace: abdominal binder Restrictions Weight Bearing Restrictions: No     Mobility  Bed Mobility Overal bed mobility: Needs Assistance Bed Mobility: Rolling, Sidelying to Sit Rolling: Max assist, Used rails Sidelying to sit: Mod assist, Used rails       General bed mobility comments: use of bed pad to assist with hips to EOB, verbal and tactile cueing throughout. Pt needing increased to sit on L EOB due to L hemineglect and LUE apparent apraxia (consistent with previous sessions). Posterior lean upon sitting EOB.    Transfers Overall transfer level: Needs assistance Equipment used: Rolling walker (2 wheels) Transfers: Sit to/from Stand Sit to Stand: From elevated surface, Mod assist, Max assist, +2 safety/equipment   Step pivot transfers: Mod assist, +2 safety/equipment, From elevated surface       General transfer comment: PT uses legs braced against the bed and mod A to come to standing frequent cues for hand placement and pt holding RW far advanced with posterior lean initially which increased to maxA +2 assist needed as he fatigued with stiff, board-like posture and posterior bias. Seated break, then pt stands and pivots to chair on his R side with modA (+2 safety).    Ambulation/Gait               General Gait Details: c/o severe fatigue after pivot from bed>chair, pt defers gait  trial once in chair but agreeable to sit up, family in room to visit with him   Stairs             Wheelchair Mobility     Tilt Bed    Modified Rankin (Stroke Patients Only)       Balance Overall balance assessment: Needs  assistance Sitting-balance support: Feet supported, Single extremity supported Sitting balance-Leahy Scale: Fair   Postural control: Posterior lean Standing balance support: Bilateral upper extremity supported, Reliant on assistive device for balance, During functional activity Standing balance-Leahy Scale: Poor (zero initially progressing to poor) Standing balance comment: +1-2 assist for safety standing at RW, posterior lean increases as fatigue increases                            Cognition Arousal: Alert Behavior During Therapy: Flat affect, Anxious Overall Cognitive Status: Impaired/Different from baseline Area of Impairment: Following commands, Safety/judgement, Awareness, Problem solving, Memory, Attention, Orientation                   Current Attention Level: Focused Memory: Decreased short-term memory Following Commands: Follows one step commands with increased time Safety/Judgement: Decreased awareness of safety, Decreased awareness of deficits Awareness: Intellectual Problem Solving: Slow processing, Decreased initiation, Difficulty sequencing, Requires verbal cues, Requires tactile cues General Comments: Pt following ~75% of simple commands with increased time to respond, difficulty switching from one task to another. Decreased safety awareness and environmental awareness. Pt very stiff throughout and appears anxious in anticipation of OOB mobility but participates as able; multimodal cues. L apraxia/decreased L motor control.        Exercises Other Exercises Other Exercises: seated BLE AROM: hip flexion x10 reps ea at EOB, LAQ x10 reps ea (LAQ with back support from chair) Other Exercises: seated pursed-lip breathing x10 reps with emphasis on midline posture (single UE support) Other Exercises: static standing at RW, cues for midline posture/weight shifting, ~3 mins for strengthening prior to sitting (+2 assist)    General Comments General comments  (skin integrity, edema, etc.): see orthostatic BP readings above; SpO2 WFL on RA; pt reports less dizziness today with abdominal binder donned      Pertinent Vitals/Pain Pain Assessment Pain Assessment: PAINAD Breathing: occasional labored breathing, short period of hyperventilation Negative Vocalization: none Facial Expression: sad, frightened, frown Body Language: tense, distressed pacing, fidgeting Consolability: distracted or reassured by voice/touch PAINAD Score: 4 Pain Location: not localized Pain Descriptors / Indicators: Discomfort, Other (Comment) ("it feels weird") Pain Intervention(s): Monitored during session, Limited activity within patient's tolerance, Repositioned     PT Goals (current goals can now be found in the care plan section) Acute Rehab PT Goals PT Goal Formulation: With family Time For Goal Achievement: 11/05/22 Progress towards PT goals: Progressing toward goals    Frequency    Min 1X/week      PT Plan         AM-PAC PT "6 Clicks" Mobility   Outcome Measure  Help needed turning from your back to your side while in a flat bed without using bedrails?: A Lot Help needed moving from lying on your back to sitting on the side of a flat bed without using bedrails?: A Lot Help needed moving to and from a bed to a chair (including a wheelchair)?: A Lot Help needed standing up from a chair using your arms (e.g., wheelchair or bedside chair)?: Total Help needed to walk in hospital room?: Total Help  needed climbing 3-5 steps with a railing? : Total 6 Click Score: 9    End of Session Equipment Utilized During Treatment: Gait belt;Other (comment) (abdominal binder, compression socks (knee-high)) Activity Tolerance: Patient tolerated treatment well;Patient limited by fatigue Patient left: in chair;with call bell/phone within reach;with chair alarm set;with family/visitor present Nurse Communication: Mobility status;Other (comment) (+2 to pivot back to  bed) PT Visit Diagnosis: Unsteadiness on feet (R26.81);Other abnormalities of gait and mobility (R26.89);History of falling (Z91.81);Muscle weakness (generalized) (M62.81)     Time: 1610-9604 PT Time Calculation (min) (ACUTE ONLY): 29 min  Charges:    $Therapeutic Exercise: 8-22 mins $Therapeutic Activity: 8-22 mins PT General Charges $$ ACUTE PT VISIT: 1 Visit                     Kiam Bransfield P., PTA Acute Rehabilitation Services Secure Chat Preferred 9a-5:30pm Office: [phone]    Dorathy Kinsman [facility] 10/30/2022, 4:05 PM

## 2022-10-30 NOTE — Plan of Care (Signed)
  Problem: Health Behavior/Discharge Planning: Goal: Ability to manage health-related needs will improve 10/30/2022 2035 by Irwin Brakeman, RN Outcome: Progressing 10/30/2022 1813 by Irwin Brakeman, RN Outcome: Progressing   Problem: Clinical Measurements: Goal: Ability to maintain clinical measurements within normal limits will improve 10/30/2022 2035 by Irwin Brakeman, RN Outcome: Progressing 10/30/2022 1813 by Irwin Brakeman, RN Outcome: Progressing Goal: Will remain free from infection 10/30/2022 2035 by Irwin Brakeman, RN Outcome: Progressing 10/30/2022 1813 by Irwin Brakeman, RN Outcome: Progressing Goal: Diagnostic test results will improve 10/30/2022 2035 by Irwin Brakeman, RN Outcome: Progressing 10/30/2022 1813 by Irwin Brakeman, RN Outcome: Progressing Goal: Respiratory complications will improve 10/30/2022 2035 by Irwin Brakeman, RN Outcome: Progressing 10/30/2022 1813 by Irwin Brakeman, RN Outcome: Progressing Goal: Cardiovascular complication will be avoided 10/30/2022 2035 by Irwin Brakeman, RN Outcome: Progressing 10/30/2022 1813 by Irwin Brakeman, RN Outcome: Progressing   Problem: Activity: Goal: Risk for activity intolerance will decrease 10/30/2022 2035 by Irwin Brakeman, RN Outcome: Progressing 10/30/2022 1813 by Irwin Brakeman, RN Outcome: Progressing   Problem: Nutrition: Goal: Adequate nutrition will be maintained 10/30/2022 2035 by Irwin Brakeman, RN Outcome: Progressing 10/30/2022 1813 by Irwin Brakeman, RN Outcome: Progressing   Problem: Coping: Goal: Level of anxiety will decrease 10/30/2022 2035 by Irwin Brakeman, RN Outcome: Progressing 10/30/2022 1813 by Irwin Brakeman, RN Outcome: Progressing   Problem: Elimination: Goal: Will not experience complications related to bowel motility 10/30/2022 2035 by Irwin Brakeman,  RN Outcome: Progressing 10/30/2022 1813 by Irwin Brakeman, RN Outcome: Progressing Goal: Will not experience complications related to urinary retention 10/30/2022 2035 by Irwin Brakeman, RN Outcome: Progressing 10/30/2022 1813 by Irwin Brakeman, RN Outcome: Progressing   Problem: Pain Managment: Goal: General experience of comfort will improve 10/30/2022 2035 by Irwin Brakeman, RN Outcome: Progressing 10/30/2022 1813 by Irwin Brakeman, RN Outcome: Progressing   Problem: Safety: Goal: Ability to remain free from injury will improve 10/30/2022 2035 by Irwin Brakeman, RN Outcome: Progressing 10/30/2022 1813 by Irwin Brakeman, RN Outcome: Progressing   Problem: Skin Integrity: Goal: Risk for impaired skin integrity will decrease 10/30/2022 2035 by Irwin Brakeman, RN Outcome: Progressing 10/30/2022 1813 by Irwin Brakeman, RN Outcome: Progressing

## 2022-10-30 NOTE — Progress Notes (Signed)
PROGRESS NOTE    Tony [name]  ZOX:[id number] DOB: 10-24-1945 DOA: 10/21/2022 PCP: Tony Greathouse, MD  Chief Complaint  Patient presents with   Altered Mental Status    Brief Narrative:   77 year old with Parkinson's, cognitive impairment, left hemineglect, dyspraxia, hyperlipidemia and anxiety presented with altered mental status and weakness.  Could not walk with Tony [name] walker.  He lives in assisted living facility at [place].  Admitted with acute UTI , temperature 103, tachycardic, WBC count 18.  Found to have E. coli bacteremia. Remains in the hospital.  Waiting for SNF bed availability.  Also has significant orthostatic hypotension causing difficulty with mobility.  Assessment & Plan:   Principal Problem:   Sepsis secondary to gram negative rod bacteremia due to UTI [facility]) Active Problems:   HLD (hyperlipidemia)   Parkinson's disease   Hyponatremia   Normocytic anemia  Sepsis present on admission secondary to E. coli UTI and bacteremia.  Presented with fever, tachycardia and leukocytosis. Sepsis physiology improving. Blood cultures with E. coli no resistance.  Unfortunately urine cultures were not collected. Received Rocephin for 3 days, changing to Bactrim to complete 7 days of therapy.   Renal ultrasound did not show any hydronephrosis or urine retention.  Does have prostatomegaly.  He is on Flomax.  No significant postvoid residuals.   Orthostatic hypotension:  BP dropped to 70's with standing on 9/3 Compression stockings, abdominal binder Could be related to parkinoson's disease or flomax vs other causes Midodrine Cortisol wnl  TSH Flomax may cause orthostatic drop in blood pressure, however he has BPH.  Will continue for now. Consider echo if not improving   Chronic medical issues including Parkinson's, stable on Sinemet Hyperlipidemia, on statin   Debility deconditioning: Significant debility and deconditioning.  Recommending SNF.  Medically stable.  Peer to  peer done, he's not medically ready with orthostatic hypotension as of 9/3 (eval 9/4 pending)    DVT prophylaxis: lovenox Code Status: full Family Communication: family at bedside Disposition:   Status is: Inpatient Remains inpatient appropriate because: orthostatic hypotension   Consultants:  none  Procedures:  none  Antimicrobials:  Anti-infectives (From admission, onward)    Start     Dose/Rate Route Frequency Ordered Stop   10/24/22 1445  sulfamethoxazole-trimethoprim (BACTRIM DS) 800-160 MG per tablet 2 tablet        2 tablet Oral Every 12 hours 10/24/22 1356 10/28/22 2139   10/23/22 2000  cefTRIAXone (ROCEPHIN) 2 g in sodium chloride 0.9 % 100 mL IVPB  Status:  Discontinued       Placed in "Followed by" Linked Group   2 g 200 mL/hr over 30 Minutes Intravenous Every 24 hours 10/22/22 1909 10/22/22 2120   10/23/22 1400  cefTRIAXone (ROCEPHIN) 2 g in sodium chloride 0.9 % 100 mL IVPB  Status:  Discontinued        2 g 200 mL/hr over 30 Minutes Intravenous Every 24 hours 10/23/22 0943 10/24/22 1356   10/22/22 2200  ceFEPIme (MAXIPIME) 2 g in sodium chloride 0.9 % 100 mL IVPB  Status:  Discontinued        2 g 200 mL/hr over 30 Minutes Intravenous Every 8 hours 10/22/22 2120 10/23/22 0943   10/22/22 2000  cefTRIAXone (ROCEPHIN) 1 g in sodium chloride 0.9 % 100 mL IVPB  Status:  Discontinued       Placed in "Followed by" Linked Group   1 g 200 mL/hr over 30 Minutes Intravenous  Once 10/22/22 1909 10/22/22 2120   10/22/22 1900  ceFEPIme (MAXIPIME) 2 g in sodium chloride 0.9 % 100 mL IVPB  Status:  Discontinued        2 g 200 mL/hr over 30 Minutes Intravenous Every 8 hours 10/22/22 1732 10/22/22 1909   10/22/22 0050  cefTRIAXone (ROCEPHIN) 1 g in sodium chloride 0.9 % 100 mL IVPB  Status:  Discontinued        1 g 200 mL/hr over 30 Minutes Intravenous Every 24 hours 10/22/22 0050 10/22/22 1732   10/21/22 1930  cefTRIAXone (ROCEPHIN) 1 g in sodium chloride 0.9 % 100 mL IVPB         1 g 200 mL/hr over 30 Minutes Intravenous  Once 10/21/22 1925 10/21/22 2048       Subjective: No new complaints Eager to discharge when possible   Objective: Vitals:   10/29/22 1700 10/29/22 2124 10/30/22 0558 10/30/22 0832  BP: 114/79 132/82 (!) 154/65 115/68  Pulse: 81 86 66 63  Resp: 17 16 16 17   Temp: 98.6 F (37 C) 97.6 F (36.4 C)  97.7 F (36.5 C)  TempSrc: Oral Oral  Oral  SpO2: 98% 94% 96% 98%  Weight:      Height:        Intake/Output Summary (Last 24 hours) at 10/30/2022 1551 Last data filed at 10/30/2022 1500 Gross per 24 hour  Intake 634 ml  Output 950 ml  Net -316 ml   Filed Weights   10/21/22 1849  Weight: 88 kg    Examination:  General exam: Appears calm and comfortable  Respiratory system: unlabored Cardiovascular system: RRR Gastrointestinal system: Abdomen is nondistended, soft and nontender. Central nervous system: Alert and oriented. No focal neurological deficits. Extremities: no LEE  Data Reviewed: I have personally reviewed following labs and imaging studies  CBC: No results for input(s): "WBC", "NEUTROABS", "HGB", "HCT", "MCV", "PLT" in the last 168 hours.  Basic Metabolic Panel: No results for input(s): "NA", "K", "CL", "CO2", "GLUCOSE", "BUN", "CREATININE", "CALCIUM", "MG", "PHOS" in the last 168 hours.  GFR: Estimated Creatinine Clearance: 82.2 mL/min (by C-G formula based on SCr of 0.8 mg/dL).  Liver Function Tests: No results for input(s): "AST", "ALT", "ALKPHOS", "BILITOT", "PROT", "ALBUMIN" in the last 168 hours.  CBG: Recent Labs  Lab 10/27/22 0715 10/27/22 1138 10/27/22 1631 10/27/22 2019 10/28/22 0729  GLUCAP 84 102* 123* 115* 95     Recent Results (from the past 240 hour(s))  Resp panel by RT-PCR (RSV, Flu Tony [name]&B, Covid) Anterior Nasal Swab     Status: None   Collection Time: 10/21/22  8:17 PM   Specimen: Anterior Nasal Swab  Result Value Ref Range Status   SARS Coronavirus 2 by RT PCR NEGATIVE NEGATIVE  Final   Influenza Tony [name] by PCR NEGATIVE NEGATIVE Final   Influenza B by PCR NEGATIVE NEGATIVE Final    Comment: (NOTE) The Xpert Xpress SARS-CoV-2/FLU/RSV plus assay is intended as an aid in the diagnosis of influenza from Nasopharyngeal swab specimens and should not be used as Dreon Pineda sole basis for treatment. Nasal washings and aspirates are unacceptable for Xpert Xpress SARS-CoV-2/FLU/RSV testing.  Fact Sheet for Patients: BloggerCourse.com  Fact Sheet for Healthcare Providers: SeriousBroker.it  This test is not yet approved or cleared by the [place] FDA and has been authorized for detection and/or diagnosis of SARS-CoV-2 by FDA under an Emergency Use Authorization (EUA). This EUA will remain in effect (meaning this test can be used) for the duration of the COVID-19 declaration under Section 564(b)(1) of the Act, 21 U.S.C. section  360bbb-3(b)(1), unless the authorization is terminated or revoked.     Resp Syncytial Virus by PCR NEGATIVE NEGATIVE Final    Comment: (NOTE) Fact Sheet for Patients: BloggerCourse.com  Fact Sheet for Healthcare Providers: SeriousBroker.it  This test is not yet approved or cleared by the [place] FDA and has been authorized for detection and/or diagnosis of SARS-CoV-2 by FDA under an Emergency Use Authorization (EUA). This EUA will remain in effect (meaning this test can be used) for the duration of the COVID-19 declaration under Section 564(b)(1) of the Act, 21 U.S.C. section 360bbb-3(b)(1), unless the authorization is terminated or revoked.  Performed at [facility], [address] [place], [place] [address]   Blood Culture (routine x 2)     Status: Abnormal   Collection Time: 10/21/22  8:17 PM   Specimen: BLOOD RIGHT FOREARM  Result Value Ref Range Status   Specimen Description BLOOD RIGHT FOREARM  Final   Special Requests   Final     BOTTLES DRAWN AEROBIC AND ANAEROBIC Blood Culture results may not be optimal due to an excessive volume of blood received in culture bottles   Culture  Setup Time   Final    GRAM NEGATIVE RODS AEROBIC BOTTLE ONLY CRITICAL VALUE NOTED.  VALUE IS CONSISTENT WITH PREVIOUSLY REPORTED AND CALLED VALUE.    Culture (Ethie Curless)  Final    ESCHERICHIA COLI SUSCEPTIBILITIES PERFORMED ON PREVIOUS CULTURE WITHIN THE LAST 5 DAYS. Performed at [facility], [address] [place], [place] [address]    Report Status 10/24/2022 FINAL  Final  Blood Culture (routine x 2)     Status: Abnormal   Collection Time: 10/21/22  8:17 PM   Specimen: BLOOD LEFT HAND  Result Value Ref Range Status   Specimen Description BLOOD LEFT HAND  Final   Special Requests   Final    BOTTLES DRAWN AEROBIC ONLY Blood Culture adequate volume   Culture  Setup Time   Final    GRAM NEGATIVE RODS AEROBIC BOTTLE ONLY CRITICAL RESULT CALLED TO, READ BACK BY AND VERIFIED WITH: PHARMD SHELBY WISE ON 10/22/22 @ 1714 BYH DRT Performed at [facility], [address] [place], [place] [address]    Culture ESCHERICHIA COLI (Diala Waxman)  Final   Report Status 10/24/2022 FINAL  Final   Organism ID, Bacteria ESCHERICHIA COLI  Final      Susceptibility   Escherichia coli - MIC*    AMPICILLIN >=32 RESISTANT Resistant     CEFEPIME <=0.12 SENSITIVE Sensitive     CEFTAZIDIME <=1 SENSITIVE Sensitive     CEFTRIAXONE <=0.25 SENSITIVE Sensitive     CIPROFLOXACIN <=0.25 SENSITIVE Sensitive     GENTAMICIN <=1 SENSITIVE Sensitive     IMIPENEM <=0.25 SENSITIVE Sensitive     TRIMETH/SULFA <=20 SENSITIVE Sensitive     AMPICILLIN/SULBACTAM 16 INTERMEDIATE Intermediate     PIP/TAZO <=4 SENSITIVE Sensitive     * ESCHERICHIA COLI  Blood Culture ID Panel (Reflexed)     Status: Abnormal   Collection Time: 10/21/22  8:17 PM  Result Value Ref Range Status   Enterococcus faecalis NOT DETECTED NOT DETECTED Final   Enterococcus Faecium NOT DETECTED NOT  DETECTED Final   Listeria monocytogenes NOT DETECTED NOT DETECTED Final   Staphylococcus species NOT DETECTED NOT DETECTED Final   Staphylococcus aureus (BCID) NOT DETECTED NOT DETECTED Final   Staphylococcus epidermidis NOT DETECTED NOT DETECTED Final   Staphylococcus lugdunensis NOT DETECTED NOT DETECTED Final   Streptococcus species NOT DETECTED  NOT DETECTED Final   Streptococcus agalactiae NOT DETECTED NOT DETECTED Final   Streptococcus pneumoniae NOT DETECTED NOT DETECTED Final   Streptococcus pyogenes NOT DETECTED NOT DETECTED Final   Ebonie Westerlund.calcoaceticus-baumannii NOT DETECTED NOT DETECTED Final   Bacteroides fragilis NOT DETECTED NOT DETECTED Final   Enterobacterales DETECTED (Cylan Borum) NOT DETECTED Final    Comment: Enterobacterales represent Draven Natter large order of gram negative bacteria, not Anaria Kroner single organism. CRITICAL RESULT CALLED TO, READ BACK BY AND VERIFIED WITH: PHARMD SHELBY WISE ON 10/22/22 @ 1714 BYH DRT    Enterobacter cloacae complex NOT DETECTED NOT DETECTED Final   Escherichia coli DETECTED (Doreene Forrey) NOT DETECTED Final    Comment: CRITICAL RESULT CALLED TO, READ BACK BY AND VERIFIED WITH: PHARMD SHELBY WISE ON 10/22/22 @ 1714 BYH DRT    Klebsiella aerogenes NOT DETECTED NOT DETECTED Final   Klebsiella oxytoca NOT DETECTED NOT DETECTED Final   Klebsiella pneumoniae NOT DETECTED NOT DETECTED Final   Proteus species NOT DETECTED NOT DETECTED Final   Salmonella species NOT DETECTED NOT DETECTED Final   Serratia marcescens NOT DETECTED NOT DETECTED Final   Haemophilus influenzae NOT DETECTED NOT DETECTED Final   Neisseria meningitidis NOT DETECTED NOT DETECTED Final   Pseudomonas aeruginosa NOT DETECTED NOT DETECTED Final   Stenotrophomonas maltophilia NOT DETECTED NOT DETECTED Final   Candida albicans NOT DETECTED NOT DETECTED Final   Candida auris NOT DETECTED NOT DETECTED Final   Candida glabrata NOT DETECTED NOT DETECTED Final   Candida krusei NOT DETECTED NOT DETECTED Final   Candida  parapsilosis NOT DETECTED NOT DETECTED Final   Candida tropicalis NOT DETECTED NOT DETECTED Final   Cryptococcus neoformans/gattii NOT DETECTED NOT DETECTED Final   CTX-M ESBL NOT DETECTED NOT DETECTED Final   Carbapenem resistance IMP NOT DETECTED NOT DETECTED Final   Carbapenem resistance KPC NOT DETECTED NOT DETECTED Final   Carbapenem resistance NDM NOT DETECTED NOT DETECTED Final   Carbapenem resist OXA 48 LIKE NOT DETECTED NOT DETECTED Final   Carbapenem resistance VIM NOT DETECTED NOT DETECTED Final    Comment: Performed at [facility], [address] [place], [place] [address]         Radiology Studies: No results found.      Scheduled Meds:  atorvastatin  20 mg Oral QPM   carbidopa-levodopa  2 tablet Oral TID WC   enoxaparin (LOVENOX) injection  40 mg Subcutaneous Q24H   escitalopram  20 mg Oral Daily   loratadine  10 mg Oral Daily   midodrine  10 mg Oral TID WC   polyethylene glycol  17 g Oral BID   tamsulosin  0.4 mg Oral Daily   traZODone  100 mg Oral QHS   Continuous Infusions:   LOS: 8 days    Time spent: over 30 min    Lacretia Nicks, MD [facility]   To contact the attending provider between 7A-7P or the covering provider during after hours 7P-7A, please log into the web site [email or web address] and access using universal [place] password for that web site. If you do not have the password, please call the hospital operator.  10/30/2022, 3:51 PM

## 2022-10-30 NOTE — TOC Progression Note (Addendum)
Transition of Care [facility]) - Progression Note    Patient Details  Name: Tony [name] MRN: [id number] Date of Birth: 03-05-1945  [facility]) CM/SW Contact  Carley Hammed, LCSW Phone Number: 10/30/2022, 9:37 AM  Clinical Narrative:    CSW was advised that authorization went to P2P to determine if he is medically ready after being orthostatic yesterday. P2P with Dr. Margaretann Loveless, [phone]. ID # [id number] . To be completed by 5PM. [facility] can accept with auth.   3:50 CSW advised that pt is not medically stable to DC today, Auth will need to be restarted when medically stable.       Expected Discharge Plan and Services                                               Social Determinants of Health (SDOH) Interventions SDOH Screenings   Food Insecurity: No Food Insecurity (09/04/2022)  Housing: Low Risk  (09/04/2022)  Transportation Needs: No Transportation Needs (09/04/2022)  Utilities: Not At Risk (09/04/2022)  Tobacco Use: High Risk (10/21/2022)    Readmission Risk Interventions    10/22/2022   12:12 PM 09/05/2022   11:23 AM  Readmission Risk Prevention Plan  Post Dischage Appt  Complete  Medication Screening  Complete  Transportation Screening Complete Complete  PCP or Specialist Appt within 5-7 Days Complete   Home Care Screening Complete   Medication Review (RN CM) Referral to Pharmacy

## 2022-10-30 NOTE — Plan of Care (Signed)

## 2022-10-31 DIAGNOSIS — N390 Urinary tract infection, site not specified: Secondary | ICD-10-CM | POA: Diagnosis not present

## 2022-10-31 DIAGNOSIS — A419 Sepsis, unspecified organism: Secondary | ICD-10-CM | POA: Diagnosis not present

## 2022-10-31 LAB — COMPREHENSIVE METABOLIC PANEL
ALT: 8 U/L (ref 0–44)
AST: 14 U/L — ABNORMAL LOW (ref 15–41)
Albumin: 3.4 g/dL — ABNORMAL LOW (ref 3.5–5.0)
Alkaline Phosphatase: 75 U/L (ref 38–126)
Anion gap: 8 (ref 5–15)
BUN: 17 mg/dL (ref 8–23)
CO2: 22 mmol/L (ref 22–32)
Calcium: 8.9 mg/dL (ref 8.9–10.3)
Chloride: 102 mmol/L (ref 98–111)
Creatinine, Ser: 0.87 mg/dL (ref 0.61–1.24)
GFR, Estimated: >60 mL/min (ref >60)
Glucose, Bld: 104 mg/dL — ABNORMAL HIGH (ref 70–99)
Potassium: 3.9 mmol/L (ref 3.5–5.1)
Sodium: 132 mmol/L — ABNORMAL LOW (ref 135–145)
Total Bilirubin: 0.6 mg/dL (ref 0.3–1.2)
Total Protein: 7.2 g/dL (ref 6.5–8.1)

## 2022-10-31 LAB — CBC WITH DIFFERENTIAL/PLATELET
Abs Immature Granulocytes: 0.02 10*3/uL (ref 0.00–0.07)
Basophils Absolute: 0 10*3/uL (ref 0.0–0.1)
Basophils Relative: 1 %
Eosinophils Absolute: 0.1 10*3/uL (ref 0.0–0.5)
Eosinophils Relative: 2 %
HCT: 31.4 % — ABNORMAL LOW (ref 39.0–52.0)
Hemoglobin: 10.2 g/dL — ABNORMAL LOW (ref 13.0–17.0)
Immature Granulocytes: 0 %
Lymphocytes Relative: 25 %
Lymphs Abs: 1.3 10*3/uL (ref 0.7–4.0)
MCH: 21 pg — ABNORMAL LOW (ref 26.0–34.0)
MCHC: 32.5 g/dL (ref 30.0–36.0)
MCV: 64.6 fL — ABNORMAL LOW (ref 80.0–100.0)
Monocytes Absolute: 0.4 10*3/uL (ref 0.1–1.0)
Monocytes Relative: 7 %
Neutro Abs: 3.4 10*3/uL (ref 1.7–7.7)
Neutrophils Relative %: 65 %
Platelets: 336 10*3/uL (ref 150–400)
RBC: 4.86 MIL/uL (ref 4.22–5.81)
RDW: 15.1 % (ref 11.5–15.5)
WBC: 5.2 10*3/uL (ref 4.0–10.5)
nRBC: 0 % (ref 0.0–0.2)

## 2022-10-31 LAB — PHOSPHORUS: Phosphorus: 3.1 mg/dL (ref 2.5–4.6)

## 2022-10-31 LAB — MAGNESIUM: Magnesium: 1.5 mg/dL — ABNORMAL LOW (ref 1.7–2.4)

## 2022-10-31 LAB — TSH: TSH: 2.259 u[IU]/mL (ref 0.350–4.500)

## 2022-10-31 MED ORDER — MAGNESIUM SULFATE 4 GM/100ML IV SOLN
4.0000 g | Freq: Once | INTRAVENOUS | Status: AC
  Administered 2022-10-31: 4 g via INTRAVENOUS
  Filled 2022-10-31: qty 100

## 2022-10-31 NOTE — Progress Notes (Signed)
Occupational Therapy Treatment Patient Details Name: Tony [name] MRN: [id number] DOB: 03-03-1945 Today's Date: 10/31/2022   History of present illness 77 y.o. male who presented 10/21/22 with altered mental status and weakness. 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   OT comments  Pt continues to be orthostatic during sessions, he has a challenge with sequencing prompts and his L inattention continues to persist but needs tactile and verbal cues to help correct. Pt displays a strong posterior lean in standing and sitting, his BP continues to fluctuate despite being donned with compressive garments. Educated pt on PWR moves, attempted them but pt was only successful with 2 due to poor processing. OT to continue to progress pt as able, DC plans remain appropriate for SNF.   Orthostatic BPs  Supine 103/71  Sitting with binder and compression socks 138/81   Sitting after 0 min 138/81   Standing 68/54  Return to supine 113/108         If plan is discharge home, recommend the following:  A little help with walking and/or transfers;A lot of help with bathing/dressing/bathroom;Assistance with cooking/housework;Supervision due to cognitive status;Direct supervision/assist for financial management;Assist for transportation;Help with stairs or ramp for entrance;Direct supervision/assist for medications management   Equipment Recommendations  Other (comment) (defer)    Recommendations for Other Services      Precautions / Restrictions Precautions Precautions: Fall Precaution Comments: orthostatic hypotension, L hemineglect, freezing episodes Required Braces or Orthoses: Other Brace (bilat compression socks) Other Brace: abdominal binder Restrictions Weight Bearing Restrictions: No       Mobility Bed Mobility Overal bed mobility: Needs  Assistance Bed Mobility: Supine to Sit, Sit to Supine     Supine to sit: Mod assist, HOB elevated, Used rails Sit to supine: Mod assist        Transfers Overall transfer level: Needs assistance Equipment used: Rolling walker (2 wheels) Transfers: Sit to/from Stand, Bed to chair/wheelchair/BSC Sit to Stand: From elevated surface, Mod assist, Max assist Stand pivot transfers: Max assist         General transfer comment: STS x2 from EOB, pt not producing any steps with LLE to assist in pivot transfer     Balance Overall balance assessment: Needs assistance Sitting-balance support: Feet supported, Single extremity supported Sitting balance-Leahy Scale: Fair Sitting balance - Comments: occaisional posterior lean with fatigue Postural control: Posterior lean Standing balance support: Bilateral upper extremity supported, Reliant on assistive device for balance, During functional activity Standing balance-Leahy Scale: Poor Standing balance comment: Static standing min A to mod A that gets progressively worse the longer he stands                           ADL either performed or assessed with clinical judgement   ADL Overall ADL's : Needs assistance/impaired                                       General ADL Comments: Donned with abdominal binder and bilat compression socks sitting EOB, performed dyanmic reaching and PWR twist and UP to encourage fluid vestibular movements in preparation for balance and standing. Orthostatics assessed    Extremity/Trunk Assessment              Vision  Perception     Praxis      Cognition Arousal: Alert Behavior During Therapy: Flat affect Overall Cognitive Status: Impaired/Different from baseline Area of Impairment: Following commands, Safety/judgement, Awareness, Problem solving, Memory, Attention                   Current Attention Level: Focused Memory: Decreased short-term  memory Following Commands: Follows one step commands with increased time, Follows one step commands inconsistently Safety/Judgement: Decreased awareness of safety, Decreased awareness of deficits Awareness: Intellectual Problem Solving: Slow processing, Decreased initiation, Difficulty sequencing, Requires verbal cues, Requires tactile cues General Comments: serveral repetitive cues needed to engage in dynamic reaching, not able to process how to lean forward, attempts to push off and stand with attempts at just leaning forward        Exercises      Shoulder Instructions       General Comments Check orthostatic BP readings from chart, Pt c/o dizziness in standing with compressive devices donned. Pt BP also displaying various results from vital assessment with OT than with RN    Pertinent Vitals/ Pain       Pain Assessment Pain Assessment: No/denies pain  Home Living                                          Prior Functioning/Environment              Frequency  Min 1X/week        Progress Toward Goals  OT Goals(current goals can now be found in the care plan section)  Progress towards OT goals: Not progressing toward goals - comment (orthostatics affecting mobility)  Acute Rehab OT Goals Patient Stated Goal: get as independent as possible OT Goal Formulation: With patient/family Time For Goal Achievement: 11/06/22 Potential to Achieve Goals: Good  Plan      Co-evaluation                 AM-PAC OT "6 Clicks" Daily Activity     Outcome Measure   Help from another person eating meals?: A Little Help from another person taking care of personal grooming?: A Little Help from another person toileting, which includes using toliet, bedpan, or urinal?: A Lot Help from another person bathing (including washing, rinsing, drying)?: A Lot Help from another person to put on and taking off regular upper body clothing?: A Little Help from another  person to put on and taking off regular lower body clothing?: A Lot 6 Click Score: 15    End of Session Equipment Utilized During Treatment: Gait belt;Rolling walker (2 wheels)  OT Visit Diagnosis: Unsteadiness on feet (R26.81);Other abnormalities of gait and mobility (R26.89);History of falling (Z91.81);Muscle weakness (generalized) (M62.81)   Activity Tolerance Treatment limited secondary to medical complications (Comment) (low BP)   Patient Left with call bell/phone within reach;with family/visitor present;in chair;with chair alarm set   Nurse Communication Mobility status;Other (comment) (orthostatic)        Time: 1191-4782 OT Time Calculation (min): 44 min  Charges: OT General Charges $OT Visit: 1 Visit OT Treatments $Therapeutic Activity: 38-52 mins  10/31/2022  AB, OTR/L  Acute Rehabilitation Services  Office: [phone]   Tony [name] 10/31/2022, 12:53 PM

## 2022-10-31 NOTE — Plan of Care (Signed)
  Problem: Clinical Measurements: Goal: Respiratory complications will improve Outcome: Progressing Goal: Cardiovascular complication will be avoided Outcome: Progressing   Problem: Elimination: Goal: Will not experience complications related to urinary retention Outcome: Progressing   

## 2022-10-31 NOTE — TOC Progression Note (Signed)
Transition of Care [facility]) - Progression Note    Patient Details  Name: Tony [name] MRN: [id number] Date of Birth: Feb 12, 1946  Transition of Care [name]) CM/SW Contact  Erin Sons, [place] Phone Number: 10/31/2022, 10:43 AM  Clinical Narrative:     CSW attempted to restart insurance auth though CSW is informed that this cannot be done until previous [facility] is closed. Previous [facility] was denied due to pt not being stable for DC and is in process of being closed.  Expected Discharge Plan: Skilled Nursing Facility Barriers to Discharge: Continued Medical Work up, English as a second language teacher  Expected Discharge Plan and Services                                               Social Determinants of Health (SDOH) Interventions SDOH Screenings   Food Insecurity: No Food Insecurity (09/04/2022)  Housing: Low Risk  (09/04/2022)  Transportation Needs: No Transportation Needs (09/04/2022)  Utilities: Not At Risk (09/04/2022)  Tobacco Use: High Risk (10/21/2022)    Readmission Risk Interventions    10/22/2022   12:12 PM 09/05/2022   11:23 AM  Readmission Risk Prevention Plan  Post Dischage Appt  Complete  Medication Screening  Complete  Transportation Screening Complete Complete  PCP or Specialist Appt within 5-7 Days Complete   Home Care Screening Complete   Medication Review (RN CM) Referral to Pharmacy

## 2022-10-31 NOTE — Progress Notes (Signed)
PROGRESS NOTE    Tony [name]  GNF:[id number] DOB: [birth year] DOA: 10/21/2022 PCP: Tony Greathouse, MD  Chief Complaint  Patient presents with   Altered Mental Status    Brief Narrative:   77 year old with Parkinson's, cognitive impairment, left hemineglect, dyspraxia, hyperlipidemia and anxiety presented with altered mental status and weakness.  Could not walk with Tony [name] walker.  He lives in assisted living facility at [facility].  Admitted with acute UTI , temperature 103, tachycardic, WBC count 18.  Found to have E. coli bacteremia. Remains in the hospital.  Waiting for SNF bed availability.  Also has significant orthostatic hypotension causing difficulty with mobility.  Assessment & Plan:   Principal Problem:   Sepsis secondary to gram negative rod bacteremia due to UTI [facility]) Active Problems:   HLD (hyperlipidemia)   Parkinson's disease   Hyponatremia   Normocytic anemia  Sepsis present on admission secondary to E. coli UTI and bacteremia.  Presented with fever, tachycardia and leukocytosis. Sepsis physiology improving. Blood cultures with E. coli no resistance.  Unfortunately urine cultures were not collected. Completed course of antibiotics Renal ultrasound did not show any hydronephrosis or urine retention.  Does have prostatomegaly.  He is on Flomax.  No significant postvoid residuals.   Orthostatic hypotension:  Remains orthostatic 9/4, though improved with SBP in 100's with standing Follow repeat orthostatics today Compression stockings, abdominal binder Could be related to parkinoson's disease or flomax vs other causes Midodrine Cortisol wnl  TSH (labs pending) Flomax may cause orthostatic drop in blood pressure, however he has BPH.  Will continue for now. Consider echo if not improving   Chronic medical issues including Parkinson's, stable on Sinemet Hyperlipidemia, on statin   Debility deconditioning: Significant debility and deconditioning.  Recommending SNF.   Medically stable.  Peer to peer done, he's not medically ready with orthostatic hypotension as of 9/3 (eval 9/4 pending)    DVT prophylaxis: lovenox Code Status: full Family Communication: family at bedside Disposition:   Status is: Inpatient Remains inpatient appropriate because: orthostatic hypotension   Consultants:  none  Procedures:  none  Antimicrobials:  Anti-infectives (From admission, onward)    Start     Dose/Rate Route Frequency Ordered Stop   10/24/22 1445  sulfamethoxazole-trimethoprim (BACTRIM DS) 800-160 MG per tablet 2 tablet        2 tablet Oral Every 12 hours 10/24/22 1356 10/28/22 2139   10/23/22 2000  cefTRIAXone (ROCEPHIN) 2 g in sodium chloride 0.9 % 100 mL IVPB  Status:  Discontinued       Placed in "Followed by" Linked Group   2 g 200 mL/hr over 30 Minutes Intravenous Every 24 hours 10/22/22 1909 10/22/22 2120   10/23/22 1400  cefTRIAXone (ROCEPHIN) 2 g in sodium chloride 0.9 % 100 mL IVPB  Status:  Discontinued        2 g 200 mL/hr over 30 Minutes Intravenous Every 24 hours 10/23/22 0943 10/24/22 1356   10/22/22 2200  ceFEPIme (MAXIPIME) 2 g in sodium chloride 0.9 % 100 mL IVPB  Status:  Discontinued        2 g 200 mL/hr over 30 Minutes Intravenous Every 8 hours 10/22/22 2120 10/23/22 0943   10/22/22 2000  cefTRIAXone (ROCEPHIN) 1 g in sodium chloride 0.9 % 100 mL IVPB  Status:  Discontinued       Placed in "Followed by" Linked Group   1 g 200 mL/hr over 30 Minutes Intravenous  Once 10/22/22 1909 10/22/22 2120   10/22/22 1900  ceFEPIme (MAXIPIME) 2  g in sodium chloride 0.9 % 100 mL IVPB  Status:  Discontinued        2 g 200 mL/hr over 30 Minutes Intravenous Every 8 hours 10/22/22 1732 10/22/22 1909   10/22/22 0050  cefTRIAXone (ROCEPHIN) 1 g in sodium chloride 0.9 % 100 mL IVPB  Status:  Discontinued        1 g 200 mL/hr over 30 Minutes Intravenous Every 24 hours 10/22/22 0050 10/22/22 1732   10/21/22 1930  cefTRIAXone (ROCEPHIN) 1 g in sodium  chloride 0.9 % 100 mL IVPB        1 g 200 mL/hr over 30 Minutes Intravenous  Once 10/21/22 1925 10/21/22 2048       Subjective: No new complaints Eager to discharge when possible   Objective: Vitals:   10/30/22 1722 10/30/22 2100 10/31/22 0605 10/31/22 0730  BP: (!) 150/88 123/85 115/71 118/77  Pulse: 99 93 70 73  Resp: 18 20 20 18   Temp: 97.9 F (36.6 C) 98.7 F (37.1 C) 98.6 F (37 C) 98.6 F (37 C)  TempSrc:    Oral  SpO2: 98% 91% 96% 98%  Weight:      Height:        Intake/Output Summary (Last 24 hours) at 10/31/2022 1022 Last data filed at 10/31/2022 0703 Gross per 24 hour  Intake 360 ml  Output 1275 ml  Net -915 ml   Filed Weights   10/21/22 1849  Weight: 88 kg    Examination:  General: No acute distress. Lungs: unlabored Neurological: Alert and oriented 3. Moves all extremities 4. Cranial nerves II through XII grossly intact. Extremities: No clubbing or cyanosis. No edema.  Data Reviewed: I have personally reviewed following labs and imaging studies  CBC: No results for input(s): "WBC", "NEUTROABS", "HGB", "HCT", "MCV", "PLT" in the last 168 hours.  Basic Metabolic Panel: No results for input(s): "NA", "K", "CL", "CO2", "GLUCOSE", "BUN", "CREATININE", "CALCIUM", "MG", "PHOS" in the last 168 hours.  GFR: Estimated Creatinine Clearance: 82.2 mL/min (by C-G formula based on SCr of 0.8 mg/dL).  Liver Function Tests: No results for input(s): "AST", "ALT", "ALKPHOS", "BILITOT", "PROT", "ALBUMIN" in the last 168 hours.  CBG: Recent Labs  Lab 10/27/22 0715 10/27/22 1138 10/27/22 1631 10/27/22 2019 10/28/22 0729  GLUCAP 84 102* 123* 115* 95     Recent Results (from the past 240 hour(s))  Resp panel by RT-PCR (RSV, Flu Tony [name]&B, Covid) Anterior Nasal Swab     Status: None   Collection Time: 10/21/22  8:17 PM   Specimen: Anterior Nasal Swab  Result Value Ref Range Status   SARS Coronavirus 2 by RT PCR NEGATIVE NEGATIVE Final   Influenza Tony [name] by PCR  NEGATIVE NEGATIVE Final   Influenza B by PCR NEGATIVE NEGATIVE Final    Comment: (NOTE) The Xpert Xpress SARS-CoV-2/FLU/RSV plus assay is intended as an aid in the diagnosis of influenza from Nasopharyngeal swab specimens and should not be used as Valina Maes sole basis for treatment. Nasal washings and aspirates are unacceptable for Xpert Xpress SARS-CoV-2/FLU/RSV testing.  Fact Sheet for Patients: BloggerCourse.com  Fact Sheet for Healthcare Providers: SeriousBroker.it  This test is not yet approved or cleared by the [place] FDA and has been authorized for detection and/or diagnosis of SARS-CoV-2 by FDA under an Emergency Use Authorization (EUA). This EUA will remain in effect (meaning this test can be used) for the duration of the COVID-19 declaration under Section 564(b)(1) of the Act, 21 U.S.C. section 360bbb-3(b)(1), unless the authorization is  terminated or revoked.     Resp Syncytial Virus by PCR NEGATIVE NEGATIVE Final    Comment: (NOTE) Fact Sheet for Patients: BloggerCourse.com  Fact Sheet for Healthcare Providers: SeriousBroker.it  This test is not yet approved or cleared by the [place] FDA and has been authorized for detection and/or diagnosis of SARS-CoV-2 by FDA under an Emergency Use Authorization (EUA). This EUA will remain in effect (meaning this test can be used) for the duration of the COVID-19 declaration under Section 564(b)(1) of the Act, 21 U.S.C. section 360bbb-3(b)(1), unless the authorization is terminated or revoked.  Performed at [facility], [address] [place], [place] [address]   Blood Culture (routine x 2)     Status: Abnormal   Collection Time: 10/21/22  8:17 PM   Specimen: BLOOD RIGHT FOREARM  Result Value Ref Range Status   Specimen Description BLOOD RIGHT FOREARM  Final   Special Requests   Final    BOTTLES DRAWN AEROBIC  AND ANAEROBIC Blood Culture results may not be optimal due to an excessive volume of blood received in culture bottles   Culture  Setup Time   Final    GRAM NEGATIVE RODS AEROBIC BOTTLE ONLY CRITICAL VALUE NOTED.  VALUE IS CONSISTENT WITH PREVIOUSLY REPORTED AND CALLED VALUE.    Culture (Shonda Mandarino)  Final    ESCHERICHIA COLI SUSCEPTIBILITIES PERFORMED ON PREVIOUS CULTURE WITHIN THE LAST 5 DAYS. Performed at [facility], [address] [place], [place] [address]    Report Status 10/24/2022 FINAL  Final  Blood Culture (routine x 2)     Status: Abnormal   Collection Time: 10/21/22  8:17 PM   Specimen: BLOOD LEFT HAND  Result Value Ref Range Status   Specimen Description BLOOD LEFT HAND  Final   Special Requests   Final    BOTTLES DRAWN AEROBIC ONLY Blood Culture adequate volume   Culture  Setup Time   Final    GRAM NEGATIVE RODS AEROBIC BOTTLE ONLY CRITICAL RESULT CALLED TO, READ BACK BY AND VERIFIED WITH: PHARMD SHELBY WISE ON 10/22/22 @ 1714 BYH DRT Performed at [facility], [address], [place], [place] [address]    Culture ESCHERICHIA COLI (Milus Fritze)  Final   Report Status 10/24/2022 FINAL  Final   Organism ID, Bacteria ESCHERICHIA COLI  Final      Susceptibility   Escherichia coli - MIC*    AMPICILLIN >=32 RESISTANT Resistant     CEFEPIME <=0.12 SENSITIVE Sensitive     CEFTAZIDIME <=1 SENSITIVE Sensitive     CEFTRIAXONE <=0.25 SENSITIVE Sensitive     CIPROFLOXACIN <=0.25 SENSITIVE Sensitive     GENTAMICIN <=1 SENSITIVE Sensitive     IMIPENEM <=0.25 SENSITIVE Sensitive     TRIMETH/SULFA <=20 SENSITIVE Sensitive     AMPICILLIN/SULBACTAM 16 INTERMEDIATE Intermediate     PIP/TAZO <=4 SENSITIVE Sensitive     * ESCHERICHIA COLI  Blood Culture ID Panel (Reflexed)     Status: Abnormal   Collection Time: 10/21/22  8:17 PM  Result Value Ref Range Status   Enterococcus faecalis NOT DETECTED NOT DETECTED Final   Enterococcus Faecium NOT DETECTED NOT DETECTED Final   Listeria  monocytogenes NOT DETECTED NOT DETECTED Final   Staphylococcus species NOT DETECTED NOT DETECTED Final   Staphylococcus aureus (BCID) NOT DETECTED NOT DETECTED Final   Staphylococcus epidermidis NOT DETECTED NOT DETECTED Final   Staphylococcus lugdunensis NOT DETECTED NOT DETECTED Final   Streptococcus species NOT DETECTED NOT DETECTED Final  Streptococcus agalactiae NOT DETECTED NOT DETECTED Final   Streptococcus pneumoniae NOT DETECTED NOT DETECTED Final   Streptococcus pyogenes NOT DETECTED NOT DETECTED Final   Roniesha Hollingshead.calcoaceticus-baumannii NOT DETECTED NOT DETECTED Final   Bacteroides fragilis NOT DETECTED NOT DETECTED Final   Enterobacterales DETECTED (Fatimata Talsma) NOT DETECTED Final    Comment: Enterobacterales represent Cena Bruhn large order of gram negative bacteria, not Tanav Orsak single organism. CRITICAL RESULT CALLED TO, READ BACK BY AND VERIFIED WITH: PHARMD SHELBY WISE ON 10/22/22 @ 1714 BYH DRT    Enterobacter cloacae complex NOT DETECTED NOT DETECTED Final   Escherichia coli DETECTED (Ahmoni Edge) NOT DETECTED Final    Comment: CRITICAL RESULT CALLED TO, READ BACK BY AND VERIFIED WITH: PHARMD SHELBY WISE ON 10/22/22 @ 1714 BYH DRT    Klebsiella aerogenes NOT DETECTED NOT DETECTED Final   Klebsiella oxytoca NOT DETECTED NOT DETECTED Final   Klebsiella pneumoniae NOT DETECTED NOT DETECTED Final   Proteus species NOT DETECTED NOT DETECTED Final   Salmonella species NOT DETECTED NOT DETECTED Final   Serratia marcescens NOT DETECTED NOT DETECTED Final   Haemophilus influenzae NOT DETECTED NOT DETECTED Final   Neisseria meningitidis NOT DETECTED NOT DETECTED Final   Pseudomonas aeruginosa NOT DETECTED NOT DETECTED Final   Stenotrophomonas maltophilia NOT DETECTED NOT DETECTED Final   Candida albicans NOT DETECTED NOT DETECTED Final   Candida auris NOT DETECTED NOT DETECTED Final   Candida glabrata NOT DETECTED NOT DETECTED Final   Candida krusei NOT DETECTED NOT DETECTED Final   Candida parapsilosis NOT DETECTED  NOT DETECTED Final   Candida tropicalis NOT DETECTED NOT DETECTED Final   Cryptococcus neoformans/gattii NOT DETECTED NOT DETECTED Final   CTX-M ESBL NOT DETECTED NOT DETECTED Final   Carbapenem resistance IMP NOT DETECTED NOT DETECTED Final   Carbapenem resistance KPC NOT DETECTED NOT DETECTED Final   Carbapenem resistance NDM NOT DETECTED NOT DETECTED Final   Carbapenem resist OXA 48 LIKE NOT DETECTED NOT DETECTED Final   Carbapenem resistance VIM NOT DETECTED NOT DETECTED Final    Comment: Performed at [facility], [address], [place], [place] [address]         Radiology Studies: No results found.      Scheduled Meds:  atorvastatin  20 mg Oral QPM   carbidopa-levodopa  2 tablet Oral TID WC   enoxaparin (LOVENOX) injection  40 mg Subcutaneous Q24H   escitalopram  20 mg Oral Daily   loratadine  10 mg Oral Daily   midodrine  10 mg Oral TID WC   polyethylene glycol  17 g Oral BID   tamsulosin  0.4 mg Oral Daily   traZODone  100 mg Oral QHS   Continuous Infusions:   LOS: 9 days    Time spent: over 30 min    Lacretia Nicks, MD [facility]   To contact the attending provider between 7A-7P or the covering provider during after hours 7P-7A, please log into the web site [email or web address] and access using universal [place] password for that web site. If you do not have the password, please call the hospital operator.  10/31/2022, 10:22 AM

## 2022-11-01 ENCOUNTER — Inpatient Hospital Stay (HOSPITAL_COMMUNITY): Payer: No Typology Code available for payment source

## 2022-11-01 DIAGNOSIS — R008 Other abnormalities of heart beat: Secondary | ICD-10-CM | POA: Diagnosis not present

## 2022-11-01 DIAGNOSIS — A419 Sepsis, unspecified organism: Secondary | ICD-10-CM | POA: Diagnosis not present

## 2022-11-01 DIAGNOSIS — N390 Urinary tract infection, site not specified: Secondary | ICD-10-CM | POA: Diagnosis not present

## 2022-11-01 LAB — CBC WITH DIFFERENTIAL/PLATELET
Abs Immature Granulocytes: 0.02 10*3/uL (ref 0.00–0.07)
Basophils Absolute: 0 10*3/uL (ref 0.0–0.1)
Basophils Relative: 1 %
Eosinophils Absolute: 0.2 10*3/uL (ref 0.0–0.5)
Eosinophils Relative: 3 %
HCT: 33.3 % — ABNORMAL LOW (ref 39.0–52.0)
Hemoglobin: 10.7 g/dL — ABNORMAL LOW (ref 13.0–17.0)
Immature Granulocytes: 0 %
Lymphocytes Relative: 28 %
Lymphs Abs: 1.5 10*3/uL (ref 0.7–4.0)
MCH: 21.1 pg — ABNORMAL LOW (ref 26.0–34.0)
MCHC: 32.1 g/dL (ref 30.0–36.0)
MCV: 65.7 fL — ABNORMAL LOW (ref 80.0–100.0)
Monocytes Absolute: 0.4 10*3/uL (ref 0.1–1.0)
Monocytes Relative: 8 %
Neutro Abs: 3.1 10*3/uL (ref 1.7–7.7)
Neutrophils Relative %: 60 %
Platelets: 292 10*3/uL (ref 150–400)
RBC: 5.07 MIL/uL (ref 4.22–5.81)
RDW: 15.2 % (ref 11.5–15.5)
WBC: 5.3 10*3/uL (ref 4.0–10.5)
nRBC: 0 % (ref 0.0–0.2)

## 2022-11-01 LAB — COMPREHENSIVE METABOLIC PANEL
ALT: 24 U/L (ref 0–44)
AST: 13 U/L — ABNORMAL LOW (ref 15–41)
Albumin: 3.4 g/dL — ABNORMAL LOW (ref 3.5–5.0)
Alkaline Phosphatase: 77 U/L (ref 38–126)
Anion gap: 9 (ref 5–15)
BUN: 16 mg/dL (ref 8–23)
CO2: 23 mmol/L (ref 22–32)
Calcium: 9.3 mg/dL (ref 8.9–10.3)
Chloride: 101 mmol/L (ref 98–111)
Creatinine, Ser: 0.89 mg/dL (ref 0.61–1.24)
GFR, Estimated: >60 mL/min (ref >60)
Glucose, Bld: 91 mg/dL (ref 70–99)
Potassium: 4.2 mmol/L (ref 3.5–5.1)
Sodium: 133 mmol/L — ABNORMAL LOW (ref 135–145)
Total Bilirubin: 0.7 mg/dL (ref 0.3–1.2)
Total Protein: 7.2 g/dL (ref 6.5–8.1)

## 2022-11-01 LAB — ECHOCARDIOGRAM COMPLETE
Area-P 1/2: 3.63 cm2
Height: 72 in
S' Lateral: 3.1 cm
Weight: 3104.08 [oz_av]

## 2022-11-01 LAB — MAGNESIUM: Magnesium: 2.1 mg/dL (ref 1.7–2.4)

## 2022-11-01 LAB — PHOSPHORUS: Phosphorus: 3.5 mg/dL (ref 2.5–4.6)

## 2022-11-01 NOTE — Progress Notes (Signed)
This chaplain responded to MD consult for spiritual care in the setting of family and Pt. support with an unclear discharge plan. The Pt. shares his preferred name is [name].  The chaplain reviewed the chart notes and entered the Pt. room with an accepted invitation to visit with the chaplain. The Pt. immediately began what the Pt. described as "disorientation". The Pt. shares the disorientation began today with his daughter's departure from the bedside, OT visit, and leaving the room for the Echocardiogram.   The chaplain listened reflectively as the Pt. described the changes in his life, not knowing where he was, [place] War, and scary dreams. The chaplain witnessed the Pt. trying to sit up in bed and his unsuccessful attempts to move his pillow. The Pt. accepted the chaplain's invitation to hold his hand and participate in guided breathing to help him relax. The Pt. was able to calm himself down and share his children's names and the recent SNF which he really likes. The Pt. ended the visit relaxed in the bed and choosing to watch sports on TV.  This chaplain is available for F/U spiritual care as needed and will update the RN.  Chaplain Stephanie Acre [phone]

## 2022-11-01 NOTE — Progress Notes (Signed)
     Referral received for Tony [name]: goals of care discussion. Chart reviewed and updates received from RN.   I was able to speak with the patient's wife Tony [name]. GOC meeting scheduled for 11/02/2022 @ 0930. Family is aware we will meet at patient's bedside.   Detailed note and recommendations to follow once GOC has been completed.   Thank you for your referral and allowing PMT to assist in [facility] care.   Sarina Ser, NP Palliative Medicine Team  Team Phone # [phone]   NO CHARGE

## 2022-11-01 NOTE — Plan of Care (Signed)

## 2022-11-01 NOTE — Progress Notes (Signed)
Physical Therapy Treatment Patient Details Name: Tony [name] MRN: [id number] DOB: 1945/09/14 Today's Date: 11/01/2022   History of Present Illness 77 y.o. male who presented 10/21/22 with altered mental status and weakness. 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    PT Comments  Pt received in recliner, agreeable to therapy session and with good participation and tolerance for transfer training and pivotal steps from chair>bed. Had planned to work more on standing/gait progression however transport tech arrived during session to take him to Echo Dept for test. Pt needing light ModA +1 for sit>stand from recliner and up to +2 modA for bed mobility/transfers for safety. Pt c/o fatigue standing during transfer, abdominal binder and knee-high compression socks on throughout. Could trial thigh-high compression to see if this helps with standing tolerance. Pt continues to benefit from PT services to progress toward functional mobility goals.     If plan is discharge home, recommend the following: Two people to help with walking and/or transfers;Assistance with feeding;Direct supervision/assist for medications management;Direct supervision/assist for financial management;Assist for transportation;Supervision due to cognitive status   Can travel by private vehicle     No  Equipment Recommendations  None recommended by PT;Other (comment) (TBD, likely manual wheelchair with pressure relief cushion if he does not have one)    Recommendations for Other Services       Precautions / Restrictions Precautions Precautions: Fall Precaution Comments: orthostatic hypotension, L hemineglect, freezing episodes Required Braces or Orthoses: Other Brace (bilat compression socks) Other Brace: abdominal binder Restrictions Weight Bearing Restrictions: No     Mobility  Bed  Mobility Overal bed mobility: Needs Assistance Bed Mobility: Sit to Supine       Sit to supine: Mod assist, +2 for physical assistance   General bed mobility comments: Dense multimodal cues to initiate and perform return to supine, pt lying toward his L side which is more challenging for him given L hemineglect. Trunk and BLE assist needed, HOB flat.    Transfers Overall transfer level: Needs assistance Equipment used: Rolling walker (2 wheels) Transfers: Sit to/from Stand, Bed to chair/wheelchair/BSC Sit to Stand: Mod assist   Step pivot transfers: Mod assist, +2 safety/equipment       General transfer comment: STS x2 from EOB, pt not producing any steps with LLE to assist in pivot transfer    Ambulation/Gait Ambulation/Gait assistance: Mod assist, +2 safety/equipment Gait Distance (Feet): 4 Feet Assistive device: Rolling walker (2 wheels) Gait Pattern/deviations: Step-to pattern, Decreased stride length, Trunk flexed, Narrow base of support, Shuffle, Decreased step length - left       General Gait Details: Chair>bed toward his R, pt initiating well with RLE stepping with cues, pt freezes halfway through transfer and not initiating LLE steps, needs manual assist to step toward his L side and backward with LLE. Pt c/o fatigue, unable to assess standing BP due to pt urgency to sit down. Transport tech waiting to take pt to ECHO, attempted to check seated BP once at EOB but no result after initial cycle on cuff so pt returned to supine and left in care of transport for test.   Stairs             Wheelchair Mobility     Tilt Bed    Modified Rankin (Stroke Patients Only)       Balance Overall balance assessment: Needs assistance Sitting-balance support: Feet supported, Single  extremity supported Sitting balance-Leahy Scale: Fair Sitting balance - Comments: Fair to Poor as he fatigued Postural control: Posterior lean (at EOB with fatigue) Standing balance  support: Bilateral upper extremity supported, Reliant on assistive device for balance, During functional activity Standing balance-Leahy Scale: Poor Standing balance comment: reliant on RW and external assist +2 for safety                            Cognition Arousal: Alert Behavior During Therapy: Flat affect Overall Cognitive Status: Impaired/Different from baseline Area of Impairment: Following commands, Safety/judgement, Awareness, Problem solving, Memory, Attention                   Current Attention Level: Focused Memory: Decreased short-term memory Following Commands: Follows one step commands with increased time, Follows one step commands inconsistently Safety/Judgement: Decreased awareness of safety, Decreased awareness of deficits Awareness: Intellectual Problem Solving: Slow processing, Decreased initiation, Difficulty sequencing, Requires verbal cues, Requires tactile cues General Comments: improved processing and command follow while seated, pt with difficulty hearing/responding to commands while standing, possibly due to BP (unable to assess due to pt need to sit/fatigue)        Exercises      General Comments General comments (skin integrity, edema, etc.): BP 95/73 sitting in recliner prior to standing, before abd binder and compresssion socks placed (he had them off for bath). HR 90 bpm sitting in chair and 99% SpO2 on RA.      Pertinent Vitals/Pain Pain Assessment Pain Assessment: No/denies pain Breathing: normal Negative Vocalization: none Facial Expression: facial grimacing Body Language: tense, distressed pacing, fidgeting Consolability: no need to console PAINAD Score: 3 Pain Location: not localized Pain Intervention(s): Monitored during session, Repositioned     PT Goals (current goals can now be found in the care plan section) Acute Rehab PT Goals Patient Stated Goal: unable to state; wife hopes he will become ambulatory enough to  return to [place] house PT Goal Formulation: With family Time For Goal Achievement: 11/05/22 Progress towards PT goals: Progressing toward goals    Frequency    Min 1X/week      PT Plan         AM-PAC PT "6 Clicks" Mobility   Outcome Measure  Help needed turning from your back to your side while in a flat bed without using bedrails?: A Lot Help needed moving from lying on your back to sitting on the side of a flat bed without using bedrails?: A Lot Help needed moving to and from a bed to a chair (including a wheelchair)?: Total Help needed standing up from a chair using your arms (e.g., wheelchair or bedside chair)?: A Lot Help needed to walk in hospital room?: Total Help needed climbing 3-5 steps with a railing? : Total 6 Click Score: 9    End of Session Equipment Utilized During Treatment: Gait belt;Other (comment) (abd binder, compression socks donned for transfer, binder doffed once back in supine) Activity Tolerance: Patient tolerated treatment well;Treatment limited secondary to medical complications (Comment);Other (comment) (transport tech arriving to room to take him to Echo Lab) Patient left: in bed;with call bell/phone within reach;Other (comment) (in care of transport tech) Nurse Communication: Mobility status;Other (comment) (needs abd binder and compression socks on for any OOB/standing activity) PT Visit Diagnosis: Unsteadiness on feet (R26.81);Other abnormalities of gait and mobility (R26.89);History of falling (Z91.81);Muscle weakness (generalized) (M62.81)     Time: 4696-2952 PT Time Calculation (min) (ACUTE ONLY):  20 min  Charges:    $Therapeutic Activity: 8-22 mins PT General Charges $$ ACUTE PT VISIT: 1 Visit                     Dianelly Ferran P., PTA Acute Rehabilitation Services Secure Chat Preferred 9a-5:30pm Office: [phone]    Dorathy Kinsman [facility] 11/01/2022, 11:58 AM

## 2022-11-01 NOTE — Progress Notes (Signed)
PROGRESS NOTE    Tony [name]  WUJ:[id number] DOB: 12-27-1945 DOA: 10/21/2022 PCP: Chilton Greathouse, MD  Chief Complaint  Patient presents with   Altered Mental Status    Brief Narrative:   77 year old with Parkinson's, cognitive impairment, left hemineglect, dyspraxia, hyperlipidemia and anxiety presented with altered mental status and weakness.  Could not walk with Shantera Monts walker.  He lives in assisted living facility at [place].  Admitted with acute UTI , temperature 103, tachycardic, WBC count 18.  Found to have E. coli bacteremia. Remains in the hospital.  Waiting for SNF bed availability.  Also has significant orthostatic hypotension causing difficulty with mobility.  Assessment & Plan:   Principal Problem:   Sepsis secondary to gram negative rod bacteremia due to UTI [facility]) Active Problems:   HLD (hyperlipidemia)   Parkinson's disease   Hyponatremia   Normocytic anemia  Sepsis present on admission secondary to E. coli UTI and bacteremia.  Presented with fever, tachycardia and leukocytosis. Sepsis physiology improving. Blood cultures with E. coli no resistance.  Unfortunately urine cultures were not collected. Completed course of antibiotics Renal ultrasound did not show any hydronephrosis or urine retention.  Does have prostatomegaly.  He is on Flomax.  No significant postvoid residuals.   Orthostatic hypotension:  Remains orthostatic 9/5 Follow repeat orthostatics today Thigh high compression stockings, abdominal binder Could be related to parkinoson's disease or flomax vs other causes Midodrine Cortisol wnl  TSH wnl Flomax is on hold Echo pending If continued orthostasis today, will try to get tilt bed  BPH Holding flomax, follow bladder scans   Chronic medical issues including Parkinson's, stable on Sinemet Hyperlipidemia, on statin   Debility deconditioning: Significant debility and deconditioning.  Recommending SNF.  Medically stable.  Peer to peer done,  he's not medically ready with orthostatic hypotension as of 9/3 (eval 9/4 pending)    DVT prophylaxis: lovenox Code Status: full Family Communication: family at bedside Disposition:   Status is: Inpatient Remains inpatient appropriate because: orthostatic hypotension   Consultants:  none  Procedures:  none  Antimicrobials:  Anti-infectives (From admission, onward)    Start     Dose/Rate Route Frequency Ordered Stop   10/24/22 1445  sulfamethoxazole-trimethoprim (BACTRIM DS) 800-160 MG per tablet 2 tablet        2 tablet Oral Every 12 hours 10/24/22 1356 10/28/22 2139   10/23/22 2000  cefTRIAXone (ROCEPHIN) 2 g in sodium chloride 0.9 % 100 mL IVPB  Status:  Discontinued       Placed in "Followed by" Linked Group   2 g 200 mL/hr over 30 Minutes Intravenous Every 24 hours 10/22/22 1909 10/22/22 2120   10/23/22 1400  cefTRIAXone (ROCEPHIN) 2 g in sodium chloride 0.9 % 100 mL IVPB  Status:  Discontinued        2 g 200 mL/hr over 30 Minutes Intravenous Every 24 hours 10/23/22 0943 10/24/22 1356   10/22/22 2200  ceFEPIme (MAXIPIME) 2 g in sodium chloride 0.9 % 100 mL IVPB  Status:  Discontinued        2 g 200 mL/hr over 30 Minutes Intravenous Every 8 hours 10/22/22 2120 10/23/22 0943   10/22/22 2000  cefTRIAXone (ROCEPHIN) 1 g in sodium chloride 0.9 % 100 mL IVPB  Status:  Discontinued       Placed in "Followed by" Linked Group   1 g 200 mL/hr over 30 Minutes Intravenous  Once 10/22/22 1909 10/22/22 2120   10/22/22 1900  ceFEPIme (MAXIPIME) 2 g in sodium chloride 0.9 %  100 mL IVPB  Status:  Discontinued        2 g 200 mL/hr over 30 Minutes Intravenous Every 8 hours 10/22/22 1732 10/22/22 1909   10/22/22 0050  cefTRIAXone (ROCEPHIN) 1 g in sodium chloride 0.9 % 100 mL IVPB  Status:  Discontinued        1 g 200 mL/hr over 30 Minutes Intravenous Every 24 hours 10/22/22 0050 10/22/22 1732   10/21/22 1930  cefTRIAXone (ROCEPHIN) 1 g in sodium chloride 0.9 % 100 mL IVPB        1  g 200 mL/hr over 30 Minutes Intravenous  Once 10/21/22 1925 10/21/22 2048       Subjective: No complaints Daughter and son in law at bedside  Objective: Vitals:   10/31/22 1217 10/31/22 1635 10/31/22 2157 11/01/22 0545  BP: (!) 157/140 129/75 110/71 124/75  Pulse: 97 93 90 70  Resp:  18 20 19   Temp:  98 F (36.7 C) 98.1 F (36.7 C) 98.3 F (36.8 C)  TempSrc:      SpO2:  96% 93% 99%  Weight:      Height:        Intake/Output Summary (Last 24 hours) at 11/01/2022 0911 Last data filed at 11/01/2022 0500 Gross per 24 hour  Intake 404.92 ml  Output 1850 ml  Net -1445.08 ml   Filed Weights   10/21/22 1849  Weight: 88 kg    Examination:  General: No acute distress. Cardiovascular: RRR Lungs: CTAB, unlabored Neurological: Alert Moves all extremities 4. Cranial nerves II through XII grossly intact. Extremities: No clubbing or cyanosis. No edema.   Data Reviewed: I have personally reviewed following labs and imaging studies  CBC: Recent Labs  Lab 10/31/22 1029 11/01/22 0643  WBC 5.2 5.3  NEUTROABS 3.4 3.1  HGB 10.2* 10.7*  HCT 31.4* 33.3*  MCV 64.6* 65.7*  PLT 336 292    Basic Metabolic Panel: Recent Labs  Lab 10/31/22 1029 11/01/22 0643  NA 132* 133*  K 3.9 4.2  CL 102 101  CO2 22 23  GLUCOSE 104* 91  BUN 17 16  CREATININE 0.87 0.89  CALCIUM 8.9 9.3  MG 1.5* 2.1  PHOS 3.1 3.5    GFR: Estimated Creatinine Clearance: 73.9 mL/min (by C-G formula based on SCr of 0.89 mg/dL).  Liver Function Tests: Recent Labs  Lab 10/31/22 1029 11/01/22 0643  AST 14* 13*  ALT 8 24  ALKPHOS 75 77  BILITOT 0.6 0.7  PROT 7.2 7.2  ALBUMIN 3.4* 3.4*    CBG: Recent Labs  Lab 10/27/22 0715 10/27/22 1138 10/27/22 1631 10/27/22 2019 10/28/22 0729  GLUCAP 84 102* 123* 115* 95     No results found for this or any previous visit (from the past 240 hour(s)).        Radiology Studies: No results found.      Scheduled Meds:  atorvastatin  20 mg  Oral QPM   carbidopa-levodopa  2 tablet Oral TID WC   enoxaparin (LOVENOX) injection  40 mg Subcutaneous Q24H   escitalopram  20 mg Oral Daily   loratadine  10 mg Oral Daily   midodrine  10 mg Oral TID WC   polyethylene glycol  17 g Oral BID   traZODone  100 mg Oral QHS   Continuous Infusions:   LOS: 10 days    Time spent: over 30 min    Lacretia Nicks, MD [facility]   To contact the attending provider between 7A-7P or the covering  provider during after hours 7P-7A, please log into the web site [email or web address] and access using universal Herman password for that web site. If you do not have the password, please call the hospital operator.  11/01/2022, 9:11 AM

## 2022-11-01 NOTE — TOC Progression Note (Addendum)
Transition of Care [facility]) - Progression Note    Patient Details  Name: Darrold Tripplett MRN: [id number] Date of Birth: 1945/04/29  [facility] CM/SW Contact  Erin Sons, [place] Phone Number: 11/01/2022, 11:38 AM  Clinical Narrative:     CSW resubmitted SNF auth request. [facility] is pending. Pt to DC [facility] SNF once medically stable and [facility] is approved.  1540: Auth still pending at this time.   Expected Discharge Plan: Skilled Nursing Facility Barriers to Discharge: Continued Medical Work up, English as a second language teacher  Expected Discharge Plan and Services                                               Social Determinants of Health (SDOH) Interventions SDOH Screenings   Food Insecurity: No Food Insecurity (10/31/2022)  Housing: Low Risk  (10/31/2022)  Transportation Needs: No Transportation Needs (10/31/2022)  Utilities: Not At Risk (10/31/2022)  Tobacco Use: High Risk (10/21/2022)    Readmission Risk Interventions    10/22/2022   12:12 PM 09/05/2022   11:23 AM  Readmission Risk Prevention Plan  Post Dischage Appt  Complete  Medication Screening  Complete  Transportation Screening Complete Complete  PCP or Specialist Appt within 5-7 Days Complete   Home Care Screening Complete   Medication Review (RN CM) Referral to Pharmacy

## 2022-11-01 NOTE — Progress Notes (Signed)
  Echocardiogram 2D Echocardiogram has been performed.  Tony [name] 11/01/2022, 12:00 PM

## 2022-11-02 DIAGNOSIS — Z7189 Other specified counseling: Secondary | ICD-10-CM

## 2022-11-02 DIAGNOSIS — Z515 Encounter for palliative care: Secondary | ICD-10-CM

## 2022-11-02 DIAGNOSIS — N390 Urinary tract infection, site not specified: Secondary | ICD-10-CM | POA: Diagnosis not present

## 2022-11-02 DIAGNOSIS — A419 Sepsis, unspecified organism: Secondary | ICD-10-CM | POA: Diagnosis not present

## 2022-11-02 LAB — CBC
HCT: 31.7 % — ABNORMAL LOW (ref 39.0–52.0)
Hemoglobin: 10.3 g/dL — ABNORMAL LOW (ref 13.0–17.0)
MCH: 21.2 pg — ABNORMAL LOW (ref 26.0–34.0)
MCHC: 32.5 g/dL (ref 30.0–36.0)
MCV: 65.2 fL — ABNORMAL LOW (ref 80.0–100.0)
Platelets: 294 10*3/uL (ref 150–400)
RBC: 4.86 MIL/uL (ref 4.22–5.81)
RDW: 15.1 % (ref 11.5–15.5)
WBC: 5.8 10*3/uL (ref 4.0–10.5)
nRBC: 0 % (ref 0.0–0.2)

## 2022-11-02 LAB — BASIC METABOLIC PANEL
Anion gap: 17 — ABNORMAL HIGH (ref 5–15)
BUN: 22 mg/dL (ref 8–23)
CO2: 22 mmol/L (ref 22–32)
Calcium: 9.3 mg/dL (ref 8.9–10.3)
Chloride: 98 mmol/L (ref 98–111)
Creatinine, Ser: 0.84 mg/dL (ref 0.61–1.24)
GFR, Estimated: >60 mL/min (ref >60)
Glucose, Bld: 84 mg/dL (ref 70–99)
Potassium: 3.9 mmol/L (ref 3.5–5.1)
Sodium: 137 mmol/L (ref 135–145)

## 2022-11-02 LAB — PHOSPHORUS: Phosphorus: 3.8 mg/dL (ref 2.5–4.6)

## 2022-11-02 LAB — MAGNESIUM: Magnesium: 1.7 mg/dL (ref 1.7–2.4)

## 2022-11-02 NOTE — Progress Notes (Signed)
Call received from [facility] with [employer] requesting a peer-to-peer review for SNF by 12pm tomorrow 9/8. Review can be completed by calling Dr. Logan Bores directly [phone]. MD updated.   Dellie Burns, MSW, LCSW [phone] (coverage)

## 2022-11-02 NOTE — Plan of Care (Signed)

## 2022-11-02 NOTE — Consult Note (Signed)
Palliative Care Consult Note                                  Date: 11/02/2022   Patient Name: Tony [name]  DOB: 1945/03/16  MRN: [id number]  Age / Sex: 77 y.o., male  PCP: Chilton Greathouse, MD Referring Physician: Zigmund Daniel., *  Reason for Consultation: Establishing goals of care  HPI/Patient Profile: 77 y.o. male  with past medical history of Parkinson's, cognitive impairment, left hemineglect, dyspraxia, hyperlipidemia and anxiety admitted on 10/21/2022 from [facility] his assisted living facility with altered mental status and weakness.   He was admitted with acute UTI. Since hospitalization he has had significant orthostatic hypotension.   Past Medical History:  Diagnosis Date   Anxiety    Boil of buttock    High cholesterol    Imbalance    Low back pain    Parkinsons     Subjective:   I have reviewed medical records including EPIC notes, labs and imaging, assessed the patient and then met in the patient's room with the patient, his wife Enid Derry, daughter, and son-in-law  to discuss diagnosis prognosis, GOC, EOL wishes, disposition and options.  I introduced Palliative Medicine as specialized medical care for people living with serious illness. It focuses on providing relief from symptoms and stress of a serious illness. The goal is to improve quality of life for both the patient and the family.  Patient/Family Understanding of Illness: The patient and family have a good understanding of the patient's Parkinson's and his acute sepis due to UTI.  Social History: Patient is married with 5 adult children. 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 He lived with his wife in their home until a few months ago when it became too much and he moved into an assisted living facility, [place].  Functional State: The patient was very active until 3-4 years ago when he had a fall and was diagnosed with  Parkinson's. He was living in an ALF and walking with a cane before admission.   Today's Discussion: We discussed the patients decline in function over time and the progression of Parkinson's. We discussed the patient's goal to eventually get back to assisted living.   Discussed the importance of continued conversation with family and the medical providers regarding overall plan of care and treatment options, ensuring decisions are within the context of the patient's values and GOCs.  A discussion was had today regarding advanced directives. Concepts specific to code status, artifical feeding and hydration, continued IV antibiotics and rehospitalization was had.  The difference between a aggressive medical intervention path and a palliative comfort care path for this patient at this time was had. The MOST form was introduced and discussed. The patient has a living will which the patient's daughter Herbert Seta will bring to the hospital tomorrow. The patient and his family plan to discuss his GOC and will then meet with PMT tomorrow.  Discussed what outpatient palliative care could provide for the patient and his family. They would like outpatient follow up.  Questions and concerns were addressed. Hard Choices booklet left  for review. The family was encouraged to call with questions or concerns. PMT will continue to support holistically.  Review of Systems  Constitutional:  Positive for activity change.  Neurological:  Positive for weakness.    Objective:   Primary Diagnoses: Present on Admission:  Sepsis secondary to gram negative rod bacteremia due to UTI [facility])  Parkinson's disease   Physical Exam Vitals reviewed.  Constitutional:      Appearance: He is ill-appearing.  HENT:     Head: Normocephalic and atraumatic.  Cardiovascular:     Rate and Rhythm: Normal rate.  Pulmonary:     Effort: Pulmonary effort is normal.  Skin:    General: Skin is warm and dry.  Neurological:     Mental  Status: He is alert and oriented to person, place, and time.  Psychiatric:        Mood and Affect: Mood normal.    Vital Signs:  BP (!) 117/93 (BP Location: Left Arm)   Pulse 72   Temp (!) 97.5 F (36.4 C) (Oral)   Resp 16   Ht 6' (1.829 m)   Wt 88 kg   SpO2 99%   BMI 26.31 kg/m    Advanced Care Planning:   Existing Vynca/ACP Documentation: None  Primary Decision Maker: PATIENT  Code Status/Advance Care Planning: Full code   Assessment & Plan:   SUMMARY OF RECOMMENDATIONS   Full code Full scope Continued conversation around GOC- family will bring advanced directives tomorrow Outpatient palliative care follow up at discharge Continued PMT support   Discussed with: Dr. Lowell Guitar  Time Total: 80 minutes   Thank you for allowing [place] to participate in the care of Naithen Schuppe PMT will continue to support holistically.   Signed by: Sarina Ser, NP Palliative Medicine Team  Team Phone # [phone] (Nights/Weekends)  11/02/2022, 11:14 AM

## 2022-11-02 NOTE — Progress Notes (Signed)
PROGRESS NOTE    Tony [name]  ONG:[id number] DOB: 07-21-1945 DOA: 10/21/2022 PCP: Chilton Greathouse, MD  Chief Complaint  Patient presents with   Altered Mental Status    Brief Narrative:   77 year old with Parkinson's, cognitive impairment, left hemineglect, dyspraxia, hyperlipidemia and anxiety presented with altered mental status and weakness.  Could not walk with Duvall Comes walker.  He lives in assisted living facility at [facility].  Admitted with acute UTI , temperature 103, tachycardic, WBC count 18.  Found to have E. coli bacteremia. Remains in the hospital.  Waiting for SNF bed availability.  Also has significant orthostatic hypotension causing difficulty with mobility.  Assessment & Plan:   Principal Problem:   Sepsis secondary to gram negative rod bacteremia due to UTI [facility]) Active Problems:   HLD (hyperlipidemia)   Parkinson's disease   Hyponatremia   Normocytic anemia  Sepsis present on admission secondary to E. coli UTI and bacteremia.  Presented with fever, tachycardia and leukocytosis. Sepsis physiology improving. Blood cultures with E. coli no resistance.  Unfortunately urine cultures were not collected. Completed course of antibiotics Renal ultrasound did not show any hydronephrosis or urine retention.  Does have prostatomegaly.  No significant postvoid residuals.   Orthostatic hypotension:  Remains orthostatic 9/5 Follow repeat orthostatics today Thigh high compression stockings, abdominal binder Could be related to parkinoson's disease (most like) or flomax vs other causes Midodrine Cortisol wnl  TSH wnl Flomax is on hold Consider reduction in lexapro as SSRI's can contribute Echo as below - EF 50-55%, grade 1 diastolic dysfunction Tilt bed ordered  BPH Holding flomax, follow bladder scans   Chronic medical issues including Parkinson's, stable on Sinemet Hyperlipidemia, on statin   Debility deconditioning: Significant debility and deconditioning.   Recommending SNF.  Medically stable.  Peer to peer done, he's not medically ready with orthostatic hypotension as of 9/3 (eval 9/4 pending)    DVT prophylaxis: lovenox Code Status: full Family Communication: family at bedside Disposition:   Status is: Inpatient Remains inpatient appropriate because: orthostatic hypotension   Consultants:  none  Procedures:  Echo IMPRESSIONS     1. Left ventricular ejection fraction, by estimation, is 50 to 55%. Left  ventricular ejection fraction by PLAX is 54 %. The left ventricle has low  normal function. The left ventricle has no regional wall motion  abnormalities. There is mild left  ventricular hypertrophy. Left ventricular diastolic parameters are  consistent with Grade I diastolic dysfunction (impaired relaxation).   2. Right ventricular systolic function is normal. The right ventricular  size is normal.   3. The mitral valve is grossly normal. No evidence of mitral valve  regurgitation.   4. The aortic valve is tricuspid. Aortic valve regurgitation is not  visualized. Aortic valve sclerosis is present, with no evidence of aortic  valve stenosis.   5. Aortic dilatation noted. There is mild dilatation of the aortic root,  measuring 40 mm.   6. The inferior vena cava is normal in size with greater than 50%  respiratory variability, suggesting right atrial pressure of 3 mmHg.   Comparison(s): No prior Echocardiogram.   Antimicrobials:  Anti-infectives (From admission, onward)    Start     Dose/Rate Route Frequency Ordered Stop   10/24/22 1445  sulfamethoxazole-trimethoprim (BACTRIM DS) 800-160 MG per tablet 2 tablet        2 tablet Oral Every 12 hours 10/24/22 1356 10/28/22 2139   10/23/22 2000  cefTRIAXone (ROCEPHIN) 2 g in sodium chloride 0.9 % 100 mL IVPB  Status:  Discontinued       Placed in "Followed by" Linked Group   2 g 200 mL/hr over 30 Minutes Intravenous Every 24 hours 10/22/22 1909 10/22/22 2120   10/23/22 1400   cefTRIAXone (ROCEPHIN) 2 g in sodium chloride 0.9 % 100 mL IVPB  Status:  Discontinued        2 g 200 mL/hr over 30 Minutes Intravenous Every 24 hours 10/23/22 0943 10/24/22 1356   10/22/22 2200  ceFEPIme (MAXIPIME) 2 g in sodium chloride 0.9 % 100 mL IVPB  Status:  Discontinued        2 g 200 mL/hr over 30 Minutes Intravenous Every 8 hours 10/22/22 2120 10/23/22 0943   10/22/22 2000  cefTRIAXone (ROCEPHIN) 1 g in sodium chloride 0.9 % 100 mL IVPB  Status:  Discontinued       Placed in "Followed by" Linked Group   1 g 200 mL/hr over 30 Minutes Intravenous  Once 10/22/22 1909 10/22/22 2120   10/22/22 1900  ceFEPIme (MAXIPIME) 2 g in sodium chloride 0.9 % 100 mL IVPB  Status:  Discontinued        2 g 200 mL/hr over 30 Minutes Intravenous Every 8 hours 10/22/22 1732 10/22/22 1909   10/22/22 0050  cefTRIAXone (ROCEPHIN) 1 g in sodium chloride 0.9 % 100 mL IVPB  Status:  Discontinued        1 g 200 mL/hr over 30 Minutes Intravenous Every 24 hours 10/22/22 0050 10/22/22 1732   10/21/22 1930  cefTRIAXone (ROCEPHIN) 1 g in sodium chloride 0.9 % 100 mL IVPB        1 g 200 mL/hr over 30 Minutes Intravenous  Once 10/21/22 1925 10/21/22 2048       Subjective: No complaints this AM Son in law, daughter, and wife at bedside  Objective: Vitals:   11/01/22 1711 11/01/22 1938 11/02/22 0511 11/02/22 0745  BP: (!) 142/83 114/73 113/80 (!) 117/93  Pulse: 90 91 70 72  Resp: 17 16 14 16   Temp: (!) 97.4 F (36.3 C) 97.7 F (36.5 C) 98.6 F (37 C) (!) 97.5 F (36.4 C)  TempSrc: Oral Oral Axillary Oral  SpO2: 99% 93% 97% 99%  Weight:      Height:        Intake/Output Summary (Last 24 hours) at 11/02/2022 1128 Last data filed at 11/02/2022 0800 Gross per 24 hour  Intake 760 ml  Output 280 ml  Net 480 ml   Filed Weights   10/21/22 1849  Weight: 88 kg    Examination:  General: No acute distress. Sitting up. Cardiovascular: RRR Lungs: unlabored Abdomen: Soft, nontender, nondistended   Neurological: Alert and oriented 3. Moves all extremities 4 with equal strength. Cranial nerves II through XII grossly intact. Extremities: thigh high compression stockings, no LEE  Data Reviewed: I have personally reviewed following labs and imaging studies  CBC: Recent Labs  Lab 10/31/22 1029 11/01/22 0643 11/02/22 0643  WBC 5.2 5.3 5.8  NEUTROABS 3.4 3.1  --   HGB 10.2* 10.7* 10.3*  HCT 31.4* 33.3* 31.7*  MCV 64.6* 65.7* 65.2*  PLT 336 292 294    Basic Metabolic Panel: Recent Labs  Lab 10/31/22 1029 11/01/22 0643 11/02/22 0643  NA 132* 133* 137  K 3.9 4.2 3.9  CL 102 101 98  CO2 22 23 22   GLUCOSE 104* 91 84  BUN 17 16 22   CREATININE 0.87 0.89 0.84  CALCIUM 8.9 9.3 9.3  MG 1.5* 2.1 1.7  PHOS 3.1  3.5 3.8    GFR: Estimated Creatinine Clearance: 78.3 mL/min (by C-G formula based on SCr of 0.84 mg/dL).  Liver Function Tests: Recent Labs  Lab 10/31/22 1029 11/01/22 0643  AST 14* 13*  ALT 8 24  ALKPHOS 75 77  BILITOT 0.6 0.7  PROT 7.2 7.2  ALBUMIN 3.4* 3.4*    CBG: Recent Labs  Lab 10/27/22 0715 10/27/22 1138 10/27/22 1631 10/27/22 2019 10/28/22 0729  GLUCAP 84 102* 123* 115* 95     No results found for this or any previous visit (from the past 240 hour(s)).        Radiology Studies: ECHOCARDIOGRAM COMPLETE  Result Date: 11/01/2022    ECHOCARDIOGRAM REPORT   Patient Name:   [facility] Avetisyan Date of Exam: 11/01/2022 Medical Rec #:  [id number]     Height:       72.0 in Accession #:    [id number]    Weight:       194.0 lb Date of Birth:  08-Feb-1946     BSA:          2.103 m Patient Age:    77 years      BP:           114/72 mmHg Patient Gender: M             HR:           76 bpm. Exam Location:  Inpatient Procedure: 2D Echo, Cardiac Doppler and Color Doppler Indications:    Other abnormalities of the heart R00.8  History:        Patient has no prior history of Echocardiogram examinations.                 Sepsis; Risk Factors:Dyslipidemia and  Non-Smoker.  Sonographer:    Alvie Heidelberg Referring Phys: [phone] Darek Eifler CALDWELL POWELL JR  Sonographer Comments: Suboptimal apical window and suboptimal subcostal window. IMPRESSIONS  1. Left ventricular ejection fraction, by estimation, is 50 to 55%. Left ventricular ejection fraction by PLAX is 54 %. The left ventricle has low normal function. The left ventricle has no regional wall motion abnormalities. There is mild left ventricular hypertrophy. Left ventricular diastolic parameters are consistent with Grade I diastolic dysfunction (impaired relaxation).  2. Right ventricular systolic function is normal. The right ventricular size is normal.  3. The mitral valve is grossly normal. No evidence of mitral valve regurgitation.  4. The aortic valve is tricuspid. Aortic valve regurgitation is not visualized. Aortic valve sclerosis is present, with no evidence of aortic valve stenosis.  5. Aortic dilatation noted. There is mild dilatation of the aortic root, measuring 40 mm.  6. The inferior vena cava is normal in size with greater than 50% respiratory variability, suggesting right atrial pressure of 3 mmHg. Comparison(s): No prior Echocardiogram. FINDINGS  Left Ventricle: Left ventricular ejection fraction, by estimation, is 50 to 55%. Left ventricular ejection fraction by PLAX is 54 %. The left ventricle has low normal function. The left ventricle has no regional wall motion abnormalities. The left ventricular internal cavity size was normal in size. There is mild left ventricular hypertrophy. Left ventricular diastolic parameters are consistent with Grade I diastolic dysfunction (impaired relaxation). Indeterminate filling pressures. Right Ventricle: The right ventricular size is normal. No increase in right ventricular wall thickness. Right ventricular systolic function is normal. Left Atrium: Left atrial size was normal in size. Right Atrium: Right atrial size was normal in size. Pericardium: Trivial pericardial  effusion is present. The pericardial effusion is  localized near the right ventricle. Mitral Valve: The mitral valve is grossly normal. No evidence of mitral valve regurgitation. Tricuspid Valve: The tricuspid valve is grossly normal. Tricuspid valve regurgitation is trivial. Aortic Valve: The aortic valve is tricuspid. Aortic valve regurgitation is not visualized. Aortic valve sclerosis is present, with no evidence of aortic valve stenosis. Pulmonic Valve: The pulmonic valve was not well visualized. Pulmonic valve regurgitation is not visualized. Aorta: Aortic dilatation noted. There is mild dilatation of the aortic root, measuring 40 mm. Venous: The inferior vena cava is normal in size with greater than 50% respiratory variability, suggesting right atrial pressure of 3 mmHg. IAS/Shunts: No atrial level shunt detected by color flow Doppler.  LEFT VENTRICLE PLAX 2D LV EF:         Left            Diastology                ventricular     LV e' medial:    5.00 cm/s                ejection        LV E/e' medial:  9.9                fraction by     LV e' lateral:   8.38 cm/s                PLAX is 54      LV E/e' lateral: 5.9                %. LVIDd:         4.30 cm LVIDs:         3.10 cm LV PW:         1.10 cm LV IVS:        0.60 cm LVOT diam:     2.20 cm LV SV:         37 LV SV Index:   18 LVOT Area:     3.80 cm  LEFT ATRIUM           Index       RIGHT ATRIUM           Index LA diam:      2.30 cm 1.09 cm/m  RA Area:     13.50 cm LA Vol (A4C): 13.0 ml 6.18 ml/m  RA Volume:   26.80 ml  12.74 ml/m  AORTIC VALVE LVOT Vmax:   61.10 cm/s LVOT Vmean:  39.100 cm/s LVOT VTI:    0.098 m  AORTA Ao Root diam: 3.85 cm Ao Asc diam:  4.00 cm MITRAL VALVE MV Area (PHT): 3.63 cm    SHUNTS MV Decel Time: 209 msec    Systemic VTI:  0.10 m MV E velocity: 49.70 cm/s  Systemic Diam: 2.20 cm MV Vrinda Heckstall velocity: 57.60 cm/s MV E/Keonta Monceaux ratio:  0.86 Zoila Shutter MD Electronically signed by Zoila Shutter MD Signature Date/Time: 11/01/2022/12:40:39 PM     Final         Scheduled Meds:  atorvastatin  20 mg Oral QPM   carbidopa-levodopa  2 tablet Oral TID WC   enoxaparin (LOVENOX) injection  40 mg Subcutaneous Q24H   escitalopram  20 mg Oral Daily   loratadine  10 mg Oral Daily   midodrine  10 mg Oral TID WC   polyethylene glycol  17 g Oral BID   traZODone  100 mg Oral QHS   Continuous Infusions:  LOS: 11 days    Time spent: over 30 min    Lacretia Nicks, MD [facility]   To contact the attending provider between 7A-7P or the covering provider during after hours 7P-7A, please log into the web site [email or web address] and access using universal [place] password for that web site. If you do not have the password, please call the hospital operator.  11/02/2022, 11:28 AM

## 2022-11-03 DIAGNOSIS — N390 Urinary tract infection, site not specified: Secondary | ICD-10-CM | POA: Diagnosis not present

## 2022-11-03 DIAGNOSIS — A419 Sepsis, unspecified organism: Secondary | ICD-10-CM | POA: Diagnosis not present

## 2022-11-03 MED ORDER — COSYNTROPIN 0.25 MG IJ SOLR
0.2500 mg | Freq: Once | INTRAMUSCULAR | Status: AC
  Administered 2022-11-04: 0.25 mg via INTRAVENOUS
  Filled 2022-11-03: qty 0.25

## 2022-11-03 MED ORDER — ESCITALOPRAM OXALATE 10 MG PO TABS
10.0000 mg | ORAL_TABLET | Freq: Every day | ORAL | Status: DC
  Administered 2022-11-04: 10 mg via ORAL
  Filled 2022-11-03: qty 1

## 2022-11-03 NOTE — TOC Progression Note (Signed)
Call received from [place] with [employer] who reports SNF auth request for [facility] as been approved after peer-to-peer. Auth # [id number], 7 days. Updated [place] at [facility] who confirmed they are able to accept pt tomorrow.   Dellie Burns, MSW, LCSW [phone] (coverage)

## 2022-11-03 NOTE — Plan of Care (Signed)

## 2022-11-03 NOTE — Progress Notes (Signed)
Daily Progress Note   Patient Name: Tony [name]       Date: 11/03/2022 DOB: 1945/06/09  Age: 77 y.o. MRN#: [id number] Attending Physician: Zigmund Daniel., * Primary Care Physician: Chilton Greathouse, MD Admit Date: 10/21/2022  Reason for Consultation/Follow-up: Establishing goals of care  Length of Stay: 12  Current Medications: Scheduled Meds:   atorvastatin  20 mg Oral QPM   carbidopa-levodopa  2 tablet Oral TID WC   enoxaparin (LOVENOX) injection  40 mg Subcutaneous Q24H   [START ON 11/04/2022] escitalopram  10 mg Oral Daily   loratadine  10 mg Oral Daily   midodrine  10 mg Oral TID WC   polyethylene glycol  17 g Oral BID   traZODone  100 mg Oral QHS    Continuous Infusions:   PRN Meds: acetaminophen, LORazepam, mouth rinse  Physical Exam Vitals reviewed.  HENT:     Head: Normocephalic and atraumatic.  Pulmonary:     Effort: Pulmonary effort is normal.  Skin:    General: Skin is warm and dry.  Neurological:     Mental Status: He is alert.     Comments: Some confusion             Vital Signs: BP 122/78 (BP Location: Left Arm)   Pulse 74   Temp 98 F (36.7 C) (Oral)   Resp 18   Ht 6' (1.829 m)   Wt 88 kg   SpO2 97%   BMI 26.31 kg/m  SpO2: SpO2: 97 % O2 Device: O2 Device: Room Air O2 Flow Rate:          Patient Active Problem List   Diagnosis Date Noted   Sepsis secondary to gram negative rod bacteremia due to UTI (HCC) 10/22/2022   Hyponatremia 10/22/2022   Normocytic anemia 10/22/2022   Multiple rib fractures 09/04/2022   Intractable pain 09/04/2022   History of falling 11/29/2021   Unsteadiness on feet 11/29/2021   Excessive sleepiness 11/08/2021   Cognitive impairment 12/25/2020   Anxiety 04/25/2020   Gait abnormality 03/30/2020    Parkinson's disease 03/30/2020   Memory loss 03/30/2020   Parkinsonism 03/30/2020   CELLULITIS AND ABSCESS OF FACE 02/01/2008   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/2006   HLD (hyperlipidemia) 06/30/2006   DIVERTICULOSIS, COLON 06/30/2006   SEBORRHEIC KERATOSIS  06/30/2006   BACK PAIN, LUMBAR 06/30/2006    Palliative Care Assessment & Plan   Patient Profile: 77 y.o. male  with past medical history of Parkinson's, cognitive impairment, left hemineglect, dyspraxia, hyperlipidemia and anxiety admitted on 10/21/2022 from [facility] his assisted living facility with altered mental status and weakness.    He was admitted with acute UTI. Since hospitalization he has had significant orthostatic hypotension.   Assessment: Patient continues to have orthostatic hypotension. Met with patient, wife, and two daughters to discuss goals of care. During the conversation the patient seems more confused today than yesterday. I feel it is too much for him to discuss today, so I met separately with his daughters to discuss code status.  We discussed code status. Recommended consideration of DNR status, understanding evidenced-based poor outcomes in similar hospitalized patients, as the cause of the arrest is likely associated with chronic/terminal disease rather than a reversible acute cardio-pulmonary event. They are leaning towards changing his code status but need to verify with their mother and other siblings.  We discussed the MOST form in detail. They are leaning towards a limited scope but want to verify with their mother and other siblings.  I encouraged them to make decisions based on what their father would choose if he were able to make decisions himself. We discussed quality of life vs quantity of life. We discussed potential disposition options-- they had some questions for SW so I asked SW to reach out to them.  Discussed the importance of continued conversation with family and the  medical providers regarding overall plan of care and treatment options, ensuring decisions are within the context of the patient's values and GOCs.  Questions and concerns were addressed. The patient's daughters plan to continue the conversation with their family. The family was encouraged to call with questions or concerns. PMT will continue to support holistically with follow up Tuesday or sooner if needed.  Recommendations/Plan: Full code Full scope Continued conversation around GOC- will have Living Will scanned into [employer], daughter to email me HCPOA Outpatient palliative care follow up at discharge Continued PMT support   Code Status:    Code Status Orders  (From admission, onward)           Start     Ordered   10/22/22 0353  Full code  Continuous       Question:  By:  Answer:  Consent: discussion documented in EHR   10/22/22 0353           Extensive chart review has been completed prior to meeting with patient/family including labs, vital signs, imaging, progress/consult notes, orders, medications, and available advance directive documents.  Care plan was discussed with Dr. Lowell Guitar and SW  Time spent: 70  minutes  Thank you for allowing the Palliative Medicine Team to assist in the care of this patient.   Sherryll Burger, NP  Please contact Palliative Medicine Team phone at [phone] for questions and concerns.

## 2022-11-03 NOTE — Progress Notes (Addendum)
PROGRESS NOTE    Tony [name]  ZOX:[id number] DOB: 30-Oct-1945 DOA: 10/21/2022 PCP: Chilton Greathouse, MD  Chief Complaint  Patient presents with   Altered Mental Status    Brief Narrative:   77 year old with Parkinson's, cognitive impairment, left hemineglect, dyspraxia, hyperlipidemia and anxiety presented with altered mental status and weakness.  Could not walk with Payton Prinsen walker.  He lives in assisted living facility at [place].  Admitted with acute UTI , temperature 103, tachycardic, WBC count 18.  Found to have E. coli bacteremia. Remains in the hospital.  Waiting for SNF bed availability.  Also has significant orthostatic hypotension causing difficulty with mobility.  Assessment & Plan:   Principal Problem:   Sepsis secondary to gram negative rod bacteremia due to UTI [facility]) Active Problems:   HLD (hyperlipidemia)   Parkinson's disease   Hyponatremia   Normocytic anemia  Sepsis present on admission secondary to E. coli UTI and bacteremia.  Presented with fever, tachycardia and leukocytosis. Sepsis physiology improving. Blood cultures with E. coli no resistance.  Unfortunately urine cultures were not collected. Completed course of antibiotics Renal ultrasound did not show any hydronephrosis or urine retention.  Does have prostatomegaly.  No significant postvoid residuals.   Orthostatic hypotension:  Remains orthostatic 9/5 Follow repeat orthostatics today Thigh high compression stockings, abdominal binder Could be related to parkinoson's disease (most like) or flomax vs other causes Midodrine Cortisol wnl  TSH wnl Flomax is on hold Reducing SSRI (lexapro to 10 mg) Echo as below - EF 50-55%, grade 1 diastolic dysfunction Tilt bed ordered  BPH Holding flomax, follow bladder scans   Chronic medical issues including Parkinson's, stable on Sinemet Hyperlipidemia, on statin   Debility deconditioning: Significant debility and deconditioning.  Recommending SNF.  Medically  stable.  Peer to peer done again today ->  Per my discussion with family, patient was independent in ALF with Skyanne Welle walker prior to this hospitalization. Dr. Michel Harrow to review chart again and get back to me.  Ok with me if he leaves room in wheelchair with family, but defer to floor and hospital policy.     DVT prophylaxis: lovenox Code Status: full Family Communication: family at bedside Disposition:   Status is: Inpatient Remains inpatient appropriate because: orthostatic hypotension   Consultants:  none  Procedures:  Echo IMPRESSIONS     1. Left ventricular ejection fraction, by estimation, is 50 to 55%. Left  ventricular ejection fraction by PLAX is 54 %. The left ventricle has low  normal function. The left ventricle has no regional wall motion  abnormalities. There is mild left  ventricular hypertrophy. Left ventricular diastolic parameters are  consistent with Grade I diastolic dysfunction (impaired relaxation).   2. Right ventricular systolic function is normal. The right ventricular  size is normal.   3. The mitral valve is grossly normal. No evidence of mitral valve  regurgitation.   4. The aortic valve is tricuspid. Aortic valve regurgitation is not  visualized. Aortic valve sclerosis is present, with no evidence of aortic  valve stenosis.   5. Aortic dilatation noted. There is mild dilatation of the aortic root,  measuring 40 mm.   6. The inferior vena cava is normal in size with greater than 50%  respiratory variability, suggesting right atrial pressure of 3 mmHg.   Comparison(s): No prior Echocardiogram.   Antimicrobials:  Anti-infectives (From admission, onward)    Start     Dose/Rate Route Frequency Ordered Stop   10/24/22 1445  sulfamethoxazole-trimethoprim (BACTRIM DS) 800-160 MG per tablet  2 tablet        2 tablet Oral Every 12 hours 10/24/22 1356 10/28/22 2139   10/23/22 2000  cefTRIAXone (ROCEPHIN) 2 g in sodium chloride 0.9 % 100 mL IVPB  Status:   Discontinued       Placed in "Followed by" Linked Group   2 g 200 mL/hr over 30 Minutes Intravenous Every 24 hours 10/22/22 1909 10/22/22 2120   10/23/22 1400  cefTRIAXone (ROCEPHIN) 2 g in sodium chloride 0.9 % 100 mL IVPB  Status:  Discontinued        2 g 200 mL/hr over 30 Minutes Intravenous Every 24 hours 10/23/22 0943 10/24/22 1356   10/22/22 2200  ceFEPIme (MAXIPIME) 2 g in sodium chloride 0.9 % 100 mL IVPB  Status:  Discontinued        2 g 200 mL/hr over 30 Minutes Intravenous Every 8 hours 10/22/22 2120 10/23/22 0943   10/22/22 2000  cefTRIAXone (ROCEPHIN) 1 g in sodium chloride 0.9 % 100 mL IVPB  Status:  Discontinued       Placed in "Followed by" Linked Group   1 g 200 mL/hr over 30 Minutes Intravenous  Once 10/22/22 1909 10/22/22 2120   10/22/22 1900  ceFEPIme (MAXIPIME) 2 g in sodium chloride 0.9 % 100 mL IVPB  Status:  Discontinued        2 g 200 mL/hr over 30 Minutes Intravenous Every 8 hours 10/22/22 1732 10/22/22 1909   10/22/22 0050  cefTRIAXone (ROCEPHIN) 1 g in sodium chloride 0.9 % 100 mL IVPB  Status:  Discontinued        1 g 200 mL/hr over 30 Minutes Intravenous Every 24 hours 10/22/22 0050 10/22/22 1732   10/21/22 1930  cefTRIAXone (ROCEPHIN) 1 g in sodium chloride 0.9 % 100 mL IVPB        1 g 200 mL/hr over 30 Minutes Intravenous  Once 10/21/22 1925 10/21/22 2048       Subjective: No complaints Wife, daughters at bedside  Objective: Vitals:   11/02/22 1702 11/02/22 2142 11/03/22 0525 11/03/22 0847  BP: 116/79 (!) 134/91 127/82 122/78  Pulse: 87 95 72 74  Resp:  16 16 18   Temp: 97.7 F (36.5 C) 98 F (36.7 C) 97.9 F (36.6 C) 98 F (36.7 C)  TempSrc:  Oral Oral Oral  SpO2: 96% 96% 99% 97%  Weight:      Height:        Intake/Output Summary (Last 24 hours) at 11/03/2022 1041 Last data filed at 11/03/2022 0848 Gross per 24 hour  Intake 340 ml  Output 1500 ml  Net -1160 ml   Filed Weights   10/21/22 1849  Weight: 88 kg     Examination:  General: No acute distress. Cardiovascular: RRR Lungs: unlabored Neurological: Alert and oriented 3. Moves all extremities 4 with equal strength. Cranial nerves II through XII grossly intact. Extremities: No clubbing or cyanosis. No edema.  Data Reviewed: I have personally reviewed following labs and imaging studies  CBC: Recent Labs  Lab 10/31/22 1029 11/01/22 0643 11/02/22 0643  WBC 5.2 5.3 5.8  NEUTROABS 3.4 3.1  --   HGB 10.2* 10.7* 10.3*  HCT 31.4* 33.3* 31.7*  MCV 64.6* 65.7* 65.2*  PLT 336 292 294    Basic Metabolic Panel: Recent Labs  Lab 10/31/22 1029 11/01/22 0643 11/02/22 0643  NA 132* 133* 137  K 3.9 4.2 3.9  CL 102 101 98  CO2 22 23 22   GLUCOSE 104* 91 84  BUN  17 16 22   CREATININE 0.87 0.89 0.84  CALCIUM 8.9 9.3 9.3  MG 1.5* 2.1 1.7  PHOS 3.1 3.5 3.8    GFR: Estimated Creatinine Clearance: 78.3 mL/min (by C-G formula based on SCr of 0.84 mg/dL).  Liver Function Tests: Recent Labs  Lab 10/31/22 1029 11/01/22 0643  AST 14* 13*  ALT 8 24  ALKPHOS 75 77  BILITOT 0.6 0.7  PROT 7.2 7.2  ALBUMIN 3.4* 3.4*    CBG: Recent Labs  Lab 10/27/22 1138 10/27/22 1631 10/27/22 2019 10/28/22 0729  GLUCAP 102* 123* 115* 95     No results found for this or any previous visit (from the past 240 hour(s)).        Radiology Studies: ECHOCARDIOGRAM COMPLETE  Result Date: 11/01/2022    ECHOCARDIOGRAM REPORT   Patient Name:   Tony [name] Date of Exam: 11/01/2022 Medical Rec #:  [id number]     Height:       72.0 in Accession #:    [id number]    Weight:       194.0 lb Date of Birth:  1946/01/23     BSA:          2.103 m Patient Age:    77 years      BP:           114/72 mmHg Patient Gender: M             HR:           76 bpm. Exam Location:  Inpatient Procedure: 2D Echo, Cardiac Doppler and Color Doppler Indications:    Other abnormalities of the heart R00.8  History:        Patient has no prior history of Echocardiogram examinations.                  Sepsis; Risk Factors:Dyslipidemia and Non-Smoker.  Sonographer:    Alvie Heidelberg Referring Phys: [phone] Astra Gregg CALDWELL POWELL JR  Sonographer Comments: Suboptimal apical window and suboptimal subcostal window. IMPRESSIONS  1. Left ventricular ejection fraction, by estimation, is 50 to 55%. Left ventricular ejection fraction by PLAX is 54 %. The left ventricle has low normal function. The left ventricle has no regional wall motion abnormalities. There is mild left ventricular hypertrophy. Left ventricular diastolic parameters are consistent with Grade I diastolic dysfunction (impaired relaxation).  2. Right ventricular systolic function is normal. The right ventricular size is normal.  3. The mitral valve is grossly normal. No evidence of mitral valve regurgitation.  4. The aortic valve is tricuspid. Aortic valve regurgitation is not visualized. Aortic valve sclerosis is present, with no evidence of aortic valve stenosis.  5. Aortic dilatation noted. There is mild dilatation of the aortic root, measuring 40 mm.  6. The inferior vena cava is normal in size with greater than 50% respiratory variability, suggesting right atrial pressure of 3 mmHg. Comparison(s): No prior Echocardiogram. FINDINGS  Left Ventricle: Left ventricular ejection fraction, by estimation, is 50 to 55%. Left ventricular ejection fraction by PLAX is 54 %. The left ventricle has low normal function. The left ventricle has no regional wall motion abnormalities. The left ventricular internal cavity size was normal in size. There is mild left ventricular hypertrophy. Left ventricular diastolic parameters are consistent with Grade I diastolic dysfunction (impaired relaxation). Indeterminate filling pressures. Right Ventricle: The right ventricular size is normal. No increase in right ventricular wall thickness. Right ventricular systolic function is normal. Left Atrium: Left atrial size was normal in size. Right Atrium:  Right atrial size was  normal in size. Pericardium: Trivial pericardial effusion is present. The pericardial effusion is localized near the right ventricle. Mitral Valve: The mitral valve is grossly normal. No evidence of mitral valve regurgitation. Tricuspid Valve: The tricuspid valve is grossly normal. Tricuspid valve regurgitation is trivial. Aortic Valve: The aortic valve is tricuspid. Aortic valve regurgitation is not visualized. Aortic valve sclerosis is present, with no evidence of aortic valve stenosis. Pulmonic Valve: The pulmonic valve was not well visualized. Pulmonic valve regurgitation is not visualized. Aorta: Aortic dilatation noted. There is mild dilatation of the aortic root, measuring 40 mm. Venous: The inferior vena cava is normal in size with greater than 50% respiratory variability, suggesting right atrial pressure of 3 mmHg. IAS/Shunts: No atrial level shunt detected by color flow Doppler.  LEFT VENTRICLE PLAX 2D LV EF:         Left            Diastology                ventricular     LV e' medial:    5.00 cm/s                ejection        LV E/e' medial:  9.9                fraction by     LV e' lateral:   8.38 cm/s                PLAX is 54      LV E/e' lateral: 5.9                %. LVIDd:         4.30 cm LVIDs:         3.10 cm LV PW:         1.10 cm LV IVS:        0.60 cm LVOT diam:     2.20 cm LV SV:         37 LV SV Index:   18 LVOT Area:     3.80 cm  LEFT ATRIUM           Index       RIGHT ATRIUM           Index LA diam:      2.30 cm 1.09 cm/m  RA Area:     13.50 cm LA Vol (A4C): 13.0 ml 6.18 ml/m  RA Volume:   26.80 ml  12.74 ml/m  AORTIC VALVE LVOT Vmax:   61.10 cm/s LVOT Vmean:  39.100 cm/s LVOT VTI:    0.098 m  AORTA Ao Root diam: 3.85 cm Ao Asc diam:  4.00 cm MITRAL VALVE MV Area (PHT): 3.63 cm    SHUNTS MV Decel Time: 209 msec    Systemic VTI:  0.10 m MV E velocity: 49.70 cm/s  Systemic Diam: 2.20 cm MV Faylene Allerton velocity: 57.60 cm/s MV E/Kerney Hopfensperger ratio:  0.86 Zoila Shutter MD Electronically signed by Zoila Shutter MD Signature Date/Time: 11/01/2022/12:40:39 PM    Final         Scheduled Meds:  atorvastatin  20 mg Oral QPM   carbidopa-levodopa  2 tablet Oral TID WC   enoxaparin (LOVENOX) injection  40 mg Subcutaneous Q24H   [START ON 11/04/2022] escitalopram  10 mg Oral Daily   loratadine  10 mg Oral Daily   midodrine  10 mg Oral TID WC  polyethylene glycol  17 g Oral BID   traZODone  100 mg Oral QHS   Continuous Infusions:   LOS: 12 days    Time spent: over 30 min    Lacretia Nicks, MD [facility]   To contact the attending provider between 7A-7P or the covering provider during after hours 7P-7A, please log into the web site [email or web address] and access using universal [place] password for that web site. If you do not have the password, please call the hospital operator.  11/03/2022, 10:41 AM

## 2022-11-03 NOTE — Progress Notes (Signed)
Called PT per family request. L V/M.

## 2022-11-03 NOTE — Progress Notes (Signed)
   11/03/22 1122  Orthostatic Lying   BP- Lying 115/73  Pulse- Lying 79  Orthostatic Sitting  BP- Sitting 101/69  Pulse- Sitting 80  Orthostatic Standing at 0 minutes  BP- Standing at 0 minutes 103/73 (could not tolerate standing)  Pulse- Standing at 0 minutes 81

## 2022-11-04 DIAGNOSIS — E785 Hyperlipidemia, unspecified: Secondary | ICD-10-CM | POA: Diagnosis not present

## 2022-11-04 DIAGNOSIS — F03918 Unspecified dementia, unspecified severity, with other behavioral disturbance: Secondary | ICD-10-CM | POA: Diagnosis not present

## 2022-11-04 DIAGNOSIS — G3183 Dementia with Lewy bodies: Secondary | ICD-10-CM | POA: Diagnosis not present

## 2022-11-04 DIAGNOSIS — K5730 Diverticulosis of large intestine without perforation or abscess without bleeding: Secondary | ICD-10-CM | POA: Diagnosis not present

## 2022-11-04 DIAGNOSIS — N400 Enlarged prostate without lower urinary tract symptoms: Secondary | ICD-10-CM | POA: Diagnosis not present

## 2022-11-04 DIAGNOSIS — N390 Urinary tract infection, site not specified: Secondary | ICD-10-CM | POA: Diagnosis not present

## 2022-11-04 DIAGNOSIS — R4189 Other symptoms and signs involving cognitive functions and awareness: Secondary | ICD-10-CM | POA: Diagnosis not present

## 2022-11-04 DIAGNOSIS — F0390 Unspecified dementia without behavioral disturbance: Secondary | ICD-10-CM | POA: Diagnosis not present

## 2022-11-04 DIAGNOSIS — M6281 Muscle weakness (generalized): Secondary | ICD-10-CM | POA: Diagnosis not present

## 2022-11-04 DIAGNOSIS — A419 Sepsis, unspecified organism: Secondary | ICD-10-CM | POA: Diagnosis not present

## 2022-11-04 DIAGNOSIS — R634 Abnormal weight loss: Secondary | ICD-10-CM | POA: Diagnosis not present

## 2022-11-04 DIAGNOSIS — G4720 Circadian rhythm sleep disorder, unspecified type: Secondary | ICD-10-CM | POA: Diagnosis not present

## 2022-11-04 DIAGNOSIS — D649 Anemia, unspecified: Secondary | ICD-10-CM | POA: Diagnosis not present

## 2022-11-04 DIAGNOSIS — R262 Difficulty in walking, not elsewhere classified: Secondary | ICD-10-CM | POA: Diagnosis not present

## 2022-11-04 DIAGNOSIS — G20C Parkinsonism, unspecified: Secondary | ICD-10-CM | POA: Diagnosis not present

## 2022-11-04 DIAGNOSIS — R531 Weakness: Secondary | ICD-10-CM | POA: Diagnosis not present

## 2022-11-04 DIAGNOSIS — R41841 Cognitive communication deficit: Secondary | ICD-10-CM | POA: Diagnosis not present

## 2022-11-04 DIAGNOSIS — I951 Orthostatic hypotension: Secondary | ICD-10-CM | POA: Diagnosis not present

## 2022-11-04 DIAGNOSIS — Z9181 History of falling: Secondary | ICD-10-CM | POA: Diagnosis not present

## 2022-11-04 DIAGNOSIS — G20A1 Parkinson's disease without dyskinesia, without mention of fluctuations: Secondary | ICD-10-CM | POA: Diagnosis not present

## 2022-11-04 DIAGNOSIS — F419 Anxiety disorder, unspecified: Secondary | ICD-10-CM | POA: Diagnosis not present

## 2022-11-04 DIAGNOSIS — E782 Mixed hyperlipidemia: Secondary | ICD-10-CM | POA: Diagnosis not present

## 2022-11-04 DIAGNOSIS — S0990XA Unspecified injury of head, initial encounter: Secondary | ICD-10-CM | POA: Diagnosis not present

## 2022-11-04 DIAGNOSIS — R1312 Dysphagia, oropharyngeal phase: Secondary | ICD-10-CM | POA: Diagnosis not present

## 2022-11-04 DIAGNOSIS — G4700 Insomnia, unspecified: Secondary | ICD-10-CM | POA: Diagnosis not present

## 2022-11-04 DIAGNOSIS — W19XXXA Unspecified fall, initial encounter: Secondary | ICD-10-CM | POA: Diagnosis not present

## 2022-11-04 DIAGNOSIS — R2689 Other abnormalities of gait and mobility: Secondary | ICD-10-CM | POA: Diagnosis not present

## 2022-11-04 DIAGNOSIS — Z515 Encounter for palliative care: Secondary | ICD-10-CM | POA: Diagnosis not present

## 2022-11-04 LAB — ACTH STIMULATION, 3 TIME POINTS
Cortisol, 30 Min: 31.1 ug/dL
Cortisol, 60 Min: 36.9 ug/dL
Cortisol, Base: 14.6 ug/dL

## 2022-11-04 LAB — BASIC METABOLIC PANEL
Anion gap: 7 (ref 5–15)
BUN: 17 mg/dL (ref 8–23)
CO2: 21 mmol/L — ABNORMAL LOW (ref 22–32)
Calcium: 8.9 mg/dL (ref 8.9–10.3)
Chloride: 104 mmol/L (ref 98–111)
Creatinine, Ser: 0.71 mg/dL (ref 0.61–1.24)
GFR, Estimated: >60 mL/min (ref >60)
Glucose, Bld: 89 mg/dL (ref 70–99)
Potassium: 3.8 mmol/L (ref 3.5–5.1)
Sodium: 132 mmol/L — ABNORMAL LOW (ref 135–145)

## 2022-11-04 LAB — CBC
HCT: 31.1 % — ABNORMAL LOW (ref 39.0–52.0)
Hemoglobin: 10.1 g/dL — ABNORMAL LOW (ref 13.0–17.0)
MCH: 21.1 pg — ABNORMAL LOW (ref 26.0–34.0)
MCHC: 32.5 g/dL (ref 30.0–36.0)
MCV: 64.9 fL — ABNORMAL LOW (ref 80.0–100.0)
Platelets: 338 10*3/uL (ref 150–400)
RBC: 4.79 MIL/uL (ref 4.22–5.81)
RDW: 14.9 % (ref 11.5–15.5)
WBC: 5.2 10*3/uL (ref 4.0–10.5)
nRBC: 0 % (ref 0.0–0.2)

## 2022-11-04 LAB — MAGNESIUM: Magnesium: 1.4 mg/dL — ABNORMAL LOW (ref 1.7–2.4)

## 2022-11-04 LAB — PHOSPHORUS: Phosphorus: 3.4 mg/dL (ref 2.5–4.6)

## 2022-11-04 MED ORDER — ESCITALOPRAM OXALATE 10 MG PO TABS: 10.0000 mg | ORAL_TABLET | Freq: Every day | ORAL | Status: AC

## 2022-11-04 MED ORDER — LORAZEPAM 0.5 MG PO TABS: 0.5000 mg | ORAL_TABLET | Freq: Every evening | ORAL | 0 refills | Status: AC | PRN

## 2022-11-04 MED ORDER — MIDODRINE HCL 10 MG PO TABS: 10.0000 mg | ORAL_TABLET | Freq: Three times a day (TID) | ORAL | 0 refills | Status: AC

## 2022-11-04 MED ORDER — CARBIDOPA-LEVODOPA 25-100 MG PO TABS: 1.0000 | ORAL_TABLET | Freq: Three times a day (TID) | ORAL | Status: AC

## 2022-11-04 MED ORDER — MAGNESIUM SULFATE 2 GM/50ML IV SOLN
2.0000 g | Freq: Once | INTRAVENOUS | Status: AC
  Administered 2022-11-04: 2 g via INTRAVENOUS
  Filled 2022-11-04: qty 50

## 2022-11-04 NOTE — Progress Notes (Signed)
Tony [name] to be discharged [facility] per MD order. Patient verbalized understanding. Report given to staff nurse [name]) at facility and answered all her questions.  Skin clean, dry and intact without evidence of skin break down, no evidence of skin tears noted. IV catheter discontinued intact. Site without signs and symptoms of complications. Dressing and pressure applied. Pt denies pain at the site currently. No complaints noted.  Patient free of lines, drains, and wounds.   Discharge packet assembled. An After Visit Summary (AVS) was printed and given to the EMS personnel. Patient escorted via stretcher and discharged to [name] via ambulance. Report called to accepting facility; all questions and concerns addressed.   [name], RN

## 2022-11-04 NOTE — Discharge Summary (Signed)
Physician Discharge Summary  Tony [name] PPI:[id number] DOB: 1945-04-19 DOA: 10/21/2022  PCP: Chilton Greathouse, MD  Admit date: 10/21/2022 Discharge date: 11/04/2022  Time spent: 40 minutes  Recommendations for Outpatient Follow-up:  Follow outpatient CBC/CMP  Follow orthostasis outpatient - at discharge, reducing sinemet to 1 tab TID.  We've also stopped flomax, halved lexapro, started midodrine, and started abdominal binder and compression stockings.  Head of bed to 45 degrees at night.  Follow outpatient with neurology.  Continue goals of care discussions, seen by palliative (follow up most form and goals of care, note pending)   Discharge Diagnoses:  Principal Problem:   Sepsis secondary to gram negative rod bacteremia due to UTI [facility]) Active Problems:   HLD (hyperlipidemia)   Parkinson's disease   Hyponatremia   Normocytic anemia   Discharge Condition: stable  Diet recommendation: regular  Filed Weights   10/21/22 1849  Weight: 88 kg    History of present illness:   77 year old with Parkinson's, cognitive impairment, left hemineglect, dyspraxia, hyperlipidemia and anxiety presented with altered mental status and weakness.  Could not walk with Ella Guillotte walker.  He lives in assisted living facility at [facility].  Admitted with acute UTI , temperature 103, tachycardic, WBC count 18.  Found to have E. coli bacteremia.  He's completed antibiotic course.  Hospitalization complicated by orthostatic hypotension.  Adjustments as noted below, he continues to have limiting orthostasis at discharge, this should be followed outpatient.    Hospital Course:  Assessment and Plan:  Sepsis present on admission secondary to E. coli UTI and bacteremia.  Presented with fever, tachycardia and leukocytosis. Sepsis physiology improving. Blood cultures with E. coli no resistance.  Unfortunately urine cultures were not collected. Completed course of antibiotics Renal ultrasound did not show any  hydronephrosis or urine retention.  Does have prostatomegaly.  No significant postvoid residuals.   Orthostatic hypotension:  Remains orthostatic 9/5 Continues to have orthostasis today, follow outpatient Sleep with head of bed elevated to 45 degrees Should wear thigh high compression stockings and abdominal binder when upright  Could be related to parkinoson's disease (most likely) or medications or other causes Midodrine Cortisol wnl  TSH wnl Flomax is on hold Reducing SSRI (lexapro to 10 mg) - follow outpatient I discussed with his neurologist and have reduced his sinemet to 1 tablet TID - would continue to follow outpatient Echo as below - EF 50-55%, grade 1 diastolic dysfunction Tilt bed ordered   BPH Holding flomax, follow bladder scans   Chronic medical issues including Parkinson's, stable on Sinemet Hyperlipidemia, on statin   Goals of care Appreciate palliative assistance -> they completed most form (hasn't been uploaded yet and note is pending so I'm not able to see this)  Debility deconditioning: Significant debility and deconditioning.  Recommending SNF.  Medically stable.    Procedures: Echo IMPRESSIONS     1. Left ventricular ejection fraction, by estimation, is 50 to 55%. Left  ventricular ejection fraction by PLAX is 54 %. The left ventricle has low  normal function. The left ventricle has no regional wall motion  abnormalities. There is mild left  ventricular hypertrophy. Left ventricular diastolic parameters are  consistent with Grade I diastolic dysfunction (impaired relaxation).   2. Right ventricular systolic function is normal. The right ventricular  size is normal.   3. The mitral valve is grossly normal. No evidence of mitral valve  regurgitation.   4. The aortic valve is tricuspid. Aortic valve regurgitation is not  visualized. Aortic valve sclerosis is  present, with no evidence of aortic  valve stenosis.   5. Aortic dilatation noted. There is  mild dilatation of the aortic root,  measuring 40 mm.   6. The inferior vena cava is normal in size with greater than 50%  respiratory variability, suggesting right atrial pressure of 3 mmHg.   Comparison(s): No prior Echocardiogram.    Consultations: none  Discharge Exam: Vitals:   11/04/22 0551 11/04/22 0917  BP: 108/68 117/70  Pulse: 65 73  Resp: 16 18  Temp: 98.2 F (36.8 C) 98.2 F (36.8 C)  SpO2: (!) 89% 96%   No complaints Discussed with wife over phone  General: No acute distress. Sitting in wheelchair with therapy.  C/o lightheadedness with standing. Lungs: unlabored Neurological: Alert. Moves all extremities 4 with equal strength. Cranial nerves II through XII grossly intact. Extremities: No clubbing or cyanosis. No edema.   Discharge Instructions   Discharge Instructions     Call MD for:  difficulty breathing, headache or visual disturbances   Complete by: As directed    Call MD for:  extreme fatigue   Complete by: As directed    Call MD for:  hives   Complete by: As directed    Call MD for:  persistant dizziness or light-headedness   Complete by: As directed    Call MD for:  persistant nausea and vomiting   Complete by: As directed    Call MD for:  redness, tenderness, or signs of infection (pain, swelling, redness, odor or green/yellow discharge around incision site)   Complete by: As directed    Call MD for:  severe uncontrolled pain   Complete by: As directed    Call MD for:  temperature >100.4   Complete by: As directed    Diet - low sodium heart healthy   Complete by: As directed    Discharge instructions   Complete by: As directed    You were seen for bacteremia (bacteria in your blood) from [facility] Upshaw urinary tract infection.  This has been treated with antibiotics.  Your hospitalization has been complicated by orthostatic hypotension (Halona Amstutz drop in your blood pressure with standing).  This could be related to your parkinson's disease.  We've stopped  your flomax which can contribute.  We've halved your lexapro, which can contribute.  We've started you on midodrine to keep your blood pressure up.  You continue to have limiting symptoms.  We've reduced his sinemet to 1 tablet 3 times daily after discussing with his neurologist.  You should follow with your outpatient neurologist after discharge.  Return for new, recurrent, or worsening symptoms.  Please ask your PCP to request records from this hospitalization so they know what was done and what the next steps will be.   Increase activity slowly   Complete by: As directed       Allergies as of 11/04/2022       Reactions   Shellfish Allergy Hives, Swelling   Other Reaction(s): Unknown   Shellfish-derived Products Hives, Swelling        Medication List     STOP taking these medications    diclofenac Sodium 1 % Gel Commonly known as: Voltaren Arthritis Pain   donepezil 10 MG tablet Commonly known as: ARICEPT   lidocaine 4 %   lidocaine 5 % Commonly known as: LIDODERM       TAKE these medications    acetaminophen 325 MG tablet Commonly known as: Tylenol Take 2 tablets (650 mg total) by mouth every  6 (six) hours as needed.   atorvastatin 20 MG tablet Commonly known as: LIPITOR Take 20 mg by mouth every evening.   carbidopa-levodopa 25-100 MG tablet Commonly known as: SINEMET IR Take 1 tablet by mouth 3 (three) times daily. What changed: how much to take   CertaVite Senior/Antioxidant Tabs Take 1 tablet by mouth daily with breakfast.   cetirizine 10 MG tablet Commonly known as: ZYRTEC Take 10 mg by mouth daily.   escitalopram 10 MG tablet Commonly known as: LEXAPRO Take 1 tablet (10 mg total) by mouth daily. Dose reduced due to orthostasis, follow outpatient with PCP and adjust further as needed Start taking on: November 05, 2022 What changed:  medication strength how much to take additional instructions   LORazepam 0.5 MG tablet Commonly known as:  ATIVAN Take 1 tablet (0.5 mg total) by mouth at bedtime as needed for up to 3 days for anxiety.   melatonin 5 MG Tabs Take 5 mg by mouth at bedtime.   midodrine 10 MG tablet Commonly known as: PROAMATINE Take 1 tablet (10 mg total) by mouth 3 (three) times daily with meals.   polyethylene glycol 17 g packet Commonly known as: MIRALAX / GLYCOLAX Take 17 g by mouth daily. Titrate to 1 soft bowel movement daily   traZODone 50 MG tablet Commonly known as: DESYREL Take 50 mg by mouth at bedtime.       Allergies  Allergen Reactions   Shellfish Allergy Hives and Swelling    Other Reaction(s): Unknown   Shellfish-Derived Products Hives and Swelling    Contact information for follow-up providers     Avva, Ravisankar, MD Follow up.   Specialty: Internal Medicine Contact information: [address] [place] [address] [phone]         Levert Feinstein, MD Follow up.   Specialty: Neurology Why: call for Adley Castello follow up appointment Contact information: [address] [place] [address] [phone]              Contact information for after-discharge care     Destination     [facility] .   Service: Skilled Nursing Contact information: [address] Test Update Address [place] [address] [phone]                      The results of significant diagnostics from this hospitalization (including imaging, microbiology, ancillary and laboratory) are listed below for reference.    Significant Diagnostic Studies: ECHOCARDIOGRAM COMPLETE  Result Date: 11/01/2022    ECHOCARDIOGRAM REPORT   Patient Name:   [facility] Date of Exam: 11/01/2022 Medical Rec #:  [id number]     Height:       72.0 in Accession #:    [id number]    Weight:       194.0 lb Date of Birth:  05/22/1945     BSA:          2.103 m Patient Age:    77 years      BP:           114/72 mmHg Patient Gender: M             HR:           76 bpm. Exam  Location:  Inpatient Procedure: 2D Echo, Cardiac Doppler and Color Doppler Indications:    Other abnormalities of the heart R00.8  History:        Patient has no prior history of  Echocardiogram examinations.                 Sepsis; Risk Factors:Dyslipidemia and Non-Smoker.  Sonographer:    Alvie Heidelberg Referring Phys: [id number] Tahlia Deamer CALDWELL POWELL JR  Sonographer Comments: Suboptimal apical window and suboptimal subcostal window. IMPRESSIONS  1. Left ventricular ejection fraction, by estimation, is 50 to 55%. Left ventricular ejection fraction by PLAX is 54 %. The left ventricle has low normal function. The left ventricle has no regional wall motion abnormalities. There is mild left ventricular hypertrophy. Left ventricular diastolic parameters are consistent with Grade I diastolic dysfunction (impaired relaxation).  2. Right ventricular systolic function is normal. The right ventricular size is normal.  3. The mitral valve is grossly normal. No evidence of mitral valve regurgitation.  4. The aortic valve is tricuspid. Aortic valve regurgitation is not visualized. Aortic valve sclerosis is present, with no evidence of aortic valve stenosis.  5. Aortic dilatation noted. There is mild dilatation of the aortic root, measuring 40 mm.  6. The inferior vena cava is normal in size with greater than 50% respiratory variability, suggesting right atrial pressure of 3 mmHg. Comparison(s): No prior Echocardiogram. FINDINGS  Left Ventricle: Left ventricular ejection fraction, by estimation, is 50 to 55%. Left ventricular ejection fraction by PLAX is 54 %. The left ventricle has low normal function. The left ventricle has no regional wall motion abnormalities. The left ventricular internal cavity size was normal in size. There is mild left ventricular hypertrophy. Left ventricular diastolic parameters are consistent with Grade I diastolic dysfunction (impaired relaxation). Indeterminate filling pressures. Right Ventricle: The right  ventricular size is normal. No increase in right ventricular wall thickness. Right ventricular systolic function is normal. Left Atrium: Left atrial size was normal in size. Right Atrium: Right atrial size was normal in size. Pericardium: Trivial pericardial effusion is present. The pericardial effusion is localized near the right ventricle. Mitral Valve: The mitral valve is grossly normal. No evidence of mitral valve regurgitation. Tricuspid Valve: The tricuspid valve is grossly normal. Tricuspid valve regurgitation is trivial. Aortic Valve: The aortic valve is tricuspid. Aortic valve regurgitation is not visualized. Aortic valve sclerosis is present, with no evidence of aortic valve stenosis. Pulmonic Valve: The pulmonic valve was not well visualized. Pulmonic valve regurgitation is not visualized. Aorta: Aortic dilatation noted. There is mild dilatation of the aortic root, measuring 40 mm. Venous: The inferior vena cava is normal in size with greater than 50% respiratory variability, suggesting right atrial pressure of 3 mmHg. IAS/Shunts: No atrial level shunt detected by color flow Doppler.  LEFT VENTRICLE PLAX 2D LV EF:         Left            Diastology                ventricular     LV e' medial:    5.00 cm/s                ejection        LV E/e' medial:  9.9                fraction by     LV e' lateral:   8.38 cm/s                PLAX is 54      LV E/e' lateral: 5.9                %. LVIDd:  4.30 cm LVIDs:         3.10 cm LV PW:         1.10 cm LV IVS:        0.60 cm LVOT diam:     2.20 cm LV SV:         37 LV SV Index:   18 LVOT Area:     3.80 cm  LEFT ATRIUM           Index       RIGHT ATRIUM           Index LA diam:      2.30 cm 1.09 cm/m  RA Area:     13.50 cm LA Vol (A4C): 13.0 ml 6.18 ml/m  RA Volume:   26.80 ml  12.74 ml/m  AORTIC VALVE LVOT Vmax:   61.10 cm/s LVOT Vmean:  39.100 cm/s LVOT VTI:    0.098 m  AORTA Ao Root diam: 3.85 cm Ao Asc diam:  4.00 cm MITRAL VALVE MV Area (PHT): 3.63  cm    SHUNTS MV Decel Time: 209 msec    Systemic VTI:  0.10 m MV E velocity: 49.70 cm/s  Systemic Diam: 2.20 cm MV Kemiya Batdorf velocity: 57.60 cm/s MV E/Odai Wimmer ratio:  0.86 Zoila Shutter MD Electronically signed by Zoila Shutter MD Signature Date/Time: 11/01/2022/12:40:39 PM    Final    US RENAL  Result Date: 10/23/2022 CLINICAL DATA:  Retention of urine. EXAM: RENAL / URINARY TRACT ULTRASOUND COMPLETE COMPARISON:  CT of the abdomen and pelvis without contrast 08/05/2022 FINDINGS: Right Kidney: Renal measurements: 11.0 x 7.1 x 6 1 cm = volume: 274 mL. Echogenicity within normal limits. No mass or hydronephrosis visualized. Left Kidney: Renal measurements: 10.4 x 6.9 x 6.8 cm = volume: 254 ML. Echogenicity within normal limits. No mass or hydronephrosis visualized. Minimal perinephric fluid is noted. Bladder: Appears normal for degree of bladder distention. Other: Prostate is enlarged, measuring 6.5 x 5 3 x 5.0 cm. IMPRESSION: 1. No evidence of hydronephrosis.  No significant nephrolithiasis. 2. Minimal perinephric fluid on the left is nonspecific. 3. Enlarged prostate. Electronically Signed   By: Marin Roberts M.D.   On: 10/23/2022 16:28   DG Chest Portable 1 View  Result Date: 10/21/2022 CLINICAL DATA:  Fevers EXAM: PORTABLE CHEST 1 VIEW COMPARISON:  10/06/2022 FINDINGS: Cardiac shadow is stable. Aortic calcifications are again seen. Lungs are well aerated bilaterally. No focal infiltrate or effusion is seen. IMPRESSION: No acute abnormality noted. Electronically Signed   By: Alcide Clever M.D.   On: 10/21/2022 20:26   DG Chest Portable 1 View  Result Date: 10/06/2022 CLINICAL DATA:  Altered mental status EXAM: PORTABLE CHEST 1 VIEW COMPARISON:  Chest radiograph dated 09/05/2022 FINDINGS: Normal lung volumes. No focal consolidations. No pleural effusion or pneumothorax. The heart size and mediastinal contours are within normal limits. No acute osseous abnormality. IMPRESSION: No active disease. Electronically  Signed   By: Agustin Cree M.D.   On: 10/06/2022 20:47    Microbiology: No results found for this or any previous visit (from the past 240 hour(s)).   Labs: Basic Metabolic Panel: Recent Labs  Lab 10/31/22 1029 11/01/22 0643 11/02/22 0643 11/04/22 0609  NA 132* 133* 137 132*  K 3.9 4.2 3.9 3.8  CL 102 101 98 104  CO2 22 23 22  21*  GLUCOSE 104* 91 84 89  BUN 17 16 22 17   CREATININE 0.87 0.89 0.84 0.71  CALCIUM 8.9 9.3 9.3 8.9  MG  1.5* 2.1 1.7 1.4*  PHOS 3.1 3.5 3.8 3.4   Liver Function Tests: Recent Labs  Lab 10/31/22 1029 11/01/22 0643  AST 14* 13*  ALT 8 24  ALKPHOS 75 77  BILITOT 0.6 0.7  PROT 7.2 7.2  ALBUMIN 3.4* 3.4*   No results for input(s): "LIPASE", "AMYLASE" in the last 168 hours. No results for input(s): "AMMONIA" in the last 168 hours. CBC: Recent Labs  Lab 10/31/22 1029 11/01/22 0643 11/02/22 0643 11/04/22 0609  WBC 5.2 5.3 5.8 5.2  NEUTROABS 3.4 3.1  --   --   HGB 10.2* 10.7* 10.3* 10.1*  HCT 31.4* 33.3* 31.7* 31.1*  MCV 64.6* 65.7* 65.2* 64.9*  PLT 336 292 294 338   Cardiac Enzymes: No results for input(s): "CKTOTAL", "CKMB", "CKMBINDEX", "TROPONINI" in the last 168 hours. BNP: BNP (last 3 results) No results for input(s): "BNP" in the last 8760 hours.  ProBNP (last 3 results) No results for input(s): "PROBNP" in the last 8760 hours.  CBG: No results for input(s): "GLUCAP" in the last 168 hours.     Signed:  Lacretia Nicks MD.  [facility] 11/04/2022, 1:44 PM

## 2022-11-04 NOTE — Progress Notes (Signed)
Patient ID: Tony [name], male   DOB: 26-Jan-1946, 77 y.o.   MRN: [id number]    Progress Note from the Palliative Medicine Team at [facility]   Patient Name: Tony [name]        Date: 11/04/2022 DOB: Jul 09, 1945  Age: 77 y.o. MRN#: [id number] Attending Physician: Tony [name]., * Primary Care Physician: Tony Greathouse, MD Admit Date: 10/21/2022   Reason for Consultation/Follow-up   Establishing goals of care   HPI/ Brief Hospital Review  77 y.o. male  with past medical history of Parkinson's, cognitive impairment, left hemineglect, dyspraxia, hyperlipidemia and anxiety admitted on 10/21/2022 from [facility] assisted living facility with altered mental status and weakness.    He was admitted with acute UTI. Since hospitalization he has had significant orthostatic hypotension.    Patient has had continued physical, functional decline over the past several months  Subjective  Extensive chart review has been completed prior to meeting with patient/family  including labs, vital signs, imaging, progress/consult notes, orders, medications and available advance directive documents.    This NP assessed patient at the bedside as a follow up for palliative medicine needs and emotional support.  Education offered on patient's multiple comorbidities we spoke specifically to the natural trajectory of Parkinson's disease as it relates to cognitive impairment, immobility, and overall failure to thrive.  Education offered on the difference between a full medical support path and a palliative comfort path for this patient at this time in this situation.  Education offered on hospice benefit;  Philosophy and eligibility    Plan  of care; - DNR/DNI - no artifical feeding now or in the future -  SNF for short term rehab -recommend OP palliative services at facility    MOST form completed and scanned for EMR  Education offered today regarding  the importance of continued  conversation with family and their  medical providers regarding overall plan of care and treatment options,  ensuring decisions are within the context of the patients values and GOCs.  Questions and concerns addressed   Discussed with primary team and nursing staff   Time: 50   minutes  Detailed review of medical records ( labs, imaging, vital signs), medically appropriate exam ( MS, skin, cardia,  resp)   discussed with treatment team, counseling and education to patient, family, staff, documenting clinical information, medication management, coordination of care    Tony Creed NP  Palliative Medicine Team Team Phone # [phone] Pager [phone]

## 2022-11-04 NOTE — Progress Notes (Signed)
Physical Therapy Treatment Patient Details Name: Tony [name] MRN: [id number] DOB: 12/19/1945 Today's Date: 11/04/2022   History of Present Illness 77 y.o. male who presented 10/21/22 with altered mental status and weakness. 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 Pt with consistent posterior lean with transfer attempts, resulting in a high risk for falls. Orthostatic BP documented in general comments of note, pt reports intermittent lightheadedness during session. Pt will benefit from continued acute PT services in an effort to improve mobility quality and to reduce falls risk and caregiver burden.    If plan is discharge home, recommend the following: Two people to help with walking and/or transfers;Assistance with feeding;Direct supervision/assist for medications management;Direct supervision/assist for financial management;Assist for transportation;Supervision due to cognitive status   Can travel by private vehicle     No  Equipment Recommendations  Wheelchair (measurements PT)    Recommendations for Other Services       Precautions / Restrictions Precautions Precautions: Fall Precaution Comments: orthostatic hypotension, L hemineglect, freezing episodes Required Braces or Orthoses: Other Brace Other Brace: bilateral compression stockings and abdominal binder Restrictions Weight Bearing Restrictions: No     Mobility  Bed Mobility Overal bed mobility: Needs Assistance Bed Mobility: Rolling, Supine to Sit, Sit to Supine Rolling: Mod assist   Supine to sit: Min assist, Used rails Sit to supine: Mod assist        Transfers Overall transfer level: Needs assistance Equipment used: Rolling walker (2 wheels), 1 person hand held  assist Transfers: Sit to/from Stand, Bed to chair/wheelchair/BSC Sit to Stand: Mod assist   Step pivot transfers: Max assist       General transfer comment: pt with posterior lean with standing attempts, poor initiation of trunk flexion despite PT verbal /visual and tactile cues. Pt with poor sequencing and initiation of stepping to pivot, requires PT weight shift in order to step    Ambulation/Gait Ambulation/Gait assistance:  (deferred due to high falls risk)                 Psychologist, counselling mobility: Yes Wheelchair propulsion: Right upper extremity, Both lower extermities Wheelchair parts: Needs assistance Distance: 20 Wheelchair Assistance Details (indicate cue type and reason): assist for brakes and intermittently with turns   Tilt Bed    Modified Rankin (Stroke Patients Only)       Balance Overall balance assessment: Needs assistance Sitting-balance support: No upper extremity supported, Feet supported Sitting balance-Leahy Scale: Fair     Standing balance support: Bilateral upper extremity supported, Reliant on assistive device for balance Standing balance-Leahy Scale: Poor Standing balance comment: modA, posterior lean                            Cognition Arousal: Alert Behavior During Therapy: Flat affect Overall Cognitive Status: Impaired/Different from baseline Area of Impairment: Orientation, Attention, Memory, Following commands, Safety/judgement, Awareness, Problem solving                 Orientation Level: Disoriented to, Time Current Attention Level: Focused Memory: Decreased recall of precautions, Decreased short-term memory Following Commands: Follows one step commands with increased time, Follows multi-step commands inconsistently Safety/Judgement: Decreased awareness of  safety, Decreased awareness of deficits Awareness: Intellectual Problem Solving: Slow  processing, Decreased initiation, Difficulty sequencing          Exercises      General Comments General comments (skin integrity, edema, etc.): BP pre-mobility in supine 124/69, sitting 98/79. abdominal binder placed subsequently. BP after transfer 98/85, later 97/61 in standing. Pt does report mild lightheadedness in sitting/standing during session.      Pertinent Vitals/Pain Pain Assessment Pain Assessment: No/denies pain    Home Living                          Prior Function            PT Goals (current goals can now be found in the care plan section) Acute Rehab PT Goals Patient Stated Goal: to get out of the hospital Progress towards PT goals: Progressing toward goals    Frequency    Min 1X/week      PT Plan      Co-evaluation              AM-PAC PT "6 Clicks" Mobility   Outcome Measure  Help needed turning from your back to your side while in a flat bed without using bedrails?: A Lot Help needed moving from lying on your back to sitting on the side of a flat bed without using bedrails?: A Little Help needed moving to and from a bed to a chair (including a wheelchair)?: A Lot Help needed standing up from a chair using your arms (e.g., wheelchair or bedside chair)?: A Lot Help needed to walk in hospital room?: Total Help needed climbing 3-5 steps with a railing? : Total 6 Click Score: 11    End of Session Equipment Utilized During Treatment: Gait belt Activity Tolerance: Patient tolerated treatment well Patient left: in bed;with call bell/phone within reach;with family/visitor present Nurse Communication: Mobility status;Need for lift equipment PT Visit Diagnosis: Unsteadiness on feet (R26.81);Other abnormalities of gait and mobility (R26.89);History of falling (Z91.81);Muscle weakness (generalized) (M62.81)     Time: 6578-4696 PT Time Calculation (min) (ACUTE ONLY): 88 min  Charges:    $Therapeutic Activity: 53-67 mins $Wheel  Chair Management: 23-37 mins PT General Charges $$ ACUTE PT VISIT: 1 Visit                     Arlyss Gandy, PT, DPT Acute Rehabilitation Office [phone]    Arlyss Gandy 11/04/2022, 11:09 AM

## 2022-11-07 DIAGNOSIS — E782 Mixed hyperlipidemia: Secondary | ICD-10-CM | POA: Diagnosis not present

## 2022-11-07 DIAGNOSIS — R531 Weakness: Secondary | ICD-10-CM | POA: Diagnosis not present

## 2022-11-07 DIAGNOSIS — G20A1 Parkinson's disease without dyskinesia, without mention of fluctuations: Secondary | ICD-10-CM | POA: Diagnosis not present

## 2022-11-07 DIAGNOSIS — D649 Anemia, unspecified: Secondary | ICD-10-CM | POA: Diagnosis not present

## 2022-11-11 ENCOUNTER — Telehealth: Payer: Self-pay | Admitting: Neurology

## 2022-11-11 DIAGNOSIS — R262 Difficulty in walking, not elsewhere classified: Secondary | ICD-10-CM

## 2022-11-11 DIAGNOSIS — Z9181 History of falling: Secondary | ICD-10-CM

## 2022-11-11 DIAGNOSIS — N400 Enlarged prostate without lower urinary tract symptoms: Secondary | ICD-10-CM | POA: Diagnosis not present

## 2022-11-11 DIAGNOSIS — R1312 Dysphagia, oropharyngeal phase: Secondary | ICD-10-CM | POA: Diagnosis not present

## 2022-11-11 DIAGNOSIS — G20A1 Parkinson's disease without dyskinesia, without mention of fluctuations: Secondary | ICD-10-CM | POA: Diagnosis not present

## 2022-11-11 DIAGNOSIS — G20C Parkinsonism, unspecified: Secondary | ICD-10-CM

## 2022-11-11 DIAGNOSIS — G3183 Dementia with Lewy bodies: Secondary | ICD-10-CM | POA: Diagnosis not present

## 2022-11-11 NOTE — Telephone Encounter (Signed)
Left msg for Tony kim np to call back

## 2022-11-11 NOTE — Telephone Encounter (Signed)
Pt's daughter is asking that [facility] be called, Selena Batten, NP there is wanting to evaluate pt's medications.  Pt's daughter does not have her direct # but the facility's main # is [phone]

## 2022-11-12 ENCOUNTER — Telehealth: Payer: Self-pay | Admitting: Neurology

## 2022-11-12 DIAGNOSIS — D649 Anemia, unspecified: Secondary | ICD-10-CM | POA: Diagnosis not present

## 2022-11-12 DIAGNOSIS — G4700 Insomnia, unspecified: Secondary | ICD-10-CM

## 2022-11-12 DIAGNOSIS — F419 Anxiety disorder, unspecified: Secondary | ICD-10-CM

## 2022-11-12 DIAGNOSIS — G20A1 Parkinson's disease without dyskinesia, without mention of fluctuations: Secondary | ICD-10-CM | POA: Diagnosis not present

## 2022-11-12 DIAGNOSIS — F0390 Unspecified dementia without behavioral disturbance: Secondary | ICD-10-CM

## 2022-11-12 DIAGNOSIS — R2689 Other abnormalities of gait and mobility: Secondary | ICD-10-CM | POA: Diagnosis not present

## 2022-11-12 DIAGNOSIS — Z9181 History of falling: Secondary | ICD-10-CM

## 2022-11-12 DIAGNOSIS — M6281 Muscle weakness (generalized): Secondary | ICD-10-CM | POA: Diagnosis not present

## 2022-11-12 NOTE — Telephone Encounter (Signed)
2nd attemt lvm for Tony [name]

## 2022-11-12 NOTE — Addendum Note (Signed)
Addended by: Danne Harbor on: 11/12/2022 10:33 AM   Modules accepted: Orders

## 2022-11-12 NOTE — Telephone Encounter (Signed)
[name], Tony returned phone call and would like a call to: Cell: [phone].

## 2022-11-12 NOTE — Telephone Encounter (Signed)
Np kim stated that pt is increase in agitation and the family brought that to them.   NP would like to start Nuplazid would like to start if provider here agreeable. However she doesn't have pa team or do pas.she would like for our practice to take care of it if possible. He will be there short term. She started lorazepam 0.5mg  q12 due to sundowning. She would like provider to weigh in. Family asked about klonopin but NP thinks this is more suited for neuro review.

## 2022-11-12 NOTE — Telephone Encounter (Signed)
LVM and sent mychart msg informing pt of need to reschedule 11/28/22 appt - NP out

## 2022-11-12 NOTE — Telephone Encounter (Signed)
Taking trazadone 100mg  nightly

## 2022-11-13 DIAGNOSIS — Z515 Encounter for palliative care: Secondary | ICD-10-CM | POA: Diagnosis not present

## 2022-11-13 NOTE — Telephone Encounter (Signed)
Left message on cell [phone].

## 2022-11-14 MED ORDER — NUPLAZID 34 MG PO CAPS: 34.0000 mg | ORAL_CAPSULE | Freq: Every day | ORAL | 5 refills | Status: AC

## 2022-11-14 NOTE — Telephone Encounter (Signed)
Pt's wife, Jaziyah Fauble want to know if you have any samples Nuplazid and Konopin. The medication he is on is not working. Need to find something he can tolerate. Would like a call back to discuss.

## 2022-11-14 NOTE — Addendum Note (Signed)
Addended by: Levert Feinstein on: 11/14/2022 05:09 PM   Modules accepted: Orders

## 2022-11-14 NOTE — Telephone Encounter (Signed)
Pimavanserin Tartrate (NUPLAZID) 34 MG CAPS 34 mg, Daily 5 ordered       Summary: Take 1 capsule (34 mg total) by mouth daily., Starting Thu 11/14/2022, Normal Dose, Route, Frequency: 34 mg, Oral, DailyStart: 09/19/2024Ord/Sold: 11/14/2022 (O)Ordered On: 09/19/2024Pharmacy: [id number] - [place], [place] - [address] Dx Associated: Taking: Long-term: Med Note:           Please let his wife know, we do not carry sample, I have Rx Nuplazid

## 2022-11-14 NOTE — Telephone Encounter (Signed)
1st attempt lvm

## 2022-11-21 DIAGNOSIS — G20A1 Parkinson's disease without dyskinesia, without mention of fluctuations: Secondary | ICD-10-CM | POA: Diagnosis not present

## 2022-11-21 DIAGNOSIS — Z9181 History of falling: Secondary | ICD-10-CM | POA: Diagnosis not present

## 2022-11-21 DIAGNOSIS — G4720 Circadian rhythm sleep disorder, unspecified type: Secondary | ICD-10-CM | POA: Diagnosis not present

## 2022-11-21 DIAGNOSIS — G20C Parkinsonism, unspecified: Secondary | ICD-10-CM | POA: Diagnosis not present

## 2022-11-21 DIAGNOSIS — Z515 Encounter for palliative care: Secondary | ICD-10-CM | POA: Diagnosis not present

## 2022-11-21 DIAGNOSIS — F03918 Unspecified dementia, unspecified severity, with other behavioral disturbance: Secondary | ICD-10-CM | POA: Diagnosis not present

## 2022-11-21 DIAGNOSIS — R1312 Dysphagia, oropharyngeal phase: Secondary | ICD-10-CM | POA: Diagnosis not present

## 2022-11-21 DIAGNOSIS — G3183 Dementia with Lewy bodies: Secondary | ICD-10-CM | POA: Diagnosis not present

## 2022-11-21 DIAGNOSIS — I951 Orthostatic hypotension: Secondary | ICD-10-CM | POA: Diagnosis not present

## 2022-11-21 DIAGNOSIS — R262 Difficulty in walking, not elsewhere classified: Secondary | ICD-10-CM | POA: Diagnosis not present

## 2022-11-21 DIAGNOSIS — R634 Abnormal weight loss: Secondary | ICD-10-CM | POA: Diagnosis not present

## 2022-11-28 ENCOUNTER — Ambulatory Visit: Payer: PPO | Admitting: Neurology

## 2022-11-28 NOTE — Telephone Encounter (Signed)
PA sent to plan for [facility] (Key: [id number])  Response received APPROVED  pa preview Tony [name] (Key: [id number]) [id number] Nuplazid 34MG  capsules status: PA Response - ApprovedCreated: September 30th, [id number] Sent: October 3rd, 2024  Patient notified via MyChart of approval

## 2022-11-28 NOTE — Telephone Encounter (Signed)
Pankti from [facility] reports that a PA is needed for pt's Nuplazid .  Cover My Meds Key is :[id number] The call back # for [facility] should there be questions is [phone]

## 2022-12-03 ENCOUNTER — Telehealth: Payer: Self-pay | Admitting: Neurology

## 2022-12-03 NOTE — Telephone Encounter (Signed)
Pt's daughter, Donivan Thammavong cancelled appt due to patient has been admitted to Hospice.

## 2022-12-05 ENCOUNTER — Ambulatory Visit: Payer: PPO | Admitting: Neurology

## 2022-12-09 DIAGNOSIS — F331 Major depressive disorder, recurrent, moderate: Secondary | ICD-10-CM | POA: Diagnosis not present

## 2022-12-09 DIAGNOSIS — G20B1 Parkinson's disease with dyskinesia, without mention of fluctuations: Secondary | ICD-10-CM | POA: Diagnosis not present

## 2022-12-09 DIAGNOSIS — G3183 Dementia with Lewy bodies: Secondary | ICD-10-CM | POA: Diagnosis not present

## 2022-12-09 DIAGNOSIS — F419 Anxiety disorder, unspecified: Secondary | ICD-10-CM | POA: Diagnosis not present

## 2022-12-11 DIAGNOSIS — G20A1 Parkinson's disease without dyskinesia, without mention of fluctuations: Secondary | ICD-10-CM | POA: Diagnosis not present

## 2022-12-13 DIAGNOSIS — G20A1 Parkinson's disease without dyskinesia, without mention of fluctuations: Secondary | ICD-10-CM | POA: Diagnosis not present

## 2022-12-14 DIAGNOSIS — G20A1 Parkinson's disease without dyskinesia, without mention of fluctuations: Secondary | ICD-10-CM | POA: Diagnosis not present

## 2022-12-23 DIAGNOSIS — G20B1 Parkinson's disease with dyskinesia, without mention of fluctuations: Secondary | ICD-10-CM | POA: Diagnosis not present

## 2022-12-23 DIAGNOSIS — F419 Anxiety disorder, unspecified: Secondary | ICD-10-CM | POA: Diagnosis not present

## 2022-12-23 DIAGNOSIS — G3183 Dementia with Lewy bodies: Secondary | ICD-10-CM | POA: Diagnosis not present

## 2022-12-23 DIAGNOSIS — R441 Visual hallucinations: Secondary | ICD-10-CM | POA: Diagnosis not present

## 2022-12-23 DIAGNOSIS — F331 Major depressive disorder, recurrent, moderate: Secondary | ICD-10-CM | POA: Diagnosis not present

## 2022-12-24 DIAGNOSIS — G20A1 Parkinson's disease without dyskinesia, without mention of fluctuations: Secondary | ICD-10-CM | POA: Diagnosis not present

## 2022-12-24 DIAGNOSIS — R1312 Dysphagia, oropharyngeal phase: Secondary | ICD-10-CM

## 2022-12-24 DIAGNOSIS — N400 Enlarged prostate without lower urinary tract symptoms: Secondary | ICD-10-CM | POA: Diagnosis not present

## 2022-12-24 DIAGNOSIS — G3183 Dementia with Lewy bodies: Secondary | ICD-10-CM | POA: Diagnosis not present

## 2022-12-24 DIAGNOSIS — R262 Difficulty in walking, not elsewhere classified: Secondary | ICD-10-CM

## 2022-12-24 DIAGNOSIS — Z9181 History of falling: Secondary | ICD-10-CM

## 2022-12-31 DIAGNOSIS — K5730 Diverticulosis of large intestine without perforation or abscess without bleeding: Secondary | ICD-10-CM | POA: Diagnosis not present

## 2022-12-31 DIAGNOSIS — D649 Anemia, unspecified: Secondary | ICD-10-CM | POA: Diagnosis not present

## 2023-01-02 DIAGNOSIS — F419 Anxiety disorder, unspecified: Secondary | ICD-10-CM

## 2023-01-02 DIAGNOSIS — G4700 Insomnia, unspecified: Secondary | ICD-10-CM | POA: Diagnosis not present

## 2023-01-02 DIAGNOSIS — G20A1 Parkinson's disease without dyskinesia, without mention of fluctuations: Secondary | ICD-10-CM | POA: Diagnosis not present

## 2023-01-02 DIAGNOSIS — R2689 Other abnormalities of gait and mobility: Secondary | ICD-10-CM

## 2023-01-02 DIAGNOSIS — M6281 Muscle weakness (generalized): Secondary | ICD-10-CM

## 2023-01-02 DIAGNOSIS — F0390 Unspecified dementia without behavioral disturbance: Secondary | ICD-10-CM | POA: Diagnosis not present

## 2023-01-02 DIAGNOSIS — D649 Anemia, unspecified: Secondary | ICD-10-CM | POA: Diagnosis not present

## 2023-02-26 DEATH — deceased

## 2023-04-29 IMAGING — NM NM DATSCAN
2 series · 24 of 30 positions shown · non-contrast
Comparison: Brain MRI 04/09/2020

CLINICAL DATA: 75-year-old male with unsteady gait memory loss.

EXAM:
NUCLEAR MEDICINE BRAIN IMAGING WITH SPECT  (DaTscan )
TECHNIQUE: SPECT images of the brain were obtained after intravenous injection
of radiopharmaceutical. 4 hour post injection imaging. Appropriate
positioning. 130 mg i-STAT given orally for thyroid blockade.
RADIOPHARMACEUTICALS:  4.9 millicuries I 123 Ioflupane

[Series 1: dat scan · 4.14mm/px · 5 of 120 frames shown]
[frame 11/120  full-range]
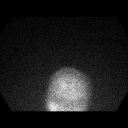
[frame 31/120  full-range]
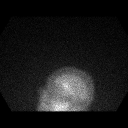
[frame 71/120  full-range]
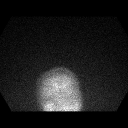
[frame 91/120  full-range]
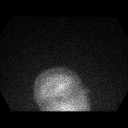
[frame 111/120  full-range]
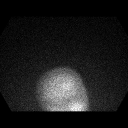

[Series 1016: mpr (id) range · 0.82mm/px · 19 of 34 slices shown]
[im 1/34]
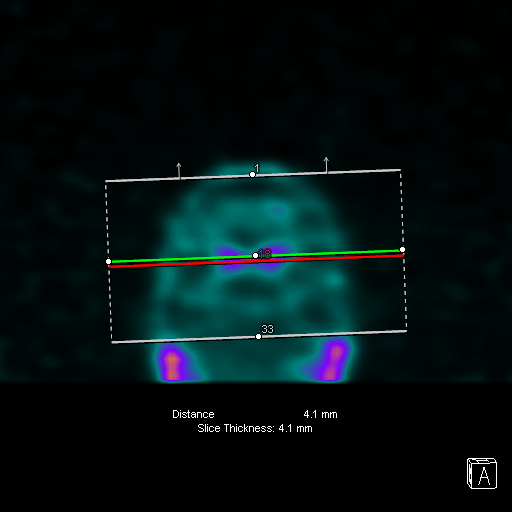
[im 3/34]
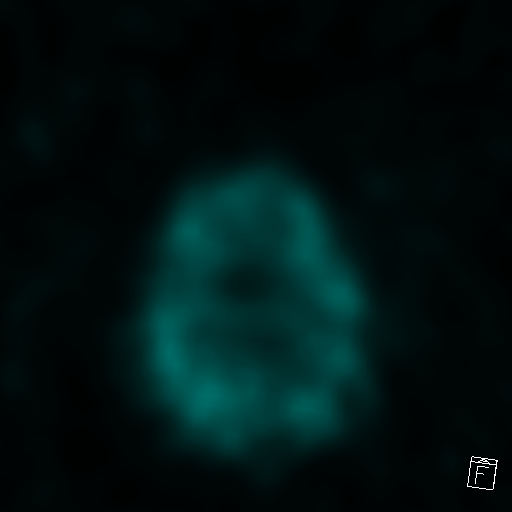
[im 5/34]
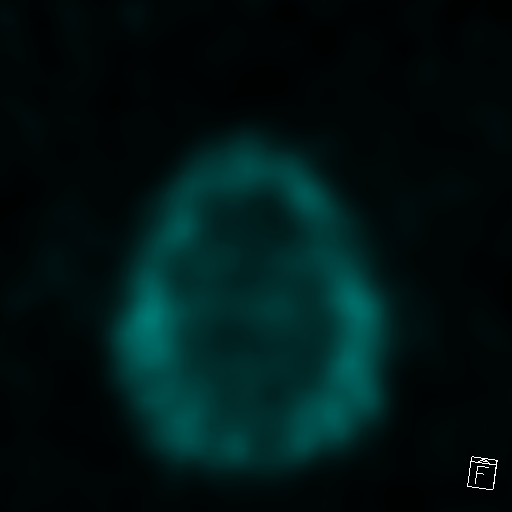
[im 6/34]
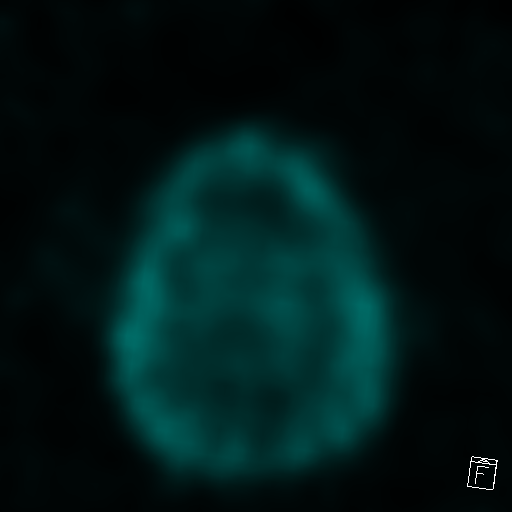
[im 8/34]
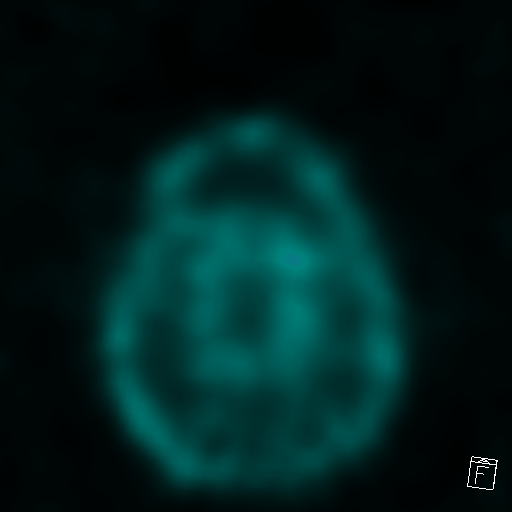
[im 11/34]
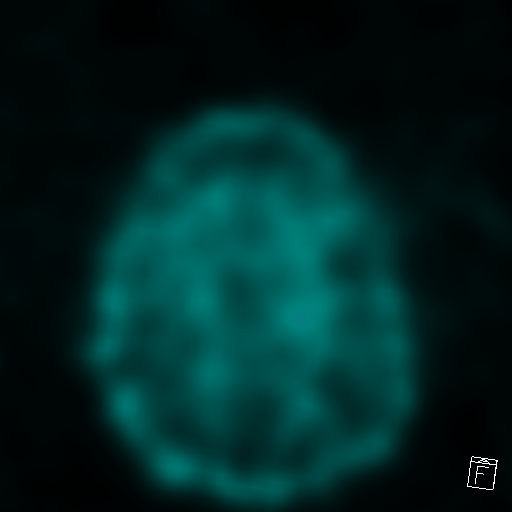
[im 12/34]
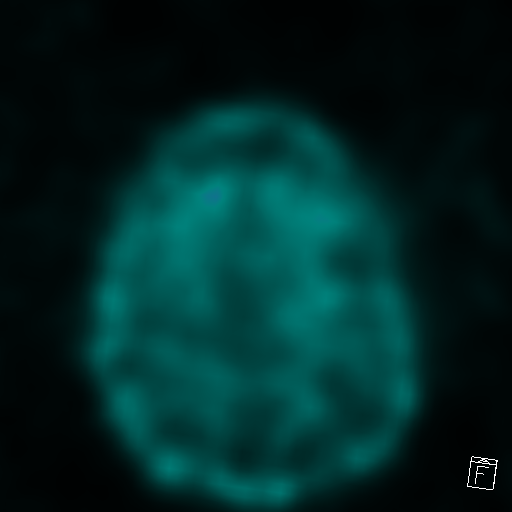
[im 13/34]
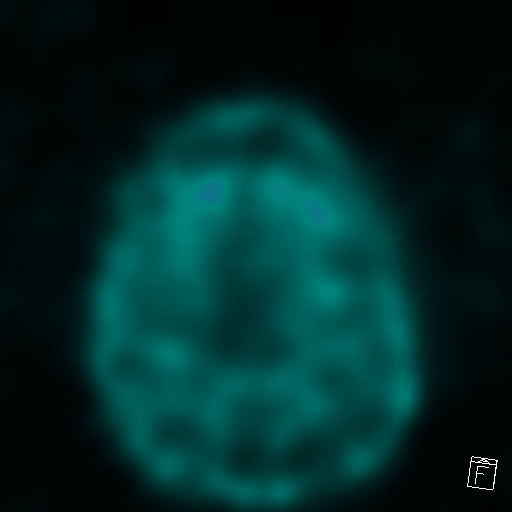
[im 15/34]
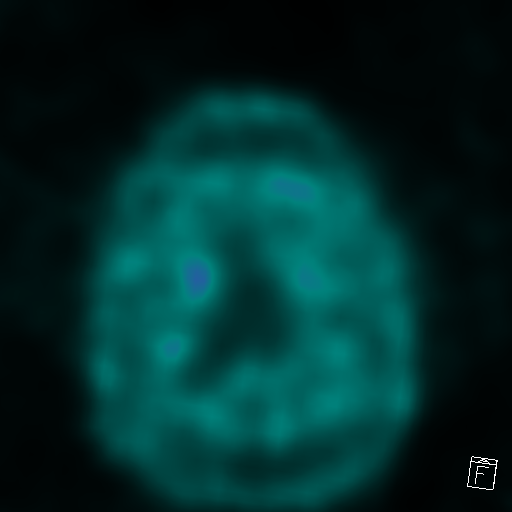
[im 18/34]
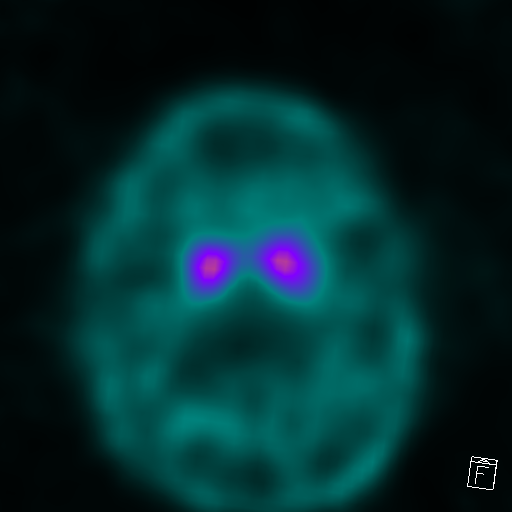
[im 19/34]
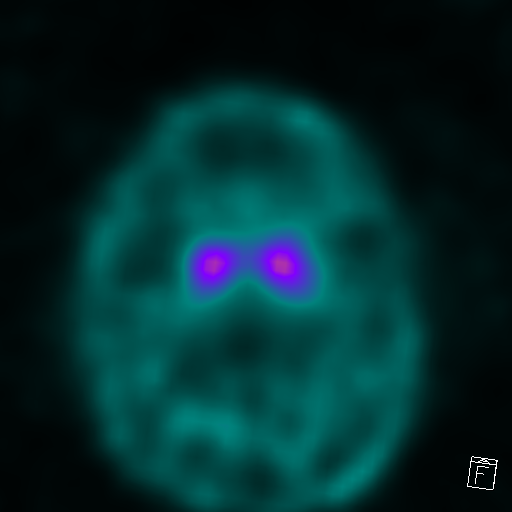
[im 21/34]
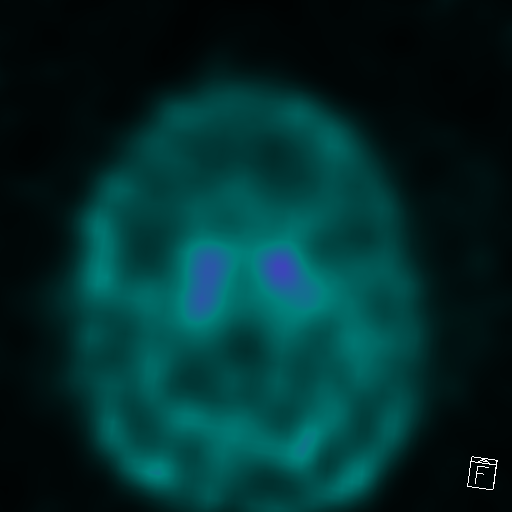
[im 22/34]
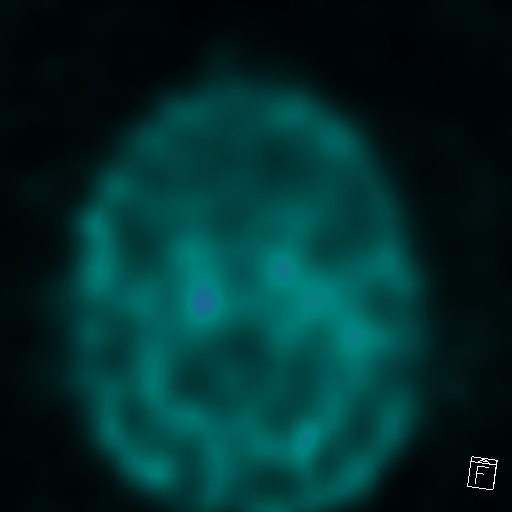
[im 25/34]
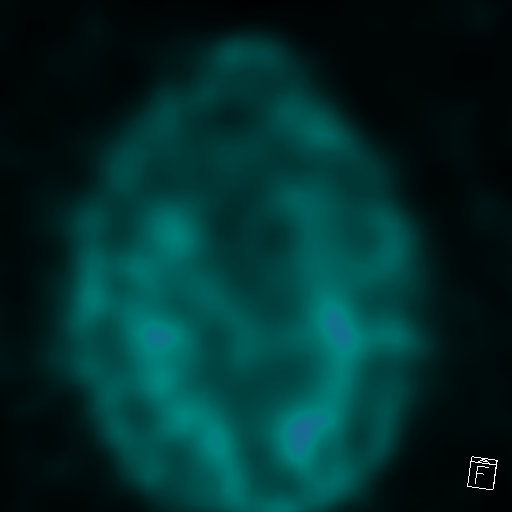
[im 26/34]
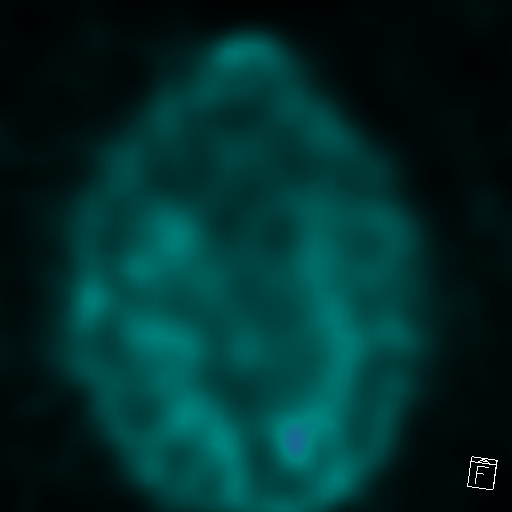
[im 28/34]
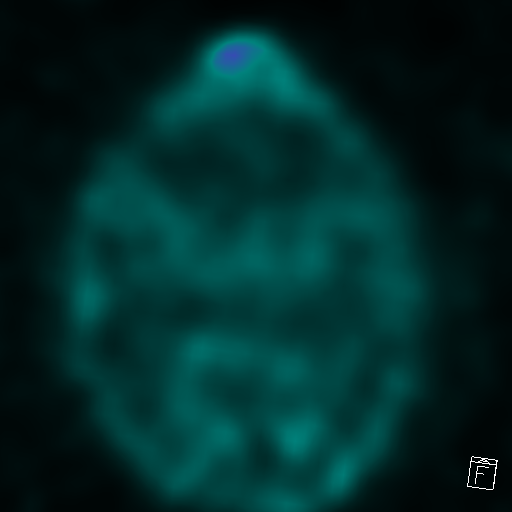
[im 29/34]
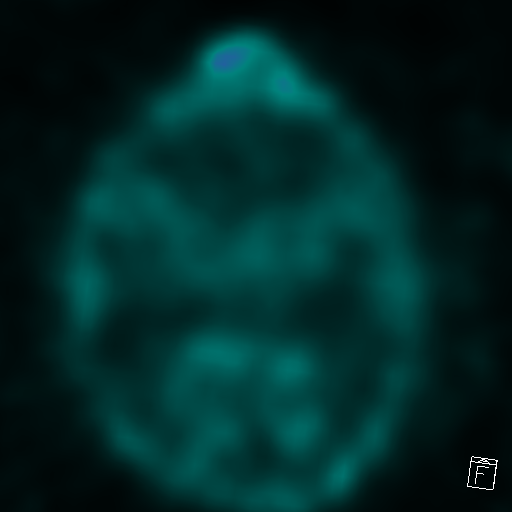
[im 32/34]
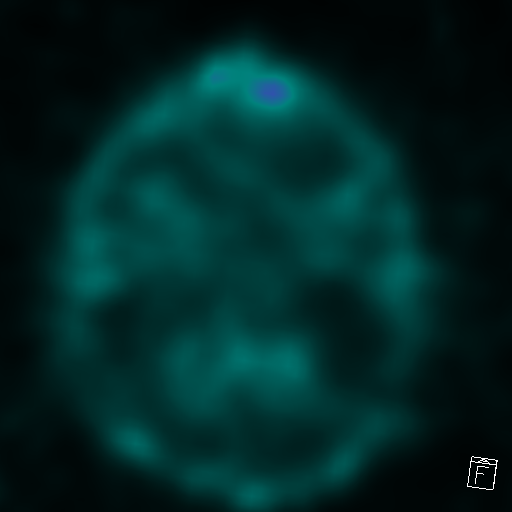
[im 34/34]
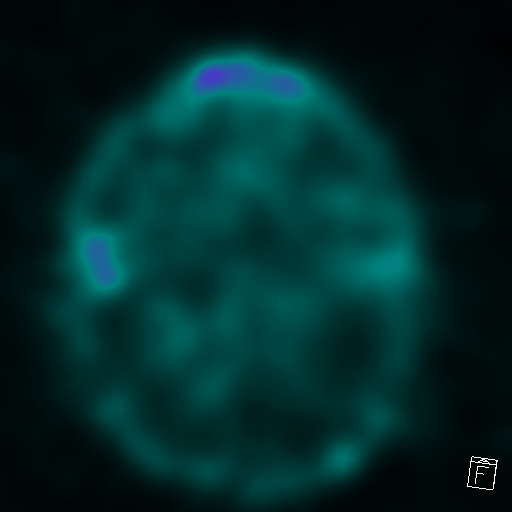

[24 of 30 positions shown; findings below may reference images not displayed]

FINDINGS: There is decreased radiotracer activity within the LEFT and RIGHT
posterior striata (putamen). There is decreased radiotracer activity
in the head of the RIGHT caudate nucleus compared to the LEFT. There
is overall decreased radiotracer activity in general within the LEFT
and RIGHT striatum.
IMPRESSION: Bilateral decreased radiotracer activity in the putamen and
asymmetric decreased radiotracer activity in the head of the RIGHT
caudate nucleus. This pattern of loss of dopamine transporter
activity is suggestive of Parkinsonian syndrome pathology.

Of note, DaTSCAN is not diagnostic of Parkinsonian syndromes, which
remains a clinical diagnosis. DaTscan is an adjuvant test to aid in
the clinical diagnosis of Parkinsonian syndromes.

## 2023-05-13 ENCOUNTER — Ambulatory Visit: Payer: PPO | Admitting: Neurology
